# Patient Record
Sex: Male | Born: 1985
Health system: Southern US, Community
[De-identification: ages and names within clinical notes are randomized; demographics above are authoritative.]

## PROBLEM LIST (undated history)

## (undated) DIAGNOSIS — R600 Localized edema: Secondary | ICD-10-CM

## (undated) DIAGNOSIS — E7800 Pure hypercholesterolemia, unspecified: Secondary | ICD-10-CM

## (undated) DIAGNOSIS — E669 Obesity, unspecified: Secondary | ICD-10-CM

## (undated) DIAGNOSIS — I10 Essential (primary) hypertension: Secondary | ICD-10-CM

## (undated) DIAGNOSIS — M109 Gout, unspecified: Secondary | ICD-10-CM

## (undated) DIAGNOSIS — G4733 Obstructive sleep apnea (adult) (pediatric): Secondary | ICD-10-CM

## (undated) DIAGNOSIS — I509 Heart failure, unspecified: Secondary | ICD-10-CM

## (undated) HISTORY — DX: Pure hypercholesterolemia, unspecified: E78.00

## (undated) HISTORY — DX: Obesity, unspecified: E66.9

## (undated) HISTORY — DX: Obstructive sleep apnea (adult) (pediatric): G47.33

## (undated) HISTORY — DX: Gout, unspecified: M10.9

## (undated) HISTORY — PX: HERNIA REPAIR: SHX51

## (undated) HISTORY — DX: Localized edema: R60.0

---
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
## 2005-03-01 ENCOUNTER — Emergency Department (HOSPITAL_COMMUNITY): Admission: EM | Admit: 2005-03-01 | Discharge: 2005-03-01 | Payer: Self-pay | Admitting: Family Medicine

## 2007-07-08 ENCOUNTER — Emergency Department (HOSPITAL_COMMUNITY): Admission: EM | Admit: 2007-07-08 | Discharge: 2007-07-08 | Payer: Self-pay | Admitting: Emergency Medicine

## 2007-10-10 ENCOUNTER — Emergency Department (HOSPITAL_COMMUNITY): Admission: EM | Admit: 2007-10-10 | Discharge: 2007-10-10 | Payer: Self-pay | Admitting: Family Medicine

## 2007-10-13 ENCOUNTER — Encounter: Admission: RE | Admit: 2007-10-13 | Discharge: 2007-10-13 | Payer: Self-pay | Admitting: General Surgery

## 2007-10-26 ENCOUNTER — Ambulatory Visit (HOSPITAL_COMMUNITY): Admission: RE | Admit: 2007-10-26 | Discharge: 2007-10-26 | Payer: Self-pay | Admitting: Urology

## 2008-07-26 ENCOUNTER — Emergency Department (HOSPITAL_COMMUNITY): Admission: EM | Admit: 2008-07-26 | Discharge: 2008-07-26 | Payer: Self-pay | Admitting: Emergency Medicine

## 2008-07-30 ENCOUNTER — Emergency Department (HOSPITAL_COMMUNITY): Admission: EM | Admit: 2008-07-30 | Discharge: 2008-07-30 | Payer: Self-pay | Admitting: Family Medicine

## 2008-08-22 ENCOUNTER — Emergency Department (HOSPITAL_COMMUNITY): Admission: EM | Admit: 2008-08-22 | Discharge: 2008-08-22 | Payer: Self-pay | Admitting: Family Medicine

## 2008-08-24 ENCOUNTER — Encounter (INDEPENDENT_AMBULATORY_CARE_PROVIDER_SITE_OTHER): Payer: Self-pay | Admitting: Adult Health

## 2008-08-24 ENCOUNTER — Ambulatory Visit: Payer: Self-pay | Admitting: Internal Medicine

## 2008-08-24 LAB — CONVERTED CEMR LAB
AST: 26 units/L (ref 0–37)
Chlamydia, Swab/Urine, PCR: NEGATIVE
Creatinine, Ser: 0.95 mg/dL (ref 0.40–1.50)
GC Probe Amp, Urine: NEGATIVE
HCT: 47 % (ref 39.0–52.0)
Hemoglobin: 15.8 g/dL (ref 13.0–17.0)
Lymphocytes Relative: 33 % (ref 12–46)
Lymphs Abs: 2.5 10*3/uL (ref 0.7–4.0)
MCHC: 33.6 g/dL (ref 30.0–36.0)
Monocytes Absolute: 0.4 10*3/uL (ref 0.1–1.0)
Neutrophils Relative %: 61 % (ref 43–77)
RBC: 5.49 M/uL (ref 4.22–5.81)
RDW: 13.2 % (ref 11.5–15.5)
Sodium: 144 meq/L (ref 135–145)
WBC: 7.5 10*3/uL (ref 4.0–10.5)

## 2009-03-03 ENCOUNTER — Emergency Department (HOSPITAL_COMMUNITY): Admission: AC | Admit: 2009-03-03 | Discharge: 2009-03-03 | Payer: Self-pay

## 2010-05-17 ENCOUNTER — Emergency Department (HOSPITAL_COMMUNITY): Admission: EM | Admit: 2010-05-17 | Discharge: 2010-05-17 | Payer: Self-pay | Admitting: Family Medicine
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
## 2011-02-10 LAB — POCT I-STAT, CHEM 8
Calcium, Ion: 1.15 mmol/L (ref 1.12–1.32)
Chloride: 109 meq/L (ref 96–112)
Glucose, Bld: 107 mg/dL — ABNORMAL HIGH (ref 70–99)
HCT: 45 % (ref 39.0–52.0)
Potassium: 3.4 meq/L — ABNORMAL LOW (ref 3.5–5.1)
Sodium: 144 meq/L (ref 135–145)

## 2011-02-10 LAB — TYPE AND SCREEN: Antibody Screen: NEGATIVE

## 2011-02-10 LAB — CBC
Hemoglobin: 14.8 g/dL (ref 13.0–17.0)
RBC: 4.88 MIL/uL (ref 4.22–5.81)
RDW: 14 % (ref 11.5–15.5)
WBC: 6.2 10*3/uL (ref 4.0–10.5)

## 2011-02-10 LAB — ABO/RH: ABO/RH(D): A POS
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
## 2011-07-31 ENCOUNTER — Inpatient Hospital Stay (INDEPENDENT_AMBULATORY_CARE_PROVIDER_SITE_OTHER)
Admission: RE | Admit: 2011-07-31 | Discharge: 2011-07-31 | Disposition: A | Discharge status: Home / Self Care | Payer: Self-pay | Source: Ambulatory Visit | Attending: Family Medicine | Admitting: Family Medicine

## 2011-07-31 ENCOUNTER — Ambulatory Visit (INDEPENDENT_AMBULATORY_CARE_PROVIDER_SITE_OTHER): Payer: Self-pay

## 2011-07-31 DIAGNOSIS — S335XXA Sprain of ligaments of lumbar spine, initial encounter: Secondary | ICD-10-CM

## 2011-08-03 LAB — GC/CHLAMYDIA PROBE AMP, GENITAL: Chlamydia, DNA Probe: NEGATIVE

## 2012-01-20 ENCOUNTER — Encounter (HOSPITAL_COMMUNITY): Payer: Self-pay | Admitting: *Deleted

## 2012-01-20 ENCOUNTER — Emergency Department (INDEPENDENT_AMBULATORY_CARE_PROVIDER_SITE_OTHER)
Admission: EM | Admit: 2012-01-20 | Discharge: 2012-01-20 | Disposition: A | Discharge status: Home / Self Care | Payer: Self-pay | Source: Home / Self Care

## 2012-01-20 DIAGNOSIS — Z202 Contact with and (suspected) exposure to infections with a predominantly sexual mode of transmission: Secondary | ICD-10-CM

## 2012-01-20 MED ORDER — CEFTRIAXONE SODIUM 250 MG IJ SOLR
INTRAMUSCULAR | Status: AC
  Filled 2012-01-20: qty 250

## 2012-01-20 MED ORDER — AZITHROMYCIN 250 MG PO TABS
ORAL_TABLET | ORAL | Status: AC
  Filled 2012-01-20: qty 4

## 2012-01-20 MED ORDER — AZITHROMYCIN 250 MG PO TABS
1000.0000 mg | ORAL_TABLET | Freq: Once | ORAL | Status: AC
  Administered 2012-01-20: 1000 mg via ORAL

## 2012-01-20 MED ORDER — CEFTRIAXONE SODIUM 250 MG IJ SOLR
250.0000 mg | Freq: Once | INTRAMUSCULAR | Status: AC
  Administered 2012-01-20: 250 mg via INTRAMUSCULAR

## 2012-01-20 NOTE — ED Notes (Signed)
Pt  States  He  May  Have  Been  Exposed to  An std  He  denys  Any  Symptoms

## 2012-01-20 NOTE — Discharge Instructions (Signed)
You will be notified if your culture results are abnormal. No sexual activity for one week. Use condoms!  Gonorrhea, Females and Males Gonorrhea is an infection. Gonorrhea can be treated with medicines that kill germs (antibiotics). It is necessary that all your sexual partners also be tested for infection and possibly be treated.  CAUSES  Gonorrhea is caused by a germ (bacteria) called Neisseria gonorrhoeae. This infection is spread by sexual contact. The contact that spreads gonorrhea from person to person may be oral, anal, or genital sex. SYMPTOMS  Females A woman may have gonorrhea infection and no symptoms. The most common symptoms are:  Pain in the lower abdomen.   Fever, with or without chills.  When these are the most serious problems, the illness is commonly called pelvic inflammatory disease (PID). Other symptoms include:  Abnormal vaginal discharge.   Painful intercourse.   Burning or itching of the vagina or lips of the vagina.   Abnormal vaginal bleeding.   Pain when urinating.  If the infection is spread by anal sex:  Irritation, pain, bleeding, or discharge from the rectum.  If the infection is spread by oral sex with either a man or a woman:  Sore throat, fever, and swollen neck lymph glands.  Other problems may include:  Long-lasting (chronic) pain in the lower abdomen during menstruation, intercourse, or at other times.   Inability to become pregnant.   Premature birth.   Passing the infection onto a newborn baby. This can cause an eye infection in the infant or more serious health problems.  Males Less frequently than in women, men may have gonorrhea infection and no symptoms. The most common symptoms are:  Discharge from the penis.   Pain or burning during urination.  If the infection is spread by anal sex:  Irritation, pain, bleeding, or discharge from the rectum.  If the infection is spread by oral sex with either a man or a woman:  Sore  throat, fever, and swollen neck lymph glands.  DIAGNOSIS  Diagnosis is made by exam of the patient and checking a sample of discharge under a microscope for the presence of the bacteria. Discharge may be taken from the urethra, cervix, throat, or rectum. TREATMENT  It is important to diagnose and treat gonorrhea as soon as possible. This prevents damage to the male or male organs or harm to the newborn baby of an infected woman.  Antibiotics are used to treat gonorrhea.   Your sex partners should also be examined and treated if needed.   Testing and treatment for other sexually transmitted diseases (STDs) may be done when you are diagnosed with gonorrhea. Gonorrhea is an STD. You are at risk for other STDs, which are often transmitted around the same time as gonorrhea. These include:   Chlamydia.   Syphilis.   Trichomonas.   Human papillomavirus (HPV).   Human immunodeficiency virus (HIV).   If left untreated, PID can cause women to be unable to have children (sterile). To prevent sterility in females, it is important to be treated as soon as possible and finish all medicines. Unfortunately, sterility or pregnancy occurring outside the uterus (ectopic) may still occur in fully treated women.  HOME CARE INSTRUCTIONS   Finish all medicine as prescribed. Incomplete treatment will put you at risk for continued infection.   Only take over-the-counter or prescription medicines for pain, discomfort, or fever as directed by your caregiver.   Do not have sex until treatment is completed, or as instructed by  your caregiver.   Follow up with your caregiver as directed.   If you test positive for gonorrhea, inform your recent sexual partners. They may need an exam and treatment, even if they have no symptoms. They may need treatment even if they test negative for gonorrhea.  Finding out the results of your test Not all test results are available during your visit. If your test results are  not back during the visit, make an appointment with your caregiver to find out the results. Do not assume everything is normal if you have not heard from your caregiver or the medical facility. It is important for you to follow up on all of your test results. SEEK MEDICAL CARE IF:   You develop any bad reaction to the medicine you were prescribed. This may include:   Rash.   Nausea.   Vomiting.   Diarrhea.   You have an oral temperature above 102 F (38.9 C).   You have symptoms that do not improve, symptoms that get worse, or you develop increased pain. Males may get pain in the testicles and females may get increased abdominal pain.  MAKE SURE YOU:   Understand these instructions.   Will watch your condition.   Will get help right away if you are not doing well or get worse.  Document Released: 10/16/2000 Document Revised: 10/08/2011 Document Reviewed: 02/18/2010 [facility] Patient Information 2012 [place], [place].

## 2012-01-20 NOTE — ED Provider Notes (Signed)
Medical screening examination/treatment/procedure(s) were performed by non-physician practitioner and as supervising physician I was immediately available for consultation/collaboration.   MORENO-COLL,Jahmez Bily; MD   Shailynn Fong Moreno-Coll, MD 01/20/12 2151 

## 2012-01-20 NOTE — ED Provider Notes (Signed)
History     CSN: [id number]  Arrival date & time 01/20/12  1546   None     Chief Complaint  Patient presents with  . Exposure to STD    (Consider location/radiation/quality/duration/timing/severity/associated sxs/prior treatment) HPI Comments: Pt states his girlfriend was treated today by her physician for gonorrhea infection. He presents this evening requesting treatment for gonorrhea also. He denies penile discharge, dysuria or genital lesions. He also denies new sexual partners.    History reviewed. No pertinent past medical history.  History reviewed. No pertinent past surgical history.  History reviewed. No pertinent family history.  History  Substance Use Topics  . Smoking status: Not on file  . Smokeless tobacco: Not on file  . Alcohol Use: Not on file      Review of Systems  Constitutional: Negative for fever.  Genitourinary: Negative for dysuria, discharge, scrotal swelling, genital sores, penile pain and testicular pain.    Allergies  Review of patient's allergies indicates not on file.  Home Medications  No current outpatient prescriptions on file.  BP 151/96  Pulse 90  Temp(Src) 97.1 F (36.2 C) (Oral)  Resp 16  SpO2 96%  Physical Exam  Nursing note and vitals reviewed. Constitutional: He appears well-developed and well-nourished. No distress.  Cardiovascular: Normal rate, regular rhythm and normal heart sounds.   Pulmonary/Chest: Effort normal and breath sounds normal. No respiratory distress.  Genitourinary: Testes normal and penis normal.  Lymphadenopathy:       Right: No inguinal adenopathy present.       Left: No inguinal adenopathy present.  Neurological: He is alert.  Skin: Skin is warm and dry.  Psychiatric: He has a normal mood and affect.    ED Course  Procedures (including critical care time)   Labs Reviewed  GC/CHLAMYDIA PROBE AMP, GENITAL   No results found.   1. Exposure to STD       MDM  Pt reported gonorrhea  exposure. Cx obtained. Treated today based on hx.         Melody Comas, [place] 01/20/12 [phone]

## 2012-12-02 ENCOUNTER — Emergency Department (HOSPITAL_COMMUNITY)
Admission: EM | Admit: 2012-12-02 | Discharge: 2012-12-02 | Disposition: A | Discharge status: Home / Self Care | Payer: Self-pay | Attending: Emergency Medicine | Admitting: Emergency Medicine

## 2012-12-02 ENCOUNTER — Encounter (HOSPITAL_COMMUNITY): Payer: Self-pay | Admitting: Cardiology

## 2012-12-02 DIAGNOSIS — J3489 Other specified disorders of nose and nasal sinuses: Secondary | ICD-10-CM | POA: Insufficient documentation

## 2012-12-02 DIAGNOSIS — R059 Cough, unspecified: Secondary | ICD-10-CM | POA: Insufficient documentation

## 2012-12-02 DIAGNOSIS — J019 Acute sinusitis, unspecified: Secondary | ICD-10-CM | POA: Insufficient documentation

## 2012-12-02 DIAGNOSIS — R42 Dizziness and giddiness: Secondary | ICD-10-CM | POA: Insufficient documentation

## 2012-12-02 DIAGNOSIS — R05 Cough: Secondary | ICD-10-CM | POA: Insufficient documentation

## 2012-12-02 MED ORDER — AMOXICILLIN 875 MG PO TABS: 875.0000 mg | ORAL_TABLET | Freq: Two times a day (BID) | ORAL | Status: DC

## 2012-12-02 NOTE — ED Provider Notes (Signed)
History     CSN: [id number]  Arrival date & time 12/02/12  1348   First MD Initiated Contact with Patient 12/02/12 1402      Chief Complaint  Patient presents with  . Headache  . Dizziness    (Consider location/radiation/quality/duration/timing/severity/associated sxs/prior treatment) HPI Comments: Patient presents today with a chief complaint of headache.  He reports that his headache is located on the right side of his forehead.  Onset of headache was last evening and has been constant since that time.  Onset of headache was gradual.  He reports that he has had nasal congestion, cough, and rhinorrhea associated with the headache.  He denies fever or chills.  Denies vision changes.  No nausea or vomiting.  No photophobia.  No neck pain or stiffness.  He reports that this morning he did have an episode of vertigo, which lasted approximately 2-3 minutes and then resolved.  No dizziness, lightheadedness, or vertigo at this time.  He attempted to take Tylenol Cold and Flu for his symptoms, but does not feel that it helped.  He denies any head trauma.  Patient is a 27 y.o. male presenting with headaches. The history is provided by the patient.  Headache  The quality of the pain is described as dull. The pain does not radiate. Pertinent negatives include no fever, no near-syncope, no syncope, no shortness of breath, no nausea and no vomiting. Treatments tried: Tylenol Cold and Flu. The treatment provided mild relief.    History reviewed. No pertinent past medical history.  Past Surgical History  Procedure Date  . Hernia repair     History reviewed. No pertinent family history.  History  Substance Use Topics  . Smoking status: Never Smoker   . Smokeless tobacco: Not on file  . Alcohol Use: No      Review of Systems  Constitutional: Negative for fever and chills.  HENT: Positive for congestion, rhinorrhea and sinus pressure. Negative for sore throat, neck pain and neck stiffness.    Eyes: Negative for photophobia, pain and visual disturbance.  Respiratory: Positive for cough. Negative for shortness of breath.   Cardiovascular: Negative for chest pain, syncope and near-syncope.  Gastrointestinal: Negative for nausea and vomiting.  Genitourinary: Negative for decreased urine volume.  Neurological: Positive for headaches.  All other systems reviewed and are negative.    Allergies  Review of patient's allergies indicates no known allergies.  Home Medications  No current outpatient prescriptions on file.  BP 153/106  Pulse 107  Temp 97.9 F (36.6 C) (Oral)  Resp 21  SpO2 100%  Physical Exam  Nursing note and vitals reviewed. Constitutional: He is oriented to person, place, and time. He appears well-developed and well-nourished. No distress.  HENT:  Head: Normocephalic and atraumatic.  Right Ear: Tympanic membrane and ear canal normal.  Left Ear: Tympanic membrane and ear canal normal.  Nose: Mucosal edema and rhinorrhea present. Right sinus exhibits frontal sinus tenderness. Right sinus exhibits no maxillary sinus tenderness. Left sinus exhibits no maxillary sinus tenderness and no frontal sinus tenderness.  Mouth/Throat: Uvula is midline, oropharynx is clear and moist and mucous membranes are normal.  Eyes: EOM are normal. Pupils are equal, round, and reactive to light.  Neck: Normal range of motion. Neck supple.  Cardiovascular: Normal rate, regular rhythm and normal heart sounds.   Pulmonary/Chest: Effort normal and breath sounds normal.  Musculoskeletal: Normal range of motion.  Neurological: He is alert and oriented to person, place, and time. He has normal  strength. No cranial nerve deficit or sensory deficit. Coordination and gait normal.       No pronator drift Normal gait, no ataxia Normal finger to nose testing Normal rapid alternating movements.  Skin: Skin is warm and dry. No rash noted. He is not diaphoretic.  Psychiatric: He has a normal  mood and affect.    ED Course  Procedures (including critical care time)  Labs Reviewed - No data to display No results found.   No diagnosis found.  Patient discussed with Dr. Jeraldine Loots.  MDM  Presentation is consistent with Acute Sinusitis and non concerning for [facility], [facility], Meningitis, or temporal arteritis. Pt is afebrile with no focal neuro deficits, nuchal rigidity, or change in vision. Pt given Rx for antibiotic to treat for Sinusitis.  Patient is hypertensive.  Review of the chart shows that the patient has been hypertensive in the past.  Patient has taken a decongestant twice today, which may be increasing his blood pressure further.  Patient instructed to follow up with PCP to have blood pressure rechecked and to determine if he needs to be on antihypertensive medication.  Pt verbalizes understanding and is agreeable with plan to dc.  Strict return precautions given to the patient.          Pascal Lux Parmelee, PA-C 12/03/12 1041

## 2012-12-02 NOTE — ED Notes (Signed)
Pt reports he woke up this morning with a headache and dizziness. Reports he feels like the room is spinning. Reports headache is generalized. No neuro deficits noted at triage. Pt A&Ox4.

## 2012-12-03 NOTE — ED Provider Notes (Signed)
Medical screening examination/treatment/procedure(s) were performed by non-physician practitioner and as supervising physician I was immediately available for consultation/collaboration.   Gerhard Munch, MD 12/03/12 [phone]

## 2013-01-10 ENCOUNTER — Encounter (HOSPITAL_COMMUNITY): Payer: Self-pay | Admitting: Emergency Medicine

## 2013-01-10 ENCOUNTER — Emergency Department (HOSPITAL_COMMUNITY)
Admission: EM | Admit: 2013-01-10 | Discharge: 2013-01-10 | Disposition: A | Discharge status: Home / Self Care | Payer: Self-pay | Attending: Emergency Medicine | Admitting: Emergency Medicine

## 2013-01-10 ENCOUNTER — Emergency Department (HOSPITAL_COMMUNITY)
Admission: EM | Admit: 2013-01-10 | Discharge: 2013-01-10 | Discharge status: Against Medical Advice | Payer: No Typology Code available for payment source | Attending: Emergency Medicine | Admitting: Emergency Medicine

## 2013-01-10 ENCOUNTER — Encounter (HOSPITAL_COMMUNITY): Payer: Self-pay | Admitting: Physical Medicine and Rehabilitation

## 2013-01-10 DIAGNOSIS — R51 Headache: Secondary | ICD-10-CM | POA: Insufficient documentation

## 2013-01-10 MED ORDER — HYDROCODONE-ACETAMINOPHEN 5-325 MG PO TABS: 1.0000 | ORAL_TABLET | Freq: Three times a day (TID) | ORAL | Status: DC | PRN

## 2013-01-10 NOTE — ED Notes (Signed)
[facility] ED called and patient is checking in over there at this time.

## 2013-01-10 NOTE — ED Notes (Signed)
States that he was on his way to work and starting a headache.

## 2013-01-10 NOTE — ED Provider Notes (Signed)
History  This chart was scribed for non-physician practitioner working with Hilario Quarry, MD, by Candelaria Stagers, ED Scribe. This patient was seen in room WTR6/WTR6 and the patient's care was started at 6:59 PM   CSN: [id number]  Arrival date & time 01/10/13  1657   First MD Initiated Contact with Patient 01/10/13 1831      Chief Complaint  Patient presents with  . Headache     The history is provided by the patient. No language interpreter was used.   Steven Chase is a 27 y.o. male who presents to the Emergency Department complaining of a constant throbbing headache that started earlier today while getting ready for work earlier this morning.  Pt reports the headache has improved mildly.  He denies gait problems, neck pain, vision changes, fever, chills, nausea, or vomiting.  Pt took ibuprofen about 12 hours ago with little relief.  Pt was listening to music on headphones which was audible upon examination.     History reviewed. No pertinent past medical history.  Past Surgical History  Procedure Laterality Date  . Hernia repair      No family history on file.  History  Substance Use Topics  . Smoking status: Never Smoker   . Smokeless tobacco: Not on file  . Alcohol Use: No      Review of Systems  Eyes: Negative for visual disturbance.  Musculoskeletal: Negative for gait problem.  Neurological: Positive for headaches.  All other systems reviewed and are negative.    Allergies  Review of patient's allergies indicates no known allergies.  Home Medications   Current Outpatient Rx  Name  Route  Sig  Dispense  Refill  . ibuprofen (ADVIL,MOTRIN) 200 MG tablet   Oral   Take 400 mg by mouth every 6 (six) hours as needed for pain.         Marland Kitchen HYDROcodone-acetaminophen (NORCO/VICODIN) 5-325 MG per tablet   Oral   Take 1 tablet by mouth every 8 (eight) hours as needed for pain.   6 tablet   0     BP 160/104  Pulse 104  Temp(Src) 98.6 F (37 C) (Oral)   Resp 22  SpO2 93%  Physical Exam  Nursing note and vitals reviewed. Constitutional: He is oriented to person, place, and time. He appears well-developed and well-nourished. No distress.  HENT:  Head: Normocephalic and atraumatic.  Right Ear: Tympanic membrane normal.  Left Ear: Tympanic membrane normal.  Eyes: EOM are normal. Pupils are equal, round, and reactive to light.  Neck: Normal range of motion. Neck supple. No tracheal deviation present.  Cardiovascular: Normal rate.   Pulmonary/Chest: Effort normal. No respiratory distress.  Musculoskeletal: Normal range of motion.  Lymphadenopathy:    He has no cervical adenopathy.  Neurological: He is alert and oriented to person, place, and time. He has normal strength. No cranial nerve deficit or sensory deficit. He displays a negative Romberg sign.  Skin: Skin is warm and dry.  Psychiatric: He has a normal mood and affect. His behavior is normal.    ED Course  Procedures   DIAGNOSTIC STUDIES: Oxygen Saturation is 93% on room air, normal by my interpretation.    COORDINATION OF CARE:  7:04 PM Discussed course of care with pt which includes pain medication and referral to headache clinic.  Pt understands and agrees.  Pt request a work note.      Labs Reviewed - No data to display No results found.   1. Headache  MDM  No concerning signs for meningitis, SAH, trauma, stroke. All of these etiologies were considered. Pts headache is mild and did get significantly better before coming using Ibuprofen at 7am this morning.  Pt has been advised of the symptoms that warrant their return to the ED. Patient has voiced understanding and has agreed to follow-up with the PCP or specialist.   I personally performed the services described in this documentation, which was scribed in my presence. The recorded information has been reviewed and is accurate.        Dorthula Matas, PA-C 01/12/13 [phone]

## 2013-01-10 NOTE — ED Notes (Signed)
Pt presents to department for evaluation of headache and dizziness. Onset this afternoon. Pt states he felt sharp pain in head and then became dizzy. Pt is alert and oriented x4. No neurological deficits noted. 6/10 pain at the time. No nausea/vomiting.

## 2013-01-17 NOTE — ED Provider Notes (Signed)
History/physical exam/procedure(s) were performed by non-physician practitioner and as supervising physician I was immediately available for consultation/collaboration. I have reviewed all notes and am in agreement with care and plan.   Hilario Quarry, MD 01/17/13 1048

## 2013-07-24 ENCOUNTER — Encounter (HOSPITAL_COMMUNITY): Payer: Self-pay | Admitting: *Deleted

## 2013-07-24 ENCOUNTER — Emergency Department (HOSPITAL_COMMUNITY)
Admission: EM | Admit: 2013-07-24 | Discharge: 2013-07-24 | Disposition: A | Discharge status: Home / Self Care | Payer: Self-pay | Attending: Emergency Medicine | Admitting: Emergency Medicine

## 2013-07-24 DIAGNOSIS — I10 Essential (primary) hypertension: Secondary | ICD-10-CM | POA: Insufficient documentation

## 2013-07-24 DIAGNOSIS — R0981 Nasal congestion: Secondary | ICD-10-CM

## 2013-07-24 DIAGNOSIS — R519 Headache, unspecified: Secondary | ICD-10-CM

## 2013-07-24 DIAGNOSIS — R51 Headache: Secondary | ICD-10-CM | POA: Insufficient documentation

## 2013-07-24 DIAGNOSIS — J3489 Other specified disorders of nose and nasal sinuses: Secondary | ICD-10-CM | POA: Insufficient documentation

## 2013-07-24 LAB — CBC
HCT: 41.7 % (ref 39.0–52.0)
Hemoglobin: 15.1 g/dL (ref 13.0–17.0)
MCH: 30.1 pg (ref 26.0–34.0)
MCHC: 36.2 g/dL — ABNORMAL HIGH (ref 30.0–36.0)
Platelets: 271 10*3/uL (ref 150–400)
RDW: 13 % (ref 11.5–15.5)
WBC: 10 10*3/uL (ref 4.0–10.5)

## 2013-07-24 LAB — BASIC METABOLIC PANEL
CO2: 27 mEq/L (ref 19–32)
Calcium: 9.6 mg/dL (ref 8.4–10.5)
Creatinine, Ser: 0.92 mg/dL (ref 0.50–1.35)
GFR calc non Af Amer: >90 mL/min (ref >90)
Potassium: 3.5 mEq/L (ref 3.5–5.1)

## 2013-07-24 MED ORDER — CETIRIZINE-PSEUDOEPHEDRINE ER 5-120 MG PO TB12: 1.0000 | ORAL_TABLET | Freq: Two times a day (BID) | ORAL | Status: DC | PRN

## 2013-07-24 MED ORDER — HYDROCHLOROTHIAZIDE 12.5 MG PO CAPS
12.5000 mg | ORAL_CAPSULE | Freq: Once | ORAL | Status: AC
  Administered 2013-07-24: 12.5 mg via ORAL
  Filled 2013-07-24: qty 1

## 2013-07-24 MED ORDER — AMLODIPINE BESYLATE 5 MG PO TABS: 5.0000 mg | ORAL_TABLET | Freq: Every day | ORAL | Status: DC

## 2013-07-24 MED ORDER — CETIRIZINE HCL 5 MG/5ML PO SYRP
5.0000 mg | ORAL_SOLUTION | Freq: Once | ORAL | Status: AC
  Administered 2013-07-24: 5 mg via ORAL
  Filled 2013-07-24: qty 5

## 2013-07-24 MED ORDER — HYDROCODONE-ACETAMINOPHEN 5-325 MG PO TABS
2.0000 | ORAL_TABLET | Freq: Once | ORAL | Status: AC
  Administered 2013-07-24: 2 via ORAL
  Filled 2013-07-24: qty 2

## 2013-07-24 MED ORDER — HYDROCHLOROTHIAZIDE 12.5 MG PO TABS: 12.5000 mg | ORAL_TABLET | Freq: Every day | ORAL | Status: DC

## 2013-07-24 MED ORDER — AMLODIPINE BESYLATE 5 MG PO TABS
5.0000 mg | ORAL_TABLET | Freq: Once | ORAL | Status: AC
  Administered 2013-07-24: 5 mg via ORAL
  Filled 2013-07-24: qty 1

## 2013-07-24 NOTE — ED Provider Notes (Signed)
CSN: [id number]     Arrival date & time 07/24/13  4098 History   First MD Initiated Contact with Patient 07/24/13 1003     Chief Complaint  Patient presents with  . Headache  . Nasal Congestion   (Consider location/radiation/quality/duration/timing/severity/associated sxs/prior Treatment) Patient is a 27 y.o. male presenting with headaches. The history is provided by the patient.  Headache Associated symptoms: congestion   Associated symptoms: no abdominal pain, no back pain, no dizziness, no pain, no fever, no nausea, no neck pain, no neck stiffness, no numbness, no sore throat and no vomiting   pt c/o dull frontal headache for the past day. Gradual onset. Constant. Slowly worse. No acute or abrupt change since onset. Onset at rest, not associated w any specific activity. No recent trauma or injury. Similar to prior headaches, but more persistent. Also notes nasal/sinus congestion. No puruelnt drainage or fevers. Pts pain/headache is located in region frontal sinuses. No neck pain or stiffness. No eye pain or change in vision. Speech normal. No numbness/weakness. No problems w balance or coordination. Denies hx chronic headaches or migraines, but notes occasional headache in past.      History reviewed. No pertinent past medical history. Past Surgical History  Procedure Laterality Date  . Hernia repair     No family history on file. History  Substance Use Topics  . Smoking status: Never Smoker   . Smokeless tobacco: Never Used  . Alcohol Use: No    Review of Systems  Constitutional: Negative for fever and chills.  HENT: Positive for congestion. Negative for sore throat, neck pain and neck stiffness.   Eyes: Negative for pain, redness and visual disturbance.  Respiratory: Negative for shortness of breath.   Cardiovascular: Negative for chest pain.  Gastrointestinal: Negative for nausea, vomiting and abdominal pain.  Genitourinary: Negative for flank pain.  Musculoskeletal:  Negative for back pain.  Skin: Negative for rash.  Neurological: Positive for headaches. Negative for dizziness, weakness, light-headedness and numbness.  Hematological: Does not bruise/bleed easily.  Psychiatric/Behavioral: Negative for confusion.    Allergies  Review of patient's allergies indicates no known allergies.  Home Medications   Current Outpatient Rx  Name  Route  Sig  Dispense  Refill  . HYDROcodone-acetaminophen (NORCO/VICODIN) 5-325 MG per tablet   Oral   Take 1 tablet by mouth every 8 (eight) hours as needed for pain.   6 tablet   0   . ibuprofen (ADVIL,MOTRIN) 200 MG tablet   Oral   Take 400 mg by mouth every 6 (six) hours as needed for pain.          BP 163/114  Pulse 109  Temp(Src) 97.6 F (36.4 C) (Oral)  Resp 18  SpO2 97% Physical Exam  Nursing note and vitals reviewed. Constitutional: He is oriented to person, place, and time. He appears well-developed and well-nourished. No distress.  HENT:  Head: Atraumatic.  Mouth/Throat: Oropharynx is clear and moist.  Mild nasal congestion.   No temporal or sinus tenderness.     Eyes: Conjunctivae and EOM are normal. Pupils are equal, round, and reactive to light. No scleral icterus.  Neck: Normal range of motion. Neck supple. No tracheal deviation present.  No stiffness or rigidity  Cardiovascular: Normal rate, regular rhythm, normal heart sounds and intact distal pulses.   Pulmonary/Chest: Effort normal and breath sounds normal. No accessory muscle usage. No respiratory distress.  Abdominal: Soft. He exhibits no distension. There is no tenderness.  Musculoskeletal: Normal range of motion. He  exhibits no edema and no tenderness.  Neurological: He is alert and oriented to person, place, and time. No cranial nerve deficit.  Motor intact bil. Steady gait.   Skin: Skin is warm and dry. He is not diaphoretic.  Psychiatric: He has a normal mood and affect.    ED Course  Procedures (including critical  care time)  Results for orders placed during the hospital encounter of 07/24/13  BASIC METABOLIC PANEL      Result Value Range   Sodium 135  135 - 145 mEq/L   Potassium 3.5  3.5 - 5.1 mEq/L   Chloride 98  96 - 112 mEq/L   CO2 27  19 - 32 mEq/L   Glucose, Bld 104 (*) 70 - 99 mg/dL   BUN 8  6 - 23 mg/dL   Creatinine, Ser 1.47  0.50 - 1.35 mg/dL   Calcium 9.6  8.4 - 82.9 mg/dL   GFR calc non Af Amer >90  >90 mL/min   GFR calc Af Amer >90  >90 mL/min  CBC      Result Value Range   WBC 10.0  4.0 - 10.5 K/uL   RBC 5.01  4.22 - 5.81 MIL/uL   Hemoglobin 15.1  13.0 - 17.0 g/dL   HCT 56.2  13.0 - 86.5 %   MCV 83.2  78.0 - 100.0 fL   MCH 30.1  26.0 - 34.0 pg   MCHC 36.2 (*) 30.0 - 36.0 g/dL   RDW 78.4  69.6 - 29.5 %   Platelets 271  150 - 400 K/uL     MDM  Pt hypertensive in ED. Pt denies hx same. Labs.  On review prior ED visits, pt also noted to have elevated bp then.  Pt states he did not drive here, has ride. No meds for pain pta. vicodin po.  Pt notes sinus congestion. Will give rx zyrtec d.   Given that bp not only v high today, but also on several prior occasions, will give rx, amlodipine 5 mg po daily and hctz 12.5 mg po.   Discussed importance close pcp follow up, incl for recheck bp w pt.   Recheck resting comfortably. No headache. Hr 88 rr 16, bp 170/105.       Suzi Roots, MD 07/24/13 1247

## 2013-07-24 NOTE — ED Notes (Signed)
Pt states since yesterday has had a severe headache and nasal congestion, states has taken tylenol and "percocet g" OTC medication.

## 2013-08-24 ENCOUNTER — Emergency Department (HOSPITAL_COMMUNITY): Payer: No Typology Code available for payment source

## 2013-08-24 ENCOUNTER — Encounter (HOSPITAL_COMMUNITY): Payer: Self-pay | Admitting: Emergency Medicine

## 2013-08-24 ENCOUNTER — Emergency Department (HOSPITAL_COMMUNITY)
Admission: EM | Admit: 2013-08-24 | Discharge: 2013-08-24 | Disposition: A | Discharge status: Home / Self Care | Payer: No Typology Code available for payment source | Attending: Emergency Medicine | Admitting: Emergency Medicine

## 2013-08-24 DIAGNOSIS — I10 Essential (primary) hypertension: Secondary | ICD-10-CM | POA: Insufficient documentation

## 2013-08-24 DIAGNOSIS — IMO0002 Reserved for concepts with insufficient information to code with codable children: Secondary | ICD-10-CM | POA: Insufficient documentation

## 2013-08-24 DIAGNOSIS — Y9389 Activity, other specified: Secondary | ICD-10-CM | POA: Insufficient documentation

## 2013-08-24 DIAGNOSIS — Z79899 Other long term (current) drug therapy: Secondary | ICD-10-CM | POA: Insufficient documentation

## 2013-08-24 DIAGNOSIS — M549 Dorsalgia, unspecified: Secondary | ICD-10-CM

## 2013-08-24 DIAGNOSIS — Y92410 Unspecified street and highway as the place of occurrence of the external cause: Secondary | ICD-10-CM | POA: Insufficient documentation

## 2013-08-24 HISTORY — DX: Essential (primary) hypertension: I10

## 2013-08-24 MED ORDER — METHOCARBAMOL 500 MG PO TABS: 500.0000 mg | ORAL_TABLET | Freq: Two times a day (BID) | ORAL | Status: DC

## 2013-08-24 MED ORDER — NAPROXEN 500 MG PO TABS: 500.0000 mg | ORAL_TABLET | Freq: Two times a day (BID) | ORAL | Status: DC

## 2013-08-24 NOTE — ED Provider Notes (Signed)
Medical screening examination/treatment/procedure(s) were performed by non-physician practitioner and as supervising physician I was immediately available for consultation/collaboration.  EKG Interpretation   None         Kristen N Ward, DO 08/24/13 2013 
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
## 2013-08-24 NOTE — ED Notes (Signed)
MD at bedside. 
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
## 2013-12-20 ENCOUNTER — Encounter (HOSPITAL_COMMUNITY): Payer: Self-pay | Admitting: Emergency Medicine

## 2013-12-20 ENCOUNTER — Emergency Department (HOSPITAL_COMMUNITY)
Admission: EM | Admit: 2013-12-20 | Discharge: 2013-12-20 | Disposition: A | Discharge status: Home / Self Care | Payer: No Typology Code available for payment source | Attending: Emergency Medicine | Admitting: Emergency Medicine

## 2013-12-20 DIAGNOSIS — R05 Cough: Secondary | ICD-10-CM

## 2013-12-20 DIAGNOSIS — R51 Headache: Secondary | ICD-10-CM | POA: Insufficient documentation

## 2013-12-20 DIAGNOSIS — R519 Headache, unspecified: Secondary | ICD-10-CM

## 2013-12-20 DIAGNOSIS — R0981 Nasal congestion: Secondary | ICD-10-CM

## 2013-12-20 DIAGNOSIS — Z79899 Other long term (current) drug therapy: Secondary | ICD-10-CM | POA: Insufficient documentation

## 2013-12-20 DIAGNOSIS — Z791 Long term (current) use of non-steroidal anti-inflammatories (NSAID): Secondary | ICD-10-CM | POA: Insufficient documentation

## 2013-12-20 DIAGNOSIS — R059 Cough, unspecified: Secondary | ICD-10-CM | POA: Insufficient documentation

## 2013-12-20 DIAGNOSIS — I10 Essential (primary) hypertension: Secondary | ICD-10-CM | POA: Insufficient documentation

## 2013-12-20 DIAGNOSIS — J3489 Other specified disorders of nose and nasal sinuses: Secondary | ICD-10-CM | POA: Insufficient documentation

## 2013-12-20 MED ORDER — HYDROCHLOROTHIAZIDE 12.5 MG PO CAPS
12.5000 mg | ORAL_CAPSULE | Freq: Once | ORAL | Status: AC
  Administered 2013-12-20: 12.5 mg via ORAL
  Filled 2013-12-20: qty 1

## 2013-12-20 MED ORDER — AMLODIPINE BESYLATE 5 MG PO TABS
5.0000 mg | ORAL_TABLET | Freq: Once | ORAL | Status: AC
  Administered 2013-12-20: 5 mg via ORAL
  Filled 2013-12-20: qty 1

## 2013-12-20 MED ORDER — CETIRIZINE HCL 5 MG/5ML PO SYRP
5.0000 mg | ORAL_SOLUTION | Freq: Once | ORAL | Status: AC
  Administered 2013-12-20: 5 mg via ORAL
  Filled 2013-12-20: qty 5

## 2013-12-20 MED ORDER — ACETAMINOPHEN 500 MG PO TABS
1000.0000 mg | ORAL_TABLET | Freq: Once | ORAL | Status: AC
  Administered 2013-12-20: 1000 mg via ORAL
  Filled 2013-12-20: qty 2

## 2013-12-20 MED ORDER — CETIRIZINE HCL 1 MG/ML PO SYRP: 5.0000 mg | ORAL_SOLUTION | Freq: Every day | ORAL | Status: DC

## 2013-12-20 MED ORDER — AMLODIPINE BESYLATE 5 MG PO TABS: 5.0000 mg | ORAL_TABLET | Freq: Every day | ORAL | Status: DC

## 2013-12-20 MED ORDER — SALINE SPRAY 0.65 % NA SOLN: 1.0000 | NASAL | Status: DC | PRN

## 2013-12-20 MED ORDER — HYDROCHLOROTHIAZIDE 12.5 MG PO TABS: 12.5000 mg | ORAL_TABLET | Freq: Every day | ORAL | Status: DC

## 2013-12-20 NOTE — ED Notes (Signed)
Called for patient out in lobby. No answer will try again in 5 mins.

## 2013-12-20 NOTE — ED Provider Notes (Signed)
CSN: [id number]     Arrival date & time 12/20/13  0919 History   First MD Initiated Contact with Patient 12/20/13 1007     Chief Complaint  Patient presents with  . Cough  . Headache     (Consider location/radiation/quality/duration/timing/severity/associated sxs/prior Treatment) HPI Pt is a 28yo male with hx of HTN c/o cough, congestion and headache that started yesterday. Headache is sore, on both sides of his head, feels like a typical headache, gradual in onset, 6/10.  Pt states he has been trying tylenol w/o relief. Also reports productive cough with yellow sputum that started yesterday. Denies fever, n/v/d, body aches. Denies chest pain or SOB. Denies sick contacts or recent travel. Pt states he does not have any blood pressure medication or other daily medication that he takes.    Past Medical History  Diagnosis Date  . Hypertension    Past Surgical History  Procedure Laterality Date  . Hernia repair     No family history on file. History  Substance Use Topics  . Smoking status: Never Smoker   . Smokeless tobacco: Never Used  . Alcohol Use: No    Review of Systems  Constitutional: Negative for fever and chills.  HENT: Positive for congestion. Negative for ear pain, sore throat and trouble swallowing.   Respiratory: Positive for cough. Negative for shortness of breath.   Cardiovascular: Negative for chest pain.  Gastrointestinal: Negative for nausea, vomiting, abdominal pain, diarrhea and constipation.  Neurological: Positive for headaches. Negative for dizziness and light-headedness.  All other systems reviewed and are negative.      Allergies  Review of patient's allergies indicates no known allergies.  Home Medications   Current Outpatient Rx  Name  Route  Sig  Dispense  Refill  . acetaminophen (TYLENOL) 325 MG tablet   Oral   Take 650 mg by mouth every 6 (six) hours as needed for pain.         Marland Kitchen. amLODipine (NORVASC) 5 MG tablet   Oral   Take 1  tablet (5 mg total) by mouth daily.   30 tablet   0   . cetirizine (ZYRTEC) 1 MG/ML syrup   Oral   Take 5 mLs (5 mg total) by mouth daily.   118 mL   12   . hydrochlorothiazide (HYDRODIURIL) 12.5 MG tablet   Oral   Take 1 tablet (12.5 mg total) by mouth daily.   30 tablet   0   . methocarbamol (ROBAXIN) 500 MG tablet   Oral   Take 1 tablet (500 mg total) by mouth 2 (two) times daily.   20 tablet   0   . naproxen (NAPROSYN) 500 MG tablet   Oral   Take 1 tablet (500 mg total) by mouth 2 (two) times daily.   30 tablet   0   . sodium chloride (OCEAN) 0.65 % SOLN nasal spray   Each Nare   Place 1 spray into both nostrils as needed for congestion.   1 Bottle   0    BP 166/106  Pulse 114  Temp(Src) 97.9 F (36.6 C) (Oral)  Resp 16  SpO2 98% Physical Exam  Nursing note and vitals reviewed. Constitutional: He appears well-developed and well-nourished.  HENT:  Head: Normocephalic and atraumatic.  Right Ear: Hearing, tympanic membrane, external ear and ear canal normal.  Left Ear: Hearing, tympanic membrane, external ear and ear canal normal.  Nose: Mucosal edema and rhinorrhea present.  Mouth/Throat: Uvula is midline, oropharynx is clear  and moist and mucous membranes are normal.  Eyes: Conjunctivae are normal. No scleral icterus.  Neck: Normal range of motion.  Cardiovascular: Normal rate, regular rhythm and normal heart sounds.   Pulmonary/Chest: Effort normal and breath sounds normal. No respiratory distress. He has no wheezes. He has no rales. He exhibits no tenderness.  Abdominal: Soft. Bowel sounds are normal. He exhibits no distension and no mass. There is no tenderness. There is no rebound and no guarding.  Musculoskeletal: Normal range of motion.  Neurological: He is alert.  Skin: Skin is warm and dry.    ED Course  Procedures (including critical care time) Labs Review Labs Reviewed - No data to display Imaging Review No results found.  EKG  Interpretation   None       MDM   Final diagnoses:  Headache  Cough  Nasal congestion  HTN (hypertension)    Pt presenting with cough, congestion, and headache that started yesterday.  Headache feels like a typical headache, was gradual in onset, 5/10.  Pt appears well, non-toxic, afebrile, no respiratory distress. NAD. Lungs: CTAB. Do not believe further workup needed at this time, including no CXR or labs needed.  Will tx symptomatically with zyrtec, acetaminophen, and nasal saline.   Pt also presented with elevated BP of 181/117 at triage.  Pt does have hx of HTN but does not have a PCP, states he does not have any daily medications.  According to pt's medical records, pt was given amlodipine and hdrochlorothiazide in 07/2013 but pt states he has not gotten any refills on his medication since then.  Will recheck BP before discharge and advise pt to f/u with a PCP, resource guide provided. Discussed importance of follow up with blood pressure as he should be on a daily blood pressure medication.  Rx: amlodipine and hydrochlorothiazide refilled today. Return precautions provided. Pt verbalized understanding and agreement with tx plan.   Junius Finner, PA-C 12/20/13 1036

## 2013-12-20 NOTE — Discharge Instructions (Signed)
Today you were seen for cough, congestion, and headache. You were also found to have elevated blood pressure. It is important to take all blood pressure medication as prescribed and follow up with a primary care provider next week for a recheck of your blood pressure as well as to establish care with a primary care provider for ongoing healthcare needs, including medication refills.  Refer to resource guide below to find a primary care provider.  You may use the ocean nasal spray to help clear nasal congestion as well as take zyrtec to help with congestion.    Emergency Department Resource Guide 1) Find a Doctor and Pay Out of Pocket Although you won't have to find out who is covered by your insurance plan, it is a good idea to ask around and get recommendations. You will then need to call the office and see if the doctor you have chosen will accept you as a new patient and what types of options they offer for patients who are self-pay. Some doctors offer discounts or will set up payment plans for their patients who do not have insurance, but you will need to ask so you aren't surprised when you get to your appointment.  2) Contact Your Local Health Department Not all health departments have doctors that can see patients for sick visits, but many do, so it is worth a call to see if yours does. If you don't know where your local health department is, you can check in your phone book. The CDC also has a tool to help you locate your state's health department, and many state websites also have listings of all of their local health departments.  3) Find a Walk-in Clinic If your illness is not likely to be very severe or complicated, you may want to try a walk in clinic. These are popping up all over the country in pharmacies, drugstores, and shopping centers. They're usually staffed by nurse practitioners or physician assistants that have been trained to treat common illnesses and complaints. They're usually  fairly quick and inexpensive. However, if you have serious medical issues or chronic medical problems, these are probably not your best option.  No Primary Care Doctor: - Call Health Connect at  [phone] - they can help you locate a primary care doctor that  accepts your insurance, provides certain services, etc. - Physician Referral Service- [phone]  Chronic Pain Problems: Organization         Address  Phone   Notes  [facility]  [phone] Patients need to be referred by their primary care doctor.   Medication Assistance: Organization         Address  Phone   Notes  [facility] [address] [place], [place] [address] [phone] --Must be a resident of [facility] [place] -- Must have NO insurance coverage whatsoever (no Medicaid/ Medicare, etc.) -- The pt. MUST have a primary care doctor that directs their care regularly and follows them in the community   [facility]  [phone]   [employer]  [phone]    Agencies that provide inexpensive medical care: Organization         Address  Phone                                                                            Notes  [facility]  [phone]   [facility]    [phone]   [facility] [address] [place], [place] [address] [phone]   [facility] [address] [place], [place] [phone]   [facility]anned Parenthood    [phone]   [facility]    [phone]   Community Health and [facility]  [address], [place] Phone:  [phone], Fax:  [phone] Hours of Operation:  9 am - 6 pm, M-F.  Also accepts Medicaid/Medicare and self-pay.  [facility]  [address], [place] Phone: [phone], Fax: [phone]. Hours of Operation:   8:30 am - 5:30 pm, M-F.  Also accepts Medicaid and self-pay.  [facility] [place] [address], [place] Phone: [phone]   [facility] [address] Natasha Bence [place], [place] [phone], Ext. [phone] Mondays & Thursdays: 7-9 AM.  First 15 patients are seen on a first come, first serve basis.    Medicaid-accepting [facility] Providers:  Organization         Address                                                                       Phone                               Notes  [facility] [address], [place] [phone] Also accepts self-pay patients.  [facility] [address] Laurell Josephs [place], [place]  [phone]   [facility] [address], [place] [phone]   [facility] [address] [place] [phone]   Renaye Rakers [address], [place]   [phone] Only accepts [place] patients after they have their name applied to their card.   Self-Pay (no insurance) in [facility]:   Organization         Address                                                     Phone               Notes  Sickle Cell Patients, [facility] [address] [place], [place] [phone]   [facility] [address] [place], [place] [phone]   [facility]  [address], [place] [phone]   [facility]/Dr. Osei-Bonsu  [address] [place] or [address], [place] [phone] Phone number for both [place] and [place] locations is the same.  [facility]gent [facility] and [facility] [address] [place] [phone]   [facility] [address] [place] or [address] [phone]   [facility] [address] [place] [phone],  phone; [phone], fax Sees patients 1st and 3rd Saturday of every month.  Must not qualify for public or private insurance (i.e. Medicaid, Medicare, [place] Health Choice, Veterans' Benefits)  Household income should be no more than 200% of the poverty level The clinic cannot treat you if you are pregnant or think you are pregnant  Sexually transmitted diseases are not treated at the clinic.    Dental Care: Organization         Address                                  Phone                       Notes  [facility] Department of [facility] [address] [place], [place] [phone] Accepts children up to age 39 who are enrolled in [place] or [place] Health Choice; pregnant women with a Medicaid card; and children who have applied for Medicaid or [place] Health Choice, but were declined, whose parents can pay a reduced fee at time of service.  [facility] Department of [facility]  [address], [place] [phone] Accepts children up to age 69 who are enrolled in [place] or [place] Health Choice; pregnant women with a Medicaid card; and children who have applied for Medicaid or [place] Health Choice, but were declined, whose parents can pay a reduced fee at time of service.  [facility]  [address] [place], [place] [phone] Patients are seen by appointment only. Walk-ins are not accepted. [facility] will see patients 44 years of age and older. Monday - Tuesday (8am-5pm) Most Wednesdays (8:30-5pm) $30 per visit, cash only  [facility]  [address], [facility] [phone] Patients are seen by appointment only. Walk-ins are not accepted. [facility] will see patients 45 years of age and older. One Wednesday Evening (Monthly: Volunteer Based).  $30 per visit, cash only  [employer]  [phone] for adults; Children under age 58, call Graduate Pediatric  Dentistry at [phone]. Children aged 85-14, please call [phone] to request a pediatric application.  Dental services are provided in all areas of dental care including fillings, crowns and bridges, complete and partial dentures, implants, gum treatment, root canals, and extractions. Preventive care is also provided. Treatment is provided to both adults and children. Patients are selected via a lottery and there is often a waiting list.   [facility] [address], [place]  [phone] [email or web address]   [facility] [address] [place], [place] [phone], Ext. [phone] Second and Fourth Thursday of each month, opens at 6:30 AM; Clinic ends at 9 AM.  Patients are seen on a first-come first-served basis, and a limited number are seen during each clinic.   [facility]  [address], [place], [place] [phone]   Eligibility Requirements You must have lived in [place], [place], or [place] counties for at least the last three months.   You cannot be eligible for state or federal sponsored [place] Cityhealthcare insurance, including [employer]IG[employer]eterans Administration, [place], or [employer]icare.   You generally cannot be eligible for healthcare insurance through your employer.    How to apply: Eligibility screenings are held every Tuesday and Wednesday afternoon from 1:00 pm until 4:00 pm. You do not need an appointment for the interview!  [facility] [address], [place], [place] [phone]   [facility]  [phone]   [facility]  [phone]   [facility]  [phone]

## 2013-12-20 NOTE — ED Notes (Signed)
Spoke with patient concerning the importance of a primary care provider, patient verbalized understanding.

## 2013-12-20 NOTE — Progress Notes (Signed)
[facility] provided pt with a list of primary care resources and [facility] [place] Card application to help patient establish primary care.  °

## 2013-12-20 NOTE — ED Notes (Signed)
Patient presents today with a chief complaint of productive cough with yellow sputum and frontal headache since yesterday. Patient reports taking tylenol for the headache without relief. Patient denies sore throat, shortness of breath, fevers, chills, body aches, nausea/vomiting.

## 2013-12-21 NOTE — ED Provider Notes (Signed)
Medical screening examination/treatment/procedure(s) were performed by non-physician practitioner and as supervising physician I was immediately available for consultation/collaboration.  EKG Interpretation   None         Audree Camel, MD 12/21/13 1102

## 2014-02-03 ENCOUNTER — Emergency Department (HOSPITAL_COMMUNITY): Payer: Self-pay

## 2014-02-03 ENCOUNTER — Emergency Department (HOSPITAL_COMMUNITY)
Admission: EM | Admit: 2014-02-03 | Discharge: 2014-02-03 | Disposition: A | Discharge status: Home / Self Care | Payer: Self-pay | Attending: Emergency Medicine | Admitting: Emergency Medicine

## 2014-02-03 ENCOUNTER — Encounter (HOSPITAL_COMMUNITY): Payer: Self-pay | Admitting: Emergency Medicine

## 2014-02-03 DIAGNOSIS — I10 Essential (primary) hypertension: Secondary | ICD-10-CM | POA: Insufficient documentation

## 2014-02-03 DIAGNOSIS — G479 Sleep disorder, unspecified: Secondary | ICD-10-CM | POA: Insufficient documentation

## 2014-02-03 DIAGNOSIS — R51 Headache: Secondary | ICD-10-CM | POA: Insufficient documentation

## 2014-02-03 LAB — URINALYSIS, ROUTINE W REFLEX MICROSCOPIC
Bilirubin Urine: NEGATIVE
GLUCOSE, UA: NEGATIVE mg/dL
KETONES UR: NEGATIVE mg/dL
LEUKOCYTES UA: NEGATIVE
NITRITE: NEGATIVE
PROTEIN: 30 mg/dL — AB
Specific Gravity, Urine: 1.021 (ref 1.005–1.030)
UROBILINOGEN UA: 0.2 mg/dL (ref 0.0–1.0)
pH: 7 (ref 5.0–8.0)

## 2014-02-03 LAB — RAPID URINE DRUG SCREEN, HOSP PERFORMED
Amphetamines: NOT DETECTED
BENZODIAZEPINES: NOT DETECTED
Barbiturates: NOT DETECTED
COCAINE: NOT DETECTED
OPIATES: NOT DETECTED
Tetrahydrocannabinol: NOT DETECTED

## 2014-02-03 LAB — URINE MICROSCOPIC-ADD ON

## 2014-02-03 MED ORDER — AMLODIPINE BESYLATE 10 MG PO TABS: 10.0000 mg | ORAL_TABLET | Freq: Every day | ORAL | Status: DC

## 2014-02-03 MED ORDER — HYDROCHLOROTHIAZIDE 25 MG PO TABS: 25.0000 mg | ORAL_TABLET | Freq: Every day | ORAL | Status: DC

## 2014-02-03 MED ORDER — HYDROCHLOROTHIAZIDE 25 MG PO TABS
25.0000 mg | ORAL_TABLET | Freq: Once | ORAL | Status: AC
  Administered 2014-02-03: 25 mg via ORAL
  Filled 2014-02-03: qty 1

## 2014-02-03 MED ORDER — AMLODIPINE BESYLATE 10 MG PO TABS
10.0000 mg | ORAL_TABLET | Freq: Once | ORAL | Status: AC
  Administered 2014-02-03: 10 mg via ORAL
  Filled 2014-02-03: qty 1

## 2014-02-03 NOTE — ED Provider Notes (Signed)
CSN: [id number]     Arrival date & time 02/03/14  0815 History   First MD Initiated Contact with Patient 02/03/14 [phone]     Chief Complaint  Patient presents with  . uncontrollable sleeping during the day      (Consider location/radiation/quality/duration/timing/severity/associated sxs/prior Treatment) HPI Comments: Patient is a 28 year old male with history of hypertension who presents today for evaluation of increased somnolence. He reports that over the past one to 2 months he has been sleeping more. He sleeps from 8 PM to 5 AM. He reports that he wakes approximately once during the night to urinate and initially feels rested. He drinks five-hour energy and soda during the day. He reports that this seems to help, but anytime he sits still he falls asleep. He feels like this acutely worsened on Monday. He has not been taking any medication for his blood pressure. He reports that he was given a 30 day supply which has run out. He denies headache, dizziness, lightheadedness, paresthesias, numbness, weakness, blurry vision, double vision, photophobia, nausea, vomiting, abdominal pain. Patient denies taking any other medications. He denies any drug or alcohol use.  The history is provided by the patient. No language interpreter was used.    Past Medical History  Diagnosis Date  . Hypertension    Past Surgical History  Procedure Laterality Date  . Hernia repair     No family history on file. History  Substance Use Topics  . Smoking status: Never Smoker   . Smokeless tobacco: Never Used  . Alcohol Use: No    Review of Systems  Constitutional: Negative for fever and chills.  Respiratory: Negative for shortness of breath.   Cardiovascular: Negative for chest pain.  Gastrointestinal: Negative for nausea, vomiting and abdominal pain.  Neurological: Negative for dizziness, light-headedness and headaches.  Psychiatric/Behavioral: Positive for sleep disturbance.  All other systems reviewed  and are negative.      Allergies  Review of patient's allergies indicates no known allergies.  Home Medications  No current outpatient prescriptions on file. BP 168/130  Pulse 116  Temp(Src) 97.7 F (36.5 C) (Oral)  Resp 15  SpO2 100% Physical Exam  Nursing note and vitals reviewed. Constitutional: He is oriented to person, place, and time. He appears well-developed and well-nourished. He does not appear ill. No distress.  Patient is somnolent, but easy to arouse. Patient snores loudly on exam.  HENT:  Head: Normocephalic and atraumatic.  Right Ear: External ear normal.  Left Ear: External ear normal.  Nose: Nose normal.  Eyes: Conjunctivae and EOM are normal.  Neck: Normal range of motion. No tracheal deviation present.  No nuchal rigidity or meningeal signs  Cardiovascular: Normal rate, regular rhythm, normal heart sounds, intact distal pulses and normal pulses.   Pulmonary/Chest: Effort normal and breath sounds normal. No stridor.  Abdominal: Soft. He exhibits no distension. There is no tenderness.  Musculoskeletal: Normal range of motion.  Neurological: He is alert and oriented to person, place, and time. He has normal strength. Coordination normal.  Finger-nose-finger normal. Grip strength 5 out of 5 bilaterally. No facial droop.  Skin: Skin is warm and dry. He is not diaphoretic.  Psychiatric: He has a normal mood and affect. His behavior is normal.    ED Course  Procedures (including critical care time) Labs Review Labs Reviewed  URINALYSIS, ROUTINE W REFLEX MICROSCOPIC - Abnormal; Notable for the following:    Hgb urine dipstick MODERATE (*)    Protein, ur 30 (*)  All other components within normal limits  URINE CULTURE  URINE RAPID DRUG SCREEN (HOSP PERFORMED)  URINE MICROSCOPIC-ADD ON   Imaging Review Ct Head Wo Contrast  02/03/2014   CLINICAL DATA:  Headache  EXAM: CT HEAD WITHOUT CONTRAST  TECHNIQUE: Contiguous axial images were obtained from the base  of the skull through the vertex without intravenous contrast.  COMPARISON:  None.  FINDINGS: No skull fracture is noted. There is mucosal thickening with nodular appearance bilateral maxillary sinus probable mucous retention cysts. The mastoid air cells are unremarkable.  No intracranial hemorrhage, mass effect or midline shift. No acute infarction. No hydrocephalus. The gray and white-matter differentiation is preserved. No mass lesion is noted on this unenhanced scan.  IMPRESSION: No acute intracranial abnormality. Mucosal thickening with nodular appearance bilateral maxillary sinus.   Electronically Signed   By: Natasha Mead M.D.   On: 02/03/2014 10:07     EKG Interpretation None      MDM   Final diagnoses:  Sleep disturbance  Hypertension    Patient presents emergency department for evaluation of increased somnolence. Will obtain head CT and urine drug screen. Neuro exam is without deficit. Patient has no other complaints.   Head CT without acute abnormality, UDS negative. Patient does decrease oxygen saturations when he sleeps, but I feel this is likely chronic. Patient needs a sleep study. He was given a referral [facility]. He states he will follow up. Patient was given his original hypertensive medications in the ED and an rx was written for home. He was given a referral to the Health and [facility] as well as a Facilities manager. Discussed limiting salt and decreasing caffeine intake. Discussed reasons to return to the ED immediately. Vital signs stable for discharge. Discussed case with Dr. Karma Ganja who agrees with plan. Patient / Family / Caregiver informed of clinical course, understand medical decision-making process, and agree with plan.   Mora Bellman, PA-C 02/03/14 1316

## 2014-02-03 NOTE — ED Notes (Signed)
PT reports inability to stay awake during the day with onset of symptoms 1-2 months ago. Occurs daily during conversations and friends will wake him if he falls asleep. PT has tried vitamin supplements and energy drinks to stay awake and they have not helped. PT currently sleeps 8p-5a during the week -this is a change from 2 mos ago where he slept 10-5a. PT denies HA, n/v/d. PT A&Ox4 but has fallen asleep during our conversation (easy to wake). HTN dx in January and was given BP pills which he took for 30 days. Current BP 177/124.

## 2014-02-03 NOTE — ED Notes (Signed)
PT intermittently falls asleep and his O2 sats drop from 98 down to 75-89% on 3 L [place] (irregular RR, snoring, mouth breathing when asleep)

## 2014-02-03 NOTE — ED Notes (Signed)
PT placed on 3 L [place] for sats dropping to 83% while sleeping.  Pt remains hypertensive.

## 2014-02-03 NOTE — ED Provider Notes (Signed)
Medical screening examination/treatment/procedure(s) were performed by non-physician practitioner and as supervising physician I was immediately available for consultation/collaboration.   EKG Interpretation None       Ethelda Chick, MD 02/03/14 1324

## 2014-02-03 NOTE — Discharge Instructions (Signed)
Please call the sleep center listed above to schedule and appointment as soon as possible. Additionally your blood pressure was very high today. Please take these medications as prescribed and follow up with a primary care physician. Please return to the ED with new or worsening symptoms.   Hypersomnia Hypersomnia usually brings recurrent episodes of excessive daytime sleepiness or prolonged nighttime sleep. It is different than feeling tired due to lack of or interrupted sleep at night. People with hypersomnia are compelled to nap repeatedly during the day. This is often at inappropriate times such as:  At work.  During a meal.  In conversation. These daytime naps usually provide no relief. This disorder typically affects adolescents and young adults. CAUSES  This condition may be caused by:  Another sleep disorder (such as narcolepsy or sleep apnea).  Dysfunction of the autonomic nervous system.  Drug or alcohol abuse.  A physical problem, such as:  A tumor.  Head trauma. This is damage caused by an accident.  Injury to the central nervous system.  Certain medications, or medicine withdrawal.  Medical conditions may contribute to the disorder, including:  Multiple sclerosis.  Depression.  Encephalitis.  Epilepsy.  Obesity.  Some people appear to have a genetic predisposition to this disorder. In others, there is no known cause. SYMPTOMS   Patients often have difficulty waking from a long sleep. They may feel dazed or confused.  Other symptoms may include:  Anxiety.  Increased irritation (inflammation).  Decreased energy.  Restlessness.  Slow thinking.  Slow speech.  Loss of appetite.  Hallucinations.  Memory difficulty.  Tremors, Tics.  Some patients lose the ability to function in family, social, occupational, or other settings. TREATMENT  Treatment is symptomatic in nature. Stimulants and other drugs may be used to treat this  disorder. Changes in behavior may help. For example, avoid night work and social activities that delay bed time. Changes in diet may offer some relief. Patients should avoid alcohol and caffeine. PROGNOSIS  The likely outcome (prognosis) for persons with hypersomnia depends on the cause of the disorder. The disorder itself is not life threatening. But it can have serious consequences. For example, automobile accidents can be caused by falling asleep while driving. The attacks usually continue indefinitely. Document Released: 10/09/2002 Document Revised: 01/11/2012 Document Reviewed: 09/12/2008 [name] Patient Information 2014 [place], [place].   Arterial Hypertension Arterial hypertension (high blood pressure) is a condition of elevated pressure in your blood vessels. Hypertension over a long period of time is a risk factor for strokes, heart attacks, and heart failure. It is also the leading cause of kidney (renal) failure.  CAUSES   In Adults -- Over 90% of all hypertension has no known cause. This is called essential or primary hypertension. In the other 10% of people with hypertension, the increase in blood pressure is caused by another disorder. This is called secondary hypertension. Important causes of secondary hypertension are:  Heavy alcohol use.  Obstructive sleep apnea.  Hyperaldosterosim (Conn's syndrome).  Steroid use.  Chronic kidney failure.  Hyperparathyroidism.  Medications.  Renal artery stenosis.  Pheochromocytoma.  Cushing's disease.  Coarctation of the aorta.  Scleroderma renal crisis.  Licorice (in excessive amounts).  Drugs (cocaine, methamphetamine). Your caregiver can explain any items above that apply to you.  In Children -- Secondary hypertension is more common and should always be considered.  Pregnancy -- Few women of childbearing age have high blood pressure. However, up to 10% of them develop hypertension of pregnancy. Generally, this will  not harm the woman. It may be a sign of 3 complications of pregnancy: preeclampsia, HELLP syndrome, and eclampsia. Follow up and control with medication is necessary. SYMPTOMS   This condition normally does not produce any noticeable symptoms. It is usually found during a routine exam.  Malignant hypertension is a late problem of high blood pressure. It may have the following symptoms:  Headaches.  Blurred vision.  End-organ damage (this means your kidneys, heart, lungs, and other organs are being damaged).  Stressful situations can increase the blood pressure. If a person with normal blood pressure has their blood pressure go up while being seen by their caregiver, this is often termed "white coat hypertension." Its importance is not known. It may be related with eventually developing hypertension or complications of hypertension.  Hypertension is often confused with mental tension, stress, and anxiety. DIAGNOSIS  The diagnosis is made by 3 separate blood pressure measurements. They are taken at least 1 week apart from each other. If there is organ damage from hypertension, the diagnosis may be made without repeat measurements. Hypertension is usually identified by having blood pressure readings:  Above 140/90 mmHg measured in both arms, at 3 separate times, over a couple weeks.  Over 130/80 mmHg should be considered a risk factor and may require treatment in patients with diabetes. Blood pressure readings over 120/80 mmHg are called "pre-hypertension" even in non-diabetic patients. To get a true blood pressure measurement, use the following guidelines. Be aware of the factors that can alter blood pressure readings.  Take measurements at least 1 hour after caffeine.  Take measurements 30 minutes after smoking and without any stress. This is another reason to quit smoking  it raises your blood pressure.  Use a proper cuff size. Ask your caregiver if you are not sure about your cuff  size.  Most home blood pressure cuffs are automatic. They will measure systolic and diastolic pressures. The systolic pressure is the pressure reading at the start of sounds. Diastolic pressure is the pressure at which the sounds disappear. If you are elderly, measure pressures in multiple postures. Try sitting, lying or standing.  Sit at rest for a minimum of 5 minutes before taking measurements.  You should not be on any medications like decongestants. These are found in many cold medications.  Record your blood pressure readings and review them with your caregiver. If you have hypertension:  Your caregiver may do tests to be sure you do not have secondary hypertension (see "causes" above).  Your caregiver may also look for signs of metabolic syndrome. This is also called Syndrome X or Insulin Resistance Syndrome. You may have this syndrome if you have type 2 diabetes, abdominal obesity, and abnormal blood lipids in addition to hypertension.  Your caregiver will take your medical and family history and perform a physical exam.  Diagnostic tests may include blood tests (for glucose, cholesterol, potassium, and kidney function), a urinalysis, or an EKG. Other tests may also be necessary depending on your condition. PREVENTION  There are important lifestyle issues that you can adopt to reduce your chance of developing hypertension:  Maintain a normal weight.  Limit the amount of salt (sodium) in your diet.  Exercise often.  Limit alcohol intake.  Get enough potassium in your diet. Discuss specific advice with your caregiver.  Follow a DASH diet (dietary approaches to stop hypertension). This diet is rich in fruits, vegetables, and low-fat dairy products, and avoids certain fats. PROGNOSIS  Essential hypertension cannot be cured.  Lifestyle changes and medical treatment can lower blood pressure and reduce complications. The prognosis of secondary hypertension depends on the underlying  cause. Many people whose hypertension is controlled with medicine or lifestyle changes can live a normal, healthy life.  RISKS AND COMPLICATIONS  While high blood pressure alone is not an illness, it often requires treatment due to its short- and long-term effects on many organs. Hypertension increases your risk for:  CVAs or strokes (cerebrovascular accident).  Heart failure due to chronically high blood pressure (hypertensive cardiomyopathy).  Heart attack (myocardial infarction).  Damage to the retina (hypertensive retinopathy).  Kidney failure (hypertensive nephropathy). Your caregiver can explain list items above that apply to you. Treatment of hypertension can significantly reduce the risk of complications. TREATMENT   For overweight patients, weight loss and regular exercise are recommended. Physical fitness lowers blood pressure.  Mild hypertension is usually treated with diet and exercise. A diet rich in fruits and vegetables, fat-free dairy products, and foods low in fat and salt (sodium) can help lower blood pressure. Decreasing salt intake decreases blood pressure in a 1/3 of people.  Stop smoking if you are a smoker. The steps above are highly effective in reducing blood pressure. While these actions are easy to suggest, they are difficult to achieve. Most patients with moderate or severe hypertension end up requiring medications to bring their blood pressure down to a normal level. There are several classes of medications for treatment. Blood pressure pills (antihypertensives) will lower blood pressure by their different actions. Lowering the blood pressure by 10 mmHg may decrease the risk of complications by as much as 25%. The goal of treatment is effective blood pressure control. This will reduce your risk for complications. Your caregiver will help you determine the best treatment for you according to your lifestyle. What is excellent treatment for one person, may not be for  you. HOME CARE INSTRUCTIONS   Do not smoke.  Follow the lifestyle changes outlined in the "Prevention" section.  If you are on medications, follow the directions carefully. Blood pressure medications must be taken as prescribed. Skipping doses reduces their benefit. It also puts you at risk for problems.  Follow up with your caregiver, as directed.  If you are asked to monitor your blood pressure at home, follow the guidelines in the "Diagnosis" section above. SEEK MEDICAL CARE IF:   You think you are having medication side effects.  You have recurrent headaches or lightheadedness.  You have swelling in your ankles.  You have trouble with your vision. SEEK IMMEDIATE MEDICAL CARE IF:   You have sudden onset of chest pain or pressure, difficulty breathing, or other symptoms of a heart attack.  You have a severe headache.  You have symptoms of a stroke (such as sudden weakness, difficulty speaking, difficulty walking). MAKE SURE YOU:   Understand these instructions.  Will watch your condition.  Will get help right away if you are not doing well or get worse. Document Released: 10/19/2005 Document Revised: 01/11/2012 Document Reviewed: 05/19/2007 [facility] Patient Information 2014 [place], [place].   Emergency Department Resource Guide 1) Find a Doctor and Pay Out of Pocket Although you won't have to find out who is covered by your insurance plan, it is a good idea to ask around and get recommendations. You will then need to call the office and see if the doctor you have chosen will accept you as a new patient and what types of options they offer for patients who are self-pay. Some  doctors offer discounts or will set up payment plans for their patients who do not have insurance, but you will need to ask so you aren't surprised when you get to your appointment.  2) Contact Your Local Health Department Not all health departments have doctors that can see patients for sick visits,  but many do, so it is worth a call to see if yours does. If you don't know where your local health department is, you can check in your phone book. The CDC also has a tool to help you locate your state's health department, and many state websites also have listings of all of their local health departments.  3) Find a Walk-in Clinic If your illness is not likely to be very severe or complicated, you may want to try a walk in clinic. These are popping up all over the country in pharmacies, drugstores, and shopping centers. They're usually staffed by nurse practitioners or physician assistants that have been trained to treat common illnesses and complaints. They're usually fairly quick and inexpensive. However, if you have serious medical issues or chronic medical problems, these are probably not your best option.  No Primary Care Doctor: - Call Health Connect at  [phone] - they can help you locate a primary care doctor that  accepts your insurance, provides certain services, etc. - Physician Referral Service- [phone]  Chronic Pain Problems: Organization         Address  Phone   Notes  [facility]  [phone] Patients need to be referred by their primary care doctor.   Medication Assistance: Organization         Address  Phone   Notes  [facility] Medication [facility] [address] [place], [place] [address] [phone]) [phone] --Must be a resident of [facility] [place] -- Must have NO insurance coverage whatsoever (no Medicaid/ Medicare, etc.) -- The pt. MUST have a primary care doctor that directs their care regularly and follows them in the community   [facility]ist  [phone]   [employer]  [phone]    Agencies that provide inexpensive medical care: Organization         Address  Phone   Notes  [facility]  [phone]) [phone]   [facility]    [phone]   [facility] [address] [place], [place] [address] [phone]   [facility] [address], [place] [phone]) [phone]   [facility]    [phone]) [phone]   [facility]    [phone]   [facility]  [address], [place] Phone:  [phone], Fax:  [phone] Hours of Operation:  9 am - 6 pm, M-F.  Also accepts Medicaid/Medicare and self-pay.  [name]  [address], [place] Phone: [phone], Fax: [phone]. Hours of Operation:  8:30 am - 5:30 pm, M-F.  Also accepts Medicaid and self-pay.  [facility] [place] [address], [place] Phone: [phone]) [phone]   [facility] [address], [place], [place] [phone], Ext. [phone] Mondays & Thursdays: 7-9 AM.  First 15 patients are seen on a first come, first serve basis.    Medicaid-accepting [facility] Providers:  Organization         Address  Phone   Notes  [facility] [address], [place] [phone] Also accepts self-pay patients.  [facility] [address] Laurell Josephs [place], [place]  [phone]   [facility] [address], [place] [phone]   [facility] [address], [place] [phone]   Renaye Rakers [address], [place]   [phone] Only accepts [place] Access [place] patients after they have their name applied to their card.   Self-Pay (no insurance) in [facility]:  Organization         Address  Phone   Notes  Sickle Cell Patients, [facility] [address] [place], [place] [phone]   [facility] [address] [place], [place] [phone]   [facility] [place]  [address], [place] [phone]   [facility]/Dr. Osei-Bonsu  [address]  [place] or [address], [place] [phone] Phone number for both [place] and [place] locations is the same.  [facility] [address], [place] [phone]   [facility] [address], [place] or [address] [phone]   [facility] [address] [place] [phone], phone; [phone], fax Sees patients 1st and 3rd Saturday of every month.  Must not qualify for public or private insurance (i.e. Medicaid, Medicare, [place] Health Choice, Veterans' Benefits)  Household income should be no more than 200% of the poverty level The clinic cannot treat you if you are pregnant or think you are pregnant  Sexually transmitted diseases are not treated at the clinic.    Dental Care: Organization         Address  Phone  Notes  [facility] [address] [place], [place] [phone] Accepts children up to age 31 who are enrolled in [place] or [place] Health Choice; pregnant women with a Medicaid card; and children who have applied for Medicaid or [place] Health Choice, but were declined, whose parents can pay a reduced fee at time of service.  [facility] Department of [facility]  [address], [place] [phone] Accepts children up to age 74 who are enrolled in [place] or [place] Health Choice; pregnant women with a Medicaid card; and children who have applied for Medicaid or [place] Health Choice, but were declined, whose parents can pay a reduced fee at time of service.  [facility]  [address] [place], [place] [phone] Patients are seen by appointment only. Walk-ins are not accepted. [facility] will see patients 53 years of age and older. Monday - Tuesday (8am-5pm) Most Wednesdays (8:30-5pm) $30 per visit, cash only  [facility]  [address]  Dr, [facility] [phone] Patients are seen by appointment only. Walk-ins are not accepted. [facility] will see patients 109 years of age and older. One Wednesday Evening (Monthly: Volunteer Based).  $30 per visit, cash only  [employer]  [phone] for adults; Children under age 61, call Graduate Pediatric Dentistry at [phone]. Children aged 40-14, please call [phone] to request a pediatric application.  Dental services are provided in all areas of dental care including fillings, crowns and bridges, complete and partial dentures, implants, gum treatment, root canals, and extractions. Preventive  care is also provided. Treatment is provided to both adults and children. Patients are selected via a lottery and there is often a waiting list.   [facility] [address] [place]  [phone] [email or web address]   [facility] [address] [place], [place] [phone], Ext. [phone] Second and Fourth Thursday of each month, opens at 6:30 AM; Clinic ends at 9 AM.  Patients are seen on a first-come first-served basis, and a limited number are seen during each clinic.   [facility]  [address] Ether Griffins [place], [place] [phone]   Eligibility Requirements You must have lived in [place], [place], or [place] counties for at least the last three months.   You cannot be eligible for state or federal sponsored [place], including [employer], [place], or [employer].   You generally cannot be eligible for healthcare insurance through your employer.    How to apply: Eligibility screenings are held every Tuesday and Wednesday afternoon from 1:00 pm until 4:00 pm. You do not need an appointment for the interview!  [facility] [address], [place], [place] [phone]   [facility] Health Department  [phone]   [facility] Health Department  [phone]   [facility] Health Department  [phone]    Behavioral Health Resources in the Community: Intensive Outpatient Programs Organization         Address  Phone  Notes  [facility] [address], [place], [place] [phone]   [facility] [address] [place], [place] [phone]   [facility] [address] [place], [place]  [phone]   [facility] [address],  [place], [place] [phone] or [phone]   Substance Abuse Resources Organization         Address  Phone  Notes  [facility]  [phone]   [facility]  [phone]   [facility]  [phone]   Floydene Flock  [phone]   Residential & Outpatient Substance Abuse Program  [phone]   Psychological Services Organization         Address  Phone  Notes  [facility]  [phone]   [facility]  [phone]   [facility] [address], [place] [phone] or [phone]    Mobile Crisis Teams Organization         Address  Phone  Notes  [facility], Mobile Crisis Care Unit  [phone]   [facility]  [address] [place], [place] [phone]   Doristine Locks [address] [place] [phone]    Self-Help/Support Groups Organization         Address  Phone             Notes  [facility] [place] - variety of support groups  [phone]- I7437963 Call for more information  Narcotics Anonymous (NA), [facility] [address], [employer] [place]  2 meetings at this location   Statistician         Address  Phone  Notes  [facility] [address],    [place]  [phone]   [facility]  [address] [place] [address], [place], [place] [phone]   [facility] [address] [place], [place]  [phone] Admissions: 8am-3pm M-F  [facility] [address]  [place], [place]   [facility] [address] Jadene Pierini [place], [place]   The [place].,  [place], [place]   [facility] - Intensive Outpatient [address], [place], [place]   [facility] [address]  [place], [place] [phone] or [phone]   [facility] ([facility]) [address] [place], [place] Accepts Medicaid  [facility] [place] [address],  [place] [phone] Substance Abuse/Addiction Treatment   [facility] Organization         Address  Phone  Notes  [facility]  [phone]   Domenic Schwab, PhD [address] Arlis Porta [place], [place]   [phone] or [phone]   [facility] [place], [place] [phone]   [facility] [address], [place], [place] [phone] Insurance/Medicaid/sponsorship through [facility] and Families [address] [place] [address]                                    [place], [place] [phone] [place] [address], [place] [phone]    Dr. Adele Schilder  [phone]   [facility] [place] Dept. 1) [address], [place] 2) [place] 3)  [place] [address], [place] [phone]  [phone]   [place] [phone] or [phone] (After Hours)

## 2014-02-04 LAB — URINE CULTURE: Colony Count: 3000

## 2014-07-30 ENCOUNTER — Encounter (HOSPITAL_COMMUNITY): Payer: Self-pay | Admitting: Emergency Medicine

## 2014-07-30 ENCOUNTER — Emergency Department (HOSPITAL_COMMUNITY)
Admission: EM | Admit: 2014-07-30 | Discharge: 2014-07-30 | Disposition: A | Discharge status: Home / Self Care | Payer: No Typology Code available for payment source | Attending: Emergency Medicine | Admitting: Emergency Medicine

## 2014-07-30 DIAGNOSIS — R51 Headache: Secondary | ICD-10-CM | POA: Insufficient documentation

## 2014-07-30 DIAGNOSIS — Z79899 Other long term (current) drug therapy: Secondary | ICD-10-CM | POA: Insufficient documentation

## 2014-07-30 DIAGNOSIS — I10 Essential (primary) hypertension: Secondary | ICD-10-CM

## 2014-07-30 DIAGNOSIS — R519 Headache, unspecified: Secondary | ICD-10-CM

## 2014-07-30 MED ORDER — LISINOPRIL 10 MG PO TABS
10.0000 mg | ORAL_TABLET | Freq: Once | ORAL | Status: AC
  Administered 2014-07-30: 10 mg via ORAL
  Filled 2014-07-30: qty 1

## 2014-07-30 MED ORDER — SODIUM CHLORIDE 0.9 % IV BOLUS (SEPSIS)
1000.0000 mL | Freq: Once | INTRAVENOUS | Status: AC
  Administered 2014-07-30: 1000 mL via INTRAVENOUS

## 2014-07-30 MED ORDER — METOCLOPRAMIDE HCL 5 MG/ML IJ SOLN
10.0000 mg | Freq: Once | INTRAMUSCULAR | Status: AC
Start: 2014-07-30 — End: 2014-07-30
  Administered 2014-07-30: 10 mg via INTRAVENOUS
  Filled 2014-07-30: qty 2

## 2014-07-30 MED ORDER — DIPHENHYDRAMINE HCL 50 MG/ML IJ SOLN
25.0000 mg | Freq: Once | INTRAMUSCULAR | Status: AC
  Administered 2014-07-30: 25 mg via INTRAVENOUS
  Filled 2014-07-30: qty 1

## 2014-07-30 MED ORDER — LISINOPRIL 10 MG PO TABS: 10.0000 mg | ORAL_TABLET | Freq: Every day | ORAL | Status: DC

## 2014-07-30 MED ORDER — KETOROLAC TROMETHAMINE 30 MG/ML IJ SOLN
30.0000 mg | Freq: Once | INTRAMUSCULAR | Status: AC
  Administered 2014-07-30: 30 mg via INTRAVENOUS
  Filled 2014-07-30: qty 1

## 2014-07-30 NOTE — ED Notes (Signed)
Per pt sts woke up with HA and nausea this am. Hx of same

## 2014-07-30 NOTE — ED Provider Notes (Signed)
CSN: [id number]     Arrival date & time 07/30/14  0844 History   First MD Initiated Contact with Patient 07/30/14 1144     Chief Complaint  Patient presents with  . Headache     (Consider location/radiation/quality/duration/timing/severity/associated sxs/prior Treatment) HPI Comments: The patient is a 28 year old male with uncontrolled hypertension, headaches, presents emergency room chief complaint of headache since this morning. The patient reports waking up due to a frontal headache. He reports constant pain, non-radiating. He denies aggravating or relieving symptoms.  Reports typical headaches as temporal, worsened by stress.  Denies visual disturbances, lightheadedness, paresthesias, nausea, vomiting. No pain therapy prior to arrival. He reports history of hypertension, last medication approximately one month ago. The patient denies chest pain, shortness of breath, decrease in urine output, lower tremor the edema, palpitations. NO PCP  Patient is a 28 y.o. male presenting with headaches. The history is provided by the patient. No language interpreter was used.  Headache Associated symptoms: no abdominal pain, no congestion, no fever, no nausea, no numbness, no photophobia and no vomiting     Past Medical History  Diagnosis Date  . Hypertension    Past Surgical History  Procedure Laterality Date  . Hernia repair     History reviewed. No pertinent family history. History  Substance Use Topics  . Smoking status: Never Smoker   . Smokeless tobacco: Never Used  . Alcohol Use: No    Review of Systems  Constitutional: Negative for fever and chills.  HENT: Negative for congestion.   Eyes: Negative for photophobia and visual disturbance.  Respiratory: Negative for shortness of breath.   Cardiovascular: Negative for chest pain, palpitations and leg swelling.  Gastrointestinal: Negative for nausea, vomiting and abdominal pain.  Genitourinary: Negative for decreased urine volume.   Neurological: Positive for headaches. Negative for weakness, light-headedness and numbness.      Allergies  Review of patient's allergies indicates no known allergies.  Home Medications   Prior to Admission medications   Medication Sig Start Date End Date Taking? Authorizing Provider  amLODipine (NORVASC) 10 MG tablet Take 1 tablet (10 mg total) by mouth daily. 02/03/14  Yes Hannah S Merrell, PA-C   BP 164/122  Pulse 119  Temp(Src) 98 F (36.7 C) (Oral)  Resp 24  Ht 5\' 6"  (1.676 m)  Wt 230 lb (104.327 kg)  BMI 37.14 kg/m2  SpO2 98% Physical Exam  Nursing note and vitals reviewed. Constitutional: He is oriented to person, place, and time. He appears well-developed and well-nourished. No distress.  HENT:  Head: Normocephalic and atraumatic.  Eyes: EOM are normal. Pupils are equal, round, and reactive to light.  Neck: Neck supple.  Cardiovascular: Normal rate and regular rhythm.   Pulmonary/Chest: Effort normal and breath sounds normal. He has no wheezes. He has no rales.  Abdominal: Soft. There is no tenderness. There is no rebound and no guarding.  Musculoskeletal: Normal range of motion.  Neurological: He is alert and oriented to person, place, and time.  II-Visual fields were normal.   III/IV/VI-Pupils were equal and reacted. Extraocular movements were full and conjugate.  V/VII-no facial droop.   VIII-normal.   Motor: Left extremities 5/5. Right extremities 5/5 Sensory: normal sensation to upper and lower extremities.  Cerebellar: Normal finger to nose bilaterally  Skin: Skin is warm and dry. He is not diaphoretic.  Psychiatric: He has a normal mood and affect. His behavior is normal.    ED Course  Procedures (including critical care time) Labs Review Labs  Reviewed - No data to display  Imaging Review No results found.   EKG Interpretation None      MDM   Final diagnoses:  Acute intractable headache, unspecified headache type  Hypertension,  uncontrolled   Patient history of hypertension, uncontrolled, presents with frontal headache. No meningismus signs. No neurologic deficits on exam, afebrile. Treatment with headache cocktail. Reevaluate. Discussed importance of blood pressure control and followup.  Dr. Hyacinth Meeker also evaluated the patient during exam and agrees with BP control and symptom control. Reevaluation patient resting comfortably in room reports mild resolution of symptoms, Reglan and Benadryl ordered. Case management discuss resources with patient and followup with wellness center, patient states he will schedule his own appointment with the wellness center. Reevaluation patient sleeping in room, reports moderate resolution of symptoms, requesting discharge home. Discussed treatment plan with the patient. Return precautions given. Reports understanding and no other concerns at this time.  Patient is stable for discharge at this time. Meds given in ED:  Medications  sodium chloride 0.9 % bolus 1,000 mL (1,000 mLs Intravenous New Bag/Given 07/30/14 1234)  lisinopril (PRINIVIL,ZESTRIL) tablet 10 mg (10 mg Oral Given 07/30/14 1235)  ketorolac (TORADOL) 30 MG/ML injection 30 mg (30 mg Intravenous Given 07/30/14 1235)  metoCLOPramide (REGLAN) injection 10 mg (10 mg Intravenous Given 07/30/14 1324)  diphenhydrAMINE (BENADRYL) injection 25 mg (25 mg Intravenous Given 07/30/14 1324)    New Prescriptions   LISINOPRIL (PRINIVIL,ZESTRIL) 10 MG TABLET    Take 1 tablet (10 mg total) by mouth daily.       Mellody Drown, PA-C 07/30/14 1443

## 2014-07-30 NOTE — ED Provider Notes (Signed)
28 year old male, history of hypertension, no medications in over one month, was being treated with Norvasc but states he did not have any refills and the medication was extensive. He complains of a headache which is frontal in location, awoke with this headache this morning, states it is somewhat similar to prior headaches and is not the worst headache of his life. He has no other associated pathologic symptoms including no fevers, chills, stiff neck, numbness, weakness or change in vision. On exam the patient has clear heart and lung sounds, pulse of 98 on my exam, normal lung sounds, soft abdomen, no peripheral edema and normal speech, normal cranial nerves III through XII, normal strength and sensation of all 4 extremities. His blood pressure is elevated, we'll change medication to lisinopril, I discussed with the patient at length the indications for its use as well as the side effects including angioedema, he is in agreement with starting this medication and is aware of the possible side effects. Pain medication for the headache.  Medical screening examination/treatment/procedure(s) were conducted as a shared visit with non-physician practitioner(s) and myself.  I personally evaluated the patient during the encounter.  Clinical Impression:   Final diagnoses:  Acute intractable headache, unspecified headache type  Hypertension, uncontrolled         Vida Roller, MD 07/30/14 [phone]

## 2014-07-30 NOTE — ED Notes (Signed)
Case Manager at bedside

## 2014-07-30 NOTE — Discharge Instructions (Signed)
Call for a follow up appointment with a Family or Primary Care Provider for further management of your blood pressure. Return if Symptoms worsen.   Take medication as prescribed.     Emergency Department Resource Guide 1) Find a Doctor and Pay Out of Pocket Although you won't have to find out who is covered by your insurance plan, it is a good idea to ask around and get recommendations. You will then need to call the office and see if the doctor you have chosen will accept you as a new patient and what types of options they offer for patients who are self-pay. Some doctors offer discounts or will set up payment plans for their patients who do not have insurance, but you will need to ask so you aren't surprised when you get to your appointment.  2) Contact Your Local Health Department Not all health departments have doctors that can see patients for sick visits, but many do, so it is worth a call to see if yours does. If you don't know where your local health department is, you can check in your phone book. The CDC also has a tool to help you locate your state's health department, and many state websites also have listings of all of their local health departments.  3) Find a Walk-in Clinic If your illness is not likely to be very severe or complicated, you may want to try a walk in clinic. These are popping up all over the country in pharmacies, drugstores, and shopping centers. They're usually staffed by nurse practitioners or physician assistants that have been trained to treat common illnesses and complaints. They're usually fairly quick and inexpensive. However, if you have serious medical issues or chronic medical problems, these are probably not your best option.  No Primary Care Doctor: - Call Health Connect at  [phone] - they can help you locate a primary care doctor that  accepts your insurance, provides certain services, etc. - Physician Referral Service- [phone]  Chronic Pain  Problems: Organization         Address  Phone   Notes  [facility]  [phone] Patients need to be referred by their primary care doctor.   Medication Assistance: Organization         Address  Phone   Notes  [facility] Medication [facility] [address] [place] [place], [place] [address] [phone] --Must be a resident of [facility] -- Must have NO insurance coverage whatsoever (no Medicaid/ Medicare, etc.) -- The pt. MUST have a primary care doctor that directs their care regularly and follows them in the community   [facility]ist  [phone]   [employer]  [phone]    Agencies that provide inexpensive medical care: Organization         Address  Phone   Notes  [facility]  [phone]   [facility]    [phone]   [facility] [address] [place], [place] [address] [phone]   [facility] [address] [place] [place] [phone]   Planned Parenthood    [phone]   [facility]    [phone]   [facility]  [address], [place] Phone:  [phone], Fax:  [phone] Hours of Operation:  9 am - 6 pm, M-F.  Also accepts Medicaid/Medicare and self-pay.  [facility]  [address], [place] Phone: [phone], Fax: [phone]. Hours of Operation:  8:30 am - 5:30 pm, M-F.  Also accepts Medicaid and self-pay.  °[facility] [place] [address], [place] Phone: [phone]   °[facility] [address], [place], [place] [phone], Ext. [phone] Mondays & Thursdays: 7-9 AM.  First 15 patients are seen on a first come, first serve basis. °  ° °Medicaid-accepting [place] Providers: ° °Organization         Address  Phone   Notes  °[facility] [address], [place] [phone] Also  accepts self-pay patients.  °[facility] [address], [place] ° [phone]   °[facility] [address], [place] [phone]   °[facility] [address] [place] [address], [place] [phone]   °Veita Bland [address], [place]  ° [phone] Only accepts [place] Access Medicaid patients after they have their name applied to their card.  ° °Self-Pay (no insurance) in [place]: ° °Organization         Address  Phone   Notes  °Sickle Cell Patients, [facility] [address], [place] [phone]   °[facility] [address], [place] [phone]   °[facility] ° [address], [place] [phone]   °[facility]/Dr. Osei-Bonsu ° [address] [place] [address], [place] or [address], [place] [phone] Phone number for both [place] and [place] locations is the same.  °[facility] [address], [place] [phone]) [phone]   °[facility] [address] [place] [address], [place] or [address] [phone]) [phone] °([phone]) [phone]   °[facility] [address], [place] [phone]) [phone], phone; [phone], fax Sees patients 1st and 3rd Saturday of every month.  Must not qualify for public or private insurance (i.e. Medicaid, Medicare, [place] Health Choice, Veterans' Benefits) • Household income should be no more than 200% of the poverty level •The clinic cannot treat you if you are pregnant or think you are pregnant • Sexually transmitted diseases are not treated at the clinic.  ° ° °Dental Care: °Organization         Address  Phone  Notes  °[place] [facility] [address], [place] [phone]) [phone] Accepts children up to age 21 who are enrolled in Medicaid or [place] Health Choice; pregnant  women with a Medicaid card; and children who have applied for Medicaid or [place] Health Choice, but were declined, whose parents can pay a reduced fee at time of service.  °[place] [facility] [place]  [address], [place] [phone]) [phone] Accepts children up to age 21 who are enrolled in Medicaid or [place] Health Choice; pregnant women with a Medicaid card; and children who have applied for Medicaid or  Health Choice, but were declined, whose parents can pay a reduced fee at time of service.  °[facility] ° [address],  [phone] Patients are seen by appointment only. Walk-ins are not accepted. [facility] will see patients 18 years of age and older. °Monday - Tuesday (8am-5pm) °Most Wednesdays (8:30-5pm) °$30 per visit, cash only  °[facility]  [address], [facility] [phone] Patients are seen by appointment only. Walk-ins are not accepted. [place] will see patients 60 years of age and older. One Wednesday Evening (Monthly: Volunteer Based).  $30 per visit, cash only  [place]  [phone] for adults; Children under age 4, call Graduate Pediatric Dentistry at [phone]. Children aged 49-14, please call [phone] to request a pediatric application.  Dental services are provided in all areas of dental care including fillings, crowns and bridges, complete and partial dentures, implants, gum treatment, root canals, and extractions. Preventive care is also provided. Treatment is provided to both adults and children. Patients are selected via a lottery and there is often a waiting list.   [facility] [address] [place]  [phone] [email or web address]   [facility] [address] [place], [place] [phone], Ext. [phone] Second and Fourth Thursday of each month, opens at 6:30 AM; Clinic ends at 9 AM.  Patients are  seen on a first-come first-served basis, and a limited number are seen during each clinic.   [facility]  [address] Hillard Danker [place], [place] [phone]   Eligibility Requirements You must have lived in [place], [place], or [place] counties for at least the last three months.   You cannot be eligible for state or federal sponsored [employer], including [employer], [place], or [employer].   You generally cannot be eligible for healthcare insurance through your employer.    How to apply: Eligibility screenings are held every Tuesday and Wednesday afternoon from 1:00 pm until 4:00 pm. You do not need an appointment for the interview!  [facility] [address] [place], [place]   [place] Department  [place]  [phone]    Behavioral Health Resources in the Community: Intensive Outpatient Programs Organization         Address  Phone  Notes  [facility] [place]. [address], [place], [place] [phone]   [facility] Outpatient [address], [place], [place]   [facility]: [facility] [address] [place] [address], [place], [place]   [place] [address],  [place], [place] or [phone]   Substance Abuse Resources Organization         Address  Phone  Notes  [facility] Drug Services  [phone]   [place]  [phone]   [facility] [place]   Chinita Pester  [phone]   Residential & Outpatient Substance Abuse Program  [phone]   Psychological Services Organization         Address  Phone  Notes  [facility] [place]  [place]  [phone]   [place] [address] [place] [phone] or [phone]    Mobile Crisis  Teams Organization         Address  Phone  Notes  [facility], Mobile Crisis Care Unit  4045379558   [facility]  [address] [place], [place]   [facility] [address] [place] [phone]    Self-Help/Support Groups Organization         Address  Phone             Notes  [facility]  [facility] [place] - variety of support groups  [place] Call for more information  Narcotics Anonymous (NA), [facility] [address], [employer] [place]  2 meetings at this location   Special educational needs teacher         Address  Phone  Notes  [facility] [place],    [place]  [phone])[phone]   [facility]  [address], [place] [address], [place], [place]   [place], [place] [phone] Admissions: 8am-3pm M-F  Incentives Substance [place] [address],    [place], [place] [phone]   [facility] [address] [place], [place], [place]   [facility] [address]  [place], [place]   [facility] - Intensive Outpatient [address], [place], [place]   [facility] [place]  [place], [place] [phone] or [phone]   [facility] ([facility]) [address] [place], [place] Accepts Medicaid  [facility] [place] [address],  [place] [phone] Substance Abuse/Addiction Treatment   [facility] Organization         Address  Phone  Notes  [facility]  [phone]   Domenic Schwab, PhD [address] Arlis Porta [place], [place]   [phone] or [phone]   [facility], [place] [phone]   [facility] [address], [place], [place] [phone]  Insurance/Medicaid/sponsorship through [facility] and Families [address] [place] [address]                                    [place], [place] [phone] [place] [address], [place] [phone]    Dr. Adele Schilder  [phone]   [facility] Dept. 1) [address] [place] 2) [place] 3)  [place] [address], [place] [phone]  [phone]   [place] [phone] or [phone] (After Hours)

## 2014-07-30 NOTE — Progress Notes (Signed)
  CARE MANAGEMENT ED NOTE 07/30/2014  Patient:  Steven Chase, Steven Chase   Account Number:  [id number]  Date Initiated:  07/30/2014  Documentation initiated by:  Ferdinand Cava  Subjective/Objective Assessment:   28 yo male uninsured presenting to the ED with elevated BP     Subjective/Objective Assessment Detail:     Action/Plan:   Action/Plan Detail:   Patient is encouraged to schedule and appointment at the [facility] for continued care while enrolling for health insurance through his employment.   Anticipated DC Date:       Status Recommendation to Physician:   Result of Recommendation:  Agreed    DC Planning Services  CM consult  PCP issues    Choice offered to / List presented to:  C-1 Patient          Status of service:  Completed, signed off  ED Comments:   ED Comments Detail:  CM consulted due to no PCP or insurance. This CM spoke with the patient and he stated that he can enroll in Obamacare this Thursday through his employment. This CM spoke with the patient about the importance of a PCP for follow up care for continued management of his BP and continued medication prescriptions. This Cm offered to assist with scheduling an appointment at the [facility] but the patient declined and stated that he will schedule the appointment. This CM also provided the patient with the [employer] $4 list and the resource list for the uninsured in [place]. The patient verbalized understanding and had no other questions or concerns. The ED PA was made aware of information provided to the patient.

## 2014-07-30 NOTE — ED Provider Notes (Signed)
Medical screening examination/treatment/procedure(s) were conducted as a shared visit with non-physician practitioner(s) and myself.  I personally evaluated the patient during the encounter  Please see my separate respective documentation pertaining to this patient encounter   Vida Roller, MD 07/30/14 [phone]

## 2014-07-30 NOTE — ED Notes (Signed)
Pt in NAD, pt A&OX4. Pt ambulatory on discharge. IV taken out. Pt given prescription for blood pressure and reviewed discharge instructions.

## 2014-11-05 ENCOUNTER — Encounter: Payer: Self-pay | Admitting: Family

## 2014-11-05 ENCOUNTER — Ambulatory Visit (INDEPENDENT_AMBULATORY_CARE_PROVIDER_SITE_OTHER): Payer: 59 | Admitting: Family

## 2014-11-05 VITALS — BP 198/162 | HR 112 | Temp 98.0°F | Resp 18 | Ht 66.0 in | Wt 236.0 lb

## 2014-11-05 DIAGNOSIS — I10 Essential (primary) hypertension: Secondary | ICD-10-CM

## 2014-11-05 DIAGNOSIS — G4733 Obstructive sleep apnea (adult) (pediatric): Secondary | ICD-10-CM

## 2014-11-05 DIAGNOSIS — R0683 Snoring: Secondary | ICD-10-CM

## 2014-11-05 HISTORY — DX: Obstructive sleep apnea (adult) (pediatric): G47.33

## 2014-11-05 MED ORDER — AMLODIPINE BESYLATE 10 MG PO TABS: 10.0000 mg | ORAL_TABLET | Freq: Every day | ORAL | Status: DC

## 2014-11-05 NOTE — Progress Notes (Signed)
   Subjective:    Patient ID: Steven Chase, male    DOB: [birth year], 29 y.o.   MRN: [id number]  Chief Complaint  Patient presents with  . Establish Care    hypertension, mom says he sleeps really loud like hes out of breath, would like to be referred to sleep study    HPI:  Steven Chase is a 29 y.o. male who presents today to establish care and discuss his hypertension and snoring.  1) Hypertension - Not currently taking any medication. Due for an eye exam as he does not remember the last time he was seen.   BP Readings from Last 3 Encounters:  11/05/14 198/162  07/30/14 150/84  02/03/14 168/130     2) Snoring - Indicates that he snores at night which was recorded by his mother. She is concerned about apnea during his sleep. Patient indicates that this has been going on for a while. Has never had a sleep study performed. Describes feelings of fatigue, tiredness, sleepiness throughout the day. Denies having tried any treatments or using any other modifying factors.   No Known Allergies   No current outpatient prescriptions on file.   No current facility-administered medications for this visit.    Past Medical History  Diagnosis Date  . Hypertension     Family History  Problem Relation Age of Onset  . Hypertension Mother   . Heart disease Maternal Grandfather   . Hypertension Maternal Grandfather     History   Social History  . Marital Status: Single    Spouse Name: N/A    Number of Children: 1  . Years of Education: 12   Occupational History  . [employer]    Social History Main Topics  . Smoking status: Never Smoker   . Smokeless tobacco: Never Used  . Alcohol Use: No  . Drug Use: No  . Sexual Activity: Not on file   Other Topics Concern  . Not on file   Social History Narrative   Born and raised in [place], [place]. Currently live in a private residence with his mother. Fun: Play with his daughter.   Denies religious beliefs that would effect  health care.     Review of Systems  Constitutional: Positive for fatigue.  Eyes:       Denies changes in vision  Respiratory: Negative for chest tightness and shortness of breath.   Cardiovascular: Negative for chest pain and palpitations.  Neurological: Negative for headaches.      Objective:    BP 198/162 mmHg  Pulse 112  Temp(Src) 98 F (36.7 C) (Oral)  Resp 18  Ht 5\' 6"  (1.676 m)  Wt 236 lb (107.049 kg)  BMI 38.11 kg/m2  SpO2 97% Nursing note and vital signs reviewed.  Physical Exam  Constitutional: He is oriented to person, place, and time. He appears well-developed and well-nourished. No distress.  Obese male seated in the chair, dressed appropriately and appears his stated age.   Cardiovascular: Normal rate, regular rhythm, normal heart sounds and intact distal pulses.   Pulmonary/Chest: Effort normal and breath sounds normal.  Neurological: He is alert and oriented to person, place, and time.  Skin: Skin is warm and dry.  Psychiatric: He has a normal mood and affect. His behavior is normal. Judgment and thought content normal.       Assessment & Plan:

## 2014-11-05 NOTE — Assessment & Plan Note (Signed)
Patient indicates snoring at night and feelings of fatigue and tiredness during the day. Schedule sleep study. Discussed with patient importance of losing weight and the benefits that it may have with sleep and rest. Recommend losing 5-10% of his body weight by increasing fruits and vegetables and decreasing saturated fats. Also, increase physical activity 30 minutes most days of the week. Follow up after sleep study.

## 2014-11-05 NOTE — Patient Instructions (Signed)
Thank you for choosing [facility].  Summary/Instructions:  Your prescription(s) have been submitted to your pharmacy or been printed and provided for you. Please take as directed and contact our office if you believe you are having problem(s) with the medication(s) or have any questions.  If your symptoms worsen or fail to improve, please contact our office for further instruction, or in case of emergency go directly to the emergency room at the closest medical facility.   Hypertension Hypertension, commonly called high blood pressure, is when the force of blood pumping through your arteries is too strong. Your arteries are the blood vessels that carry blood from your heart throughout your body. A blood pressure reading consists of a higher number over a lower number, such as 110/72. The higher number (systolic) is the pressure inside your arteries when your heart pumps. The lower number (diastolic) is the pressure inside your arteries when your heart relaxes. Ideally you want your blood pressure below 120/80. Hypertension forces your heart to work harder to pump blood. Your arteries may become narrow or stiff. Having hypertension puts you at risk for heart disease, stroke, and other problems.  RISK FACTORS Some risk factors for high blood pressure are controllable. Others are not.  Risk factors you cannot control include:   Race. You may be at higher risk if you are African American.  Age. Risk increases with age.  Gender. Men are at higher risk than women before age 45 years. After age 65, women are at higher risk than men. Risk factors you can control include:  Not getting enough exercise or physical activity.  Being overweight.  Getting too much fat, sugar, calories, or salt in your diet.  Drinking too much alcohol. SIGNS AND SYMPTOMS Hypertension does not usually cause signs or symptoms. Extremely high blood pressure (hypertensive crisis) may cause headache, anxiety,  shortness of breath, and nosebleed. DIAGNOSIS  To check if you have hypertension, your health care provider will measure your blood pressure while you are seated, with your arm held at the level of your heart. It should be measured at least twice using the same arm. Certain conditions can cause a difference in blood pressure between your right and left arms. A blood pressure reading that is higher than normal on one occasion does not mean that you need treatment. If one blood pressure reading is high, ask your health care provider about having it checked again. TREATMENT  Treating high blood pressure includes making lifestyle changes and possibly taking medicine. Living a healthy lifestyle can help lower high blood pressure. You may need to change some of your habits. Lifestyle changes may include:  Following the DASH diet. This diet is high in fruits, vegetables, and whole grains. It is low in salt, red meat, and added sugars.  Getting at least 2 hours of brisk physical activity every week.  Losing weight if necessary.  Not smoking.  Limiting alcoholic beverages.  Learning ways to reduce stress. If lifestyle changes are not enough to get your blood pressure under control, your health care provider may prescribe medicine. You may need to take more than one. Work closely with your health care provider to understand the risks and benefits. HOME CARE INSTRUCTIONS  Have your blood pressure rechecked as directed by your health care provider.   Take medicines only as directed by your health care provider. Follow the directions carefully. Blood pressure medicines must be taken as prescribed. The medicine does not work as well when you skip doses.   Skipping doses also puts you at risk for problems.   Do not smoke.   Monitor your blood pressure at home as directed by your health care provider. SEEK MEDICAL CARE IF:   You think you are having a reaction to medicines taken.  You have  recurrent headaches or feel dizzy.  You have swelling in your ankles.  You have trouble with your vision. SEEK IMMEDIATE MEDICAL CARE IF:  You develop a severe headache or confusion.  You have unusual weakness, numbness, or feel faint.  You have severe chest or abdominal pain.  You vomit repeatedly.  You have trouble breathing. MAKE SURE YOU:   Understand these instructions.  Will watch your condition.  Will get help right away if you are not doing well or get worse. Document Released: 10/19/2005 Document Revised: 03/05/2014 Document Reviewed: 08/11/2013 ExitCare Patient Information 2015 [employer]. This information is not intended to replace advice given to you by your health care provider. Make sure you discuss any questions you have with your health care provider.   

## 2014-11-05 NOTE — Progress Notes (Signed)
Pre visit review using our clinic review tool, if applicable. No additional management support is needed unless otherwise documented below in the visit note. 

## 2014-11-05 NOTE — Assessment & Plan Note (Signed)
Hypertension remains uncontrolled. Patient has not taken any medications in the last 30 days. Start amlodipine 10 mg daily. We will need eye exam. Obtain blood work at next visit. Follow up in 2 weeks.

## 2014-11-06 ENCOUNTER — Telehealth: Payer: Self-pay | Admitting: Family

## 2014-11-06 NOTE — Telephone Encounter (Signed)
emmi mailed  °

## 2014-11-19 ENCOUNTER — Other Ambulatory Visit: Payer: Self-pay | Admitting: Family

## 2014-11-19 ENCOUNTER — Ambulatory Visit (INDEPENDENT_AMBULATORY_CARE_PROVIDER_SITE_OTHER): Payer: 59

## 2014-11-19 VITALS — BP 178/118 | HR 113

## 2014-11-19 DIAGNOSIS — Z23 Encounter for immunization: Secondary | ICD-10-CM

## 2014-11-19 MED ORDER — CHLORTHALIDONE 25 MG PO TABS: 25.0000 mg | ORAL_TABLET | Freq: Every day | ORAL | Status: DC

## 2014-12-11 ENCOUNTER — Ambulatory Visit: Payer: 59 | Admitting: Family

## 2014-12-12 ENCOUNTER — Ambulatory Visit (INDEPENDENT_AMBULATORY_CARE_PROVIDER_SITE_OTHER): Payer: 59 | Admitting: Family

## 2014-12-12 ENCOUNTER — Other Ambulatory Visit (INDEPENDENT_AMBULATORY_CARE_PROVIDER_SITE_OTHER): Payer: 59

## 2014-12-12 ENCOUNTER — Encounter: Payer: Self-pay | Admitting: Family

## 2014-12-12 VITALS — BP 180/132 | HR 107 | Temp 97.8°F | Resp 20 | Wt 245.8 lb

## 2014-12-12 DIAGNOSIS — R600 Localized edema: Secondary | ICD-10-CM

## 2014-12-12 DIAGNOSIS — R0683 Snoring: Secondary | ICD-10-CM

## 2014-12-12 DIAGNOSIS — I10 Essential (primary) hypertension: Secondary | ICD-10-CM

## 2014-12-12 LAB — COMPREHENSIVE METABOLIC PANEL
ALT: 173 U/L — ABNORMAL HIGH (ref 0–53)
AST: 89 U/L — ABNORMAL HIGH (ref 0–37)
Albumin: 4.6 g/dL (ref 3.5–5.2)
Alkaline Phosphatase: 65 U/L (ref 39–117)
BILIRUBIN TOTAL: 0.6 mg/dL (ref 0.2–1.2)
BUN: 14 mg/dL (ref 6–23)
CHLORIDE: 98 meq/L (ref 96–112)
CO2: 33 meq/L — AB (ref 19–32)
CREATININE: 1.05 mg/dL (ref 0.40–1.50)
Calcium: 10.1 mg/dL (ref 8.4–10.5)
GFR: 107.24 mL/min (ref >60.00)
GLUCOSE: 107 mg/dL — AB (ref 70–99)
Potassium: 3.4 mEq/L — ABNORMAL LOW (ref 3.5–5.1)
Sodium: 138 mEq/L (ref 135–145)
Total Protein: 8.1 g/dL (ref 6.0–8.3)

## 2014-12-12 MED ORDER — CHLORTHALIDONE 25 MG PO TABS: 25.0000 mg | ORAL_TABLET | Freq: Every day | ORAL | Status: DC

## 2014-12-12 MED ORDER — LISINOPRIL 10 MG PO TABS: 10.0000 mg | ORAL_TABLET | Freq: Every day | ORAL | Status: DC

## 2014-12-12 NOTE — Assessment & Plan Note (Signed)
Bilateral lower extremity edema most likely related to amlodipine. Discontinue amlodipine. Patient instructed to elevate his feet when at rest, drink plenty of fluids, and decrease salt in his diet. Instructed to take extra chlorthalidone today if swelling does not improve. Obtain complete metabolic panel to rule out any liver or kidney dysfunction. Follow-up if symptoms worsen or fail to improve.

## 2014-12-12 NOTE — Assessment & Plan Note (Signed)
Patient continues to experience snoring at night and daytime fatigue. Still awaiting his sleep study. Discussed importance of continuing to try to lose weight and eat appropriately. Checked with patient care coordinator's and they indicates prior authorization is needed. We'll continue to monitor.

## 2014-12-12 NOTE — Patient Instructions (Addendum)
Thank you for choosing [facility].  Summary/Instructions:  Keep legs elevated as much as possible  Decrease salt in your diet  Drink plenty of fluids  Compression socks as needed    Your prescription(s) have been submitted to your pharmacy or been printed and provided for you. Please take as directed and contact our office if you believe you are having problem(s) with the medication(s) or have any questions.  Please stop by the lab on the basement level of the building for your blood work. Your results will be released to MyChart (or called to you) after review, usually within 72 hours after test completion. If any changes need to be made, you will be notified at that same time.  Referrals have been made during this visit. You should expect to hear back from our schedulers in about 7-10 days in regards to establishing an appointment with the specialists we discussed.   If your symptoms worsen or fail to improve, please contact our office for further instruction, or in case of emergency go directly to the emergency room at the closest medical facility.

## 2014-12-12 NOTE — Progress Notes (Signed)
   Subjective:    Patient ID: Steven Chase, male    DOB: [birth year], 29 y.o.   MRN: [id number]  Chief Complaint  Patient presents with  . Foot Swelling    bilateral feet swelling, x2 weeks swell up more as the day goes on, also hypertension    HPI:  Steven Chase is a 29 y.o. male who presents today for foot swelling. His mother is present for today's visit with his permission.   1) Leg swelling - This is a new problem. Associated symptoms of bilateral foot swelling has been going for about 2 weeks. The timing of the swelling worsens as the day progresses. Indicates that he is taking his medications as prescribed. Denies any other modifying factors.    2) Blood pressure - This is a chronic problem that is not improving. He is currently treated with amlodipine and chlortalidone.   BP Readings from Last 3 Encounters:  12/12/14 180/132  11/19/14 178/118  11/05/14 198/162   3) Snoring - Continues to experience snoring and moments of breathlessness over the course of the night. Mother produced a recording of his snoring and apnea.    No Known Allergies   Current Outpatient Prescriptions  Medication Sig Dispense Refill  . chlorthalidone (HYGROTON) 25 MG tablet Take 1 tablet (25 mg total) by mouth daily. 90 tablet 1  . lisinopril (PRINIVIL,ZESTRIL) 10 MG tablet Take 1 tablet (10 mg total) by mouth daily. 30 tablet 0   No current facility-administered medications for this visit.    Review of Systems  Constitutional: Positive for fatigue.  Respiratory: Negative for chest tightness and shortness of breath.   Cardiovascular: Negative for chest pain, palpitations and leg swelling.  Psychiatric/Behavioral: Positive for sleep disturbance.      Objective:    BP 180/132 mmHg  Pulse 107  Temp(Src) 97.8 F (36.6 C) (Oral)  Resp 20  Wt 245 lb 12.8 oz (111.494 kg)  SpO2 97% Nursing note and vital signs reviewed. Retake blood pressure 150/100  Physical Exam  Constitutional:  He is oriented to person, place, and time. He appears well-developed and well-nourished. No distress.  Obese male seated in the chair with congested sounding respirations, falling asleep during discussion. Dressed appropriately and appears his stated age.   Cardiovascular: Normal rate, regular rhythm, normal heart sounds and intact distal pulses.   Bilateral lower extremity edema present at least 1+ pitting.   Pulmonary/Chest: Effort normal and breath sounds normal.  Neurological: He is alert and oriented to person, place, and time.  Skin: Skin is warm and dry.  Psychiatric: He has a normal mood and affect. His behavior is normal. Judgment and thought content normal.       Assessment & Plan:

## 2014-12-12 NOTE — Progress Notes (Signed)
Pre visit review using our clinic review tool, if applicable. No additional management support is needed unless otherwise documented below in the visit note. 

## 2014-12-12 NOTE — Assessment & Plan Note (Signed)
Blood pressure continues to remain labile and greater than goal of 140/90. Given edema, discontinue amlodipine at this time and start lisinopril. Discussed side effects of medication and to watch for cough. Follow-up in 2 weeks.

## 2014-12-13 ENCOUNTER — Telehealth: Payer: Self-pay | Admitting: Family

## 2014-12-13 NOTE — Telephone Encounter (Signed)
Please inform the patient that his lab work has come back and his kidney function has come back within the normal ranges and his liver enzymes were slightly elevated. This can occur with alcohol consumption. We will continue to monitor this for now and recheck either at his next visit or in the next couple of months.

## 2014-12-13 NOTE — Telephone Encounter (Signed)
Pt aware of results 

## 2014-12-13 NOTE — Telephone Encounter (Signed)
Left pt a message to call back. 

## 2014-12-26 ENCOUNTER — Ambulatory Visit: Payer: 59

## 2014-12-26 VITALS — BP 162/110

## 2014-12-26 DIAGNOSIS — I10 Essential (primary) hypertension: Secondary | ICD-10-CM

## 2015-01-04 ENCOUNTER — Telehealth: Payer: Self-pay | Admitting: Family

## 2015-01-04 MED ORDER — LISINOPRIL 10 MG PO TABS: 10.0000 mg | ORAL_TABLET | Freq: Every day | ORAL | Status: DC

## 2015-01-04 NOTE — Telephone Encounter (Signed)
Pt request refill for lisinopril (PRINIVIL,ZESTRIL) 10 MG to be send to [facility].

## 2015-01-04 NOTE — Telephone Encounter (Signed)
Medication sent.

## 2015-02-18 ENCOUNTER — Ambulatory Visit (HOSPITAL_BASED_OUTPATIENT_CLINIC_OR_DEPARTMENT_OTHER): Payer: 59 | Attending: Family

## 2015-02-18 VITALS — Ht 66.0 in | Wt 245.0 lb

## 2015-02-18 DIAGNOSIS — R0683 Snoring: Secondary | ICD-10-CM

## 2015-02-18 DIAGNOSIS — G4733 Obstructive sleep apnea (adult) (pediatric): Secondary | ICD-10-CM | POA: Diagnosis not present

## 2015-02-18 DIAGNOSIS — G4710 Hypersomnia, unspecified: Secondary | ICD-10-CM | POA: Diagnosis present

## 2015-02-18 DIAGNOSIS — I491 Atrial premature depolarization: Secondary | ICD-10-CM | POA: Diagnosis not present

## 2015-02-18 DIAGNOSIS — I493 Ventricular premature depolarization: Secondary | ICD-10-CM | POA: Diagnosis not present

## 2015-03-01 ENCOUNTER — Telehealth: Payer: Self-pay | Admitting: Family

## 2015-03-01 DIAGNOSIS — G4733 Obstructive sleep apnea (adult) (pediatric): Secondary | ICD-10-CM

## 2015-03-01 NOTE — Telephone Encounter (Signed)
Pt mother called in and would like to see if the sleep study results have come in.  If so she would like call back if so?

## 2015-03-04 NOTE — Telephone Encounter (Signed)
Please inform the patient that I have not seen any information yet and there are no results in the encounter in the computer.

## 2015-03-12 NOTE — Telephone Encounter (Signed)
Pt mother called back in regarding the sleep study results.

## 2015-03-15 DIAGNOSIS — G4733 Obstructive sleep apnea (adult) (pediatric): Secondary | ICD-10-CM | POA: Diagnosis not present

## 2015-03-15 NOTE — Sleep Study (Signed)
   NAME: Steven Chase DATE OF BIRTH:  [birth year] MEDICAL RECORD NUMBER [id number]  LOCATION: [facility]  PHYSICIAN: Barbaraann Share  DATE OF STUDY: 02/18/2015  SLEEP STUDY TYPE: Nocturnal Polysomnogram               REFERRING PHYSICIAN: Veryl Speak, FNP  INDICATION FOR STUDY: Hypersomnia with sleep apnea  EPWORTH SLEEPINESS SCORE:  16 HEIGHT: 5\' 6"  (167.6 cm)  WEIGHT: 245 lb (111.131 kg)    Body mass index is 39.56 kg/(m^2).  NECK SIZE: 18 in.  MEDICATIONS: Reviewed in the sleep record  SLEEP ARCHITECTURE: The patient had a total sleep time of 306 minutes, with no slow-wave sleep and only 44 minutes of REM. Sleep onset latency was normal at 6 minutes, as was REM onset. Sleep efficiency was mildly reduced at 81%.  RESPIRATORY DATA: The patient was found to have 508 apneas and 33 obstructive hypopneas, giving him an AHI of 106 events per hour. The events occurred in all body positions, and there was very loud snoring noted throughout.  OXYGEN DATA: The patient had oxygen desaturation as low as 57% with his obstructive events  CARDIAC DATA: Occasional PAC and PVC noted throughout  MOVEMENT/PARASOMNIA: There were no significant limb movements or other abnormal behavior seen.  IMPRESSION/ RECOMMENDATION:    1) very severe obstructive sleep apnea/hypopnea syndrome, with an AHI of 106 events per hour and oxygen desaturation as low as 57%. Treatment for this degree of sleep apnea should focus on a trial of C Pap as well as aggressive weight loss.  2) occasional PAC and PVC noted, but no clinically significant arrhythmias were seen    Barbaraann Share Diplomate, American Board of Sleep Medicine  ELECTRONICALLY SIGNED ON:  03/15/2015, 9:03 AM [facility] PH: [phone]   FX: [phone] ACCREDITED BY THE AMERICAN ACADEMY OF SLEEP MEDICINE

## 2015-03-18 NOTE — Telephone Encounter (Signed)
Please apologize that it has taken this long to get results. We just received them. The study indicates that he does have severe sleep apnea and they are recommending CPAP or a breathing device to help him. I have sent a referral to pulmonology for them to establish the CPAP.

## 2015-03-18 NOTE — Telephone Encounter (Signed)
Pt aware of results 

## 2015-03-25 ENCOUNTER — Institutional Professional Consult (permissible substitution): Payer: 59 | Admitting: Pulmonary Disease

## 2015-05-16 ENCOUNTER — Institutional Professional Consult (permissible substitution): Payer: 59 | Admitting: Pulmonary Disease

## 2015-05-28 ENCOUNTER — Institutional Professional Consult (permissible substitution): Payer: 59 | Admitting: Pulmonary Disease

## 2015-07-03 ENCOUNTER — Institutional Professional Consult (permissible substitution): Payer: 59 | Admitting: Pulmonary Disease

## 2015-07-04 ENCOUNTER — Institutional Professional Consult (permissible substitution): Payer: 59 | Admitting: Pulmonary Disease

## 2015-07-19 ENCOUNTER — Telehealth: Payer: Self-pay | Admitting: *Deleted

## 2015-07-19 MED ORDER — LISINOPRIL 10 MG PO TABS: 10.0000 mg | ORAL_TABLET | Freq: Every day | ORAL | Status: DC

## 2015-07-19 MED ORDER — CHLORTHALIDONE 25 MG PO TABS: 25.0000 mg | ORAL_TABLET | Freq: Every day | ORAL | Status: DC

## 2015-07-19 NOTE — Telephone Encounter (Signed)
Receive call from pt mom pt is requesting to have his blood pressure med combine if possible. Currently taking Lisinopril & Chlorothalidone pls send to [facility]...Raechel Chute

## 2015-07-19 NOTE — Telephone Encounter (Signed)
Notified pt mom with Greg response. She states he does need refills inform will send to [facility]....Raechel Chute

## 2015-07-19 NOTE — Telephone Encounter (Signed)
These medications are not able to be combined. There are other medications that can be. Ok to refill if needed.

## 2015-08-16 ENCOUNTER — Emergency Department (HOSPITAL_COMMUNITY)
Admission: EM | Admit: 2015-08-16 | Discharge: 2015-08-16 | Disposition: A | Discharge status: Home / Self Care | Payer: 59 | Attending: Emergency Medicine | Admitting: Emergency Medicine

## 2015-08-16 ENCOUNTER — Emergency Department (HOSPITAL_COMMUNITY): Payer: 59

## 2015-08-16 ENCOUNTER — Encounter (HOSPITAL_COMMUNITY): Payer: Self-pay | Admitting: *Deleted

## 2015-08-16 DIAGNOSIS — M25571 Pain in right ankle and joints of right foot: Secondary | ICD-10-CM | POA: Diagnosis not present

## 2015-08-16 DIAGNOSIS — I10 Essential (primary) hypertension: Secondary | ICD-10-CM | POA: Insufficient documentation

## 2015-08-16 DIAGNOSIS — M2550 Pain in unspecified joint: Secondary | ICD-10-CM

## 2015-08-16 DIAGNOSIS — Z79899 Other long term (current) drug therapy: Secondary | ICD-10-CM | POA: Diagnosis not present

## 2015-08-16 DIAGNOSIS — M25471 Effusion, right ankle: Secondary | ICD-10-CM | POA: Insufficient documentation

## 2015-08-16 MED ORDER — NAPROXEN 500 MG PO TABS: 500.0000 mg | ORAL_TABLET | Freq: Two times a day (BID) | ORAL | Status: DC

## 2015-08-16 MED ORDER — TRAMADOL HCL 50 MG PO TABS: 50.0000 mg | ORAL_TABLET | Freq: Four times a day (QID) | ORAL | Status: DC | PRN

## 2015-08-16 NOTE — Discharge Instructions (Signed)

## 2015-08-16 NOTE — ED Provider Notes (Signed)
CSN: [id number]     Arrival date & time 08/16/15  0803 History   First MD Initiated Contact with Patient 08/16/15 0813     Chief Complaint  Patient presents with  . Ankle Pain     (Consider location/radiation/quality/duration/timing/severity/associated sxs/prior Treatment) HPI Comments: Patient presents to the emergency department for evaluation of right ankle pain. Patient reports that he noticed pain while getting ready for work this morning. Patient denies any direct injury. He has noticed swelling, no redness or warmth. He does not have any lower leg or calf pain, swelling or tenderness.   Past Medical History  Diagnosis Date  . Hypertension    Past Surgical History  Procedure Laterality Date  . Hernia repair     Family History  Problem Relation Age of Onset  . Hypertension Mother   . Heart disease Maternal Grandfather   . Hypertension Maternal Grandfather    Social History  Substance Use Topics  . Smoking status: Never Smoker   . Smokeless tobacco: Never Used  . Alcohol Use: No    Review of Systems  Musculoskeletal: Positive for arthralgias.  All other systems reviewed and are negative.     Allergies  Review of patient's allergies indicates no known allergies.  Home Medications   Prior to Admission medications   Medication Sig Start Date End Date Taking? Authorizing Provider  chlorthalidone (HYGROTON) 25 MG tablet Take 1 tablet (25 mg total) by mouth daily. 07/19/15  Yes Veryl Speak, FNP  lisinopril (PRINIVIL,ZESTRIL) 10 MG tablet Take 1 tablet (10 mg total) by mouth daily. 07/19/15  Yes Veryl Speak, FNP  naproxen (NAPROSYN) 500 MG tablet Take 1 tablet (500 mg total) by mouth 2 (two) times daily. 08/16/15   Gilda Crease, MD  traMADol (ULTRAM) 50 MG tablet Take 1 tablet (50 mg total) by mouth every 6 (six) hours as needed. 08/16/15   Gilda Crease, MD   BP 175/132 mmHg  Pulse 111  Temp(Src) 98.5 F (36.9 C) (Oral)  Resp 18  Ht 5'  6" (1.676 m)  Wt 230 lb (104.327 kg)  BMI 37.14 kg/m2  SpO2 98% Physical Exam  Constitutional: He is oriented to person, place, and time. He appears well-developed and well-nourished. No distress.  HENT:  Head: Normocephalic and atraumatic.  Right Ear: Hearing normal.  Left Ear: Hearing normal.  Nose: Nose normal.  Mouth/Throat: Oropharynx is clear and moist and mucous membranes are normal.  Eyes: Conjunctivae and EOM are normal. Pupils are equal, round, and reactive to light.  Neck: Normal range of motion. Neck supple.  Cardiovascular: Regular rhythm, S1 normal and S2 normal.  Exam reveals no gallop and no friction rub.   No murmur heard. Pulses:      Dorsalis pedis pulses are 2+ on the right side.  Pulmonary/Chest: Effort normal and breath sounds normal. No respiratory distress. He exhibits no tenderness.  Abdominal: Soft. Normal appearance and bowel sounds are normal. There is no hepatosplenomegaly. There is no tenderness. There is no rebound, no guarding, no tenderness at McBurney's point and negative Murphy's sign. No hernia.  Musculoskeletal: Normal range of motion.       Right ankle: Tenderness (anterior TF ligament).       Feet:  Neurological: He is alert and oriented to person, place, and time. He has normal strength. No cranial nerve deficit or sensory deficit. Coordination normal. GCS eye subscore is 4. GCS verbal subscore is 5. GCS motor subscore is 6.  Skin: Skin is warm,  dry and intact. No rash noted. No cyanosis.  Psychiatric: He has a normal mood and affect. His speech is normal and behavior is normal. Thought content normal.  Nursing note and vitals reviewed.   ED Course  Procedures (including critical care time) Labs Review Labs Reviewed - No data to display  Imaging Review Dg Ankle Complete Right  08/16/2015  CLINICAL DATA:  Pain and swelling laterally.  No known trauma. EXAM: RIGHT ANKLE - COMPLETE 3+ VIEW COMPARISON:  None. FINDINGS: Frontal, oblique, and  lateral views were obtained. There is soft tissue swelling laterally. There is no fracture or joint effusion. Ankle mortise appears intact. No appreciable arthropathic change. IMPRESSION: Soft tissue swelling laterally. No fracture or appreciable arthropathy. Mortise appears intact. Electronically Signed   By: Bretta Bang III M.D.   On: 08/16/2015 09:05   I have personally reviewed and evaluated these images and lab results as part of my medical decision-making.   EKG Interpretation None      MDM   Final diagnoses:  Arthralgia    Presents to the emergency department with complaints of pain and swelling of the right ankle area. Patient denies injury. Tenderness is in the anterior talofibular ligament region of the ankle and proximal foot. He does not have any calf tenderness or swelling. Negative Homans sign. Patient has no overlying skin changes or signs of infection. Joint is not swollen or warm. He has normal pulses palpable in the foot, no concern for vascular compromise. Xray negative.     Gilda Crease, MD 08/18/15 [phone]

## 2015-08-16 NOTE — ED Notes (Signed)
Ortho tech coming to apply ASO ankle orthotic

## 2015-08-16 NOTE — ED Notes (Addendum)
Pt woke up this morning with right ankle pain when he places weight on it.  No injury that he is aware of

## 2015-08-21 ENCOUNTER — Encounter: Payer: Self-pay | Admitting: Pulmonary Disease

## 2015-08-21 ENCOUNTER — Ambulatory Visit (INDEPENDENT_AMBULATORY_CARE_PROVIDER_SITE_OTHER): Payer: 59 | Admitting: Pulmonary Disease

## 2015-08-21 VITALS — BP 144/90 | HR 93 | Ht 66.0 in | Wt 236.2 lb

## 2015-08-21 DIAGNOSIS — Z23 Encounter for immunization: Secondary | ICD-10-CM

## 2015-08-21 DIAGNOSIS — G4733 Obstructive sleep apnea (adult) (pediatric): Secondary | ICD-10-CM

## 2015-08-21 NOTE — Progress Notes (Signed)
   Subjective:    Patient ID: Steven Chase, male    DOB: 1986-04-21, 29 y.o.   MRN: [id number]  HPI  Chief Complaint  Patient presents with  . SLEEP CONSULT    Referred by [facility]; Sleep Study 03/18/15; snoring Epworth Score: 33   29 year old presents for evaluation of obstructive sleep apnea. His mom and ex-girlfriend has noted loud snoring and witnessed apneas. 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  Epworth sleepiness score was noted to 16. He was very sleepy during the interview and constantly yawning. He states that was because he did not get enough sleep due to his 30-year-old daughter. Bedtime is around 10 PM, sleep latency is minimal, he sleeps on his side with 2 pillows, reports 1 bathroom visit for nocturia and is out of bed by 5 AM on workdays-feeling tired with occasional dryness of mouth or headaches. He is gained about 5-10 pounds over the last 2 years. There is no history suggestive of cataplexy, sleep paralysis or parasomnias  PSG/2016-showed severe OSA with AHI 106/hour and lowest desaturation of 57%    Past Medical History  Diagnosis Date  . Hypertension     Past Surgical History  Procedure Laterality Date  . Hernia repair      No Known Allergies  Social History   Social History  . Marital Status: Single    Spouse Name: N/A  . Number of Children: 1  . Years of Education: 12   Occupational History  . Unloader    Social History Main Topics  . Smoking status: Never Smoker   . Smokeless tobacco: Never Used  . Alcohol Use: No  . Drug Use: No  . Sexual Activity: Not on file   Other Topics Concern  . Not on file   Social History Narrative   Born and raised in [place], [place]. Currently live in a private residence with his mother. Fun: Play with his daughter.   Denies religious beliefs that would effect health care.    Family History  Problem Relation Age of Onset  . Hypertension Mother   . Heart disease  Maternal Grandfather   . Hypertension Maternal Grandfather      Review of Systems neg for any significant sore throat, dysphagia, itching, sneezing, nasal congestion or excess/ purulent secretions, fever, chills, sweats, unintended wt loss, pleuritic or exertional cp, hempoptysis, orthopnea pnd or change in chronic leg swelling. Also denies presyncope, palpitations, heartburn, abdominal pain, nausea, vomiting, diarrhea or change in bowel or urinary habits, dysuria,hematuria, rash, arthralgias, visual complaints, headache, numbness weakness or ataxia.     Objective:   Physical Exam  Gen. Pleasant, obese, in no distress, normal affect ENT - no lesions, no post nasal drip, class 2-3 airway Neck: No JVD, no thyromegaly, no carotid bruits Lungs: no use of accessory muscles, no dullness to percussion, decreased without rales or rhonchi  Cardiovascular: Rhythm regular, heart sounds  normal, no murmurs or gallops, no peripheral edema Abdomen: soft and non-tender, no hepatosplenomegaly, BS normal. Musculoskeletal: No deformities, no cyanosis or clubbing Neuro:  alert, non focal, no tremors       Assessment & Plan:

## 2015-08-21 NOTE — Patient Instructions (Signed)
Rx for auto-CPAP will be sent to DME Use it for at least 6h/ night CPAP titration study

## 2015-08-21 NOTE — Assessment & Plan Note (Addendum)
Rx for auto-CPAP 5-15, mask of choice, humiditywill be sent to DME Download in 4 wks Use it for at least 6h/ night CPAP titration study for more accurate titration and to ensure that events eliminated  Weight loss encouraged, compliance with goal of at least 4-6 hrs every night is the expectation. Advised against medications with sedative side effects Cautioned against driving when sleepy - understanding that sleepiness will vary on a day to day basis

## 2015-09-03 ENCOUNTER — Telehealth: Payer: Self-pay | Admitting: Pulmonary Disease

## 2015-09-03 NOTE — Telephone Encounter (Signed)
Will forward to RA as an FYI 

## 2015-09-27 NOTE — Telephone Encounter (Signed)
Received letter showing that patient was set up witgh CPAP on 09/18/15

## 2015-10-02 ENCOUNTER — Ambulatory Visit: Payer: 59 | Admitting: Adult Health

## 2015-10-08 ENCOUNTER — Telehealth: Payer: Self-pay | Admitting: Pulmonary Disease

## 2015-10-08 NOTE — Telephone Encounter (Signed)
No forms on this patient on the front fax machine, in front folder, or on RA's look-at.  Libby since you were mentioned by the caller, please advise if you have this form or know about this.  Thanks!

## 2015-10-08 NOTE — Telephone Encounter (Signed)
i received these papers and filled them out and faxed back to [facility] [employer]

## 2015-10-14 ENCOUNTER — Telehealth: Payer: Self-pay | Admitting: *Deleted

## 2015-10-14 NOTE — Telephone Encounter (Signed)
Called and left message on voicemail advising patient to call our office to schedule appointment with TP Patient just needs to schedule CPAP follow up appointment.

## 2015-10-14 NOTE — Telephone Encounter (Signed)
-----   Message from Oretha Milch, MD sent at 10/10/2015  5:26 PM EST ----- OK He has been set up with CPAP already Marcelino Duster, pl schedule FU with CPAP download with Tp or me ----- Message -----    From: Tobe Sos    Sent: 10/10/2015   2:18 PM      To: Oretha Milch, MD, Karleen Hampshire, CMA  [employer] has denied his cpap titration study i even sent all records and npsgs and they still denied it we can appeal it if you want or i will just let the pt know it can't be done and i guess just do an auto study on cpap .

## 2015-10-15 NOTE — Telephone Encounter (Signed)
lmtcb X2 for pt.  

## 2015-10-16 NOTE — Telephone Encounter (Signed)
LMTCB

## 2015-10-17 NOTE — Telephone Encounter (Signed)
LMTCB for pt 

## 2015-10-18 NOTE — Telephone Encounter (Signed)
lmtcb

## 2015-10-22 NOTE — Telephone Encounter (Signed)
lmtcb for pt.  Letter sent to pt.  Will close message per triage protocol.

## 2015-11-06 ENCOUNTER — Encounter (HOSPITAL_BASED_OUTPATIENT_CLINIC_OR_DEPARTMENT_OTHER): Payer: 59

## 2015-11-07 ENCOUNTER — Telehealth: Payer: Self-pay | Admitting: Pulmonary Disease

## 2015-11-07 NOTE — Telephone Encounter (Signed)
Compliance Report Results  Per RA:  Poor usage  CPAP average 12 cm Effective when used  Can we help? CPAP will be taken away other wise

## 2015-11-08 NOTE — Telephone Encounter (Signed)
Spoke with pt to relay results/recs. Pt states he works late and doesn't come home until about 6:00, wears his cpap until he wakes up about 12:00.  States he was contacted by his DME company about his usage this week also.  Pt states there is nothing we can do on our end to improve his usage, but that he will try to increase his usage.  Nothing further needed at this time.  Forwarding to RA as FYI.

## 2015-11-13 ENCOUNTER — Encounter: Payer: Self-pay | Admitting: Pulmonary Disease

## 2016-01-03 ENCOUNTER — Telehealth: Payer: Self-pay | Admitting: Family

## 2016-01-03 MED ORDER — CHLORTHALIDONE 25 MG PO TABS
25.0000 mg | ORAL_TABLET | Freq: Every day | ORAL | Status: DC
Start: 2016-01-03 — End: 2016-05-19

## 2016-01-03 MED ORDER — LISINOPRIL 10 MG PO TABS: 10.0000 mg | ORAL_TABLET | Freq: Every day | ORAL | Status: DC

## 2016-01-03 NOTE — Telephone Encounter (Signed)
Pt mother called in and said that pt is out of BP meds and needs them called in today .  To the [facility] on file

## 2016-01-03 NOTE — Telephone Encounter (Signed)
Rx sent pt aware 

## 2016-03-24 ENCOUNTER — Other Ambulatory Visit: Payer: Self-pay | Admitting: Family

## 2016-05-16 ENCOUNTER — Other Ambulatory Visit: Payer: Self-pay | Admitting: Family

## 2016-05-19 ENCOUNTER — Telehealth: Payer: Self-pay | Admitting: Family

## 2016-05-19 MED ORDER — CHLORTHALIDONE 25 MG PO TABS: 25.0000 mg | ORAL_TABLET | Freq: Every day | ORAL | Status: DC

## 2016-05-19 MED ORDER — LISINOPRIL 10 MG PO TABS: 10.0000 mg | ORAL_TABLET | Freq: Every day | ORAL | Status: DC

## 2016-05-19 NOTE — Telephone Encounter (Signed)
Patient has scheduled med follow up for 7/24.  Patient is requesting refill of blood pressure medication - chlorthalidone and lisinopril to be sent to [facility] on [address] until appt.

## 2016-05-25 ENCOUNTER — Encounter: Payer: Self-pay | Admitting: Family

## 2016-05-25 ENCOUNTER — Ambulatory Visit (INDEPENDENT_AMBULATORY_CARE_PROVIDER_SITE_OTHER): Payer: Self-pay | Admitting: Family

## 2016-05-25 VITALS — BP 168/130 | HR 103 | Temp 97.8°F | Resp 18 | Ht 66.0 in | Wt 238.0 lb

## 2016-05-25 DIAGNOSIS — I10 Essential (primary) hypertension: Secondary | ICD-10-CM

## 2016-05-25 DIAGNOSIS — E669 Obesity, unspecified: Secondary | ICD-10-CM

## 2016-05-25 MED ORDER — CHLORTHALIDONE 25 MG PO TABS: 25.0000 mg | ORAL_TABLET | Freq: Every day | ORAL | 2 refills | Status: DC

## 2016-05-25 MED ORDER — LISINOPRIL 40 MG PO TABS: 40.0000 mg | ORAL_TABLET | Freq: Every day | ORAL | 2 refills | Status: DC

## 2016-05-25 MED ORDER — AMLODIPINE BESYLATE 10 MG PO TABS: 10.0000 mg | ORAL_TABLET | Freq: Every day | ORAL | 2 refills | Status: DC

## 2016-05-25 NOTE — Assessment & Plan Note (Signed)
BMI of 38.41. Recommend weight loss of approximately 5-10% of current body weight through nutrition and physical activity.Recommend increasing physical activity to 30 minutes of moderate level activity daily. Encourage nutritional intake that focuses on nutrient dense foods and is moderate, varied, and balanced and is low in saturated fats and processed/sugary foods. Continue to monitor.

## 2016-05-25 NOTE — Patient Instructions (Addendum)
Thank you for choosing [facility].  Summary/Instructions:  Increase dose of lisinopril to 40 mg daily.  Continue current dosage of chlorthalidone.  Start taking AMLODIPINE.  Follow a low sodium diet.   Wear your CPAP at night.   Continue walking.   Your prescription(s) have been submitted to your pharmacy or been printed and provided for you. Please take as directed and contact our office if you believe you are having problem(s) with the medication(s) or have any questions.  If your symptoms worsen or fail to improve, please contact our office for further instruction, or in case of emergency go directly to the emergency room at the closest medical facility.    DASH Eating Plan DASH stands for "Dietary Approaches to Stop Hypertension." The DASH eating plan is a healthy eating plan that has been shown to reduce high blood pressure (hypertension). Additional health benefits may include reducing the risk of type 2 diabetes mellitus, heart disease, and stroke. The DASH eating plan may also help with weight loss. WHAT DO I NEED TO KNOW ABOUT THE DASH EATING PLAN? For the DASH eating plan, you will follow these general guidelines:  Choose foods with a percent daily value for sodium of less than 5% (as listed on the food label).  Use salt-free seasonings or herbs instead of table salt or sea salt.  Check with your health care provider or pharmacist before using salt substitutes.  Eat lower-sodium products, often labeled as "lower sodium" or "no salt added."  Eat fresh foods.  Eat more vegetables, fruits, and low-fat dairy products.  Choose whole grains. Look for the word "whole" as the first word in the ingredient list.  Choose fish and skinless chicken or [place] more often than red meat. Limit fish, poultry, and meat to 6 oz (170 g) each day.  Limit sweets, desserts, sugars, and sugary drinks.  Choose heart-healthy fats.  Limit cheese to 1 oz (28 g) per day.  Eat more  home-cooked food and less restaurant, buffet, and fast food.  Limit fried foods.  Cook foods using methods other than frying.  Limit canned vegetables. If you do use them, rinse them well to decrease the sodium.  When eating at a restaurant, ask that your food be prepared with less salt, or no salt if possible. WHAT FOODS CAN I EAT? Seek help from a dietitian for individual calorie needs. Grains Whole grain or whole wheat bread. Brown rice. Whole grain or whole wheat pasta. Quinoa, bulgur, and whole grain cereals. Low-sodium cereals. Corn or whole wheat flour tortillas. Whole grain cornbread. Whole grain crackers. Low-sodium crackers. Vegetables Fresh or frozen vegetables (raw, steamed, roasted, or grilled). Low-sodium or reduced-sodium tomato and vegetable juices. Low-sodium or reduced-sodium tomato sauce and paste. Low-sodium or reduced-sodium canned vegetables.  Fruits All fresh, canned (in natural juice), or frozen fruits. Meat and Other Protein Products Ground beef (85% or leaner), grass-fed beef, or beef trimmed of fat. Skinless chicken or [place]. Ground chicken or [place]. Pork trimmed of fat. All fish and seafood. Eggs. Dried beans, peas, or lentils. Unsalted nuts and seeds. Unsalted canned beans. Dairy Low-fat dairy products, such as skim or 1% milk, 2% or reduced-fat cheeses, low-fat ricotta or cottage cheese, or plain low-fat yogurt. Low-sodium or reduced-sodium cheeses. Fats and Oils Tub margarines without trans fats. Light or reduced-fat mayonnaise and salad dressings (reduced sodium). Avocado. Safflower, olive, or canola oils. Natural peanut or almond butter. Other Unsalted popcorn and pretzels. The items listed above may not be a complete  list of recommended foods or beverages. Contact your dietitian for more options. WHAT FOODS ARE NOT RECOMMENDED? Grains White bread. White pasta. White rice. Refined cornbread. Bagels and croissants. Crackers that contain trans  fat. Vegetables Creamed or fried vegetables. Vegetables in a cheese sauce. Regular canned vegetables. Regular canned tomato sauce and paste. Regular tomato and vegetable juices. Fruits Dried fruits. Canned fruit in light or heavy syrup. Fruit juice. Meat and Other Protein Products Fatty cuts of meat. Ribs, chicken wings, bacon, sausage, bologna, salami, chitterlings, fatback, hot dogs, bratwurst, and packaged luncheon meats. Salted nuts and seeds. Canned beans with salt. Dairy Whole or 2% milk, cream, half-and-half, and cream cheese. Whole-fat or sweetened yogurt. Full-fat cheeses or blue cheese. Nondairy creamers and whipped toppings. Processed cheese, cheese spreads, or cheese curds. Condiments Onion and garlic salt, seasoned salt, table salt, and sea salt. Canned and packaged gravies. Worcestershire sauce. Tartar sauce. Barbecue sauce. Teriyaki sauce. Soy sauce, including reduced sodium. Steak sauce. Fish sauce. Oyster sauce. Cocktail sauce. Horseradish. Ketchup and mustard. Meat flavorings and tenderizers. Bouillon cubes. Hot sauce. Tabasco sauce. Marinades. Taco seasonings. Relishes. Fats and Oils Butter, stick margarine, lard, shortening, ghee, and bacon fat. Coconut, palm kernel, or palm oils. Regular salad dressings. Other Pickles and olives. Salted popcorn and pretzels. The items listed above may not be a complete list of foods and beverages to avoid. Contact your dietitian for more information. WHERE CAN I FIND MORE INFORMATION? National Heart, Lung, and Blood Institute: [employer]   This information is not intended to replace advice given to you by your health care provider. Make sure you discuss any questions you have with your health care provider.   Document Released: 10/08/2011 Document Revised: 11/09/2014 Document Reviewed: 08/23/2013 Elsevier Interactive Patient Education [employer]

## 2016-05-25 NOTE — Progress Notes (Signed)
Subjective:    Patient ID: Steven Chase, male    DOB: [birth year], 30 y.o.   MRN: [id number]  Chief Complaint  Patient presents with  . Follow-up    needs refills of BP medications    HPI:  Steven Chase is a 30 y.o. male who  has a past medical history of Hypertension. and presents today for an office follow up.  1.) Hypertension - This is a chronic problem. Currently maintained on chlorthalidone and lisinopril. Reports taking the medications as prescribed and denies adverse side effects. No headaches or changes in vision. Not currently monitor your blood pressure at home. Indicates that he is following a low sodium diet. Not currently using his CPAP every night.   BP Readings from Last 3 Encounters:  05/25/16 (!) 168/130  08/21/15 (!) 144/90  08/16/15 (!) 175/132    No Known Allergies   Outpatient Medications Prior to Visit  Medication Sig Dispense Refill  . naproxen (NAPROSYN) 500 MG tablet Take 1 tablet (500 mg total) by mouth 2 (two) times daily. 30 tablet 0  . chlorthalidone (HYGROTON) 25 MG tablet Take 1 tablet (25 mg total) by mouth daily. 30 tablet 0  . lisinopril (PRINIVIL,ZESTRIL) 10 MG tablet Take 1 tablet (10 mg total) by mouth daily. 30 tablet 0  . traMADol (ULTRAM) 50 MG tablet Take 1 tablet (50 mg total) by mouth every 6 (six) hours as needed. 15 tablet 0   No facility-administered medications prior to visit.     Review of Systems  Constitutional: Negative for chills and fever.  Eyes:       Negative for changes in vision  Respiratory: Negative for cough, chest tightness and wheezing.   Cardiovascular: Negative for chest pain, palpitations and leg swelling.  Neurological: Negative for dizziness, weakness and light-headedness.      Objective:    BP (!) 168/130 (BP Location: Left Arm, Patient Position: Sitting, Cuff Size: Large)   Pulse (!) 103   Temp 97.8 F (36.6 C) (Oral)   Resp 18   Ht 5\' 6"  (1.676 m)   Wt 238 lb (108 kg)   SpO2 95%   BMI  38.41 kg/m  Nursing note and vital signs reviewed.  Physical Exam  Constitutional: He is oriented to person, place, and time. He appears well-developed and well-nourished. No distress.  Cardiovascular: Normal rate, regular rhythm, normal heart sounds and intact distal pulses.   Pulmonary/Chest: Effort normal and breath sounds normal.  Neurological: He is alert and oriented to person, place, and time.  Skin: Skin is warm and dry.  Psychiatric: He has a normal mood and affect. His behavior is normal. Judgment and thought content normal.       Assessment & Plan:   Problem List Items Addressed This Visit      Cardiovascular and Mediastinum   Essential hypertension - Primary    Blood pressure remains above goal 140/90 with current regimen and question patient compliance with medication regimen. He has been less than compliant with his CPAP regimen which may be contributing to his elevated blood pressure. Encouraged weight loss and low-sodium diet. No symptoms of end organ damage or worse headache of life. Continue current dosage of chlorthalidone. Increase lisinopril and start amlodipine. Continue to monitor blood pressure at home. Follow-up in 2 weeks for nurse visit.      Relevant Medications   lisinopril (PRINIVIL,ZESTRIL) 40 MG tablet   amLODipine (NORVASC) 10 MG tablet   chlorthalidone (HYGROTON) 25 MG tablet  Other   Obesity    BMI of 38.41. Recommend weight loss of approximately 5-10% of current body weight through nutrition and physical activity.Recommend increasing physical activity to 30 minutes of moderate level activity daily. Encourage nutritional intake that focuses on nutrient dense foods and is moderate, varied, and balanced and is low in saturated fats and processed/sugary foods. Continue to monitor.       Other Visit Diagnoses   None.      I have discontinued Mr. Poche traMADol. I have also changed his lisinopril. Additionally, I am having him start on  amLODipine. Lastly, I am having him maintain his naproxen and chlorthalidone.   Meds ordered this encounter  Medications  . lisinopril (PRINIVIL,ZESTRIL) 40 MG tablet    Sig: Take 1 tablet (40 mg total) by mouth daily.    Dispense:  30 tablet    Refill:  2    Limited quantity until appointment    Order Specific Question:   Supervising Provider    Answer:   Hillard Danker A [[id number]]  . amLODipine (NORVASC) 10 MG tablet    Sig: Take 1 tablet (10 mg total) by mouth daily.    Dispense:  30 tablet    Refill:  2    Order Specific Question:   Supervising Provider    Answer:   Hillard Danker A [[id number]]  . chlorthalidone (HYGROTON) 25 MG tablet    Sig: Take 1 tablet (25 mg total) by mouth daily.    Dispense:  30 tablet    Refill:  2    Order Specific Question:   Supervising Provider    Answer:   Hillard Danker A [[id number]]     Follow-up: Return in about 2 weeks (around 06/08/2016), or [facility] visit.  Jeanine Luz, FNP

## 2016-05-25 NOTE — Assessment & Plan Note (Signed)
Blood pressure remains above goal 140/90 with current regimen and question patient compliance with medication regimen. He has been less than compliant with his CPAP regimen which may be contributing to his elevated blood pressure. Encouraged weight loss and low-sodium diet. No symptoms of end organ damage or worse headache of life. Continue current dosage of chlorthalidone. Increase lisinopril and start amlodipine. Continue to monitor blood pressure at home. Follow-up in 2 weeks for nurse visit.

## 2016-06-20 ENCOUNTER — Emergency Department (HOSPITAL_COMMUNITY)
Admission: EM | Admit: 2016-06-20 | Discharge: 2016-06-20 | Disposition: A | Discharge status: Home / Self Care | Payer: Self-pay | Attending: Emergency Medicine | Admitting: Emergency Medicine

## 2016-06-20 ENCOUNTER — Encounter (HOSPITAL_COMMUNITY): Payer: Self-pay | Admitting: Emergency Medicine

## 2016-06-20 DIAGNOSIS — I10 Essential (primary) hypertension: Secondary | ICD-10-CM | POA: Insufficient documentation

## 2016-06-20 DIAGNOSIS — L259 Unspecified contact dermatitis, unspecified cause: Secondary | ICD-10-CM | POA: Insufficient documentation

## 2016-06-20 MED ORDER — HYDROCORTISONE 1 % EX CREA: TOPICAL_CREAM | CUTANEOUS | 0 refills | Status: DC

## 2016-06-20 MED ORDER — DIPHENHYDRAMINE HCL 25 MG PO CAPS
25.0000 mg | ORAL_CAPSULE | Freq: Four times a day (QID) | ORAL | 0 refills | Status: DC | PRN
Start: 2016-06-20 — End: 2016-10-29

## 2016-06-20 NOTE — ED Provider Notes (Signed)
[facility]-EMERGENCY DEPT Provider Note   CSN: [id number] Arrival date & time: 06/20/16  0813     History   Chief Complaint Chief Complaint  Patient presents with  . Rash  . Hypertension    HPI Steven Chase is a 30 y.o. male.  HPI  30 year old male with pruritic rash. Onset yesterday. Noticed shortly after doing some work outside. Rash contained to the right forearm. No dizziness, lightheadedness or shortness of breath. No GI complaints. No acute respiratory complaints. No fevers or chills. Has tried taking Benadryl which has helped with itching. He has bathed since symptoms started. Hypertension noted. He has a history the same. He did not take his blood pressure medications yet this morning.  Past Medical History:  Diagnosis Date  . Hypertension     Patient Active Problem List   Diagnosis Date Noted  . Obesity 05/25/2016  . Lower leg edema 12/12/2014  . Essential hypertension 11/05/2014  . OSA (obstructive sleep apnea) 11/05/2014    Past Surgical History:  Procedure Laterality Date  . HERNIA REPAIR         Home Medications    Prior to Admission medications   Medication Sig Start Date End Date Taking? Authorizing Provider  amLODipine (NORVASC) 10 MG tablet Take 1 tablet (10 mg total) by mouth daily. 05/25/16   Veryl Speak, FNP  chlorthalidone (HYGROTON) 25 MG tablet Take 1 tablet (25 mg total) by mouth daily. 05/25/16   Veryl Speak, FNP  lisinopril (PRINIVIL,ZESTRIL) 40 MG tablet Take 1 tablet (40 mg total) by mouth daily. 05/25/16   Veryl Speak, FNP  naproxen (NAPROSYN) 500 MG tablet Take 1 tablet (500 mg total) by mouth 2 (two) times daily. 08/16/15   Gilda Crease, MD    Family History Family History  Problem Relation Age of Onset  . Hypertension Mother   . Heart disease Maternal Grandfather   . Hypertension Maternal Grandfather     Social History Social History  Substance Use Topics  . Smoking status: Never Smoker  . Smokeless  tobacco: Never Used  . Alcohol use No     Allergies   Review of patient's allergies indicates no known allergies.   Review of Systems Review of Systems  All systems reviewed and negative, other than as noted in HPI.   Physical Exam Updated Vital Signs BP (!) 187/130   Pulse 93   Temp 97.9 F (36.6 C)   Resp 18   Ht 5\' 6"  (1.676 m)   Wt 237 lb 1 oz (107.5 kg)   SpO2 99%   BMI 38.26 kg/m   Physical Exam  Constitutional: He appears well-developed and well-nourished.  HENT:  Head: Normocephalic and atraumatic.  Eyes: Conjunctivae are normal.  Neck: Neck supple.  Cardiovascular: Normal rate and regular rhythm.   No murmur heard. Pulmonary/Chest: Effort normal and breath sounds normal. No respiratory distress.  Abdominal: Soft. There is no tenderness.  Musculoskeletal: He exhibits no edema.  Neurological: He is alert.  Skin: Skin is warm and dry.     Linear streak of tiny vesicles to the volar aspect of the right forearm. Several areas of scattered vesicles surrounding it. No drainage. No cellulitic changes. No significant swelling. Neurovascular intact.  Psychiatric: He has a normal mood and affect.  Nursing note and vitals reviewed.    ED Treatments / Results  Labs (all labs ordered are listed, but only abnormal results are displayed) Labs Reviewed - No data to display  EKG  EKG  Interpretation None       Radiology No results found.  Procedures Procedures (including critical care time)  Medications Ordered in ED Medications - No data to display   Initial Impression / Assessment and Plan / ED Course  I have reviewed the triage vital signs and the nursing notes.  Pertinent labs & imaging results that were available during my care of the patient were reviewed by me and considered in my medical decision making (see chart for details).  Clinical Course    30 year old male with pruritic rash. Symptoms and exam are consistent with symptoms of contact  dermatitis. He has a linear streak of tiny vesicles on the volar aspect of his right forearm. Has a couple scattered tiny vesicles or illness. Denies any systemic symptoms. At this point plan as needed antihistamines and topical steroids. I do not feel that the extent of involvement warrants systemic steroids. He was noted to be hypertensive on the emergency room. He has no complaints which I could directly attribute to this. He did not take his blood pressure medication this morning.   Final Clinical Impressions(s) / ED Diagnoses   Final diagnoses:  None    New Prescriptions New Prescriptions   No medications on file     Raeford RazorStephen Jiya Kissinger, MD 06/20/16 [phone]

## 2016-06-20 NOTE — ED Triage Notes (Signed)
Pt c/o rash to right arm noted today. Pt reports itching. Rash is raised white bumps.

## 2016-06-20 NOTE — ED Notes (Addendum)
Patient here to be evaluated for rash on right arm. Patient has a string of small raised vesicles on right forearm that he states he noticed yesterday after doing yard work. Patient denies pain and endorses mild itching. Patient has tried benadryl which reduced itching sensation. Patient sts rash has not increased or decreased since yesterday.   Pt denies headache, weakness or chest pain. Triage nurse noted pt to be hypertensive in triage. Patient states he forgot to take his blood pressure medications this morning. Amlodipine 10mg  and lisinopril 40mg  PO

## 2016-06-22 ENCOUNTER — Emergency Department (HOSPITAL_COMMUNITY)
Admission: EM | Admit: 2016-06-22 | Discharge: 2016-06-22 | Disposition: A | Discharge status: Home / Self Care | Payer: Self-pay | Attending: Emergency Medicine | Admitting: Emergency Medicine

## 2016-06-22 DIAGNOSIS — Z791 Long term (current) use of non-steroidal anti-inflammatories (NSAID): Secondary | ICD-10-CM | POA: Insufficient documentation

## 2016-06-22 DIAGNOSIS — I10 Essential (primary) hypertension: Secondary | ICD-10-CM | POA: Insufficient documentation

## 2016-06-22 DIAGNOSIS — L237 Allergic contact dermatitis due to plants, except food: Secondary | ICD-10-CM | POA: Insufficient documentation

## 2016-06-22 DIAGNOSIS — Z79899 Other long term (current) drug therapy: Secondary | ICD-10-CM | POA: Insufficient documentation

## 2016-06-22 MED ORDER — PREDNISONE 10 MG (21) PO TBPK: 10.0000 mg | ORAL_TABLET | Freq: Every day | ORAL | 0 refills | Status: DC

## 2016-06-22 NOTE — ED Notes (Addendum)
Pt complains of itching since Friday. States he was seen at [facility] on Friday and they told him he had poison ivy and to get some over the counter cream however same is not working. Pt has itching to the left side of his neck and right forearm. Pt has small bumps and a small dry spot to his right forearm but nothing noticeable on his neck.   Pt takes htn medications and took same this morning.

## 2016-06-22 NOTE — ED Provider Notes (Signed)
[facility] Provider Note   CSN: [id number] Arrival date & time: 06/22/16  [id number]     History   Chief Complaint Chief Complaint  Patient presents with  . Rash    HPI Steven Chase is a 30 y.o. male.  HPI 30 year old male who presents with pruritic rash to bilateral upper extremities, right greater than left. The patient was working in his yard approximately a week ago when he was exposed known poison ivy. He presented to the ED for evaluation shortly thereafter. On 8/19, he was seen and diagnosed with contact dermatitis. He was given topical steroids and Benadryl. He states he's been taking this with no improvement in his rash. He endorses persistent pruritus and mild stinging pain at his rash. Denies any increasing redness or swelling. Denies any spread of the rash. Denies any shortness of breath or wheezing. No other medical complaints. He is not diabetic or immune suppressed  Past Medical History:  Diagnosis Date  . Hypertension     Patient Active Problem List   Diagnosis Date Noted  . Obesity 05/25/2016  . Lower leg edema 12/12/2014  . Essential hypertension 11/05/2014  . OSA (obstructive sleep apnea) 11/05/2014    Past Surgical History:  Procedure Laterality Date  . HERNIA REPAIR         Home Medications    Prior to Admission medications   Medication Sig Start Date End Date Taking? Authorizing Provider  amLODipine (NORVASC) 10 MG tablet Take 1 tablet (10 mg total) by mouth daily. 05/25/16   Veryl SpeakGregory D Calone, FNP  chlorthalidone (HYGROTON) 25 MG tablet Take 1 tablet (25 mg total) by mouth daily. 05/25/16   Veryl SpeakGregory D Calone, FNP  diphenhydrAMINE (BENADRYL) 25 mg capsule Take 1 capsule (25 mg total) by mouth every 6 (six) hours as needed for itching. 06/20/16   Raeford RazorStephen Kohut, MD  hydrocortisone cream 1 % Apply to affected area 2 times daily PRN rash 06/20/16   Raeford RazorStephen Kohut, MD  lisinopril (PRINIVIL,ZESTRIL) 40 MG tablet Take 1 tablet (40 mg total) by mouth  daily. 05/25/16   Veryl SpeakGregory D Calone, FNP  naproxen (NAPROSYN) 500 MG tablet Take 1 tablet (500 mg total) by mouth 2 (two) times daily. 08/16/15   Gilda Creasehristopher J Pollina, MD  predniSONE (STERAPRED UNI-PAK 21 TAB) 10 MG (21) TBPK tablet Take 1 tablet (10 mg total) by mouth daily. Take 6 tabs by mouth daily  for 2 days, then 5 tabs for 2 days, then 4 tabs for 2 days, then 3 tabs for 2 days, 2 tabs for 2 days, then 1 tab by mouth daily for 2 days 06/22/16   [name], MD    Family History Family History  Problem Relation Age of Onset  . Hypertension Mother   . Heart disease Maternal Grandfather   . Hypertension Maternal Grandfather     Social History Social History  Substance Use Topics  . Smoking status: Never Smoker  . Smokeless tobacco: Never Used  . Alcohol use No     Allergies   Review of patient's allergies indicates no known allergies.   Review of Systems Review of Systems  Constitutional: Negative for chills, fatigue and fever.  HENT: Negative for congestion and rhinorrhea.   Eyes: Negative for visual disturbance.  Respiratory: Negative for cough, shortness of breath and wheezing.   Cardiovascular: Negative for chest pain and leg swelling.  Gastrointestinal: Negative for abdominal pain, diarrhea, nausea and vomiting.  Genitourinary: Negative for dysuria and flank pain.  Musculoskeletal: Negative  for neck pain and neck stiffness.  Skin: Negative for rash and wound.  Allergic/Immunologic: Negative for immunocompromised state.  Neurological: Negative for syncope, weakness and headaches.  All other systems reviewed and are negative.    Physical Exam Updated Vital Signs BP (!) 174/129 (BP Location: Left Arm)   Pulse 100   Temp 98.4 F (36.9 C) (Oral)   Resp 18   SpO2 97%   Physical Exam  Constitutional: He is oriented to person, place, and time. He appears well-developed and well-nourished. No distress.  HENT:  Head: Normocephalic and atraumatic.  Eyes:  Conjunctivae are normal.  Neck: Neck supple.  Cardiovascular: Normal rate, regular rhythm and normal heart sounds.  Exam reveals no friction rub.   No murmur heard. Pulmonary/Chest: Effort normal and breath sounds normal. No respiratory distress. He has no wheezes. He has no rales.  Abdominal: He exhibits no distension.  Musculoskeletal: He exhibits no edema.  Neurological: He is alert and oriented to person, place, and time. He exhibits normal muscle tone.  Skin: Capillary refill takes less than 2 seconds. Rash (Linear, primarily papular erythematous rash across bilateral forearms, worse on right greater than left. No surrounding induration or fluctuance. No drainage.) noted.  Psychiatric: He has a normal mood and affect.  Nursing note and vitals reviewed.    ED Treatments / Results  Labs (all labs ordered are listed, but only abnormal results are displayed) Labs Reviewed - No data to display  EKG  EKG Interpretation None       Radiology No results found.  Procedures Procedures (including critical care time)  Medications Ordered in ED Medications - No data to display   Initial Impression / Assessment and Plan / ED Course  I have reviewed the triage vital signs and the nursing notes.  Pertinent labs & imaging results that were available during my care of the patient were reviewed by me and considered in my medical decision making (see chart for details).  Clinical Course  30 year old male with no significant past medical history presents with linear pruritic rash to the bilateral forearms after known poison ivy exposure. Rash is consistent with ongoing contact dermatitis. He has no evidence of worsening erythema, induration, fluctuance, or other signs of superimposed cellulitis or abscess. His topical steroids have not been resolving his rash. Discussed the risks and benefits of systemic steroids at the patient and will start him on a prednisone taper, advised to follow up  with his PCP. Side effects of steroids discussed in detail. Patient is in agreement with this plan. Of note, the patient is hypertensive on evaluation as well. This is baseline per review of records. He has a history of chronic nonadherence with his blood pressure medications. He has no headache, chest pain, shortness of breath, or signs of hypertensive urgency. I advised him to take his medications at home and follow-up with his doctor regarding his uncontrolled hypertension.  Final Clinical Impressions(s) / ED Diagnoses   Final diagnoses:  Poison ivy dermatitis    New Prescriptions Discharge Medication List as of 06/22/2016  8:12 AM    START taking these medications   Details  predniSONE (STERAPRED UNI-PAK 21 TAB) 10 MG (21) TBPK tablet Take 1 tablet (10 mg total) by mouth daily. Take 6 tabs by mouth daily  for 2 days, then 5 tabs for 2 days, then 4 tabs for 2 days, then 3 tabs for 2 days, 2 tabs for 2 days, then 1 tab by mouth daily for 2 days, Starting  Mon 06/22/2016, Print         Shaune Pollack, MD 06/23/16 [phone]

## 2016-06-22 NOTE — ED Triage Notes (Signed)
Pt started having rash on Friday.  Rash to arm and neck. Went to [facility] and told to take OTC creams.  Not working.

## 2016-07-01 ENCOUNTER — Encounter (HOSPITAL_COMMUNITY): Payer: Self-pay | Admitting: Emergency Medicine

## 2016-07-01 ENCOUNTER — Emergency Department (HOSPITAL_COMMUNITY)
Admission: EM | Admit: 2016-07-01 | Discharge: 2016-07-01 | Disposition: A | Discharge status: Home / Self Care | Payer: Self-pay | Attending: Emergency Medicine | Admitting: Emergency Medicine

## 2016-07-01 DIAGNOSIS — I10 Essential (primary) hypertension: Secondary | ICD-10-CM | POA: Insufficient documentation

## 2016-07-01 DIAGNOSIS — Z79899 Other long term (current) drug therapy: Secondary | ICD-10-CM | POA: Insufficient documentation

## 2016-07-01 DIAGNOSIS — R369 Urethral discharge, unspecified: Secondary | ICD-10-CM | POA: Insufficient documentation

## 2016-07-01 DIAGNOSIS — R350 Frequency of micturition: Secondary | ICD-10-CM | POA: Insufficient documentation

## 2016-07-01 DIAGNOSIS — R300 Dysuria: Secondary | ICD-10-CM | POA: Insufficient documentation

## 2016-07-01 LAB — URINE MICROSCOPIC-ADD ON

## 2016-07-01 LAB — URINALYSIS, ROUTINE W REFLEX MICROSCOPIC
Bilirubin Urine: NEGATIVE
Glucose, UA: NEGATIVE mg/dL
Ketones, ur: NEGATIVE mg/dL
Nitrite: NEGATIVE
Protein, ur: 30 mg/dL — AB
Specific Gravity, Urine: 1.014 (ref 1.005–1.030)
pH: 7 (ref 5.0–8.0)

## 2016-07-01 MED ORDER — AZITHROMYCIN 250 MG PO TABS
1000.0000 mg | ORAL_TABLET | Freq: Once | ORAL | Status: AC
  Administered 2016-07-01: 1000 mg via ORAL
  Filled 2016-07-01: qty 4

## 2016-07-01 MED ORDER — STERILE WATER FOR INJECTION IJ SOLN
INTRAMUSCULAR | Status: AC
  Administered 2016-07-01: 10 mL
  Filled 2016-07-01: qty 10

## 2016-07-01 MED ORDER — CEFTRIAXONE SODIUM 250 MG IJ SOLR
250.0000 mg | Freq: Once | INTRAMUSCULAR | Status: AC
  Administered 2016-07-01: 250 mg via INTRAMUSCULAR
  Filled 2016-07-01: qty 250

## 2016-07-01 MED ORDER — CEPHALEXIN 500 MG PO CAPS: 500.0000 mg | ORAL_CAPSULE | Freq: Two times a day (BID) | ORAL | 0 refills | Status: DC

## 2016-07-01 NOTE — ED Notes (Signed)
Educated patient the importance of taking his BP medication and following up with primary care doctor.  Patient stated he is going to make an appointment with his MD regarding BP meds.  He states he has medication at home.

## 2016-07-01 NOTE — ED Notes (Signed)
MD at bedside. 

## 2016-07-01 NOTE — ED Notes (Signed)
Patient understood discharge instructions.  He is A & O.

## 2016-07-01 NOTE — ED Triage Notes (Signed)
Patient c/o burning with urination that started today. Patient denies any blood or foul odor in urination.

## 2016-07-01 NOTE — ED Provider Notes (Signed)
[facility]-EMER[facility]ENCY DEPT Provider Note   CSN: [id number] Arrival date & time: 07/01/16  1045     History   Chief Complaint Chief Complaint  Patient presents with  . Dysuria    HPI Steven Chase is a 30 y.o. male.  Patient is a 30 year old male with past medical history of hypertension who presents the ED with complaint of dysuria, onset this morning. Patient reports having urinary frequency and dysuria today. He also reports noticing a small amount of clear drainage in his penis. Patient reports she is sexually active with one partner, reports using condoms intermittently but states that he is not concerned about STD exposure. Denies fever, chills, abdominal pain, flank pain, nausea, vomiting, diarrhea, hematuria, urinary retention, penile or testicular pain/swelling, rash. Denies taking any medications prior to arrival.      Past Medical History:  Diagnosis Date  . Hypertension     Patient Active Problem List   Diagnosis Date Noted  . Obesity 05/25/2016  . Lower leg edema 12/12/2014  . Essential hypertension 11/05/2014  . OSA (obstructive sleep apnea) 11/05/2014    Past Surgical History:  Procedure Laterality Date  . HERNIA REPAIR         Home Medications    Prior to Admission medications   Medication Sig Start Date End Date Taking? Authorizing Provider  amLODipine (NORVASC) 10 MG tablet Take 1 tablet (10 mg total) by mouth daily. 05/25/16  Yes Veryl Speak, FNP  chlorthalidone (HYGROTON) 25 MG tablet Take 1 tablet (25 mg total) by mouth daily. 05/25/16  Yes Veryl Speak, FNP  lisinopril (PRINIVIL,ZESTRIL) 40 MG tablet Take 1 tablet (40 mg total) by mouth daily. 05/25/16  Yes Veryl Speak, FNP  predniSONE (STERAPRED UNI-PAK 21 TAB) 10 MG (21) TBPK tablet Take 1 tablet (10 mg total) by mouth daily. Take 6 tabs by mouth daily  for 2 days, then 5 tabs for 2 days, then 4 tabs for 2 days, then 3 tabs for 2 days, 2 tabs for 2 days, then 1 tab by mouth daily for 2  days 06/22/16  Yes Shaune Pollack, MD  cephALEXin (KEFLEX) 500 MG capsule Take 1 capsule (500 mg total) by mouth 2 (two) times daily. 07/01/16   Barrett Henle, PA-C  diphenhydrAMINE (BENADRYL) 25 mg capsule Take 1 capsule (25 mg total) by mouth every 6 (six) hours as needed for itching. Patient not taking: Reported on 07/01/2016 06/20/16   Raeford Razor, MD  hydrocortisone cream 1 % Apply to affected area 2 times daily PRN rash Patient not taking: Reported on 07/01/2016 06/20/16   Raeford Razor, MD  naproxen (NAPROSYN) 500 MG tablet Take 1 tablet (500 mg total) by mouth 2 (two) times daily. Patient not taking: Reported on 07/01/2016 08/16/15   Gilda Crease, MD    Family History Family History  Problem Relation Age of Onset  . Hypertension Mother   . Heart disease Maternal Grandfather   . Hypertension Maternal Grandfather     Social History Social History  Substance Use Topics  . Smoking status: Never Smoker  . Smokeless tobacco: Never Used  . Alcohol use No     Allergies   Review of patient's allergies indicates no known allergies.   Review of Systems Review of Systems  Genitourinary: Positive for discharge, dysuria and frequency.  All other systems reviewed and are negative.    Physical Exam Updated Vital Signs BP (!) 182/132 (BP Location: Left Arm)   Pulse 110   Temp 98.7  F (37.1 C) (Oral)   Resp 19   Ht 5\' 6"  (1.676 m)   Wt 107.5 kg   SpO2 97%   BMI 38.25 kg/m   Physical Exam  Constitutional: He is oriented to person, place, and time. He appears well-developed and well-nourished. No distress.  HENT:  Head: Normocephalic and atraumatic.  Mouth/Throat: Oropharynx is clear and moist. No oropharyngeal exudate.  Eyes: Conjunctivae and EOM are normal. Right eye exhibits no discharge. Left eye exhibits no discharge. No scleral icterus.  Neck: Normal range of motion. Neck supple.  Cardiovascular: Normal rate, regular rhythm, normal heart sounds and  intact distal pulses.   Pulmonary/Chest: Effort normal and breath sounds normal. No respiratory distress. He has no wheezes. He has no rales. He exhibits no tenderness.  Abdominal: Soft. Bowel sounds are normal. He exhibits no distension and no mass. There is no tenderness. There is no rebound and no guarding. No hernia. Hernia confirmed negative in the right inguinal area and confirmed negative in the left inguinal area.  Genitourinary: Testes normal. Right testis shows no mass, no swelling and no tenderness. Left testis shows no mass, no swelling and no tenderness. Uncircumcised. No phimosis, paraphimosis, hypospadias, penile erythema or penile tenderness. Discharge (white purulent) found.  Musculoskeletal: He exhibits no edema.  Lymphadenopathy: No inguinal adenopathy noted on the right or left side.  Neurological: He is alert and oriented to person, place, and time.  Skin: Skin is warm and dry. He is not diaphoretic.  Nursing note and vitals reviewed.    ED Treatments / Results  Labs (all labs ordered are listed, but only abnormal results are displayed) Labs Reviewed  URINALYSIS, ROUTINE W REFLEX MICROSCOPIC ([facility]) - Abnormal; Notable for the following:       Result Value   APPearance CLOUDY (*)    Hgb urine dipstick MODERATE (*)    Protein, ur 30 (*)    Leukocytes, UA LARGE (*)    All other components within normal limits  URINE MICROSCOPIC-ADD ON - Abnormal; Notable for the following:    Squamous Epithelial / LPF 0-5 (*)    Bacteria, UA FEW (*)    All other components within normal limits  URINE CULTURE  RPR  HIV ANTIBODY (ROUTINE TESTING)  GC/CHLAMYDIA PROBE AMP ([place]) NOT AT [facility]    EKG  EKG Interpretation None       Radiology No results found.  Procedures Procedures (including critical care time)  Medications Ordered in ED Medications  cefTRIAXone (ROCEPHIN) injection 250 mg (not administered)  azithromycin (ZITHROMAX) tablet 1,000 mg (not  administered)     Initial Impression / Assessment and Plan / ED Course  I have reviewed the triage vital signs and the nursing notes.  Pertinent labs & imaging results that were available during my care of the patient were reviewed by me and considered in my medical decision making (see chart for details).  Clinical Course    Patient is afebrile without abdominal tenderness, abdominal pain or painful bowel movements to indicate prostatitis.  No tenderness to palpation of the testes or epididymis to suggest orchitis or epididymitis.  STD cultures obtained including HIV, syphilis, gonorrhea and chlamydia. Patient to be discharged with instructions to follow up with PCP. Discussed importance of using protection when sexually active. Pt understands that they have GC/Chlamydia cultures pending and that they will need to inform all sexual partners if results return positive. Patient has been treated prophylactically with azithromycin and Rocephin. UA showed moderate hgb, large  leuks, TNTC WBCs and few bacteria; urine culture sent. Will also send pt home with keflex for suspected UTI. Discussed return precautions with pt.      Final Clinical Impressions(s) / ED Diagnoses   Final diagnoses:  Dysuria    New Prescriptions New Prescriptions   CEPHALEXIN (KEFLEX) 500 MG CAPSULE    Take 1 capsule (500 mg total) by mouth 2 (two) times daily.     Satira Sark [name], [place] 07/01/16 1309    Azalia Bilis, MD 07/01/16 [phone]

## 2016-07-01 NOTE — Discharge Instructions (Signed)
1. Medications: usual home medications 2. Treatment: rest, drink plenty of fluids, use a condom with every sexual encounter, take your antibiotic as prescribed for your urinary tract infection 3. Follow Up: Please followup with your primary doctor in 3 days for discussion of your diagnoses and further evaluation after today's visit; if you do not have a primary care doctor use the resource guide provided to find one; Please return to the ER for worsening symptoms, high fevers or persistent vomiting.  You have been tested for HIV, syphilis, chlamydia and gonorrhea.  These results will be available in approximately 3 days.  Please inform all sexual partners if you test positive for any of these diseases.

## 2016-07-02 LAB — URINE CULTURE: Culture: NO GROWTH

## 2016-07-02 LAB — GC/CHLAMYDIA PROBE AMP (~~LOC~~) NOT AT ARMC
CHLAMYDIA, DNA PROBE: POSITIVE — AB
Neisseria Gonorrhea: POSITIVE — AB

## 2016-07-02 LAB — HIV ANTIBODY (ROUTINE TESTING W REFLEX): HIV SCREEN 4TH GENERATION: NONREACTIVE

## 2016-07-02 LAB — RPR: RPR: NONREACTIVE

## 2016-07-03 ENCOUNTER — Telehealth (HOSPITAL_BASED_OUTPATIENT_CLINIC_OR_DEPARTMENT_OTHER): Payer: Self-pay | Admitting: Emergency Medicine

## 2016-09-18 ENCOUNTER — Emergency Department (HOSPITAL_COMMUNITY)
Admission: EM | Admit: 2016-09-18 | Discharge: 2016-09-18 | Disposition: A | Discharge status: Home / Self Care | Payer: Self-pay | Attending: Emergency Medicine | Admitting: Emergency Medicine

## 2016-09-18 ENCOUNTER — Emergency Department (HOSPITAL_COMMUNITY): Payer: Self-pay

## 2016-09-18 ENCOUNTER — Encounter (HOSPITAL_COMMUNITY): Payer: Self-pay | Admitting: Emergency Medicine

## 2016-09-18 DIAGNOSIS — Y929 Unspecified place or not applicable: Secondary | ICD-10-CM | POA: Insufficient documentation

## 2016-09-18 DIAGNOSIS — Z79899 Other long term (current) drug therapy: Secondary | ICD-10-CM | POA: Insufficient documentation

## 2016-09-18 DIAGNOSIS — W010XXA Fall on same level from slipping, tripping and stumbling without subsequent striking against object, initial encounter: Secondary | ICD-10-CM | POA: Insufficient documentation

## 2016-09-18 DIAGNOSIS — M25571 Pain in right ankle and joints of right foot: Secondary | ICD-10-CM | POA: Insufficient documentation

## 2016-09-18 DIAGNOSIS — Y999 Unspecified external cause status: Secondary | ICD-10-CM | POA: Insufficient documentation

## 2016-09-18 DIAGNOSIS — Y9389 Activity, other specified: Secondary | ICD-10-CM | POA: Insufficient documentation

## 2016-09-18 DIAGNOSIS — I10 Essential (primary) hypertension: Secondary | ICD-10-CM | POA: Insufficient documentation

## 2016-09-18 LAB — I-STAT CHEM 8, ED
BUN: 13 mg/dL (ref 6–20)
CALCIUM ION: 1.15 mmol/L (ref 1.15–1.40)
CREATININE: 1.4 mg/dL — AB (ref 0.61–1.24)
Chloride: 99 mmol/L — ABNORMAL LOW (ref 101–111)
GLUCOSE: 107 mg/dL — AB (ref 65–99)
HCT: 50 % (ref 39.0–52.0)
HEMOGLOBIN: 17 g/dL (ref 13.0–17.0)
Potassium: 2.9 mmol/L — ABNORMAL LOW (ref 3.5–5.1)
Sodium: 142 mmol/L (ref 135–145)
TCO2: 32 mmol/L (ref 0–100)

## 2016-09-18 MED ORDER — POTASSIUM CHLORIDE CRYS ER 20 MEQ PO TBCR
40.0000 meq | EXTENDED_RELEASE_TABLET | Freq: Once | ORAL | Status: AC
  Administered 2016-09-18: 40 meq via ORAL
  Filled 2016-09-18: qty 2

## 2016-09-18 MED ORDER — ACETAMINOPHEN 325 MG PO TABS
650.0000 mg | ORAL_TABLET | Freq: Once | ORAL | Status: AC
  Administered 2016-09-18: 650 mg via ORAL
  Filled 2016-09-18: qty 2

## 2016-09-18 MED ORDER — HYDRALAZINE HCL 10 MG PO TABS
10.0000 mg | ORAL_TABLET | Freq: Once | ORAL | Status: AC
  Administered 2016-09-18: 10 mg via ORAL
  Filled 2016-09-18: qty 1

## 2016-09-18 MED ORDER — HYDRALAZINE HCL 10 MG PO TABS: 10.0000 mg | ORAL_TABLET | Freq: Four times a day (QID) | ORAL | 0 refills | Status: DC

## 2016-09-18 MED ORDER — POTASSIUM CHLORIDE ER 10 MEQ PO TBCR: 20.0000 meq | EXTENDED_RELEASE_TABLET | Freq: Every day | ORAL | 0 refills | Status: DC

## 2016-09-18 MED ORDER — HYDRALAZINE HCL 25 MG PO TABS: 25.0000 mg | ORAL_TABLET | Freq: Four times a day (QID) | ORAL | 0 refills | Status: DC

## 2016-09-18 NOTE — ED Triage Notes (Signed)
Patient states ankle injury yesterday when he stepped in a hole.  Patient states he rolled it outwardly.  Patient denies other symptoms, but was tachycardic and hypertensive on arrival.   Patient states history of HTN, but did take medication this morning.

## 2016-09-18 NOTE — ED Notes (Signed)
Morrie Sheldon, [place] aware of asymptomatic BP.

## 2016-09-18 NOTE — ED Notes (Signed)
Pt stable, ambulatory, states understanding of discharge instructions 

## 2016-09-18 NOTE — ED Provider Notes (Signed)
[facility]-EMERGENCY DEPT Provider Note   CSN: [id number] Arrival date & time: 09/18/16  1452     History   Chief Complaint Chief Complaint  Patient presents with  . Ankle Injury  . Hypertension    HPI Steven Chase is a 30 y.o. male.  Steven Chase is a 30 y.o. male with h/o  OSA, HTN, obesity, lower extremity edema presents to ED with complaint of right ankle injury. Patient reports he was feeding his dogs last night when he stepped in a ditch rolling his right ankle. He reports associated pain and swelling. Describes pain as sharp. He been wearing an ankle brace and has taken advil with minimal relief. Denies fever, warmth, erythema. He is able to ambulate. Of note, patient found to be hypertensive and tachycardic on arrival. Pt has h/o HTN, reports taking his medication this morning. He denies trouble swallowing, changes in vision, chest pain, shortness of breath, abdominal pain, N/V, headache, dizziness, lightheadedness, numbness, weakness, LOC, facial droop, or slurred speech.       Past Medical History:  Diagnosis Date  . Hypertension     Patient Active Problem List   Diagnosis Date Noted  . Obesity 05/25/2016  . Lower leg edema 12/12/2014  . Essential hypertension 11/05/2014  . OSA (obstructive sleep apnea) 11/05/2014    Past Surgical History:  Procedure Laterality Date  . HERNIA REPAIR         Home Medications    Prior to Admission medications   Medication Sig Start Date End Date Taking? Authorizing Provider  amLODipine (NORVASC) 10 MG tablet Take 1 tablet (10 mg total) by mouth daily. 05/25/16   Veryl Speak, FNP  cephALEXin (KEFLEX) 500 MG capsule Take 1 capsule (500 mg total) by mouth 2 (two) times daily. 07/01/16   Barrett Henle, PA-C  chlorthalidone (HYGROTON) 25 MG tablet Take 1 tablet (25 mg total) by mouth daily. 05/25/16   Veryl Speak, FNP  diphenhydrAMINE (BENADRYL) 25 mg capsule Take 1 capsule (25 mg total) by mouth every 6 (six)  hours as needed for itching. Patient not taking: Reported on 07/01/2016 06/20/16   Raeford Razor, MD  hydrALAZINE (APRESOLINE) 10 MG tablet Take 1 tablet (10 mg total) by mouth 4 (four) times daily. 09/18/16 09/22/16  Lona Kettle, PA-C  hydrALAZINE (APRESOLINE) 25 MG tablet Take 1 tablet (25 mg total) by mouth 4 (four) times daily. 09/18/16 09/25/16  Lona Kettle, PA-C  hydrocortisone cream 1 % Apply to affected area 2 times daily PRN rash Patient not taking: Reported on 07/01/2016 06/20/16   Raeford Razor, MD  lisinopril (PRINIVIL,ZESTRIL) 40 MG tablet Take 1 tablet (40 mg total) by mouth daily. 05/25/16   Veryl Speak, FNP  naproxen (NAPROSYN) 500 MG tablet Take 1 tablet (500 mg total) by mouth 2 (two) times daily. Patient not taking: Reported on 07/01/2016 08/16/15   Gilda Crease, MD  potassium chloride (K-DUR) 10 MEQ tablet Take 2 tablets (20 mEq total) by mouth daily. 09/18/16   Lona Kettle, PA-C  predniSONE (STERAPRED UNI-PAK 21 TAB) 10 MG (21) TBPK tablet Take 1 tablet (10 mg total) by mouth daily. Take 6 tabs by mouth daily  for 2 days, then 5 tabs for 2 days, then 4 tabs for 2 days, then 3 tabs for 2 days, 2 tabs for 2 days, then 1 tab by mouth daily for 2 days 06/22/16   Shaune Pollack, MD    Family History Family History  Problem Relation  Age of Onset  . Hypertension Mother   . Heart disease Maternal Grandfather   . Hypertension Maternal Grandfather     Social History Social History  Substance Use Topics  . Smoking status: Never Smoker  . Smokeless tobacco: Never Used  . Alcohol use No     Allergies   Patient has no known allergies.   Review of Systems Review of Systems  Constitutional: Negative for chills, diaphoresis and fever.  HENT: Negative for trouble swallowing.   Eyes: Negative for visual disturbance.  Respiratory: Negative for shortness of breath.   Cardiovascular: Negative for chest pain.  Gastrointestinal: Negative for  abdominal pain, nausea and vomiting.  Genitourinary: Negative for dysuria and hematuria.  Musculoskeletal: Positive for arthralgias and joint swelling.  Skin: Negative for rash.  Neurological: Negative for dizziness, syncope, facial asymmetry, speech difficulty, weakness, light-headedness, numbness and headaches.     Physical Exam Updated Vital Signs BP (!) 186/136   Pulse 115   Temp 97.6 F (36.4 C) (Oral)   Resp 20   Ht 5\' 6"  (1.676 m)   Wt 107.5 kg   SpO2 99%   BMI 38.25 kg/m   Physical Exam  Constitutional: He appears well-developed and well-nourished. No distress.  Morbidly obese  HENT:  Head: Normocephalic and atraumatic.  Mouth/Throat: Oropharynx is clear and moist. No oropharyngeal exudate.  Eyes: Conjunctivae and EOM are normal. Pupils are equal, round, and reactive to light. Right eye exhibits no discharge. Left eye exhibits no discharge. No scleral icterus.  Neck: Normal range of motion and phonation normal. Neck supple. No neck rigidity. Normal range of motion present.  Cardiovascular: Normal rate, regular rhythm, normal heart sounds and intact distal pulses.   No murmur heard. Pulmonary/Chest: Effort normal and breath sounds normal. No stridor. No respiratory distress. He has no wheezes. He has no rales.  Abdominal: Soft. Bowel sounds are normal. He exhibits no distension. There is no tenderness. There is no rigidity, no rebound, no guarding and no CVA tenderness.  Morbidly obese.  Musculoskeletal:       Right ankle: He exhibits swelling. Tenderness. Lateral malleolus tenderness found.  Right ankle: Mild swelling appreciated with TTP at lateral malleolus. No obvious deformity. ROM, strength, sensation intact. 2+ DP pulses.   Lymphadenopathy:    He has no cervical adenopathy.  Neurological: He is alert. He is not disoriented. No sensory deficit. Coordination normal. GCS eye subscore is 4. GCS verbal subscore is 5. GCS motor subscore is 6.  Mental Status:  Alert,  thought content appropriate, able to give a coherent history. Speech fluent without evidence of aphasia. Able to follow 2 step commands without difficulty.  Cranial Nerves:  II:  Peripheral visual fields grossly normal, pupils equal, round, reactive to light III,IV, VI: ptosis not present, extra-ocular motions intact bilaterally  V,VII: smile symmetric, facial light touch sensation equal VIII: hearing grossly normal to voice  X: uvula elevates symmetrically  XI: bilateral shoulder shrug symmetric and strong XII: midline tongue extension without fassiculations Motor:  Normal tone. 5/5 in upper and lower extremities bilaterally including strong and equal grip strength and dorsiflexion/plantar flexion Sensory: light touch normal in all extremities. Cerebellar: normal finger-to-nose with bilateral upper extremities CV: distal pulses palpable throughout   Skin: Skin is warm and dry. He is not diaphoretic.  Psychiatric: He has a normal mood and affect. His behavior is normal.     ED Treatments / Results  Labs (all labs ordered are listed, but only abnormal results are displayed) Labs  Reviewed  I-STAT CHEM 8, ED - Abnormal; Notable for the following:       Result Value   Potassium 2.9 (*)    Chloride 99 (*)    Creatinine, Ser 1.40 (*)    Glucose, Bld 107 (*)    All other components within normal limits    EKG  EKG Interpretation  Date/Time:  Friday September 18 2016 15:12:22 EST Ventricular Rate:  104 PR Interval:  142 QRS Duration: 98 QT Interval:  358 QTC Calculation: 470 R Axis:   70 Text Interpretation:  Sinus tachycardia Possible Left atrial enlargement T wave abnormality, consider inferolateral ischemia Abnormal ECG TW lateral and inferior leads new from prior, repolarization abnormality Confirmed by [facility] MD, ERIN ([id number]) on 09/18/2016 7:13:54 PM       Radiology Dg Ankle Complete Right  Result Date: 09/18/2016 CLINICAL DATA:  Tripped and fell yesterday.  Right  ankle pain. EXAM: RIGHT ANKLE - COMPLETE 3+ VIEW COMPARISON:  08/16/2015 FINDINGS: No fracture.  No bone lesion. The ankle mortise is normally spaced and aligned. No arthropathic change. There is diffuse soft tissue edema. IMPRESSION: No fracture or ankle joint abnormality. Electronically Signed   By: Amie Portland M.D.   On: 09/18/2016 15:38    Procedures Procedures (including critical care time)  Medications Ordered in ED Medications  hydrALAZINE (APRESOLINE) tablet 10 mg (not administered)  acetaminophen (TYLENOL) tablet 650 mg (650 mg Oral Given 09/18/16 1835)  potassium chloride SA (K-DUR,KLOR-CON) CR tablet 40 mEq (40 mEq Oral Given 09/18/16 1925)     Initial Impression / Assessment and Plan / ED Course  I have reviewed the triage vital signs and the nursing notes.  Pertinent labs & imaging results that were available during my care of the patient were reviewed by me and considered in my medical decision making (see chart for details).  Clinical Course as of Sep 19 2135  Fri Sep 18, 2016  1800 No obvious fracture or dislocation.  DG Ankle Complete Right [AM]    Clinical Course User Index [AM] Lona Kettle, PA-C    Patient presents to ED with complaint of right ankle pain s/p injury; patient also noted to be hypertensive and tachycardic. Patient is afebrile and non-toxic appearing in NAD. Initial vital signs remarkable for tachycardia and hypertension. O2 sats awake >94% on RA. On my assessment HR in 90s. Right ankle swelling and TTP at lateral malleolus. Neurovascularly intact. X-ray negative for obvious fracture or dislocation; diffuse soft tissue edema - patient has history of lower extremity edema. Suspect ankle sprain - ankle brace and crutches provided. Discussed conservative therapy with RICE and tylenol for pain relief.  Regarding HTN - patient on three medications (lisinopril, chlorthalidone, and amlodipine), reports compliance. He is currently asx - denies CP, SOB,  and neuro sxs. Heart RRR, Lungs CTABL. No focal neuro deficits. EKG shows sinus tachycardia, left atrial enlargement, and TW abnormality in inferior and lateral leads, possible repolarization abnormality. I-stat chem 8 remarkable for hypokalemia - PO repletion. Creatinine mildly elevated at 1.4, last creatinine was 1.5 years ago and 1.04 - unclear if this is acute vs. Chronic. Discussed results and plan with patient. Will start on hydralazine, first dose given in ED. Stressed importance of follow up with PCP and improved management of HTN. Discussed risks of untreated HTN to include chronic kidney disease, stroke, and MI. Rx PO potassium. Strict return precautions given. Pt voiced understanding and is agreeable.   Final Clinical Impressions(s) / ED Diagnoses   Final  diagnoses:  Essential hypertension  Acute right ankle pain    New Prescriptions New Prescriptions   HYDRALAZINE (APRESOLINE) 10 MG TABLET    Take 1 tablet (10 mg total) by mouth 4 (four) times daily.   HYDRALAZINE (APRESOLINE) 25 MG TABLET    Take 1 tablet (25 mg total) by mouth 4 (four) times daily.   POTASSIUM CHLORIDE (K-DUR) 10 MEQ TABLET    Take 2 tablets (20 mEq total) by mouth daily.     Lona Kettle, [place] 09/18/16 2137    Alvira Monday, MD 09/19/16 [phone]

## 2016-09-18 NOTE — Discharge Instructions (Addendum)
Read the information below.  Your x-ray did not show any acute fracture or dislocation. This may be an ankle sprain. Use ankle brace and crutches for next few days. Ice and elevate for 20 minute increments for the next 2-3 days. Take tylenol 650mg  every 6hrs for pain relief. If your symptoms persist for more than one week please follow up with orthopedics, I have provided the contact information above.  Your kidney function has declined since the last time it was checked. It is very important that you follow up with your primary doctor for re-assessment within the next week. Be sure to drink plenty of fluids.  Your potassium was slightly low. You are being started on oral potassium. Please take as directed.  Your blood pressure is very high. You are being started on hydralazine. Please take 10mg  four times a day for the first 4 days. Following take 25mg  four times a day. Please call your doctor on Monday to be scheduled to be seen for further management of your blood pressure.  Use the prescribed medication as directed.  Please discuss all new medications with your pharmacist.   You may return to the Emergency Department at any time for worsening condition or any new symptoms that concern you. Please return if you develop chest pain, shortness of breath, neurologic symptoms or any other new/concerning symptoms.

## 2016-10-29 ENCOUNTER — Encounter (HOSPITAL_COMMUNITY): Payer: Self-pay | Admitting: Emergency Medicine

## 2016-10-29 ENCOUNTER — Emergency Department (HOSPITAL_COMMUNITY)
Admission: EM | Admit: 2016-10-29 | Discharge: 2016-10-29 | Disposition: A | Discharge status: Home / Self Care | Payer: Self-pay | Attending: Emergency Medicine | Admitting: Emergency Medicine

## 2016-10-29 DIAGNOSIS — I10 Essential (primary) hypertension: Secondary | ICD-10-CM | POA: Insufficient documentation

## 2016-10-29 DIAGNOSIS — K122 Cellulitis and abscess of mouth: Secondary | ICD-10-CM

## 2016-10-29 LAB — RAPID STREP SCREEN (MED CTR MEBANE ONLY): STREPTOCOCCUS, GROUP A SCREEN (DIRECT): NEGATIVE

## 2016-10-29 MED ORDER — PREDNISONE 50 MG PO TABS: 50.0000 mg | ORAL_TABLET | Freq: Every day | ORAL | 0 refills | Status: DC

## 2016-10-29 MED ORDER — AMLODIPINE BESYLATE 5 MG PO TABS
10.0000 mg | ORAL_TABLET | Freq: Once | ORAL | Status: AC
  Administered 2016-10-29: 10 mg via ORAL
  Filled 2016-10-29: qty 2

## 2016-10-29 MED ORDER — PREDNISONE 20 MG PO TABS
60.0000 mg | ORAL_TABLET | Freq: Once | ORAL | Status: AC
  Administered 2016-10-29: 60 mg via ORAL
  Filled 2016-10-29: qty 3

## 2016-10-29 MED ORDER — AMOXICILLIN-POT CLAVULANATE 875-125 MG PO TABS
1.0000 | ORAL_TABLET | Freq: Once | ORAL | Status: AC
  Administered 2016-10-29: 1 via ORAL
  Filled 2016-10-29: qty 1

## 2016-10-29 MED ORDER — LISINOPRIL 40 MG PO TABS
40.0000 mg | ORAL_TABLET | Freq: Every day | ORAL | Status: DC
  Administered 2016-10-29: 40 mg via ORAL
  Filled 2016-10-29: qty 1

## 2016-10-29 MED ORDER — AMOXICILLIN-POT CLAVULANATE 875-125 MG PO TABS: 1.0000 | ORAL_TABLET | Freq: Two times a day (BID) | ORAL | 0 refills | Status: DC

## 2016-10-29 NOTE — Discharge Instructions (Signed)
Please take your antibiotic with food. Take twice a day for the next week Drink plenty of fluids Take Tylenol or Ibuprofen for pain/fever as needed Take your blood pressure medicines when you get home

## 2016-10-29 NOTE — ED Triage Notes (Signed)
Patient reports cough and sore throat since yesterday. Patient has hx of HTN, however, patient did not take blood pressure medications today.

## 2016-10-29 NOTE — ED Notes (Signed)
Tresa Endo, PA request pt receives BP meds and BP recheck prior to discharge.

## 2016-10-29 NOTE — ED Provider Notes (Signed)
[facility]-EMERGENCY DEPT Provider Note   CSN: [id number] Arrival date & time: 10/29/16  1417  By signing my name below, I, Vista Minkobert Ross, attest that this documentation has been prepared under the direction and in the presence of YahooKelly Sally Menard PA-C.  Electronically Signed: Vista Minkobert Ross, ED Scribe. 10/29/16. 6:37 PM.  History   Chief Complaint Chief Complaint  Patient presents with  . Cough  . Sore Throat    HPI HPI Comments: Steven Chase is a 30 y.o. male, with Hx of HTN, who presents to the Emergency Department complaining of persistent cough, sore throat, rhinorrhea and post-nasal drip that started 2 days ago. He also reports associated congestion. 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 He is able to tolerate PO's per normal but with exacerbating pain to his throat when drinking PO's. He has taken Nyquil and Dayquil with no relief of his symptoms. Pt states that his daughter did have a virus one week ago. Pt notes that he did have his flu shot this season. Pt's blood pressure on arrival to the ED was 200/150, states that he forgot to take is blood pressure medication this morning. No abdominal pain, nausea or vomiting. He denies any difficulty breathing or swallowing. No headache, chest pain, fever, chills or body aches.  The history is provided by the patient. No language interpreter was used.    Past Medical History:  Diagnosis Date  . Hypertension     Patient Active Problem List   Diagnosis Date Noted  . Obesity 05/25/2016  . Lower leg edema 12/12/2014  . Essential hypertension 11/05/2014  . OSA (obstructive sleep apnea) 11/05/2014    Past Surgical History:  Procedure Laterality Date  . HERNIA REPAIR         Home Medications    Prior to Admission medications   Medication Sig Start Date End Date Taking? Authorizing Provider  amLODipine (NORVASC) 10 MG tablet Take 1 tablet (10 mg total) by mouth daily. 05/25/16    Veryl SpeakGregory D Calone, FNP  cephALEXin (KEFLEX) 500 MG capsule Take 1 capsule (500 mg total) by mouth 2 (two) times daily. 07/01/16   Barrett HenleNicole Elizabeth Nadeau, PA-C  chlorthalidone (HYGROTON) 25 MG tablet Take 1 tablet (25 mg total) by mouth daily. 05/25/16   Veryl SpeakGregory D Calone, FNP  diphenhydrAMINE (BENADRYL) 25 mg capsule Take 1 capsule (25 mg total) by mouth every 6 (six) hours as needed for itching. Patient not taking: Reported on 07/01/2016 06/20/16   Raeford RazorStephen Kohut, MD  hydrALAZINE (APRESOLINE) 10 MG tablet Take 1 tablet (10 mg total) by mouth 4 (four) times daily. 09/18/16 09/22/16  Lona KettleAshley Laurel Meyer, PA-C  hydrALAZINE (APRESOLINE) 25 MG tablet Take 1 tablet (25 mg total) by mouth 4 (four) times daily. 09/18/16 09/25/16  Lona KettleAshley Laurel Meyer, PA-C  hydrocortisone cream 1 % Apply to affected area 2 times daily PRN rash Patient not taking: Reported on 07/01/2016 06/20/16   Raeford RazorStephen Kohut, MD  lisinopril (PRINIVIL,ZESTRIL) 40 MG tablet Take 1 tablet (40 mg total) by mouth daily. 05/25/16   Veryl SpeakGregory D Calone, FNP  naproxen (NAPROSYN) 500 MG tablet Take 1 tablet (500 mg total) by mouth 2 (two) times daily. Patient not taking: Reported on 07/01/2016 08/16/15   Gilda Creasehristopher J Pollina, MD  potassium chloride (K-DUR) 10 MEQ tablet Take 2 tablets (20 mEq total) by mouth daily. 09/18/16   Lona KettleAshley Laurel Meyer, PA-C  predniSONE (STERAPRED UNI-PAK 21 TAB) 10 MG (21) TBPK tablet Take 1  tablet (10 mg total) by mouth daily. Take 6 tabs by mouth daily  for 2 days, then 5 tabs for 2 days, then 4 tabs for 2 days, then 3 tabs for 2 days, 2 tabs for 2 days, then 1 tab by mouth daily for 2 days 06/22/16   Shaune Pollack, MD    Family History Family History  Problem Relation Age of Onset  . Hypertension Mother   . Heart disease Maternal Grandfather   . Hypertension Maternal Grandfather     Social History Social History  Substance Use Topics  . Smoking status: Never Smoker  . Smokeless tobacco: Never Used  . Alcohol use No       Allergies   Patient has no known allergies.   Review of Systems Review of Systems  Constitutional: Negative for chills and fever.  HENT: Positive for congestion, postnasal drip, rhinorrhea and sore throat. Negative for ear pain, facial swelling and trouble swallowing.   Respiratory: Positive for cough. Negative for choking and shortness of breath.   Cardiovascular: Negative for chest pain and leg swelling.  Gastrointestinal: Negative for diarrhea, nausea and vomiting.  Neurological: Negative for headaches.     Physical Exam Updated Vital Signs BP (!) 180/110 (BP Location: Right Arm) Comment: Patient did not take blood pressure medication today  Pulse 113   Temp 98.5 F (36.9 C) (Oral)   Resp 16   Ht 5\' 6"  (1.676 m)   Wt 220 lb (99.8 kg)   SpO2 92%   BMI 35.51 kg/m   Physical Exam  Constitutional: He is oriented to person, place, and time. He appears well-developed and well-nourished. No distress.  HENT:  Head: Normocephalic and atraumatic.  Right Ear: Hearing, tympanic membrane, external ear and ear canal normal.  Left Ear: Hearing, tympanic membrane, external ear and ear canal normal.  Nose: Rhinorrhea present.  Mouth/Throat: Oropharynx is clear and moist. Mucous membranes are dry. Uvula swelling (with exudate) present. No oropharyngeal exudate, posterior oropharyngeal edema, posterior oropharyngeal erythema or tonsillar abscesses. No tonsillar exudate.  Eyes: Conjunctivae are normal. Pupils are equal, round, and reactive to light. Right eye exhibits no discharge. Left eye exhibits no discharge. No scleral icterus.  Neck: Normal range of motion.  Cardiovascular: Regular rhythm.  Tachycardia present.  Exam reveals no gallop and no friction rub.   No murmur heard. Pulmonary/Chest: Effort normal and breath sounds normal. No respiratory distress. He has no wheezes. He has no rales. He exhibits no tenderness.  Abdominal: Soft. Bowel sounds are normal. He exhibits no  distension. There is no tenderness.  Neurological: He is alert and oriented to person, place, and time.  Skin: Skin is warm and dry.  Psychiatric: He has a normal mood and affect. His behavior is normal.  Nursing note and vitals reviewed.    ED Treatments / Results  DIAGNOSTIC STUDIES: Oxygen Saturation is 92% on RA, adequate by my interpretation.  COORDINATION OF CARE: 6:33 PM-Discussed treatment plan with pt at bedside and pt agreed to plan.   Labs (all labs ordered are listed, but only abnormal results are displayed) Labs Reviewed  RAPID STREP SCREEN (NOT AT [facility])  CULTURE, GROUP A STREP [facility])    EKG  EKG Interpretation None       Radiology No results found.  Procedures Procedures (including critical care time)  Medications Ordered in ED Medications  amoxicillin-clavulanate (AUGMENTIN) 875-125 MG per tablet 1 tablet (1 tablet Oral Given 10/29/16 1855)  predniSONE (DELTASONE) tablet 60 mg (60 mg Oral Given  10/29/16 1856)  amLODipine (NORVASC) tablet 10 mg (10 mg Oral Given 10/29/16 1944)     Initial Impression / Assessment and Plan / ED Course  I have reviewed the triage vital signs and the nursing notes.  Pertinent labs & imaging results that were available during my care of the patient were reviewed by me and considered in my medical decision making (see chart for details).  Clinical Course    30 year old male presents with uvulitis and elevated blood pressure. He is markedly hypertensive with SBP in the 200s. He states he takes 2 BP meds but is unsure of what they are. He no longer takes Hydralyzine. Discussed with Dr. Penne Lash. Will give dose of home meds in ED and recheck BP. Patient signed out to [employer] PA-C who will follow up on BP. Anticipate d/c given BP comes down some.  Additionally patient has uvulitis on exam. No signs of respiratory distress or stridor. He is tachycardic and discussed giving him IVF however patient refused. Rapid strep is negative.  Will d/c on course of antibiotics and steroids with first dose given in ED. Patient is NAD, non-toxic, with stable VS. Patient is informed of clinical course, understands medical decision making process, and agrees with plan. Opportunity for questions provided and all questions answered. Return precautions given.   Final Clinical Impressions(s) / ED Diagnoses   Final diagnoses:  Uvulitis    New Prescriptions Discharge Medication List as of 10/29/2016  7:04 PM    START taking these medications   Details  amoxicillin-clavulanate (AUGMENTIN) 875-125 MG tablet Take 1 tablet by mouth every 12 (twelve) hours., Starting Thu 10/29/2016, Print    predniSONE (DELTASONE) 50 MG tablet Take 1 tablet (50 mg total) by mouth daily., Starting Thu 10/29/2016, Print       I personally performed the services described in this documentation, which was scribed in my presence. The recorded information has been reviewed and is accurate.     Bethel Born, PA-C 10/30/16 1051    Shaune Pollack, MD 10/30/16 1329

## 2016-11-01 LAB — CULTURE, GROUP A STREP (THRC)

## 2016-12-10 ENCOUNTER — Encounter: Payer: Self-pay | Admitting: Family

## 2016-12-10 ENCOUNTER — Ambulatory Visit (INDEPENDENT_AMBULATORY_CARE_PROVIDER_SITE_OTHER): Payer: BLUE CROSS/BLUE SHIELD | Admitting: Family

## 2016-12-10 VITALS — BP 158/110 | HR 111 | Temp 98.2°F | Resp 18 | Ht 66.0 in | Wt 245.0 lb

## 2016-12-10 DIAGNOSIS — Z0000 Encounter for general adult medical examination without abnormal findings: Secondary | ICD-10-CM

## 2016-12-10 DIAGNOSIS — G4733 Obstructive sleep apnea (adult) (pediatric): Secondary | ICD-10-CM

## 2016-12-10 DIAGNOSIS — IMO0001 Reserved for inherently not codable concepts without codable children: Secondary | ICD-10-CM

## 2016-12-10 DIAGNOSIS — I10 Essential (primary) hypertension: Secondary | ICD-10-CM

## 2016-12-10 DIAGNOSIS — Z6839 Body mass index (BMI) 39.0-39.9, adult: Secondary | ICD-10-CM | POA: Diagnosis not present

## 2016-12-10 DIAGNOSIS — Z23 Encounter for immunization: Secondary | ICD-10-CM

## 2016-12-10 DIAGNOSIS — E6609 Other obesity due to excess calories: Secondary | ICD-10-CM

## 2016-12-10 DIAGNOSIS — Z0001 Encounter for general adult medical examination with abnormal findings: Secondary | ICD-10-CM | POA: Insufficient documentation

## 2016-12-10 NOTE — Assessment & Plan Note (Signed)
Blood pressure remains uncontrolled with concern for patient noncompliance with medication regimen based on evaluation of refill schedule. Encouraged patient to take medications as prescribed to avoid and reduce risk for end organ damage in the future. Continue current dosage of chlorthalidone and amlodipine. Encouraged to monitor blood pressure at home and follow low-sodium diet. Denies worst headache of life with with no new symptoms of end organ damage. Continue to monitor.

## 2016-12-10 NOTE — Patient Instructions (Addendum)
Thank you for choosing [facility].  SUMMARY AND INSTRUCTIONS:  Please check into the following medications for weight loss:  Contrave, Belviq, or Saxanda  Recommend MyFitnessPal to track calories and exercise.  Medication:  Please take your medications as prescribed.   Your prescription(s) have been submitted to your pharmacy or been printed and provided for you. Please take as directed and contact our office if you believe you are having problem(s) with the medication(s) or have any questions.  Labs:  Please stop by the lab on the lower level of the building for your blood work. Your results will be released to MyChart (or called to you) after review, usually within 72 hours after test completion. If any changes need to be made, you will be notified at that same time.  1.) The lab is open from 7:30am to 5:30 pm Monday-Friday 2.) No appointment is necessary 3.) Fasting (if needed) is 6-8 hours after food and drink; black coffee and water are okay    Follow up:  If your symptoms worsen or fail to improve, please contact our office for further instruction, or in case of emergency go directly to the emergency room at the closest medical facility.    Health Maintenance, Male A healthy lifestyle and preventative care can promote health and wellness.  Maintain regular health, dental, and eye exams.  Eat a healthy diet. Foods like vegetables, fruits, whole grains, low-fat dairy products, and lean protein foods contain the nutrients you need and are low in calories. Decrease your intake of foods high in solid fats, added sugars, and salt. Get information about a proper diet from your health care provider, if necessary.  Regular physical exercise is one of the most important things you can do for your health. Most adults should get at least 150 minutes of moderate-intensity exercise (any activity that increases your heart rate and causes you to sweat) each week. In addition, most  adults need muscle-strengthening exercises on 2 or more days a week.   Maintain a healthy weight. The body mass index (BMI) is a screening tool to identify possible weight problems. It provides an estimate of body fat based on height and weight. Your health care provider can find your BMI and can help you achieve or maintain a healthy weight. For males 20 years and older:  A BMI below 18.5 is considered underweight.  A BMI of 18.5 to 24.9 is normal.  A BMI of 25 to 29.9 is considered overweight.  A BMI of 30 and above is considered obese.  Maintain normal blood lipids and cholesterol by exercising and minimizing your intake of saturated fat. Eat a balanced diet with plenty of fruits and vegetables. Blood tests for lipids and cholesterol should begin at age 11 and be repeated every 5 years. If your lipid or cholesterol levels are high, you are over age 65, or you are at high risk for heart disease, you may need your cholesterol levels checked more frequently.Ongoing high lipid and cholesterol levels should be treated with medicines if diet and exercise are not working.  If you smoke, find out from your health care provider how to quit. If you do not use tobacco, do not start.  Lung cancer screening is recommended for adults aged 55-80 years who are at high risk for developing lung cancer because of a history of smoking. A yearly low-dose CT scan of the lungs is recommended for people who have at least a 30-pack-year history of smoking and are current smokers  or have quit within the past 15 years. A pack year of smoking is smoking an average of 1 pack of cigarettes a day for 1 year (for example, a 30-pack-year history of smoking could mean smoking 1 pack a day for 30 years or 2 packs a day for 15 years). Yearly screening should continue until the smoker has stopped smoking for at least 15 years. Yearly screening should be stopped for people who develop a health problem that would prevent them from  having lung cancer treatment.  If you choose to drink alcohol, do not have more than 2 drinks per day. One drink is considered to be 12 oz (360 mL) of beer, 5 oz (150 mL) of wine, or 1.5 oz (45 mL) of liquor.  Avoid the use of street drugs. Do not share needles with anyone. Ask for help if you need support or instructions about stopping the use of drugs.  High blood pressure causes heart disease and increases the risk of stroke. High blood pressure is more likely to develop in:  People who have blood pressure in the end of the normal range (100-139/85-89 mm Hg).  People who are overweight or obese.  People who are African American.  If you are 59-13 years of age, have your blood pressure checked every 3-5 years. If you are 62 years of age or older, have your blood pressure checked every year. You should have your blood pressure measured twice-once when you are at a hospital or clinic, and once when you are not at a hospital or clinic. Record the average of the two measurements. To check your blood pressure when you are not at a hospital or clinic, you can use:  An automated blood pressure machine at a pharmacy.  A home blood pressure monitor.  If you are 63-56 years old, ask your health care provider if you should take aspirin to prevent heart disease.  Diabetes screening involves taking a blood sample to check your fasting blood sugar level. This should be done once every 3 years after age 71 if you are at a normal weight and without risk factors for diabetes. Testing should be considered at a younger age or be carried out more frequently if you are overweight and have at least 1 risk factor for diabetes.  Colorectal cancer can be detected and often prevented. Most routine colorectal cancer screening begins at the age of 37 and continues through age 58. However, your health care provider may recommend screening at an earlier age if you have risk factors for colon cancer. On a yearly basis,  your health care provider may provide home test kits to check for hidden blood in the stool. A small camera at the end of a tube may be used to directly examine the colon (sigmoidoscopy or colonoscopy) to detect the earliest forms of colorectal cancer. Talk to your health care provider about this at age 42 when routine screening begins. A direct exam of the colon should be repeated every 5-10 years through age 65, unless early forms of precancerous polyps or small growths are found.  People who are at an increased risk for hepatitis B should be screened for this virus. You are considered at high risk for hepatitis B if:  You were born in a country where hepatitis B occurs often. Talk with your health care provider about which countries are considered high risk.  Your parents were born in a high-risk country and you have not received a shot to protect  against hepatitis B (hepatitis B vaccine).  You have HIV or AIDS.  You use needles to inject street drugs.  You live with, or have sex with, someone who has hepatitis B.  You are a man who has sex with other men (MSM).  You get hemodialysis treatment.  You take certain medicines for conditions like cancer, organ transplantation, and autoimmune conditions.  Hepatitis C blood testing is recommended for all people born from [birth year] through 1966 and any individual with known risk factors for hepatitis C.  Healthy men should no longer receive prostate-specific antigen (PSA) blood tests as part of routine cancer screening. Talk to your health care provider about prostate cancer screening.  Testicular cancer screening is not recommended for adolescents or adult males who have no symptoms. Screening includes self-exam, a health care provider exam, and other screening tests. Consult with your health care provider about any symptoms you have or any concerns you have about testicular cancer.  Practice safe sex. Use condoms and avoid high-risk sexual  practices to reduce the spread of sexually transmitted infections (STIs).  You should be screened for STIs, including gonorrhea and chlamydia if:  You are sexually active and are younger than 24 years.  You are older than 24 years, and your health care provider tells you that you are at risk for this type of infection.  Your sexual activity has changed since you were last screened, and you are at an increased risk for chlamydia or gonorrhea. Ask your health care provider if you are at risk.  If you are at risk of being infected with HIV, it is recommended that you take a prescription medicine daily to prevent HIV infection. This is called pre-exposure prophylaxis (PrEP). You are considered at risk if:  You are a man who has sex with other men (MSM).  You are a heterosexual man who is sexually active with multiple partners.  You take drugs by injection.  You are sexually active with a partner who has HIV.  Talk with your health care provider about whether you are at high risk of being infected with HIV. If you choose to begin PrEP, you should first be tested for HIV. You should then be tested every 3 months for as long as you are taking PrEP.  Use sunscreen. Apply sunscreen liberally and repeatedly throughout the day. You should seek shade when your shadow is shorter than you. Protect yourself by wearing long sleeves, pants, a wide-brimmed hat, and sunglasses year round whenever you are outdoors.  Tell your health care provider of new moles or changes in moles, especially if there is a change in shape or color. Also, tell your health care provider if a mole is larger than the size of a pencil eraser.  A one-time screening for abdominal aortic aneurysm (AAA) and surgical repair of large AAAs by ultrasound is recommended for men aged 65-75 years who are current or former smokers.  Stay current with your vaccines (immunizations). This information is not intended to replace advice given to  you by your health care provider. Make sure you discuss any questions you have with your health care provider. Document Released: 04/16/2008 Document Revised: 11/09/2014 Document Reviewed: 07/23/2015 Elsevier Interactive Patient Education  2017 [employer].   Exercising to [employer] Introduction Exercising can help you to lose weight. In order to lose weight through exercise, you need to do vigorous-intensity exercise. You can tell that you are exercising with vigorous intensity if you are breathing very hard and  fast and cannot hold a conversation while exercising. Moderate-intensity exercise helps to maintain your current weight. You can tell that you are exercising at a moderate level if you have a higher heart rate and faster breathing, but you are still able to hold a conversation. How often should I exercise? Choose an activity that you enjoy and set realistic goals. Your health care provider can help you to make an activity plan that works for you. Exercise regularly as directed by your health care provider. This may include:  Doing resistance training twice each week, such as:  Push-ups.  Sit-ups.  Lifting weights.  Using resistance bands.  Doing a given intensity of exercise for a given amount of time. Choose from these options:  150 minutes of moderate-intensity exercise every week.  75 minutes of vigorous-intensity exercise every week.  A mix of moderate-intensity and vigorous-intensity exercise every week. Children, pregnant women, people who are out of shape, people who are overweight, and older adults may need to consult a health care provider for individual recommendations. If you have any sort of medical condition, be sure to consult your health care provider before starting a new exercise program. What are some activities that can help me to lose weight?  Walking at a rate of at least 4.5 miles an hour.  Jogging or running at a rate of 5 miles per  hour.  Biking at a rate of at least 10 miles per hour.  Lap swimming.  Roller-skating or in-line skating.  Cross-country skiing.  Vigorous competitive sports, such as football, basketball, and soccer.  Jumping rope.  Aerobic dancing. How can I be more active in my day-to-day activities?  Use the stairs instead of the elevator.  Take a walk during your lunch break.  If you drive, park your car farther away from work or school.  If you take public transportation, get off one stop early and walk the rest of the way.  Make all of your phone calls while standing up and walking around.  Get up, stretch, and walk around every 30 minutes throughout the day. What guidelines should I follow while exercising?  Do not exercise so much that you hurt yourself, feel dizzy, or get very short of breath.  Consult your health care provider prior to starting a new exercise program.  Wear comfortable clothes and shoes with good support.  Drink plenty of water while you exercise to prevent dehydration or heat stroke. Body water is lost during exercise and must be replaced.  Work out until you breathe faster and your heart beats faster. This information is not intended to replace advice given to you by your health care provider. Make sure you discuss any questions you have with your health care provider. Document Released: 11/21/2010 Document Revised: 03/26/2016 Document Reviewed: 03/22/2014  2017 Elsevier

## 2016-12-10 NOTE — Progress Notes (Signed)
Subjective:    Patient ID: Steven Chase, male    DOB: 07-11-1986, 31 y.o.   MRN: [id number]  Chief Complaint  Patient presents with  . CPE    not fasting    HPI:  Steven Chase is a 31 y.o. male who presents today for an annual wellness visit.   1) Health Maintenance -   Diet - Averaging 1-2 meals per day consisting of a regular diet; Denies caffeine intake.   Exercise - Walks when it is not cold.   2) Preventative Exams / Immunizations:  Dental -- Due for exam.   Vision -- Due for exam   Health Maintenance  Topic Date Due  . TETANUS/TDAP  09/30/2004  . INFLUENZA VACCINE  Completed  . HIV Screening  Completed     Immunization History  Administered Date(s) Administered  . Influenza,inj,Quad PF,36+ Mos 11/19/2014, 08/21/2015  . Tdap 12/10/2016     No Known Allergies   Outpatient Medications Prior to Visit  Medication Sig Dispense Refill  . amLODipine (NORVASC) 10 MG tablet Take 1 tablet (10 mg total) by mouth daily. 30 tablet 2  . chlorthalidone (HYGROTON) 25 MG tablet Take 1 tablet (25 mg total) by mouth daily. 30 tablet 2  . lisinopril (PRINIVIL,ZESTRIL) 40 MG tablet Take 1 tablet (40 mg total) by mouth daily. 30 tablet 2  . potassium chloride (K-DUR) 10 MEQ tablet Take 2 tablets (20 mEq total) by mouth daily. 14 tablet 0  . amoxicillin-clavulanate (AUGMENTIN) 875-125 MG tablet Take 1 tablet by mouth every 12 (twelve) hours. 14 tablet 0  . cephALEXin (KEFLEX) 500 MG capsule Take 1 capsule (500 mg total) by mouth 2 (two) times daily. 14 capsule 0  . hydrALAZINE (APRESOLINE) 10 MG tablet Take 1 tablet (10 mg total) by mouth 4 (four) times daily. 16 tablet 0  . hydrALAZINE (APRESOLINE) 25 MG tablet Take 1 tablet (25 mg total) by mouth 4 (four) times daily. 28 tablet 0  . predniSONE (DELTASONE) 50 MG tablet Take 1 tablet (50 mg total) by mouth daily. 4 tablet 0  . predniSONE (STERAPRED UNI-PAK 21 TAB) 10 MG (21) TBPK tablet Take 1 tablet (10 mg total) by  mouth daily. Take 6 tabs by mouth daily  for 2 days, then 5 tabs for 2 days, then 4 tabs for 2 days, then 3 tabs for 2 days, 2 tabs for 2 days, then 1 tab by mouth daily for 2 days 42 tablet 0   No facility-administered medications prior to visit.      Past Medical History:  Diagnosis Date  . Hypertension   . Obesity      Past Surgical History:  Procedure Laterality Date  . HERNIA REPAIR       Family History  Problem Relation Age of Onset  . Hypertension Mother   . Heart disease Maternal Grandfather   . Hypertension Maternal Grandfather      Social History   Social History  . Marital status: Single    Spouse name: N/A  . Number of children: 1  . Years of education: 12   Occupational History  . [employer]    Social History Main Topics  . Smoking status: Never Smoker  . Smokeless tobacco: Never Used  . Alcohol use No  . Drug use: No  . Sexual activity: Not on file   Other Topics Concern  . Not on file   Social History Narrative   Born and raised in [place], [place]. Currently live in  a private residence with his mother. Fun: Play with his daughter.   Denies religious beliefs that would effect health care.     Review of Systems  Constitutional: Denies fever, chills, fatigue, or significant weight gain/loss. HENT: Head: Denies headache or neck pain Ears: Denies changes in hearing, ringing in ears, earache, drainage Nose: Denies discharge, stuffiness, itching, nosebleed, sinus pain Throat: Denies sore throat, hoarseness, dry mouth, sores, thrush Eyes: Denies loss/changes in vision, pain, redness, blurry/double vision, flashing lights Cardiovascular: Denies chest pain/discomfort, tightness, palpitations, shortness of breath with activity, difficulty lying down, swelling, sudden awakening with shortness of breath Respiratory: Denies shortness of breath, cough, sputum production, wheezing Gastrointestinal: Denies dysphasia, heartburn, change in appetite, nausea,  change in bowel habits, rectal bleeding, constipation, diarrhea, yellow skin or eyes Genitourinary: Denies frequency, urgency, burning/pain, blood in urine, incontinence, change in urinary strength. Musculoskeletal: Denies muscle/joint pain, stiffness, back pain, redness or swelling of joints, trauma Skin: Denies rashes, lumps, itching, dryness, color changes, or hair/nail changes Neurological: Denies dizziness, fainting, seizures, weakness, numbness, tingling, tremor Psychiatric - Denies nervousness, stress, depression or memory loss Endocrine: Denies heat or cold intolerance, sweating, frequent urination, excessive thirst, changes in appetite Hematologic: Denies ease of bruising or bleeding     Objective:     BP (!) 158/110 (BP Location: Left Arm, Patient Position: Sitting, Cuff Size: Large)   Pulse (!) 111   Temp 98.2 F (36.8 C) (Oral)   Resp 18   Ht 5\' 6"  (1.676 m)   Wt 245 lb (111.1 kg)   SpO2 95%   BMI 39.54 kg/m  Nursing note and vital signs reviewed.  Wt Readings from Last 3 Encounters:  12/10/16 245 lb (111.1 kg)  10/29/16 220 lb (99.8 kg)  09/18/16 237 lb (107.5 kg)    Physical Exam  Constitutional: He is oriented to person, place, and time. He appears well-developed and well-nourished.  HENT:  Head: Normocephalic.  Right Ear: Hearing, tympanic membrane, external ear and ear canal normal.  Left Ear: Hearing, tympanic membrane, external ear and ear canal normal.  Nose: Nose normal.  Mouth/Throat: Uvula is midline, oropharynx is clear and moist and mucous membranes are normal.  Eyes: Conjunctivae and EOM are normal. Pupils are equal, round, and reactive to light.  Neck: Neck supple. No JVD present. No tracheal deviation present. No thyromegaly present.  Cardiovascular: Normal rate, regular rhythm, normal heart sounds and intact distal pulses.   Pulmonary/Chest: Effort normal and breath sounds normal.  Abdominal: Soft. Bowel sounds are normal. He exhibits no  distension and no mass. There is no tenderness. There is no rebound and no guarding.  Musculoskeletal: Normal range of motion. He exhibits no edema or tenderness.  Lymphadenopathy:    He has no cervical adenopathy.  Neurological: He is alert and oriented to person, place, and time. He has normal reflexes. No cranial nerve deficit. He exhibits normal muscle tone. Coordination normal.  Skin: Skin is warm and dry.  Psychiatric: He has a normal mood and affect. His behavior is normal. Judgment and thought content normal.       Assessment & Plan:   Problem List Items Addressed This Visit      Cardiovascular and Mediastinum   Essential hypertension    Blood pressure remains uncontrolled with concern for patient noncompliance with medication regimen based on evaluation of refill schedule. Encouraged patient to take medications as prescribed to avoid and reduce risk for end organ damage in the future. Continue current dosage of chlorthalidone and amlodipine. Encouraged  to monitor blood pressure at home and follow low-sodium diet. Denies worst headache of life with with no new symptoms of end organ damage. Continue to monitor.        Respiratory   OSA (obstructive sleep apnea)    Patient appears very tired/fatigued with question of compliance with CPAP. Encourage CPAP compliance to reduce risk for end organ damage in the future. Recommend follow-up with pulmonology/sleep medicine. Continue to monitor.        Other   Obesity    BMI of 39. Recommend weight loss of 5-10% of current body weight. Recommend increasing physical activity to 30 minutes of moderate level activity daily. Encourage nutritional intake that focuses on nutrient dense foods and is moderate, varied, and balanced and is low in saturated fats and processed/sugary foods. Continue to monitor.        Routine adult health maintenance - Primary    1) Anticipatory Guidance: Discussed importance of wearing a seatbelt while driving  and not texting while driving; changing batteries in smoke detector at least once annually; wearing suntan lotion when outside; eating a balanced and moderate diet; getting physical activity at least 30 minutes per day.  2) Immunizations / Screenings / Labs:  Tetanus updated today. All other immunizations are up-to-date per recommendations. Due for a dental and vision screen encouraged to be completed independently. Obtain hemoglobin A1c for diabetes screening. All other screenings are up-to-date per recommendations. Obtain CBC, CMET, TSH, and lipid profile.    Overall fair exam with risk factors for cardiovascular disease including obesity, sleep apnea, and uncontrolled hypertension. Recommend weight loss of 5-10% of current body weight through nutrition and physical activity. Discussed importance of eating a nutritional intake that is moderate, varied, and balanced. Avoid high sodium products given uncontrolled high blood pressure. Continue other healthy lifestyle behaviors and choices. Follow-up prevention exam in 1 year. Follow-up office visit for chronic conditions in one month.       Relevant Orders   CBC   Comprehensive metabolic panel   Lipid panel   TSH   Hemoglobin A1c    Other Visit Diagnoses    Need for Tdap vaccination       Relevant Orders   Tdap vaccine greater than or equal to 7yo IM (Completed)       I have discontinued Mr. Muns lisinopril, predniSONE, cephALEXin, potassium chloride, hydrALAZINE, hydrALAZINE, amoxicillin-clavulanate, and predniSONE. I am also having him maintain his amLODipine and chlorthalidone.   Follow-up: Return in about 1 month (around 01/07/2017), or if symptoms worsen or fail to improve.   Jeanine Luz, FNP

## 2016-12-10 NOTE — Assessment & Plan Note (Signed)
1) Anticipatory Guidance: Discussed importance of wearing a seatbelt while driving and not texting while driving; changing batteries in smoke detector at least once annually; wearing suntan lotion when outside; eating a balanced and moderate diet; getting physical activity at least 30 minutes per day.  2) Immunizations / Screenings / Labs:  Tetanus updated today. All other immunizations are up-to-date per recommendations. Due for a dental and vision screen encouraged to be completed independently. Obtain hemoglobin A1c for diabetes screening. All other screenings are up-to-date per recommendations. Obtain CBC, CMET, TSH, and lipid profile.    Overall fair exam with risk factors for cardiovascular disease including obesity, sleep apnea, and uncontrolled hypertension. Recommend weight loss of 5-10% of current body weight through nutrition and physical activity. Discussed importance of eating a nutritional intake that is moderate, varied, and balanced. Avoid high sodium products given uncontrolled high blood pressure. Continue other healthy lifestyle behaviors and choices. Follow-up prevention exam in 1 year. Follow-up office visit for chronic conditions in one month.

## 2016-12-10 NOTE — Assessment & Plan Note (Signed)
Patient appears very tired/fatigued with question of compliance with CPAP. Encourage CPAP compliance to reduce risk for end organ damage in the future. Recommend follow-up with pulmonology/sleep medicine. Continue to monitor.

## 2016-12-10 NOTE — Assessment & Plan Note (Signed)
BMI of 39. Recommend weight loss of 5-10% of current body weight. Recommend increasing physical activity to 30 minutes of moderate level activity daily. Encourage nutritional intake that focuses on nutrient dense foods and is moderate, varied, and balanced and is low in saturated fats and processed/sugary foods. Continue to monitor.   

## 2016-12-14 ENCOUNTER — Telehealth: Payer: Self-pay | Admitting: Family

## 2016-12-14 NOTE — Telephone Encounter (Signed)
Which surgery is he referring to?

## 2016-12-14 NOTE — Telephone Encounter (Signed)
Patient called in wanting to know when he could have the surgery done, that you all talked about at his last visit. He does not remember what it is called. Please follow up with patient. Thank you.

## 2016-12-15 NOTE — Telephone Encounter (Signed)
If it is for weight management we can refer him to bariatric medicine.

## 2016-12-15 NOTE — Telephone Encounter (Signed)
Called patient. I informed him. He said Oh OK. Well whatever he can do next, he wants to try.

## 2016-12-15 NOTE — Telephone Encounter (Signed)
He did not know. ? He said he believes its liposuction? He states you talked about doing a surgery at his last visit.

## 2016-12-16 NOTE — Telephone Encounter (Signed)
In order to consider bariatric surgery he would need to try multiple diets and exercise programs with no success to improve weight.

## 2017-01-07 ENCOUNTER — Ambulatory Visit: Payer: BLUE CROSS/BLUE SHIELD | Admitting: Family

## 2017-01-12 ENCOUNTER — Ambulatory Visit: Payer: BLUE CROSS/BLUE SHIELD | Admitting: Family

## 2017-01-14 ENCOUNTER — Other Ambulatory Visit (INDEPENDENT_AMBULATORY_CARE_PROVIDER_SITE_OTHER): Payer: BLUE CROSS/BLUE SHIELD

## 2017-01-14 ENCOUNTER — Encounter: Payer: Self-pay | Admitting: Family

## 2017-01-14 ENCOUNTER — Ambulatory Visit (INDEPENDENT_AMBULATORY_CARE_PROVIDER_SITE_OTHER): Payer: BLUE CROSS/BLUE SHIELD | Admitting: Family

## 2017-01-14 VITALS — BP 190/120 | HR 108 | Temp 98.2°F | Resp 22 | Ht 66.0 in | Wt 251.0 lb

## 2017-01-14 DIAGNOSIS — I10 Essential (primary) hypertension: Secondary | ICD-10-CM | POA: Diagnosis not present

## 2017-01-14 LAB — COMPREHENSIVE METABOLIC PANEL
ALBUMIN: 4.3 g/dL (ref 3.5–5.2)
ALK PHOS: 50 U/L (ref 39–117)
ALT: 42 U/L (ref 0–53)
AST: 33 U/L (ref 0–37)
BUN: 15 mg/dL (ref 6–23)
CHLORIDE: 98 meq/L (ref 96–112)
CO2: 34 mEq/L — ABNORMAL HIGH (ref 19–32)
Calcium: 10 mg/dL (ref 8.4–10.5)
Creatinine, Ser: 1.24 mg/dL (ref 0.40–1.50)
GFR: 87.28 mL/min (ref >60.00)
GLUCOSE: 113 mg/dL — AB (ref 70–99)
POTASSIUM: 3 meq/L — AB (ref 3.5–5.1)
SODIUM: 140 meq/L (ref 135–145)
TOTAL PROTEIN: 7.6 g/dL (ref 6.0–8.3)
Total Bilirubin: 0.6 mg/dL (ref 0.2–1.2)

## 2017-01-14 LAB — HEMOGLOBIN A1C: HEMOGLOBIN A1C: 6 % (ref 4.6–6.5)

## 2017-01-14 MED ORDER — NEBIVOLOL HCL 10 MG PO TABS: 10.0000 mg | ORAL_TABLET | Freq: Every day | ORAL | 0 refills | Status: DC

## 2017-01-14 NOTE — Progress Notes (Signed)
Subjective:    Patient ID: Steven Chase, male    DOB: 1986/09/22, 31 y.o.   MRN: [id number]  Chief Complaint  Patient presents with  . Follow-up    HPI:  Steven Chase is a 31 y.o. male who  has a past medical history of Hypertension and Obesity. and presents today for a follow up office visit.  Hypertension - Currently maintained on chlorthalidone and amlodipine. Reports taking the medication as prescribed and denies adverse side effects. Denies worst headache of life with no new symptoms of end organ damage. Blood pressure at home through a wrist cuff remains above goal. Working on following a low sodium diet. Physical activity is walking the dogs around the block.   BP Readings from Last 3 Encounters:  01/14/17 (!) 190/120  12/10/16 (!) 158/110  10/29/16 (!) 177/142    No Known Allergies    Outpatient Medications Prior to Visit  Medication Sig Dispense Refill  . amLODipine (NORVASC) 10 MG tablet Take 1 tablet (10 mg total) by mouth daily. 30 tablet 2  . chlorthalidone (HYGROTON) 25 MG tablet Take 1 tablet (25 mg total) by mouth daily. 30 tablet 2   No facility-administered medications prior to visit.     Review of Systems  Constitutional: Negative for chills and fever.  Eyes:       Negative for changes in vision  Respiratory: Negative for cough, chest tightness and wheezing.   Cardiovascular: Negative for chest pain, palpitations and leg swelling.  Neurological: Negative for dizziness, weakness and light-headedness.      Objective:    BP (!) 190/120 (BP Location: Left Arm, Patient Position: Sitting, Cuff Size: Large)   Pulse (!) 108   Temp 98.2 F (36.8 C) (Oral)   Resp (!) 22   Ht '5\' 6"'  (1.676 m)   Wt 251 lb (113.9 kg)   SpO2 98%   BMI 40.51 kg/m  Nursing note and vital signs reviewed.  Physical Exam  Constitutional: He is oriented to person, place, and time. He appears well-developed and well-nourished. No distress.  Cardiovascular: Normal rate,  regular rhythm, normal heart sounds and intact distal pulses.   Pulmonary/Chest: Effort normal and breath sounds normal.  Neurological: He is alert and oriented to person, place, and time.  Skin: Skin is warm and dry.  Psychiatric: He has a normal mood and affect. His behavior is normal. Judgment and thought content normal.       Assessment & Plan:   Problem List Items Addressed This Visit      Cardiovascular and Mediastinum   Essential hypertension - Primary    Blood pressure significant elevated above goal 140/90 with current regimen and no adverse side effects. Continue to question patient compliance with elevated blood pressures. Denies worst headache of life with no new symptoms of end organ damage noted on physical exam. Continue current dosage of chlorthalidone amlodipine. Start nebivolol. Encouraged to monitor blood pressure at home and follow low-sodium diet. Information on Dash eating plan provided and after visit summary. Encouraged aggressive weight loss to help with his chronic conditions and blood pressure. Follow-up in 2 weeks for blood pressure check or if headaches or blurred vision develop.      Relevant Medications   nebivolol (BYSTOLIC) 10 MG tablet   Other Relevant Orders   Comp Met (CMET)   Hemoglobin A1c       I am having Mr. Maffeo start on nebivolol. I am also having him maintain his amLODipine and chlorthalidone.  Meds ordered this encounter  Medications  . nebivolol (BYSTOLIC) 10 MG tablet    Sig: Take 1 tablet (10 mg total) by mouth daily.    Dispense:  90 tablet    Refill:  0    Order Specific Question:   Supervising Provider    Answer:   Pricilla Holm A [[id number]]     Follow-up: Return in about 2 weeks (around 01/28/2017), or if symptoms worsen or fail to improve, for Nursing visit for blood pressure.  Mauricio Po, FNP

## 2017-01-14 NOTE — Assessment & Plan Note (Signed)
Blood pressure significant elevated above goal 140/90 with current regimen and no adverse side effects. Continue to question patient compliance with elevated blood pressures. Denies worst headache of life with no new symptoms of end organ damage noted on physical exam. Continue current dosage of chlorthalidone amlodipine. Start nebivolol. Encouraged to monitor blood pressure at home and follow low-sodium diet. Information on Dash eating plan provided and after visit summary. Encouraged aggressive weight loss to help with his chronic conditions and blood pressure. Follow-up in 2 weeks for blood pressure check or if headaches or blurred vision develop.

## 2017-01-14 NOTE — Patient Instructions (Signed)
Thank you for choosing [facility].  SUMMARY AND INSTRUCTIONS:  Please continue to monitor your blood pressure at home.  Recommend a low sodium diet of less that 2000 mg per day.  Increase physical activity to goal of 30 minutes of moderate level activity daily.  Follow up in 2 weeks for blood pressure check through nurse visit.  Medication:  Your prescription(s) have been submitted to your pharmacy or been printed and provided for you. Please take as directed and contact our office if you believe you are having problem(s) with the medication(s) or have any questions.  Labs:  Please stop by the lab on the lower level of the building for your blood work. Your results will be released to MyChart (or called to you) after review, usually within 72 hours after test completion. If any changes need to be made, you will be notified at that same time.  1.) The lab is open from 7:30am to 5:30 pm Monday-Friday 2.) No appointment is necessary 3.) Fasting (if needed) is 6-8 hours after food and drink; black coffee and water are okay   Follow up:  If your symptoms worsen or fail to improve, please contact our office for further instruction, or in case of emergency go directly to the emergency room at the closest medical facility.    DASH Eating Plan DASH stands for "Dietary Approaches to Stop Hypertension." The DASH eating plan is a healthy eating plan that has been shown to reduce high blood pressure (hypertension). It may also reduce your risk for type 2 diabetes, heart disease, and stroke. The DASH eating plan may also help with weight loss. What are tips for following this plan? General guidelines   Avoid eating more than 2,300 mg (milligrams) of salt (sodium) a day. If you have hypertension, you may need to reduce your sodium intake to 1,500 mg a day.  Limit alcohol intake to no more than 1 drink a day for nonpregnant women and 2 drinks a day for men. One drink equals 12 oz of beer,  5 oz of wine, or 1 oz of hard liquor.  Work with your health care provider to maintain a healthy body weight or to lose weight. Ask what an ideal weight is for you.  Get at least 30 minutes of exercise that causes your heart to beat faster (aerobic exercise) most days of the week. Activities may include walking, swimming, or biking.  Work with your health care provider or diet and nutrition specialist (dietitian) to adjust your eating plan to your individual calorie needs. Reading food labels   Check food labels for the amount of sodium per serving. Choose foods with less than 5 percent of the Daily Value of sodium. Generally, foods with less than 300 mg of sodium per serving fit into this eating plan.  To find whole grains, look for the word "whole" as the first word in the ingredient list. Shopping   Buy products labeled as "low-sodium" or "no salt added."  Buy fresh foods. Avoid canned foods and premade or frozen meals. Cooking   Avoid adding salt when cooking. Use salt-free seasonings or herbs instead of table salt or sea salt. Check with your health care provider or pharmacist before using salt substitutes.  Do not fry foods. Cook foods using healthy methods such as baking, boiling, grilling, and broiling instead.  Cook with heart-healthy oils, such as olive, canola, soybean, or sunflower oil. Meal planning    Eat a balanced diet that includes:  5  or more servings of fruits and vegetables each day. At each meal, try to fill half of your plate with fruits and vegetables.  Up to 6-8 servings of whole grains each day.  Less than 6 oz of lean meat, poultry, or fish each day. A 3-oz serving of meat is about the same size as a deck of cards. One egg equals 1 oz.  2 servings of low-fat dairy each day.  A serving of nuts, seeds, or beans 5 times each week.  Heart-healthy fats. Healthy fats called Omega-3 fatty acids are found in foods such as flaxseeds and coldwater fish, like  sardines, salmon, and mackerel.  Limit how much you eat of the following:  Canned or prepackaged foods.  Food that is high in trans fat, such as fried foods.  Food that is high in saturated fat, such as fatty meat.  Sweets, desserts, sugary drinks, and other foods with added sugar.  Full-fat dairy products.  Do not salt foods before eating.  Try to eat at least 2 vegetarian meals each week.  Eat more home-cooked food and less restaurant, buffet, and fast food.  When eating at a restaurant, ask that your food be prepared with less salt or no salt, if possible. What foods are recommended? The items listed may not be a complete list. Talk with your dietitian about what dietary choices are best for you. Grains  Whole-grain or whole-wheat bread. Whole-grain or whole-wheat pasta. Brown rice. Orpah Cobb. Bulgur. Whole-grain and low-sodium cereals. Pita bread. Low-fat, low-sodium crackers. Whole-wheat flour tortillas. Vegetables  Fresh or frozen vegetables (raw, steamed, roasted, or grilled). Low-sodium or reduced-sodium tomato and vegetable juice. Low-sodium or reduced-sodium tomato sauce and tomato paste. Low-sodium or reduced-sodium canned vegetables. Fruits  All fresh, dried, or frozen fruit. Canned fruit in natural juice (without added sugar). Meat and other protein foods  Skinless chicken or [place]. Ground chicken or [place]. Pork with fat trimmed off. Fish and seafood. Egg whites. Dried beans, peas, or lentils. Unsalted nuts, nut butters, and seeds. Unsalted canned beans. Lean cuts of beef with fat trimmed off. Low-sodium, lean deli meat. Dairy  Low-fat (1%) or fat-free (skim) milk. Fat-free, low-fat, or reduced-fat cheeses. Nonfat, low-sodium ricotta or cottage cheese. Low-fat or nonfat yogurt. Low-fat, low-sodium cheese. Fats and oils  Soft margarine without trans fats. Vegetable oil. Low-fat, reduced-fat, or light mayonnaise and salad dressings (reduced-sodium). Canola,  safflower, olive, soybean, and sunflower oils. Avocado. Seasoning and other foods  Herbs. Spices. Seasoning mixes without salt. Unsalted popcorn and pretzels. Fat-free sweets. What foods are not recommended? The items listed may not be a complete list. Talk with your dietitian about what dietary choices are best for you. Grains  Baked goods made with fat, such as croissants, muffins, or some breads. Dry pasta or rice meal packs. Vegetables  Creamed or fried vegetables. Vegetables in a cheese sauce. Regular canned vegetables (not low-sodium or reduced-sodium). Regular canned tomato sauce and paste (not low-sodium or reduced-sodium). Regular tomato and vegetable juice (not low-sodium or reduced-sodium). Rosita Fire. Olives. Fruits  Canned fruit in a light or heavy syrup. Fried fruit. Fruit in cream or butter sauce. Meat and other protein foods  Fatty cuts of meat. Ribs. Fried meat. Tomasa Blase. Sausage. Bologna and other processed lunch meats. Salami. Fatback. Hotdogs. Bratwurst. Salted nuts and seeds. Canned beans with added salt. Canned or smoked fish. Whole eggs or egg yolks. Chicken or [place] with skin. Dairy  Whole or 2% milk, cream, and half-and-half. Whole or full-fat cream cheese.  Whole-fat or sweetened yogurt. Full-fat cheese. Nondairy creamers. Whipped toppings. Processed cheese and cheese spreads. Fats and oils  Butter. Stick margarine. Lard. Shortening. Ghee. Bacon fat. Tropical oils, such as coconut, palm kernel, or palm oil. Seasoning and other foods  Salted popcorn and pretzels. Onion salt, garlic salt, seasoned salt, table salt, and sea salt. Worcestershire sauce. Tartar sauce. Barbecue sauce. Teriyaki sauce. Soy sauce, including reduced-sodium. Steak sauce. Canned and packaged gravies. Fish sauce. Oyster sauce. Cocktail sauce. Horseradish that you find on the shelf. Ketchup. Mustard. Meat flavorings and tenderizers. Bouillon cubes. Hot sauce and Tabasco sauce. Premade or packaged marinades.  Premade or packaged taco seasonings. Relishes. Regular salad dressings. Where to find more information:  National Heart, Lung, and Blood Institute: PopSteam.is  American Heart Association: www.heart.org Summary  The DASH eating plan is a healthy eating plan that has been shown to reduce high blood pressure (hypertension). It may also reduce your risk for type 2 diabetes, heart disease, and stroke.  With the DASH eating plan, you should limit salt (sodium) intake to 2,300 mg a day. If you have hypertension, you may need to reduce your sodium intake to 1,500 mg a day.  When on the DASH eating plan, aim to eat more fresh fruits and vegetables, whole grains, lean proteins, low-fat dairy, and heart-healthy fats.  Work with your health care provider or diet and nutrition specialist (dietitian) to adjust your eating plan to your individual calorie needs. This information is not intended to replace advice given to you by your health care provider. Make sure you discuss any questions you have with your health care provider. Document Released: 10/08/2011 Document Revised: 10/12/2016 Document Reviewed: 10/12/2016 Elsevier Interactive Patient Education  2017 [employer].

## 2017-01-18 ENCOUNTER — Other Ambulatory Visit: Payer: Self-pay | Admitting: *Deleted

## 2017-01-18 MED ORDER — NEBIVOLOL HCL 10 MG PO TABS: 10.0000 mg | ORAL_TABLET | Freq: Every day | ORAL | 1 refills | Status: DC

## 2017-01-25 ENCOUNTER — Telehealth: Payer: Self-pay | Admitting: Family

## 2017-01-25 NOTE — Telephone Encounter (Signed)
Pt called in and has questions about the medications that are at the pharmacy

## 2017-01-26 MED ORDER — METOPROLOL SUCCINATE ER 50 MG PO TB24: 50.0000 mg | ORAL_TABLET | Freq: Every day | ORAL | 3 refills | Status: DC

## 2017-01-26 NOTE — Telephone Encounter (Signed)
Pt mom called regarding nebivolol (BYSTOLIC), there is a confusion at the pharmacy and would like a few samples until they can get that cleared up.

## 2017-01-26 NOTE — Telephone Encounter (Signed)
Metoprolol sent to pharmacy in place of bystolic. Pt is aware.

## 2017-02-12 ENCOUNTER — Telehealth: Payer: Self-pay | Admitting: Family

## 2017-02-12 DIAGNOSIS — I10 Essential (primary) hypertension: Secondary | ICD-10-CM

## 2017-02-12 MED ORDER — CHLORTHALIDONE 25 MG PO TABS: 25.0000 mg | ORAL_TABLET | Freq: Every day | ORAL | 2 refills | Status: DC

## 2017-02-12 MED ORDER — AMLODIPINE BESYLATE 10 MG PO TABS: 10.0000 mg | ORAL_TABLET | Freq: Every day | ORAL | 2 refills | Status: DC

## 2017-02-12 NOTE — Telephone Encounter (Signed)
Needs all 3 meds on his med list refilled.  Pharmacy on file

## 2017-02-12 NOTE — Telephone Encounter (Signed)
Medications have been sent

## 2017-04-29 ENCOUNTER — Ambulatory Visit (HOSPITAL_COMMUNITY)
Admission: EM | Admit: 2017-04-29 | Discharge: 2017-04-29 | Disposition: A | Discharge status: Home / Self Care | Payer: BLUE CROSS/BLUE SHIELD | Attending: Internal Medicine | Admitting: Internal Medicine

## 2017-04-29 ENCOUNTER — Encounter (HOSPITAL_COMMUNITY): Payer: Self-pay | Admitting: Emergency Medicine

## 2017-04-29 ENCOUNTER — Ambulatory Visit (INDEPENDENT_AMBULATORY_CARE_PROVIDER_SITE_OTHER): Payer: BLUE CROSS/BLUE SHIELD

## 2017-04-29 DIAGNOSIS — M25571 Pain in right ankle and joints of right foot: Secondary | ICD-10-CM

## 2017-04-29 DIAGNOSIS — M7989 Other specified soft tissue disorders: Secondary | ICD-10-CM | POA: Diagnosis not present

## 2017-04-29 MED ORDER — NAPROXEN 500 MG PO TABS: 500.0000 mg | ORAL_TABLET | Freq: Two times a day (BID) | ORAL | 0 refills | Status: AC

## 2017-04-29 NOTE — Discharge Instructions (Signed)
Start naproxen 500mg  twice a day for 10 days for pain and inflammation. Ice compresses for 20 mins 3-4 times a day. Do not put ice directly on the skin. Continue to wear your ankle brace for activity. Elevate leg when you can to help decrease the swelling.

## 2017-04-29 NOTE — ED Triage Notes (Signed)
Yesterday his dog jumped on his ankle and now it is painful.  pain in right ankle, lateral.  Area is swollen.patient is able to move his toes

## 2017-04-29 NOTE — ED Provider Notes (Signed)
CSN: [id number]     Arrival date & time 04/29/17  1616 History   None    Chief Complaint  Patient presents with  . Ankle Pain   (Consider location/radiation/quality/duration/timing/severity/associated sxs/prior Treatment) 31 yo male who comes in with 1 day history of right ankle pain after being jumped on by his pitbull. States has been having swelling on the lateral side of his foot. Pain with movement and pressure. Denies numbness/tingling, redness, increased warmth. Has tried icing without relief.      Past Medical History:  Diagnosis Date  . Hypertension   . Obesity    Past Surgical History:  Procedure Laterality Date  . HERNIA REPAIR     Family History  Problem Relation Age of Onset  . Hypertension Mother   . Heart disease Maternal Grandfather   . Hypertension Maternal Grandfather    Social History  Substance Use Topics  . Smoking status: Never Smoker  . Smokeless tobacco: Never Used  . Alcohol use No    Review of Systems  Constitutional: Negative for chills, fatigue and fever.  Musculoskeletal: Positive for arthralgias, gait problem, joint swelling and myalgias.  Skin: Negative for wound.  Neurological: Negative for numbness.    Allergies  Patient has no known allergies.  Home Medications   Prior to Admission medications   Medication Sig Start Date End Date Taking? Authorizing Provider  amLODipine (NORVASC) 10 MG tablet Take 1 tablet (10 mg total) by mouth daily. 02/12/17   Veryl Speak, FNP  chlorthalidone (HYGROTON) 25 MG tablet Take 1 tablet (25 mg total) by mouth daily. 02/12/17   Veryl Speak, FNP  metoprolol succinate (TOPROL-XL) 50 MG 24 hr tablet Take 1 tablet (50 mg total) by mouth daily. Take with or immediately following a meal. 01/26/17   Veryl Speak, FNP  naproxen (NAPROSYN) 500 MG tablet Take 1 tablet (500 mg total) by mouth 2 (two) times daily. 04/29/17 05/09/17  Belinda Fisher, PA-C   Meds Ordered and Administered this Visit   Medications - No data to display  BP 100/69 (BP Location: Right Arm) Comment (BP Location): large cuff  Pulse (!) 102   Temp 98.6 F (37 C) (Oral)   Resp (!) 22   SpO2 97%  No data found.   Physical Exam  Constitutional: He is oriented to person, place, and time. He appears well-developed and well-nourished. No distress.  HENT:  Head: Normocephalic and atraumatic.  Eyes: Conjunctivae are normal. Pupils are equal, round, and reactive to light.  Musculoskeletal:       Right knee: Normal.       Left knee: Normal.       Left ankle: Normal.  Right ankle: lateral swelling of the ankle. Tender to palpation on the lateral side, no tenderness of the medial side. No bony tenderness of the lateral/medal malleolus or the phalanges. Decrease ROM due to swelling and pain. Decrease strength due to pain. Sensation intact and equal. Pedal pulses 2+ and equal.   Neurological: He is alert and oriented to person, place, and time.  Skin: Skin is warm and dry.  Psychiatric: He has a normal mood and affect. His behavior is normal. Judgment normal.    Urgent Care Course     Procedures (including critical care time)  Labs Review Labs Reviewed - No data to display  Imaging Review Dg Ankle Complete Right  Result Date: 04/29/2017 CLINICAL DATA:  Dog jumped on patient's ankle with pain, initial encounter EXAM: RIGHT ANKLE - COMPLETE  3+ VIEW COMPARISON:  09/18/2016 FINDINGS: Soft tissue swelling is noted predominately laterally. No acute fracture or dislocation is noted. IMPRESSION: Soft tissue swelling without acute bony abnormality. Electronically Signed   By: Alcide Clever M.D.   On: 04/29/2017 17:00         MDM   1. Acute right ankle pain    1. Reviewed imaging with patient, no evidence of fracture or dislocation. Start Naproxen 500mg  BID x 10 days. Ice compress and elevation. Continue to wear ankle brace for activity.    Belinda Fisher, PA-C 04/29/17 1715

## 2017-08-24 ENCOUNTER — Ambulatory Visit: Payer: BLUE CROSS/BLUE SHIELD | Admitting: Pulmonary Disease

## 2017-09-02 ENCOUNTER — Ambulatory Visit: Payer: Self-pay | Admitting: Pulmonary Disease

## 2017-09-12 ENCOUNTER — Ambulatory Visit (HOSPITAL_COMMUNITY)
Admission: EM | Admit: 2017-09-12 | Discharge: 2017-09-12 | Disposition: A | Discharge status: Home / Self Care | Payer: Self-pay | Attending: Family Medicine | Admitting: Family Medicine

## 2017-09-12 ENCOUNTER — Ambulatory Visit (INDEPENDENT_AMBULATORY_CARE_PROVIDER_SITE_OTHER): Payer: BLUE CROSS/BLUE SHIELD

## 2017-09-12 ENCOUNTER — Encounter (HOSPITAL_COMMUNITY): Payer: Self-pay | Admitting: Family Medicine

## 2017-09-12 DIAGNOSIS — R05 Cough: Secondary | ICD-10-CM

## 2017-09-12 DIAGNOSIS — I10 Essential (primary) hypertension: Secondary | ICD-10-CM

## 2017-09-12 DIAGNOSIS — J40 Bronchitis, not specified as acute or chronic: Secondary | ICD-10-CM

## 2017-09-12 DIAGNOSIS — I517 Cardiomegaly: Secondary | ICD-10-CM

## 2017-09-12 MED ORDER — BENZONATATE 100 MG PO CAPS: 100.0000 mg | ORAL_CAPSULE | Freq: Three times a day (TID) | ORAL | 0 refills | Status: DC | PRN

## 2017-09-12 MED ORDER — HYDROCODONE-HOMATROPINE 5-1.5 MG/5ML PO SYRP: 5.0000 mL | ORAL_SOLUTION | Freq: Four times a day (QID) | ORAL | 0 refills | Status: DC | PRN

## 2017-09-12 MED ORDER — CLONIDINE HCL 0.1 MG PO TABS: 0.1000 mg | ORAL_TABLET | Freq: Once | ORAL | 11 refills | Status: DC

## 2017-09-12 MED ORDER — CLONIDINE HCL 0.1 MG PO TABS
0.1000 mg | ORAL_TABLET | Freq: Once | ORAL | Status: AC
  Administered 2017-09-12: 0.1 mg via ORAL

## 2017-09-12 MED ORDER — CLONIDINE HCL 0.1 MG PO TABS
ORAL_TABLET | ORAL | Status: AC
  Filled 2017-09-12: qty 1

## 2017-09-12 NOTE — ED Provider Notes (Signed)
[place]   [id number] 09/12/17 Arrival Time: 4235   SUBJECTIVE:  Steven Chase is a 31 y.o. male who presents to the urgent care with complaint of URi symptoms since Wednesday.   Patient is a known hypertensive. He says that his medicine this morning.  No chest pain, dizziness, headache, fever, productive phlegm, sore throat, abdominal symptoms such as pain or nausea     Past Medical History:  Diagnosis Date  . Hypertension   . Obesity    Family History  Problem Relation Age of Onset  . Hypertension Mother   . Heart disease Maternal Grandfather   . Hypertension Maternal Grandfather    Social History   Socioeconomic History  . Marital status: Single    Spouse name: Not on file  . Number of children: 1  . Years of education: 19  . Highest education level: Not on file  Social Needs  . Financial resource strain: Not on file  . Food insecurity - worry: Not on file  . Food insecurity - inability: Not on file  . Transportation needs - medical: Not on file  . Transportation needs - non-medical: Not on file  Occupational History  . Occupation: Unloader  Tobacco Use  . Smoking status: Never Smoker  . Smokeless tobacco: Never Used  Substance and Sexual Activity  . Alcohol use: No  . Drug use: No  . Sexual activity: Not on file  Other Topics Concern  . Not on file  Social History Narrative   Born and raised in [place], [place]. Currently live in a private residence with his mother. Fun: Play with his daughter.   Denies religious beliefs that would effect health care.    No outpatient medications have been marked as taking for the 09/12/17 encounter [facility] Encounter).   No Known Allergies    ROS: As per HPI, remainder of ROS negative.   OBJECTIVE:   Vitals:   09/12/17 1434 09/12/17 1507  BP: (!) 190/131 (!) 177/123  Pulse: (!) 111 (!) 110  Resp: 18   Temp: 97.8 F (36.6 C)   SpO2: 100%      General appearance: alert; no  distress Eyes: PERRL; EOMI; conjunctiva normal HENT: normocephalic; atraumatic; TMs normal, canal normal, external ears normal without trauma; nasal mucosa normal; oral mucosa normal Neck: supple Lungs: few rales right base Heart: regular rate and rhythm Abdomen: soft, non-tender; bowel sounds normal; no masses or organomegaly; no guarding or rebound tenderness Back: no CVA tenderness Extremities: no cyanosis or edema; symmetrical with no gross deformities Skin: warm and dry Neurologic: normal gait; grossly normal Psychological: alert and cooperative; normal mood and affect      Labs:  Results for orders placed or performed in visit on 01/14/17  Comp Met (CMET)  Result Value Ref Range   Sodium 140 135 - 145 mEq/L   Potassium 3.0 (L) 3.5 - 5.1 mEq/L   Chloride 98 96 - 112 mEq/L   CO2 34 (H) 19 - 32 mEq/L   Glucose, Bld 113 (H) 70 - 99 mg/dL   BUN 15 6 - 23 mg/dL   Creatinine, Ser 1.24 0.40 - 1.50 mg/dL   Total Bilirubin 0.6 0.2 - 1.2 mg/dL   Alkaline Phosphatase 50 39 - 117 U/L   AST 33 0 - 37 U/L   ALT 42 0 - 53 U/L   Total Protein 7.6 6.0 - 8.3 g/dL   Albumin 4.3 3.5 - 5.2 g/dL   Calcium 10.0 8.4 - 10.5 mg/dL  GFR 87.28 >60.00 mL/min  Hemoglobin A1c  Result Value Ref Range   Hgb A1c MFr Bld 6.0 4.6 - 6.5 %    Labs Reviewed - No data to display  Dg Chest 2 View  Result Date: 09/12/2017 CLINICAL DATA:  Cough with fever.  Rales. EXAM: CHEST  2 VIEW COMPARISON:  None. FINDINGS: There is cardiomegaly with pulmonary vascularity within normal limits. The interstitium is mildly prominent with probable mild interstitial edema. No airspace consolidation. No adenopathy. No bone lesions. IMPRESSION: Cardiomegaly. Cannot exclude underlying pericardial effusion. Interstitial prominence with suspected mild interstitial edema. No consolidation. No demonstrable adenopathy. Electronically Signed   By: Lowella Grip III M.D.   On: 09/12/2017 15:29    CXR shows  cardiomegaly   ASSESSMENT & PLAN:  1. Bronchitis   2. Essential hypertension   3. Cardiomegaly     Meds ordered this encounter  Medications  . cloNIDine (CATAPRES) tablet 0.1 mg  . cloNIDine (CATAPRES) 0.1 MG tablet    Sig: Take 1 tablet (0.1 mg total) once for 1 dose by mouth.    Dispense:  60 tablet    Refill:  11  . benzonatate (TESSALON) 100 MG capsule    Sig: Take 1-2 capsules (100-200 mg total) 3 (three) times daily as needed by mouth for cough.    Dispense:  40 capsule    Refill:  0  . HYDROcodone-homatropine (HYDROMET) 5-1.5 MG/5ML syrup    Sig: Take 5 mLs every 6 (six) hours as needed by mouth for cough.    Dispense:  60 mL    Refill:  0    Reviewed expectations re: course of current medical issues. Questions answered. Outlined signs and symptoms indicating need for more acute intervention. Patient verbalized understanding. After Visit Summary given.    Procedures:      Robyn Haber, MD 09/12/17 [phone]

## 2017-09-12 NOTE — Discharge Instructions (Addendum)
You need to see your healthcare provider for blood pressure check later this week.

## 2017-09-12 NOTE — ED Triage Notes (Signed)
Pt here for URi symptoms since Wednesday.

## 2017-10-01 ENCOUNTER — Telehealth: Payer: Self-pay | Admitting: Family

## 2017-10-01 NOTE — Telephone Encounter (Signed)
Pt needs to have a hospital follow up to review medication changes done in the ED. Recommendation is for patient to follow ED provider instructions until they are seen.

## 2017-10-01 NOTE — Telephone Encounter (Signed)
Called pt to have him re-establish care with another provider because Tammy Sours is no longer here. There was an opening next week but he wanted to wait until the first of the year. He is scheduled to establish with Dr Jonny Ruiz. I told him that if any medications need to be refilled, he would have to be seen. He expressed understanding.

## 2017-10-01 NOTE — Telephone Encounter (Signed)
Copied from CRM [id number]. Topic: Quick Communication - See Telephone Encounter >> Oct 01, 2017  9:30 AM [place], Princella Pellegrini wrote: CRM for notification. See Telephone encounter for:  Pt mother Sue Lush wanted to let the dr know the pt went to the ER and they added a BP med clonidine, 2x a day. She wanted to check with the dr to see if that was ok.  10/01/17.

## 2017-11-04 ENCOUNTER — Ambulatory Visit: Payer: Self-pay | Admitting: Internal Medicine

## 2017-11-08 ENCOUNTER — Ambulatory Visit: Payer: Self-pay | Admitting: Internal Medicine

## 2017-11-28 ENCOUNTER — Ambulatory Visit (HOSPITAL_COMMUNITY)
Admission: EM | Admit: 2017-11-28 | Discharge: 2017-11-28 | Disposition: A | Discharge status: Home / Self Care | Payer: BLUE CROSS/BLUE SHIELD | Attending: Family Medicine | Admitting: Family Medicine

## 2017-11-28 ENCOUNTER — Encounter (HOSPITAL_COMMUNITY): Payer: Self-pay | Admitting: Emergency Medicine

## 2017-11-28 DIAGNOSIS — I10 Essential (primary) hypertension: Secondary | ICD-10-CM | POA: Insufficient documentation

## 2017-11-28 DIAGNOSIS — J029 Acute pharyngitis, unspecified: Secondary | ICD-10-CM | POA: Insufficient documentation

## 2017-11-28 DIAGNOSIS — Z6841 Body Mass Index (BMI) 40.0 and over, adult: Secondary | ICD-10-CM | POA: Insufficient documentation

## 2017-11-28 DIAGNOSIS — E669 Obesity, unspecified: Secondary | ICD-10-CM | POA: Diagnosis not present

## 2017-11-28 DIAGNOSIS — G4733 Obstructive sleep apnea (adult) (pediatric): Secondary | ICD-10-CM | POA: Diagnosis not present

## 2017-11-28 DIAGNOSIS — Z8249 Family history of ischemic heart disease and other diseases of the circulatory system: Secondary | ICD-10-CM | POA: Insufficient documentation

## 2017-11-28 LAB — POCT RAPID STREP A: STREPTOCOCCUS, GROUP A SCREEN (DIRECT): NEGATIVE

## 2017-11-28 MED ORDER — CLONIDINE HCL 0.1 MG PO TABS
0.2000 mg | ORAL_TABLET | Freq: Once | ORAL | Status: AC
  Administered 2017-11-28: 0.2 mg via ORAL

## 2017-11-28 MED ORDER — CLONIDINE HCL 0.1 MG PO TABS
ORAL_TABLET | ORAL | Status: AC
  Filled 2017-11-28: qty 1

## 2017-11-28 NOTE — ED Provider Notes (Addendum)
[facility]    CSN: [id number] Arrival date & time: 11/28/17  1226     History   Chief Complaint Chief Complaint  Patient presents with  . URI    HPI Steven Chase is a 32 y.o. male.   Developed a rapid onset of 8/10 sore throat (worse with swallowing) along with coughing yesterday. He complaints of having to breath hard at night and occasionally feels out of breath but no wheezing or chest pain. No congestion, running nose, sinus pain, ear pain, headache, abdominal pain or dizziness. No fever reported.   BP is high at 196/126 in the room but asymptomatic. He States that he takes medications for his hypertension but cannot recall the names of them. He reports that he takes 1 pill in the morning and 1 pill at night. The last time he checked his BP at home was 3 days ago and it was 170's systolic. He reports his BP usually runs in the 150's systolic.        Past Medical History:  Diagnosis Date  . Hypertension   . Obesity     Patient Active Problem List   Diagnosis Date Noted  . Routine adult health maintenance 12/10/2016  . Obesity 05/25/2016  . Lower leg edema 12/12/2014  . Essential hypertension 11/05/2014  . OSA (obstructive sleep apnea) 11/05/2014    Past Surgical History:  Procedure Laterality Date  . HERNIA REPAIR         Home Medications    Prior to Admission medications   Medication Sig Start Date End Date Taking? Authorizing Provider  amLODipine (NORVASC) 10 MG tablet Take 1 tablet (10 mg total) by mouth daily. 02/12/17  Yes Veryl Speak, FNP  chlorthalidone (HYGROTON) 25 MG tablet Take 1 tablet (25 mg total) by mouth daily. 02/12/17  Yes Veryl Speak, FNP  metoprolol succinate (TOPROL-XL) 50 MG 24 hr tablet Take 1 tablet (50 mg total) by mouth daily. Take with or immediately following a meal. 01/26/17  Yes Calone, Tama Headings, FNP  benzonatate (TESSALON) 100 MG capsule Take 1-2 capsules (100-200 mg total) 3 (three) times daily as  needed by mouth for cough. 09/12/17   Elvina Sidle, MD  cloNIDine (CATAPRES) 0.1 MG tablet Take 1 tablet (0.1 mg total) once for 1 dose by mouth. 09/12/17 09/12/17  Elvina Sidle, MD  HYDROcodone-homatropine (HYDROMET) 5-1.5 MG/5ML syrup Take 5 mLs every 6 (six) hours as needed by mouth for cough. 09/12/17   Elvina Sidle, MD    Family History Family History  Problem Relation Age of Onset  . Hypertension Mother   . Heart disease Maternal Grandfather   . Hypertension Maternal Grandfather     Social History Social History   Tobacco Use  . Smoking status: Never Smoker  . Smokeless tobacco: Never Used  Substance Use Topics  . Alcohol use: No  . Drug use: No     Allergies   Patient has no known allergies.   Review of Systems Review of Systems  Constitutional: Positive for fatigue (only at night). Negative for chills and fever.  HENT: Positive for sneezing and sore throat. Negative for congestion, rhinorrhea and sinus pain.   Respiratory: Positive for cough and shortness of breath (at night). Negative for wheezing.   Cardiovascular: Negative for chest pain and palpitations.  Gastrointestinal: Negative for abdominal pain, nausea and vomiting.  Musculoskeletal: Negative for myalgias.  Skin: Negative for rash.  Neurological: Negative for dizziness and headaches.     Physical Exam  Triage Vital Signs ED Triage Vitals  Enc Vitals Group     BP 11/28/17 1336 (!) 196/126     Pulse Rate 11/28/17 1336 (!) 105     Resp 11/28/17 1336 18     Temp 11/28/17 1336 97.8 F (36.6 C)     Temp Source 11/28/17 1336 Temporal     SpO2 11/28/17 1336 98 %     Weight 11/28/17 1335 (!) 328 lb (148.8 kg)     Height 11/28/17 1335 5\' 6"  (1.676 m)     Head Circumference --      Peak Flow --      Pain Score 11/28/17 1335 6     Pain Loc --      Pain Edu? --      Excl. in GC? --    No data found.  Updated Vital Signs BP (!) 196/126   Pulse (!) 105   Temp 97.8 F (36.6 C)  (Temporal)   Resp 18   Ht 5\' 6"  (1.676 m)   Wt (!) 328 lb (148.8 kg)   SpO2 98%   BMI 52.94 kg/m   Physical Exam  Constitutional: He is oriented to person, place, and time. He appears well-developed and well-nourished.  HENT:  Head: Normocephalic and atraumatic.  Right Ear: External ear normal.  Left Ear: External ear normal.  Nose: Nose normal.  Mouth/Throat: Oropharynx is clear and moist. No oropharyngeal exudate.  Eyes: Conjunctivae are normal. Pupils are equal, round, and reactive to light.  Neck: Normal range of motion. Neck supple.  Cardiovascular: Normal rate, regular rhythm and normal heart sounds.  No murmur heard. Pulmonary/Chest: Effort normal and breath sounds normal. He has no wheezes.  Abdominal: Soft. Bowel sounds are normal. There is no tenderness.  Neurological: He is alert and oriented to person, place, and time.  Skin: Skin is warm and dry.  Psychiatric: He has a normal mood and affect.  Nursing note and vitals reviewed.    UC Treatments / Results  Labs (all labs ordered are listed, but only abnormal results are displayed) Labs Reviewed - No data to display  EKG  EKG Interpretation None       Radiology No results found.  Procedures Procedures (including critical care time)  Medications Ordered in UC Medications - No data to display   Initial Impression / Assessment and Plan / UC Course  I have reviewed the triage vital signs and the nursing notes.  Pertinent labs & imaging results that were available during my care of the patient were reviewed by me and considered in my medical decision making (see chart for details).   Final Clinical Impressions(s) / UC Diagnoses   Final diagnoses:  None   Hypertension: 14:15: Spoke to patient's pharmacist. His last amlodipine refill was 1 year ago. His last Metoprolol and last Lisinopril were both refilled 12/2016 and his last Clonidine refill was 09/2017. Therefore questioning patient's compliance.     14:23: Micah Flesher back to discuss more with patient, in which he then disclosed that he is currently just taking clonidine 0.1 mg BID. He is unsure if he is supposed to take the other pills.   14:55: BP repeated, which has not improved. He remains asymptomatic. He declined ER visit despite education.   15:05: Spend a good amount of time educating patient, he is now willing to go to the ER. Discharge paper given. Direction for ER given.   Sore Throat: Rapid strep negative; culture pending. Most likely viral. Please treat symptomatically  with salt water gargle, honey, throat lozenges or chloraseptic spray.    ED Discharge Orders    None     Controlled Substance Prescriptions [place] Controlled Substance Registry consulted? Not Applicable   Lucia Estelle, NP 11/28/17 1505    Lucia Estelle, NP 11/28/17 1506

## 2017-11-28 NOTE — ED Triage Notes (Addendum)
PT reports cough, SOB, and sore throat that started yesterday.   PT hypertensive in triage, but denies associated symptoms

## 2017-11-28 NOTE — Discharge Instructions (Signed)
Please go to the ER right now for your high blood pressure.

## 2017-11-30 ENCOUNTER — Ambulatory Visit: Payer: Self-pay | Admitting: *Deleted

## 2017-11-30 LAB — CULTURE, GROUP A STREP (THRC)

## 2017-11-30 NOTE — Telephone Encounter (Signed)
After talking with him in further detail he just needed to schedule a follow up appt with his doctor from his ED visit he had on Sunday.    He was diagnosed with viral pharyngitis.  With further questioning he was not having any further problems as far as getting worse.   He just needed a follow up.   It was difficult to triage him as he was vague with his answers and what he was supposed to be doing after the ED visit. The agent made him an appt with Dr. Jonny Ruiz for Thursday 12/02/17 at 1:20. Pt verbalized understanding of this and said he wrote it down as I slowly told him the date and time of his appt.     Reason for Disposition . [1] MILD longstanding difficulty breathing AND [2]  SAME as normal  Answer Assessment - Initial Assessment Questions 1. RESPIRATORY STATUS: "Describe your breathing?" (e.g., wheezing, shortness of breath, unable to speak, severe coughing)      I feel short of breath.   I was seen in the ED on Sunday.  Has a virus.   2. ONSET: "When did this breathing problem begin?"      Friday.   I was seen in the ED on Sunday.   3. PATTERN "Does the difficult breathing come and go, or has it been constant since it started?"      *No Answer* 4. SEVERITY: "How bad is your breathing?" (e.g., mild, moderate, severe)    - MILD: No SOB at rest, mild SOB with walking, speaks normally in sentences, can lay down, no retractions, pulse < 100.    - MODERATE: SOB at rest, SOB with minimal exertion and prefers to sit, cannot lie down flat, speaks in phrases, mild retractions, audible wheezing, pulse 100-120.    - SEVERE: Very SOB at rest, speaks in single words, struggling to breathe, sitting hunched forward, retractions, pulse > 120      *No Answer* 5. RECURRENT SYMPTOM: "Have you had difficulty breathing before?" If so, ask: "When was the last time?" and "What happened that time?"      *No Answer* 6. CARDIAC HISTORY: "Do you have any history of heart disease?" (e.g., heart attack, angina, bypass  surgery, angioplasty)      *No Answer* 7. LUNG HISTORY: "Do you have any history of lung disease?"  (e.g., pulmonary embolus, asthma, emphysema)     *No Answer* 8. CAUSE: "What do you think is causing the breathing problem?"      *No Answer* 9. OTHER SYMPTOMS: "Do you have any other symptoms? (e.g., dizziness, runny nose, cough, chest pain, fever)     *No Answer* 10. PREGNANCY: "Is there any chance you are pregnant?" "When was your last menstrual period?"       *No Answer* 11. TRAVEL: "Have you traveled out of the country in the last month?" (e.g., travel history, exposures)       *No Answer*  Protocols used: BREATHING DIFFICULTY-A-AH

## 2017-12-02 ENCOUNTER — Ambulatory Visit: Payer: BLUE CROSS/BLUE SHIELD | Admitting: Internal Medicine

## 2017-12-02 ENCOUNTER — Encounter: Payer: Self-pay | Admitting: Internal Medicine

## 2017-12-02 VITALS — BP 132/86 | HR 103 | Temp 98.6°F | Ht 66.0 in | Wt 231.0 lb

## 2017-12-02 DIAGNOSIS — R059 Cough, unspecified: Secondary | ICD-10-CM | POA: Insufficient documentation

## 2017-12-02 DIAGNOSIS — E876 Hypokalemia: Secondary | ICD-10-CM

## 2017-12-02 DIAGNOSIS — R739 Hyperglycemia, unspecified: Secondary | ICD-10-CM | POA: Diagnosis not present

## 2017-12-02 DIAGNOSIS — Z23 Encounter for immunization: Secondary | ICD-10-CM | POA: Diagnosis not present

## 2017-12-02 DIAGNOSIS — R05 Cough: Secondary | ICD-10-CM

## 2017-12-02 DIAGNOSIS — I10 Essential (primary) hypertension: Secondary | ICD-10-CM | POA: Diagnosis not present

## 2017-12-02 DIAGNOSIS — M7652 Patellar tendinitis, left knee: Secondary | ICD-10-CM | POA: Diagnosis not present

## 2017-12-02 DIAGNOSIS — Z0001 Encounter for general adult medical examination with abnormal findings: Secondary | ICD-10-CM

## 2017-12-02 MED ORDER — NAPROXEN 500 MG PO TABS: 500.0000 mg | ORAL_TABLET | Freq: Two times a day (BID) | ORAL | 2 refills | Status: DC

## 2017-12-02 MED ORDER — POTASSIUM CHLORIDE CRYS ER 10 MEQ PO TBCR: 10.0000 meq | EXTENDED_RELEASE_TABLET | Freq: Two times a day (BID) | ORAL | 3 refills | Status: DC

## 2017-12-02 MED ORDER — AZITHROMYCIN 250 MG PO TABS: ORAL_TABLET | ORAL | 1 refills | Status: DC

## 2017-12-02 MED ORDER — HYDROCODONE-HOMATROPINE 5-1.5 MG/5ML PO SYRP: 5.0000 mL | ORAL_SOLUTION | Freq: Four times a day (QID) | ORAL | 0 refills | Status: DC | PRN

## 2017-12-02 MED ORDER — HYDROCHLOROTHIAZIDE 25 MG PO TABS: 25.0000 mg | ORAL_TABLET | Freq: Every day | ORAL | 3 refills | Status: DC

## 2017-12-02 NOTE — Assessment & Plan Note (Addendum)
Mild to mod, for bronchitis vs pna, declines cxr, for antibx course,  to f/u any worsening symptoms or concerns  In addition to the time spent performing CPE, I spent an additional 20 minutes face to face,in which greater than 50% of this time was spent in counseling and coordination of care for patient's illness as documented, including the differential dx, treatment, further evaluation and other management of acute cough, HTN, hypokalemia, hyperglycemia, and knee tendonitis

## 2017-12-02 NOTE — Assessment & Plan Note (Signed)
stable overall by history and exam, recent data reviewed with pt, and pt to continue medical treatment as before,  to f/u any worsening symptoms or concerns Lab Results  Component Value Date   HGBA1C 6.0 01/14/2017   

## 2017-12-02 NOTE — Patient Instructions (Addendum)
You had the flu shot today  Ok to stop the chlorthalidone  Please take all new medication as prescribed  - the hydrochlorothiazide 25 mg per day, and the Potassium pill once per day  Please take all new medication as prescribed - the antibiotic, and cough medicine as needed, and the Naproxyn (anti-inflammatory) for the left knee pain  Please consider seeing Sports Medicine in this office if you are not improving in 3-5 days  Please continue all other medications as before, and refills have been done if requested.  Please have the pharmacy call with any other refills you may need.  Please continue your efforts at being more active, low cholesterol diet, and weight control.  You are otherwise up to date with prevention measures today.  Please keep your appointments with your specialists as you may have planned  Please go to the LAB in the Basement (turn left off the elevator) for the tests to be done today  You will be contacted by phone if any changes need to be made immediately.  Otherwise, you will receive a letter about your results with an explanation, but please check with MyChart first.  Please remember to sign up for MyChart if you have not done so, as this will be important to you in the future with finding out test results, communicating by private email, and scheduling acute appointments online when needed.  Please return in 6 months, or sooner if needed

## 2017-12-02 NOTE — Assessment & Plan Note (Signed)
Mild, for nsaid prn, consider f/u with sports medicine

## 2017-12-02 NOTE — Assessment & Plan Note (Signed)

## 2017-12-02 NOTE — Assessment & Plan Note (Signed)
Mild persistert, for f/u with lab today

## 2017-12-02 NOTE — Addendum Note (Signed)
Addended by: Corwin Levins on: 12/02/2017 09:32 PM   Modules accepted: Level of Service

## 2017-12-02 NOTE — Assessment & Plan Note (Addendum)
stable overall by history and exam, recent data reviewed with pt, and pt to continue medical treatment as before except change the chlorthalidone to hct 25 qd,  to f/u any worsening symptoms or concerns BP Readings from Last 3 Encounters:  12/02/17 132/86  11/28/17 (!) 183/127  09/12/17 (!) 175/119

## 2017-12-02 NOTE — Progress Notes (Signed)
Subjective:    Patient ID: Steven Chase, male    DOB: 1986-06-15, 32 y.o.   MRN: [id number]  HPI  Here for wellness and f/u;  Overall doing ok;  Pt denies Chest pain, worsening SOB, DOE, wheezing, orthopnea, PND, worsening LE edema, palpitations, dizziness or syncope.  Pt denies neurological change such as new headache, facial or extremity weakness.  Pt denies polydipsia, polyuria, or low sugar symptoms. Pt states overall good compliance with treatment and medications, good tolerability, and has been trying to follow appropriate diet.  Pt denies worsening depressive symptoms, suicidal ideation or panic. No fever, night sweats, wt loss, loss of appetite, or other constitutional symptoms.  Pt states good ability with ADL's, has low fall risk, home safety reviewed and adequate, no other significant changes in hearing or vision, and only occasionally active with exercise. Also here with acute onset mild to mod 2-3 days ST, HA, general weakness and malaise, with prod cough greenish sputum, but Pt denies chest pain, increased sob or doe, wheezing, orthopnea, PND, increased LE swelling, palpitations, dizziness or syncope. Also for f/u low K with last labs.  Also c/o anterior left knee pain, tender, swelling, constant x 1 wk, worse to walk, better to sit but with leg more extended, No giveaways or falls  No other new complaints or interval change Past Medical History:  Diagnosis Date  . Hypertension   . Obesity   . OSA (obstructive sleep apnea) 11/05/2014   02/2015 -severe -AHI 106/h    Past Surgical History:  Procedure Laterality Date  . HERNIA REPAIR      reports that  has never smoked. he has never used smokeless tobacco. He reports that he does not drink alcohol or use drugs. family history includes Heart disease in his maternal grandfather; Hypertension in his maternal grandfather, mother, and sister. No Known Allergies Current Outpatient Medications on File Prior to Visit  Medication Sig  Dispense Refill  . amLODipine (NORVASC) 10 MG tablet Take 1 tablet (10 mg total) by mouth daily. 90 tablet 2  . benzonatate (TESSALON) 100 MG capsule Take 1-2 capsules (100-200 mg total) 3 (three) times daily as needed by mouth for cough. 40 capsule 0  . metoprolol succinate (TOPROL-XL) 50 MG 24 hr tablet Take 1 tablet (50 mg total) by mouth daily. Take with or immediately following a meal. 90 tablet 3   No current facility-administered medications on file prior to visit.    Review of Systems Constitutional: Negative for other unusual diaphoresis, sweats, appetite or weight changes HENT: Negative for other worsening hearing loss, ear pain, facial swelling, mouth sores or neck stiffness.   Eyes: Negative for other worsening pain, redness or other visual disturbance.  Respiratory: Negative for other stridor or swelling Cardiovascular: Negative for other palpitations or other chest pain  Gastrointestinal: Negative for worsening diarrhea or loose stools, blood in stool, distention or other pain Genitourinary: Negative for hematuria, flank pain or other change in urine volume.  Musculoskeletal: Negative for myalgias or other joint swelling.  Skin: Negative for other color change, or other wound or worsening drainage.  Neurological: Negative for other syncope or numbness. Hematological: Negative for other adenopathy or swelling Psychiatric/Behavioral: Negative for hallucinations, other worsening agitation, SI, self-injury, new decreased concentration All other system neg per pt    Objective:   Physical Exam BP 132/86   Pulse (!) 103   Temp 98.6 F (37 C) (Oral)   Ht 5\' 6"  (1.676 m)   Wt 231 lb (104.8  kg)   SpO2 98%   BMI 37.28 kg/m  VS noted, mild ill Constitutional: Pt is oriented to person, place, and time. Appears well-developed and well-nourished, in no significant distress and comfortable Head: Normocephalic and atraumatic  Eyes: Conjunctivae and EOM are normal. Pupils are equal,  round, and reactive to light Bilat tm's with mild erythema.  Max sinus areas mild tender.  Pharynx with mild erythema, no exudate Right Ear: External ear normal without discharge Left Ear: External ear normal without discharge Nose: Nose without discharge or deformity Mouth/Throat: Oropharynx is without other ulcerations and moist  Neck: Normal range of motion. Neck supple. No JVD present. No tracheal deviation present or significant neck LA or mass Cardiovascular: Normal rate, regular rhythm, normal heart sounds and intact distal pulses.   Pulmonary/Chest: WOB normal and breath sounds without rales or wheezing  Abdominal: Soft. Bowel sounds are normal. NT. No HSM  Musculoskeletal: Normal range of motion. Exhibits no edema Lymphadenopathy: Has no other cervical adenopathy.  Neurological: Pt is alert and oriented to person, place, and time. Pt has normal reflexes. No cranial nerve deficit. Motor grossly intact, Gait intact Left knee with tender patellar tendon with knee o/w without effusion and FROM Skin: Skin is warm and dry. No rash noted or new ulcerations Psychiatric:  Has normal mood and affect. Behavior is normal without agitation No other exam findings    Assessment & Plan:

## 2017-12-21 ENCOUNTER — Ambulatory Visit: Payer: Self-pay | Admitting: Internal Medicine

## 2017-12-21 DIAGNOSIS — Z0289 Encounter for other administrative examinations: Secondary | ICD-10-CM

## 2018-01-12 ENCOUNTER — Other Ambulatory Visit: Payer: Self-pay

## 2018-01-12 ENCOUNTER — Encounter (HOSPITAL_COMMUNITY): Payer: Self-pay | Admitting: Emergency Medicine

## 2018-01-12 ENCOUNTER — Ambulatory Visit (HOSPITAL_COMMUNITY)
Admission: EM | Admit: 2018-01-12 | Discharge: 2018-01-12 | Disposition: A | Discharge status: Home / Self Care | Payer: BLUE CROSS/BLUE SHIELD | Attending: Family Medicine | Admitting: Family Medicine

## 2018-01-12 DIAGNOSIS — R369 Urethral discharge, unspecified: Secondary | ICD-10-CM | POA: Diagnosis not present

## 2018-01-12 LAB — POCT URINALYSIS DIP (DEVICE)
Bilirubin Urine: NEGATIVE
Glucose, UA: NEGATIVE mg/dL
Ketones, ur: NEGATIVE mg/dL
Leukocytes, UA: NEGATIVE
Nitrite: NEGATIVE
PH: 6 (ref 5.0–8.0)
Protein, ur: >=300 mg/dL — AB
UROBILINOGEN UA: 0.2 mg/dL (ref 0.0–1.0)

## 2018-01-12 MED ORDER — AZITHROMYCIN 250 MG PO TABS
ORAL_TABLET | ORAL | Status: AC
  Filled 2018-01-12: qty 4

## 2018-01-12 MED ORDER — CEFTRIAXONE SODIUM 250 MG IJ SOLR
INTRAMUSCULAR | Status: AC
  Filled 2018-01-12: qty 250

## 2018-01-12 MED ORDER — CEFTRIAXONE SODIUM 250 MG IJ SOLR
250.0000 mg | Freq: Once | INTRAMUSCULAR | Status: AC
  Administered 2018-01-12: 250 mg via INTRAMUSCULAR

## 2018-01-12 MED ORDER — STERILE WATER FOR INJECTION IJ SOLN
INTRAMUSCULAR | Status: AC
  Filled 2018-01-12: qty 10

## 2018-01-12 MED ORDER — AZITHROMYCIN 250 MG PO TABS
1000.0000 mg | ORAL_TABLET | Freq: Once | ORAL | Status: AC
  Administered 2018-01-12: 1000 mg via ORAL

## 2018-01-12 NOTE — ED Provider Notes (Signed)
  [facility]   [id number] 01/12/18 Arrival Time: 1002  ASSESSMENT & PLAN:  1. Penile discharge     Meds ordered this encounter  Medications  . cefTRIAXone (ROCEPHIN) injection 250 mg  . azithromycin (ZITHROMAX) tablet 1,000 mg   Pending: Labs Reviewed  URINE CYTOLOGY ANCILLARY ONLY   Will notify of any positive results. Instructed to refrain from sexual activity for at least seven days.  Reviewed expectations re: course of current medical issues. Questions answered. Outlined signs and symptoms indicating need for more acute intervention. Patient verbalized understanding. After Visit Summary given.   SUBJECTIVE:  Steven Chase is a 32 y.o. male who presents with complaint of penile discharge. Onset abrupt, 1 day ago. Describes discharge as thick and white/yellow. Urinary symptoms: none. Afebrile. No abdominal or pelvic pain. No n/v. No rashes or lesions. Sexually active with single male partner. OTC treatment: none. History of similar symptoms: No.  ROS: As per HPI.  OBJECTIVE:  Vitals:   01/12/18 1017  BP: (!) 92/59  Pulse: (!) 105  Resp: 16  Temp: 97.9 F (36.6 C)  SpO2: 96%     General appearance: alert, cooperative, appears stated age and no distress Throat: lips, mucosa, and tongue normal; teeth and gums normal Back: no CVA tenderness Abdomen: soft, non-tender; bowel sounds normal; no guarding or rebound tenderness GU: declines Skin: warm and dry Psychological:  Alert and cooperative. Normal mood and affect.   Labs Reviewed  URINE CYTOLOGY ANCILLARY ONLY    No Known Allergies  Past Medical History:  Diagnosis Date  . Hypertension   . Obesity   . OSA (obstructive sleep apnea) 11/05/2014   02/2015 -severe -AHI 106/h    Family History  Problem Relation Age of Onset  . Hypertension Mother   . Heart disease Maternal Grandfather   . Hypertension Maternal Grandfather   . Hypertension Sister    Social History   Socioeconomic History   . Marital status: Single    Spouse name: Not on file  . Number of children: 1  . Years of education: 78  . Highest education level: Not on file  Social Needs  . Financial resource strain: Not on file  . Food insecurity - worry: Not on file  . Food insecurity - inability: Not on file  . Transportation needs - medical: Not on file  . Transportation needs - non-medical: Not on file  Occupational History  . 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 Smoking status: Never Smoker  . Smokeless tobacco: Never Used  Substance and Sexual Activity  . Alcohol use: No  . Drug use: No  . Sexual activity: Not on file  Other Topics Concern  . Not on file  Social History Narrative   Born and raised in [place], [place]. Currently live in a private residence with his mother. Fun: Play with his daughter.   Denies religious beliefs that would effect health care.           Mardella Layman, MD 01/12/18 1034

## 2018-01-12 NOTE — Discharge Instructions (Signed)

## 2018-01-12 NOTE — ED Triage Notes (Signed)
Pt states when he urinates, he notices some discharge coming out the tip of his penis. Noticed it yesterday. Denies any pain.

## 2018-01-13 LAB — URINE CYTOLOGY ANCILLARY ONLY
Chlamydia: NEGATIVE
Neisseria Gonorrhea: NEGATIVE
Trichomonas: NEGATIVE

## 2018-01-21 ENCOUNTER — Ambulatory Visit: Payer: BLUE CROSS/BLUE SHIELD | Admitting: Internal Medicine

## 2018-01-21 ENCOUNTER — Encounter: Payer: Self-pay | Admitting: Internal Medicine

## 2018-01-21 VITALS — BP 118/78 | HR 100 | Temp 98.5°F | Ht 66.0 in | Wt 232.0 lb

## 2018-01-21 DIAGNOSIS — J309 Allergic rhinitis, unspecified: Secondary | ICD-10-CM | POA: Diagnosis not present

## 2018-01-21 DIAGNOSIS — R05 Cough: Secondary | ICD-10-CM | POA: Diagnosis not present

## 2018-01-21 DIAGNOSIS — I10 Essential (primary) hypertension: Secondary | ICD-10-CM

## 2018-01-21 DIAGNOSIS — R739 Hyperglycemia, unspecified: Secondary | ICD-10-CM

## 2018-01-21 DIAGNOSIS — R059 Cough, unspecified: Secondary | ICD-10-CM

## 2018-01-21 MED ORDER — TRIAMCINOLONE ACETONIDE 55 MCG/ACT NA AERO: 2.0000 | INHALATION_SPRAY | Freq: Every day | NASAL | 12 refills | Status: DC

## 2018-01-21 MED ORDER — CETIRIZINE HCL 10 MG PO TABS: 10.0000 mg | ORAL_TABLET | Freq: Every day | ORAL | 11 refills | Status: DC

## 2018-01-21 MED ORDER — METHYLPREDNISOLONE ACETATE 80 MG/ML IJ SUSP
80.0000 mg | Freq: Once | INTRAMUSCULAR | Status: AC
  Administered 2018-01-21: 80 mg via INTRAMUSCULAR

## 2018-01-21 NOTE — Progress Notes (Signed)
Subjective:    Patient ID: Steven Chase, male    DOB: 16-Aug-1986, 32 y.o.   MRN: [id number]  HPI  Here to f/u recent ED visit, with testing neg for STD as ordered, but did seem to improve with tx and Denies urinary symptoms such as dysuria, frequency, urgency, flank pain, hematuria or n/v, fever, chills. Also 11/2017 cough improved with tx antibiotic but now return of non prod cough, worse to go outside. Does have several wks ongoing nasal allergy symptoms with clearish congestion, itch and sneezing, without fever, pain, ST, swelling or wheezing. No other interval hx or new complaints Past Medical History:  Diagnosis Date  . Hypertension   . Obesity   . OSA (obstructive sleep apnea) 11/05/2014   02/2015 -severe -AHI 106/h    Past Surgical History:  Procedure Laterality Date  . HERNIA REPAIR      reports that he has never smoked. He has never used smokeless tobacco. He reports that he does not drink alcohol or use drugs. family history includes Heart disease in his maternal grandfather; Hypertension in his maternal grandfather, mother, and sister. No Known Allergies Current Outpatient Medications on File Prior to Visit  Medication Sig Dispense Refill  . amLODipine (NORVASC) 10 MG tablet Take 1 tablet (10 mg total) by mouth daily. 90 tablet 2  . azithromycin (ZITHROMAX Z-PAK) 250 MG tablet 2 tab by mouth day 1, then 1 per day 6 tablet 1  . benzonatate (TESSALON) 100 MG capsule Take 1-2 capsules (100-200 mg total) 3 (three) times daily as needed by mouth for cough. 40 capsule 0  . hydrochlorothiazide (HYDRODIURIL) 25 MG tablet Take 1 tablet (25 mg total) by mouth daily. 90 tablet 3  . HYDROcodone-homatropine (HYDROMET) 5-1.5 MG/5ML syrup Take 5 mLs by mouth every 6 (six) hours as needed for cough. 180 mL 0  . metoprolol succinate (TOPROL-XL) 50 MG 24 hr tablet Take 1 tablet (50 mg total) by mouth daily. Take with or immediately following a meal. 90 tablet 3  . naproxen (NAPROSYN) 500 MG  tablet Take 1 tablet (500 mg total) by mouth 2 (two) times daily with a meal. 60 tablet 2  . potassium chloride (KLOR-CON M10) 10 MEQ tablet Take 1 tablet (10 mEq total) by mouth 2 (two) times daily. 90 tablet 3   No current facility-administered medications on file prior to visit.    Review of Systems  Constitutional: Negative for other unusual diaphoresis or sweats HENT: Negative for ear discharge or swelling Eyes: Negative for other worsening visual disturbances Respiratory: Negative for stridor or other swelling  Gastrointestinal: Negative for worsening distension or other blood Genitourinary: Negative for retention or other urinary change Musculoskeletal: Negative for other MSK pain or swelling Skin: Negative for color change or other new lesions Neurological: Negative for worsening tremors and other numbness  Psychiatric/Behavioral: Negative for worsening agitation or other fatigue All other system neg per pt    Objective:   Physical Exam BP 118/78   Pulse 100   Temp 98.5 F (36.9 C) (Oral)   Ht 5\' 6"  (1.676 m)   Wt 232 lb (105.2 kg)   SpO2 99%   BMI 37.45 kg/m  VS noted, morbid obese Constitutional: Pt appears in NAD HENT: Head: NCAT.  Right Ear: External ear normal.  Left Ear: External ear normal.  Eyes: . Pupils are equal, round, and reactive to light. Conjunctivae and EOM are normal Bilat tm's with mild erythema.  Max sinus areas non tender.  Pharynx with  mild erythema, no exudate Nose: without d/c or deformity Neck: Neck supple. Gross normal ROM Cardiovascular: Normal rate and regular rhythm.   Pulmonary/Chest: Effort normal and breath sounds without rales or wheezing.  Abd:  Soft, NT, ND, + BS, no organomegaly Neurological: Pt is alert. At baseline orientation, motor grossly intact Skin: Skin is warm. No rashes, other new lesions, no LE edema Psychiatric: Pt behavior is normal without agitation  No other exam findings    Assessment & Plan:

## 2018-01-21 NOTE — Patient Instructions (Addendum)
You had the steroid shot today  Please take all new medication as prescribed - the zyrtec and nasacort (sent to [facility])  Please continue all other medications as before, and refills have been done if requested.  Please have the pharmacy call with any other refills you may need.  Please keep your appointments with your specialists as you may have planned

## 2018-01-23 NOTE — Assessment & Plan Note (Signed)
Has been assoc with infectious symptoms in jan 2019, but current more likely related to post nasal gtt

## 2018-01-23 NOTE — Assessment & Plan Note (Signed)
stable overall by history and exam, recent data reviewed with pt, and pt to continue medical treatment as before,  to f/u any worsening symptoms or concerns Lab Results  Component Value Date   HGBA1C 6.0 01/14/2017   

## 2018-01-23 NOTE — Assessment & Plan Note (Signed)
.  Mild to mod, for depomedrol IM, zyrtec and nasacort asd,   to f/u any worsening symptoms or concerns

## 2018-01-23 NOTE — Assessment & Plan Note (Signed)
stable overall by history and exam, recent data reviewed with pt, and pt to continue medical treatment as before,  to f/u any worsening symptoms or concerns BP Readings from Last 3 Encounters:  01/21/18 118/78  01/12/18 (!) 92/59  12/02/17 132/86

## 2018-01-26 ENCOUNTER — Other Ambulatory Visit: Payer: Self-pay

## 2018-01-26 ENCOUNTER — Ambulatory Visit (HOSPITAL_COMMUNITY)
Admission: EM | Admit: 2018-01-26 | Discharge: 2018-01-26 | Disposition: A | Discharge status: Home / Self Care | Payer: BLUE CROSS/BLUE SHIELD | Attending: Family Medicine | Admitting: Family Medicine

## 2018-01-26 ENCOUNTER — Encounter (HOSPITAL_COMMUNITY): Payer: Self-pay | Admitting: Emergency Medicine

## 2018-01-26 DIAGNOSIS — H1032 Unspecified acute conjunctivitis, left eye: Secondary | ICD-10-CM | POA: Diagnosis not present

## 2018-01-26 MED ORDER — POLYMYXIN B-TRIMETHOPRIM 10000-0.1 UNIT/ML-% OP SOLN
1.0000 [drp] | OPHTHALMIC | 0 refills | Status: AC
Start: 2018-01-26 — End: 2018-02-02

## 2018-01-26 NOTE — ED Triage Notes (Signed)
Left eye red, swollen, but no pain.  Noticed yesterday while at work.  denies vision changes

## 2018-01-26 NOTE — Discharge Instructions (Addendum)
Please use polytrim eye drops every 4 hours, if symptoms start developing in right eye use drop in both eyes  Have good hand hygiene  Cool compresses for discomfort and crusting

## 2018-01-26 NOTE — ED Provider Notes (Signed)
[facility]    CSN: [id number] Arrival date & time: 01/26/18  4982     History   Chief Complaint Chief Complaint  Patient presents with  . Eye Problem    HPI Steven Chase is a 32 y.o. male presenting today with redness to his left eye.  He was sent home from work as they were concerned about pinkeye.  Symptoms began yesterday.  He is also had watery drainage as well as crusting in the morning.  Denies any pain, does endorse mild irritation or itching.  Patient does not wear contacts.  Patient denies any headache, one-sided weakness.  Denies any other symptoms of URI including congestion, cough, sore throat.  Denies fevers.  HPI  Past Medical History:  Diagnosis Date  . Hypertension   . Obesity   . OSA (obstructive sleep apnea) 11/05/2014   02/2015 -severe -AHI 106/h     Patient Active Problem List   Diagnosis Date Noted  . Allergic rhinitis 01/21/2018  . Hyperglycemia 12/02/2017  . Hypokalemia 12/02/2017  . Cough 12/02/2017  . Patellar tendonitis of left knee 12/02/2017  . Encounter for well adult exam with abnormal findings 12/10/2016  . Obesity 05/25/2016  . Lower leg edema 12/12/2014  . Essential hypertension 11/05/2014  . OSA (obstructive sleep apnea) 11/05/2014    Past Surgical History:  Procedure Laterality Date  . HERNIA REPAIR         Home Medications    Prior to Admission medications   Medication Sig Start Date End Date Taking? Authorizing Provider  hydrochlorothiazide (HYDRODIURIL) 25 MG tablet Take 1 tablet (25 mg total) by mouth daily. 12/02/17  Yes Corwin Levins, MD  metoprolol succinate (TOPROL-XL) 50 MG 24 hr tablet Take 1 tablet (50 mg total) by mouth daily. Take with or immediately following a meal. 01/26/17  Yes Calone, Tama Headings, FNP  amLODipine (NORVASC) 10 MG tablet Take 1 tablet (10 mg total) by mouth daily. 02/12/17   Veryl Speak, FNP  naproxen (NAPROSYN) 500 MG tablet Take 1 tablet (500 mg total) by mouth 2 (two) times  daily with a meal. 12/02/17   Corwin Levins, MD  triamcinolone (NASACORT) 55 MCG/ACT AERO nasal inhaler Place 2 sprays into the nose daily. 01/21/18   Corwin Levins, MD  trimethoprim-polymyxin b (POLYTRIM) ophthalmic solution Place 1-2 drops into the left eye every 4 (four) hours for 7 days. 01/26/18 02/02/18  Wieters, Junius Creamer, PA-C    Family History Family History  Problem Relation Age of Onset  . Hypertension Mother   . Heart disease Maternal Grandfather   . Hypertension Maternal Grandfather   . Hypertension Sister     Social History Social History   Tobacco Use  . Smoking status: Never Smoker  . Smokeless tobacco: Never Used  Substance Use Topics  . Alcohol use: No  . Drug use: No     Allergies   Patient has no known allergies.   Review of Systems Review of Systems  Constitutional: Negative for activity change, appetite change, fatigue and fever.  HENT: Negative for congestion, ear pain, postnasal drip, rhinorrhea, sinus pressure and sore throat.   Eyes: Positive for discharge, redness and itching. Negative for photophobia, pain and visual disturbance.  Respiratory: Negative for cough and shortness of breath.   Cardiovascular: Negative for chest pain.  Gastrointestinal: Negative for abdominal pain, diarrhea, nausea and vomiting.  Musculoskeletal: Negative for myalgias.  Skin: Negative for rash.  Neurological: Negative for dizziness, light-headedness and headaches.  Physical Exam Triage Vital Signs ED Triage Vitals  Enc Vitals Group     BP 01/26/18 1016 (!) 196/144     Pulse Rate 01/26/18 1016 67     Resp 01/26/18 1016 18     Temp 01/26/18 1016 98.3 F (36.8 C)     Temp Source 01/26/18 1016 Oral     SpO2 01/26/18 1016 97 %     Weight --      Height --      Head Circumference --      Peak Flow --      Pain Score 01/26/18 1020 0     Pain Loc --      Pain Edu? --      Excl. in GC? --    No data found.  Updated Vital Signs BP (!) 187/123 (BP Location:  Left Arm) Comment (BP Location): took blood pressure medicine prior to coming to [facility]  Pulse 67   Temp 98.3 F (36.8 C) (Oral)   Resp 18   SpO2 97%   Visual Acuity Right Eye Distance:  20/20 Left Eye Distance:  20/20 Bilateral Distance:  20/15  Right Eye Near:   Left Eye Near:    Bilateral Near:     Physical Exam  Constitutional: He appears well-developed and well-nourished.  HENT:  Head: Normocephalic and atraumatic.  Bilateral TMs nonerythematous, nasal mucosa nonerythematous without rhinorrhea.  Posterior oropharynx clear without erythema, no tonsillar enlargement or exudate.  Eyes: Pupils are equal, round, and reactive to light. Conjunctivae and EOM are normal.  Left conjunctiva erythematous, small amount of dried pustular discharge in medial canthus.  Neck: Neck supple.  Cardiovascular: Normal rate and regular rhythm.  No murmur heard. Pulmonary/Chest: Effort normal. No respiratory distress.  Musculoskeletal: He exhibits no edema.  Neurological: He is alert.  Skin: Skin is warm and dry.  Psychiatric: He has a normal mood and affect.  Nursing note and vitals reviewed.    UC Treatments / Results  Labs (all labs ordered are listed, but only abnormal results are displayed) Labs Reviewed - No data to display  EKG None Radiology No results found.  Procedures Procedures (including critical care time)  Medications Ordered in UC Medications - No data to display   Initial Impression / Assessment and Plan / UC Course  I have reviewed the triage vital signs and the nursing notes.  Pertinent labs & imaging results that were available during my care of the patient were reviewed by me and considered in my medical decision making (see chart for details).     Patient likely with conjunctivitis, will treat for bacterial given unilateral and discharge.  No vision changes, does not wear contacts.  Will treat with Polytrim.  Also advised cool compresses, good hand hygiene.   May return to work after 2 days.  Blood pressure significantly elevated today, patient states it does not normally run this high, is asymptomatic at this time.  Will advise patient to monitor at home, return if he develops symptoms or blood pressure persisting elevated.   Discussed strict return precautions. Patient verbalized understanding and is agreeable with plan.   Final Clinical Impressions(s) / UC Diagnoses   Final diagnoses:  Acute bacterial conjunctivitis of left eye    ED Discharge Orders        Ordered    trimethoprim-polymyxin b (POLYTRIM) ophthalmic solution  Every 4 hours     01/26/18 1047       Controlled Substance Prescriptions [place] Controlled Substance Registry  consulted? Not Applicable   Lew Dawes, [place] 01/26/18 1055

## 2018-02-04 ENCOUNTER — Inpatient Hospital Stay (HOSPITAL_COMMUNITY)
Admission: EM | Admit: 2018-02-04 | Discharge: 2018-02-09 | DRG: 280 | Disposition: A | Discharge status: Home / Self Care | Payer: BLUE CROSS/BLUE SHIELD | Attending: Internal Medicine | Admitting: Internal Medicine

## 2018-02-04 ENCOUNTER — Emergency Department (HOSPITAL_COMMUNITY): Payer: BLUE CROSS/BLUE SHIELD

## 2018-02-04 ENCOUNTER — Other Ambulatory Visit: Payer: Self-pay

## 2018-02-04 ENCOUNTER — Encounter (HOSPITAL_COMMUNITY): Payer: Self-pay | Admitting: Emergency Medicine

## 2018-02-04 DIAGNOSIS — J810 Acute pulmonary edema: Secondary | ICD-10-CM | POA: Diagnosis not present

## 2018-02-04 DIAGNOSIS — E876 Hypokalemia: Secondary | ICD-10-CM | POA: Diagnosis not present

## 2018-02-04 DIAGNOSIS — I5021 Acute systolic (congestive) heart failure: Secondary | ICD-10-CM | POA: Diagnosis not present

## 2018-02-04 DIAGNOSIS — N182 Chronic kidney disease, stage 2 (mild): Secondary | ICD-10-CM | POA: Diagnosis present

## 2018-02-04 DIAGNOSIS — G4733 Obstructive sleep apnea (adult) (pediatric): Secondary | ICD-10-CM | POA: Diagnosis not present

## 2018-02-04 DIAGNOSIS — Z8249 Family history of ischemic heart disease and other diseases of the circulatory system: Secondary | ICD-10-CM

## 2018-02-04 DIAGNOSIS — R0789 Other chest pain: Secondary | ICD-10-CM | POA: Diagnosis not present

## 2018-02-04 DIAGNOSIS — I160 Hypertensive urgency: Secondary | ICD-10-CM | POA: Diagnosis not present

## 2018-02-04 DIAGNOSIS — I130 Hypertensive heart and chronic kidney disease with heart failure and stage 1 through stage 4 chronic kidney disease, or unspecified chronic kidney disease: Principal | ICD-10-CM | POA: Diagnosis present

## 2018-02-04 DIAGNOSIS — R079 Chest pain, unspecified: Secondary | ICD-10-CM | POA: Diagnosis present

## 2018-02-04 DIAGNOSIS — I420 Dilated cardiomyopathy: Secondary | ICD-10-CM | POA: Diagnosis not present

## 2018-02-04 DIAGNOSIS — I21A1 Myocardial infarction type 2: Secondary | ICD-10-CM | POA: Diagnosis not present

## 2018-02-04 DIAGNOSIS — I214 Non-ST elevation (NSTEMI) myocardial infarction: Secondary | ICD-10-CM | POA: Diagnosis not present

## 2018-02-04 DIAGNOSIS — I429 Cardiomyopathy, unspecified: Secondary | ICD-10-CM | POA: Diagnosis not present

## 2018-02-04 DIAGNOSIS — I5041 Acute combined systolic (congestive) and diastolic (congestive) heart failure: Secondary | ICD-10-CM | POA: Diagnosis not present

## 2018-02-04 DIAGNOSIS — I340 Nonrheumatic mitral (valve) insufficiency: Secondary | ICD-10-CM | POA: Diagnosis not present

## 2018-02-04 DIAGNOSIS — Z9119 Patient's noncompliance with other medical treatment and regimen: Secondary | ICD-10-CM | POA: Diagnosis not present

## 2018-02-04 DIAGNOSIS — I161 Hypertensive emergency: Secondary | ICD-10-CM

## 2018-02-04 DIAGNOSIS — Z6837 Body mass index (BMI) 37.0-37.9, adult: Secondary | ICD-10-CM

## 2018-02-04 DIAGNOSIS — J9691 Respiratory failure, unspecified with hypoxia: Secondary | ICD-10-CM | POA: Diagnosis not present

## 2018-02-04 DIAGNOSIS — I10 Essential (primary) hypertension: Secondary | ICD-10-CM | POA: Diagnosis present

## 2018-02-04 DIAGNOSIS — E669 Obesity, unspecified: Secondary | ICD-10-CM | POA: Diagnosis not present

## 2018-02-04 DIAGNOSIS — I169 Hypertensive crisis, unspecified: Secondary | ICD-10-CM | POA: Diagnosis present

## 2018-02-04 LAB — D-DIMER, QUANTITATIVE: D-Dimer, Quant: 0.32 ug/mL-FEU (ref 0.00–0.50)

## 2018-02-04 LAB — CBC
HCT: 46.1 % (ref 39.0–52.0)
Hemoglobin: 15.2 g/dL (ref 13.0–17.0)
MCH: 27.8 pg (ref 26.0–34.0)
MCHC: 33 g/dL (ref 30.0–36.0)
MCV: 84.3 fL (ref 78.0–100.0)
PLATELETS: 226 10*3/uL (ref 150–400)
RBC: 5.47 MIL/uL (ref 4.22–5.81)
RDW: 14.9 % (ref 11.5–15.5)
WBC: 8.8 10*3/uL (ref 4.0–10.5)

## 2018-02-04 LAB — BASIC METABOLIC PANEL
Anion gap: 13 (ref 5–15)
BUN: 17 mg/dL (ref 6–20)
CALCIUM: 9.1 mg/dL (ref 8.9–10.3)
CHLORIDE: 103 mmol/L (ref 101–111)
CO2: 24 mmol/L (ref 22–32)
CREATININE: 1.5 mg/dL — AB (ref 0.61–1.24)
GFR calc non Af Amer: >60 mL/min (ref >60)
GLUCOSE: 107 mg/dL — AB (ref 65–99)
Potassium: 3 mmol/L — ABNORMAL LOW (ref 3.5–5.1)
Sodium: 140 mmol/L (ref 135–145)

## 2018-02-04 LAB — I-STAT TROPONIN, ED: TROPONIN I, POC: 0.03 ng/mL (ref 0.00–0.08)

## 2018-02-04 LAB — BRAIN NATRIURETIC PEPTIDE: B NATRIURETIC PEPTIDE 5: 1294.6 pg/mL — AB (ref 0.0–100.0)

## 2018-02-04 MED ORDER — SODIUM CHLORIDE 0.9% FLUSH
3.0000 mL | Freq: Two times a day (BID) | INTRAVENOUS | Status: DC
  Administered 2018-02-05 – 2018-02-08 (×5): 3 mL via INTRAVENOUS

## 2018-02-04 MED ORDER — HEPARIN SODIUM (PORCINE) 5000 UNIT/ML IJ SOLN
5000.0000 [IU] | Freq: Three times a day (TID) | INTRAMUSCULAR | Status: DC
  Administered 2018-02-05 – 2018-02-09 (×12): 5000 [IU] via SUBCUTANEOUS
  Filled 2018-02-04 (×13): qty 1

## 2018-02-04 MED ORDER — POTASSIUM CHLORIDE 10 MEQ/100ML IV SOLN
10.0000 meq | Freq: Once | INTRAVENOUS | Status: AC
  Administered 2018-02-05: 10 meq via INTRAVENOUS
  Filled 2018-02-04: qty 100

## 2018-02-04 MED ORDER — FUROSEMIDE 10 MG/ML IJ SOLN
40.0000 mg | Freq: Two times a day (BID) | INTRAMUSCULAR | Status: DC
  Administered 2018-02-04: 40 mg via INTRAVENOUS
  Filled 2018-02-04: qty 4

## 2018-02-04 MED ORDER — HYDROCHLOROTHIAZIDE 25 MG PO TABS: 25.0000 mg | ORAL_TABLET | Freq: Every day | ORAL | Status: DC

## 2018-02-04 MED ORDER — SODIUM CHLORIDE 0.9 % IV SOLN: 250.0000 mL | INTRAVENOUS | Status: DC | PRN

## 2018-02-04 MED ORDER — SODIUM CHLORIDE 0.9% FLUSH: 3.0000 mL | INTRAVENOUS | Status: DC | PRN

## 2018-02-04 MED ORDER — POTASSIUM CHLORIDE CRYS ER 20 MEQ PO TBCR
40.0000 meq | EXTENDED_RELEASE_TABLET | Freq: Once | ORAL | Status: AC
  Administered 2018-02-04: 40 meq via ORAL
  Filled 2018-02-04: qty 2

## 2018-02-04 MED ORDER — ONDANSETRON HCL 4 MG/2ML IJ SOLN: 4.0000 mg | Freq: Four times a day (QID) | INTRAMUSCULAR | Status: DC | PRN

## 2018-02-04 MED ORDER — METOPROLOL SUCCINATE ER 50 MG PO TB24
50.0000 mg | ORAL_TABLET | Freq: Every day | ORAL | Status: DC
  Administered 2018-02-05: 50 mg via ORAL
  Filled 2018-02-04 (×2): qty 1

## 2018-02-04 MED ORDER — HYDRALAZINE HCL 20 MG/ML IJ SOLN
10.0000 mg | Freq: Once | INTRAMUSCULAR | Status: AC
  Administered 2018-02-04: 10 mg via INTRAVENOUS
  Filled 2018-02-04: qty 1

## 2018-02-04 MED ORDER — HYDRALAZINE HCL 20 MG/ML IJ SOLN
10.0000 mg | INTRAMUSCULAR | Status: DC | PRN
  Administered 2018-02-05 – 2018-02-09 (×7): 10 mg via INTRAVENOUS
  Filled 2018-02-04 (×7): qty 1

## 2018-02-04 MED ORDER — NITROGLYCERIN IN D5W 200-5 MCG/ML-% IV SOLN
0.0000 ug/min | Freq: Once | INTRAVENOUS | Status: AC
  Administered 2018-02-05: 120 ug/min via INTRAVENOUS

## 2018-02-04 MED ORDER — ZOLPIDEM TARTRATE 5 MG PO TABS: 5.0000 mg | ORAL_TABLET | Freq: Every evening | ORAL | Status: DC | PRN

## 2018-02-04 MED ORDER — AMLODIPINE BESYLATE 10 MG PO TABS
10.0000 mg | ORAL_TABLET | Freq: Every day | ORAL | Status: DC
  Administered 2018-02-05 – 2018-02-07 (×3): 10 mg via ORAL
  Filled 2018-02-04 (×3): qty 1

## 2018-02-04 MED ORDER — ACETAMINOPHEN 325 MG PO TABS
650.0000 mg | ORAL_TABLET | ORAL | Status: DC | PRN
  Administered 2018-02-05 – 2018-02-09 (×3): 650 mg via ORAL
  Filled 2018-02-04 (×3): qty 2

## 2018-02-04 MED ORDER — NITROGLYCERIN IN D5W 200-5 MCG/ML-% IV SOLN
0.0000 ug/min | Freq: Once | INTRAVENOUS | Status: AC
  Administered 2018-02-04: 30 ug/min via INTRAVENOUS
  Filled 2018-02-04: qty 250

## 2018-02-04 NOTE — ED Provider Notes (Signed)
[facility] EMERGENCY DEPARTMENT Provider Note  CSN: [id number] Arrival date & time: 02/04/18 1652  Chief Complaint(s) Chest Pain  HPI Steven Chase is a 32 y.o. male w/ h/o HTN.  The history is provided by the patient.  Chest Pain   This is a new problem. The current episode started 3 to 5 hours ago. The problem occurs constantly. Progression since onset: gradually improve. The pain is present in the substernal region. The pain is at a severity of 6/10. The quality of the pain is described as pressure-like. The pain does not radiate. Associated symptoms include abdominal pain and lower extremity edema. Pertinent negatives include no cough, no fever, no leg pain, no nausea, no shortness of breath and no vomiting.  His past medical history is significant for hypertension.    Past Medical History Past Medical History:  Diagnosis Date  . Hypertension   . Obesity   . OSA (obstructive sleep apnea) 11/05/2014   02/2015 -severe -AHI 106/h    Patient Active Problem List   Diagnosis Date Noted  . Allergic rhinitis 01/21/2018  . Hyperglycemia 12/02/2017  . Hypokalemia 12/02/2017  . Cough 12/02/2017  . Patellar tendonitis of left knee 12/02/2017  . Encounter for well adult exam with abnormal findings 12/10/2016  . Obesity 05/25/2016  . Lower leg edema 12/12/2014  . Essential hypertension 11/05/2014  . OSA (obstructive sleep apnea) 11/05/2014   Home Medication(s) Prior to Admission medications   Medication Sig Start Date End Date Taking? Authorizing Provider  amLODipine (NORVASC) 10 MG tablet Take 1 tablet (10 mg total) by mouth daily. 02/12/17   Veryl Speak, FNP  hydrochlorothiazide (HYDRODIURIL) 25 MG tablet Take 1 tablet (25 mg total) by mouth daily. 12/02/17   Corwin Levins, MD  metoprolol succinate (TOPROL-XL) 50 MG 24 hr tablet Take 1 tablet (50 mg total) by mouth daily. Take with or immediately following a meal. 01/26/17   Veryl Speak, FNP  naproxen  (NAPROSYN) 500 MG tablet Take 1 tablet (500 mg total) by mouth 2 (two) times daily with a meal. 12/02/17   Corwin Levins, MD  triamcinolone (NASACORT) 55 MCG/ACT AERO nasal inhaler Place 2 sprays into the nose daily. 01/21/18   Corwin Levins, MD                                                                                                                                    Past Surgical History Past Surgical History:  Procedure Laterality Date  . HERNIA REPAIR     Family History Family History  Problem Relation Age of Onset  . Hypertension Mother   . Heart disease Maternal Grandfather   . Hypertension Maternal Grandfather   . Hypertension Sister     Social History Social History   Tobacco Use  . Smoking status: Never Smoker  . Smokeless tobacco: Never Used  Substance Use Topics  . Alcohol use:  No  . Drug use: No   Allergies Patient has no known allergies.  Review of Systems Review of Systems  Constitutional: Negative for fever.  Respiratory: Negative for cough and shortness of breath.   Cardiovascular: Positive for chest pain.  Gastrointestinal: Positive for abdominal pain. Negative for nausea and vomiting.   All other systems are reviewed and are negative for acute change except as noted in the HPI  Physical Exam Vital Signs  I have reviewed the triage vital signs BP (!) 180/131   Pulse 97   Temp 99.3 F (37.4 C) (Oral)   Resp 16   Ht 5\' 6"  (1.676 m)   Wt 104.3 kg (230 lb)   SpO2 90%   BMI 37.12 kg/m   Physical Exam  Constitutional: He is oriented to person, place, and time. He appears well-developed and well-nourished. No distress.  HENT:  Head: Normocephalic and atraumatic.  Nose: Nose normal.  Eyes: Pupils are equal, round, and reactive to light. Conjunctivae and EOM are normal. Right eye exhibits no discharge. Left eye exhibits no discharge. No scleral icterus.  Neck: Normal range of motion. Neck supple.  Cardiovascular: Normal rate and regular rhythm.  Exam reveals no gallop and no friction rub.  No murmur heard. Pulmonary/Chest: Effort normal and breath sounds normal. No stridor. No respiratory distress. He has no rales.  Abdominal: Soft. He exhibits no distension. There is no tenderness.  Musculoskeletal: He exhibits no edema or tenderness.  1+ BLE edema  Neurological: He is alert and oriented to person, place, and time.  Skin: Skin is warm and dry. No rash noted. He is not diaphoretic. No erythema.  Psychiatric: He has a normal mood and affect.  Vitals reviewed.   ED Results and Treatments Labs (all labs ordered are listed, but only abnormal results are displayed) Labs Reviewed  BASIC METABOLIC PANEL - Abnormal; Notable for the following components:      Result Value   Potassium 3.0 (*)    Glucose, Bld 107 (*)    Creatinine, Ser 1.50 (*)    All other components within normal limits  BRAIN NATRIURETIC PEPTIDE - Abnormal; Notable for the following components:   B Natriuretic Peptide 1,294.6 (*)    All other components within normal limits  CBC  D-DIMER, QUANTITATIVE (NOT AT [facility])  I-STAT TROPONIN, ED                                                                                                                         EKG  EKG Interpretation  Date/Time:  Friday February 04 2018 21:27:28 EDT Ventricular Rate:  97 PR Interval:    QRS Duration: 115 QT Interval:  383 QTC Calculation: 487 R Axis:   -13 Text Interpretation:  Age not entered, assumed to be  32 years old for purpose of ECG interpretation Sinus rhythm LAE, consider biatrial enlargement Left ventricular hypertrophy Abnormal T, consider ischemia, lateral leads Otherwise no significant change Confirmed by Drema Pry [phone]) on 02/04/2018  9:37:21 PM      Radiology Dg Chest 2 View  Result Date: 02/04/2018 CLINICAL DATA:  Sudden onset chest pain. EXAM: CHEST - 2 VIEW COMPARISON:  Chest x-ray dated September 12, 2017. FINDINGS: Stable cardiomegaly. Increased pulmonary  vascular congestion and interstitial edema. Patchy atelectasis at the lung bases. No pleural effusion or pneumothorax. No acute osseous abnormality. IMPRESSION: 1. Increased vascular congestion and pulmonary interstitial edema. Electronically Signed   By: Obie Dredge M.D.   On: 02/04/2018 17:38   Pertinent labs & imaging results that were available during my care of the patient were reviewed by me and considered in my medical decision making (see chart for details).  Medications Ordered in ED Medications  nitroGLYCERIN 50 mg in dextrose 5 % 250 mL (0.2 mg/mL) infusion (has no administration in time range)  potassium chloride SA (K-DUR,KLOR-CON) CR tablet 40 mEq (40 mEq Oral Given 02/04/18 2035)  hydrALAZINE (APRESOLINE) injection 10 mg (10 mg Intravenous Given 02/04/18 2116)                                                                                                                                    Procedures Procedures CRITICAL CARE Performed by: Amadeo Garnet Cardama Total critical care time: 35 minutes Critical care time was exclusive of separately billable procedures and treating other patients. Critical care was necessary to treat or prevent imminent or life-threatening deterioration. Critical care was time spent personally by me on the following activities: development of treatment plan with patient and/or surrogate as well as nursing, discussions with consultants, evaluation of patient's response to treatment, examination of patient, obtaining history from patient or surrogate, ordering and performing treatments and interventions, ordering and review of laboratory studies, ordering and review of radiographic studies, pulse oximetry and re-evaluation of patient's condition.   (including critical care time)  Medical Decision Making / ED Course I have reviewed the nursing notes for this encounter and the patient's prior records (if available in EHR or on provided  paperwork).  Clinical Course as of Feb 05 2304  Fri Feb 04, 2018  2258 Presentation consistent with hypertensive emergency with HF confirmed with elevated BNP and pulmonary edema. Placed on NTG gtt. Will admit to medicine for further work up and management.   [PC]    Clinical Course User Index [PC] Cardama, Amadeo Garnet, MD     Final Clinical Impression(s) / ED Diagnoses Final diagnoses:  Hypertensive emergency      This chart was dictated using voice recognition software.  Despite best efforts to proofread,  errors can occur which can change the documentation meaning.   Nira Conn, MD 02/04/18 2306

## 2018-02-04 NOTE — ED Notes (Signed)
EKG done in triage, did not cross over into MUSE. Hardcopy available.

## 2018-02-04 NOTE — H&P (Signed)
History and Physical    Steven Chase ZOX:[id number] DOB: January 03, 1986 DOA: 02/04/2018  PCP: Veryl Speak, FNP   Patient coming from: Home  Chief Complaint: Chest pain, SOB   HPI: Steven Chase is a 32 y.o. male with medical history significant for hypertension and OSA, now presenting to the emergency department for evaluation of chest pain and shortness of breath.  Patient reports that he has had some swelling of the bilateral lower extremities recently that he attributed to his socks being too tight, but was otherwise well until developing chest pain and dyspnea at work today.  Patient reports that he was exerting himself at work when he developed a sharp pain in the left chest and shortness of breath.  He denies any significant cough and denies recent fevers or chills.  He has never experienced chest pain previously.  He was short of breath with this, but denies nausea or diaphoresis.  He asked his coworker to call 911 and he was treated with 324 mg of aspirin and nitroglycerin x2 prior to arrival in the ED.  ED Course: Upon arrival to the ED, patient is found to be afebrile, saturating well on room air, hypertensive to 190/155, and slightly tachypneic.  EKG features a sinus rhythm with LVH by voltage criteria and lateral T wave abnormality.  Chest x-ray is notable for increased vascular congestion and interstitial pulmonary edema.  Chemistry panel is notable for potassium 3.0 and creatinine of 1.50, up from 1.241-year ago.  CBC is unremarkable, d-dimer is normal, troponin is within normal limits, and BNP is elevated to 1295.  Patient was given 40 mEq oral potassium, 10 mg IV hydralazine, and started on nitroglycerin infusion in the ED.  Chest pain has resolved and blood pressure has improved only slightly.  He will be admitted to the stepdown unit for ongoing evaluation and management of hypertensive crisis with chest pain and pulmonary edema.  Review of Systems:  All other systems reviewed  and apart from HPI, are negative.  Past Medical History:  Diagnosis Date  . Hypertension   . Obesity   . OSA (obstructive sleep apnea) 11/05/2014   02/2015 -severe -AHI 106/h     Past Surgical History:  Procedure Laterality Date  . HERNIA REPAIR       reports that he has never smoked. He has never used smokeless tobacco. He reports that he does not drink alcohol or use drugs.  No Known Allergies  Family History  Problem Relation Age of Onset  . Hypertension Mother   . Heart disease Maternal Grandfather   . Hypertension Maternal Grandfather   . Hypertension Sister      Prior to Admission medications   Medication Sig Start Date End Date Taking? Authorizing Provider  amLODipine (NORVASC) 10 MG tablet Take 1 tablet (10 mg total) by mouth daily. 02/12/17  Yes Veryl Speak, FNP  hydrochlorothiazide (HYDRODIURIL) 25 MG tablet Take 1 tablet (25 mg total) by mouth daily. 12/02/17  Yes Corwin Levins, MD  metoprolol succinate (TOPROL-XL) 50 MG 24 hr tablet Take 1 tablet (50 mg total) by mouth daily. Take with or immediately following a meal. 01/26/17  Yes Veryl Speak, FNP    Physical Exam: Vitals:   02/04/18 1701 02/04/18 2030 02/04/18 2037 02/04/18 2230  BP: (!) 160/120 (!) 187/150 (!) 190/155 (!) 180/131  Pulse: 99 91  97  Resp: 16 13 15 16   Temp: 99.3 F (37.4 C)     TempSrc: Oral  SpO2: 95% 95%  90%  Weight:      Height:          Constitutional: NAD, calm  Eyes: PERTLA, lids and conjunctivae normal ENMT: Mucous membranes are moist. Posterior pharynx clear of any exudate or lesions.   Neck: normal, supple, no masses, no thyromegaly Respiratory: Mild tachypnea and dyspnea with speech. Fine rales bilaterally. No accessory muscle use.     Cardiovascular: S1 & S2 heard, regular rate and rhythm. 1+ pretibial edema bilaterally. Abdomen: No distension, no tenderness, soft. Bowel sounds normal.  Musculoskeletal: no clubbing / cyanosis. No joint deformity upper and  lower extremities.   Skin: no significant rashes, lesions, ulcers. Warm, dry, well-perfused. Neurologic: CN 2-12 grossly intact. Sensation intact. Strength 5/5 in all 4 limbs.  Psychiatric: Alert and oriented x 3. Calm, cooperative.     Labs on Admission: I have personally reviewed following labs and imaging studies  CBC: Recent Labs  Lab 02/04/18 1707  WBC 8.8  HGB 15.2  HCT 46.1  MCV 84.3  PLT 226   Basic Metabolic Panel: Recent Labs  Lab 02/04/18 1707  NA 140  K 3.0*  CL 103  CO2 24  GLUCOSE 107*  BUN 17  CREATININE 1.50*  CALCIUM 9.1   GFR: Estimated Creatinine Clearance: 80 mL/min (A) (by C-G formula based on SCr of 1.5 mg/dL (H)). Liver Function Tests: No results for input(s): AST, ALT, ALKPHOS, BILITOT, PROT, ALBUMIN in the last 168 hours. No results for input(s): LIPASE, AMYLASE in the last 168 hours. No results for input(s): AMMONIA in the last 168 hours. Coagulation Profile: No results for input(s): INR, PROTIME in the last 168 hours. Cardiac Enzymes: No results for input(s): CKTOTAL, CKMB, CKMBINDEX, TROPONINI in the last 168 hours. BNP (last 3 results) No results for input(s): PROBNP in the last 8760 hours. HbA1C: No results for input(s): HGBA1C in the last 72 hours. CBG: No results for input(s): GLUCAP in the last 168 hours. Lipid Profile: No results for input(s): CHOL, HDL, LDLCALC, TRIG, CHOLHDL, LDLDIRECT in the last 72 hours. Thyroid Function Tests: No results for input(s): TSH, T4TOTAL, FREET4, T3FREE, THYROIDAB in the last 72 hours. Anemia Panel: No results for input(s): VITAMINB12, FOLATE, FERRITIN, TIBC, IRON, RETICCTPCT in the last 72 hours. Urine analysis:    Component Value Date/Time   COLORURINE YELLOW 07/01/2016 1157   APPEARANCEUR CLOUDY (A) 07/01/2016 1157   LABSPEC >=1.030 01/12/2018 1038   PHURINE 6.0 01/12/2018 1038   GLUCOSEU NEGATIVE 01/12/2018 1038   HGBUR MODERATE (A) 01/12/2018 1038   BILIRUBINUR NEGATIVE 01/12/2018  1038   KETONESUR NEGATIVE 01/12/2018 1038   PROTEINUR >=300 (A) 01/12/2018 1038   UROBILINOGEN 0.2 01/12/2018 1038   NITRITE NEGATIVE 01/12/2018 1038   LEUKOCYTESUR NEGATIVE 01/12/2018 1038   Sepsis Labs: @LABRCNTIP (procalcitonin:4,lacticidven:4) )No results found for this or any previous visit (from the past 240 hour(s)).   Radiological Exams on Admission: Dg Chest 2 View  Result Date: 02/04/2018 CLINICAL DATA:  Sudden onset chest pain. EXAM: CHEST - 2 VIEW COMPARISON:  Chest x-ray dated September 12, 2017. FINDINGS: Stable cardiomegaly. Increased pulmonary vascular congestion and interstitial edema. Patchy atelectasis at the lung bases. No pleural effusion or pneumothorax. No acute osseous abnormality. IMPRESSION: 1. Increased vascular congestion and pulmonary interstitial edema. Electronically Signed   By: Obie Dredge M.D.   On: 02/04/2018 17:38    EKG: Independently reviewed. Sinus rhythm, LVH, T-wave abnormality in lateral leads.   Assessment/Plan  1. Acute pulmonary edema  - Presents with chest  pain and SOB, noted to have peripheral edema and edema on CXR  - Suspect acute CHF, possibly secondary to uncontrolled HTN  - Started on NTG infusion in ED  - Diurese with Lasix 40 mg IV q12h, continue beta-blocker, check echocardiogram, follow daily wt and I/O's    2. Hypertension with hypertensive urgency  - BP 190/155 in ED  - Has hx of HTN on Toprol, Norvasc, and HCTZ  - Volume overload likely contributing  - Started on NTG infusion in ED   - Continue Toprol, Norvasc, and HCTZ, titrate NTG infusion, diuresis with IV Lasix   3. Chest pain  - Presents after an episode of acute chest pain at work  - Treated by EMS with 324 mg ASA and NTG x2 prior to arrival in ED  - Started on NTG infusion in ED with resolution of pain  - EKG with lateral T-wave abnormalities  - Initial troponin normal   - D-dimer negative  - CXR with vascular congestion and interstitial edema   - Continue  cardiac monitoring, obtain serial troponin measurements, continue beta-blocker   4. Hypokalemia  - Serum potassium is 3.0 on admission  - Treated in ED with 40 mEq oral potassium and given 10 mEq IV potassium  - Continue cardiac monitoring, repeat chem panel in am    5. CKD stage II  - SCr is 1.50 on admission, up from 1.24 one year ago  - Follow daily chem panel during diuresis    6. OSA  - Continue CPAP qHS    DVT prophylaxis: Lovenox Code Status: Full  Family Communication: Discussed with patient Consults called: None Admission status: Inpatient    Briscoe Deutscher, MD [facility] Pager [phone]  If 7PM-7AM, please contact night-coverage [email or web address] Password [facility]  02/04/2018, 11:33 PM

## 2018-02-04 NOTE — ED Provider Notes (Signed)
Patient placed in Quick Look pathway, seen and evaluated   Chief Complaint: Chest pain  HPI:   Patient presents via EMS with sudden onset chest pain while at work.  ROS: Reports cough, shortness of breath, denies any nausea vomiting.  Physical Exam:   Gen: No distress  Neuro: Awake and Alert  Skin: Warm    Focused Exam: Afebrile, nontoxic, tachycardic with SPO2 dropping to 94% and fluctuating between 94 and 98%.  Lungs are clear to auscultation bilaterally.   Initiation of care has begun. The patient has been counseled on the process, plan, and necessity for staying for the completion/evaluation, and the remainder of the medical screening examination    Gregary Cromer 02/04/18 1714    Arby Barrette, MD 02/06/18 2123

## 2018-02-04 NOTE — ED Triage Notes (Signed)
Pt carrying a bag at work and had sudden onset of CP. EMS gave 2 nitro and 324 mg ASA. 3/10 pain decreased to 1/10 pain. BP initially 191 systolic, decreased to 186 after nitro. 96% on room air. HR 98

## 2018-02-05 ENCOUNTER — Inpatient Hospital Stay (HOSPITAL_COMMUNITY): Payer: BLUE CROSS/BLUE SHIELD

## 2018-02-05 DIAGNOSIS — R0789 Other chest pain: Secondary | ICD-10-CM

## 2018-02-05 DIAGNOSIS — I161 Hypertensive emergency: Secondary | ICD-10-CM

## 2018-02-05 DIAGNOSIS — I340 Nonrheumatic mitral (valve) insufficiency: Secondary | ICD-10-CM

## 2018-02-05 DIAGNOSIS — I10 Essential (primary) hypertension: Secondary | ICD-10-CM

## 2018-02-05 DIAGNOSIS — J810 Acute pulmonary edema: Secondary | ICD-10-CM

## 2018-02-05 LAB — RAPID URINE DRUG SCREEN, HOSP PERFORMED
Amphetamines: NOT DETECTED
BARBITURATES: NOT DETECTED
BENZODIAZEPINES: NOT DETECTED
Cocaine: NOT DETECTED
Opiates: NOT DETECTED
Tetrahydrocannabinol: NOT DETECTED

## 2018-02-05 LAB — BASIC METABOLIC PANEL
Anion gap: 18 — ABNORMAL HIGH (ref 5–15)
BUN: 13 mg/dL (ref 6–20)
CO2: 22 mmol/L (ref 22–32)
CREATININE: 1.4 mg/dL — AB (ref 0.61–1.24)
Calcium: 9 mg/dL (ref 8.9–10.3)
Chloride: 100 mmol/L — ABNORMAL LOW (ref 101–111)
GFR calc non Af Amer: >60 mL/min (ref >60)
Glucose, Bld: 116 mg/dL — ABNORMAL HIGH (ref 65–99)
POTASSIUM: 2.9 mmol/L — AB (ref 3.5–5.1)
Sodium: 140 mmol/L (ref 135–145)

## 2018-02-05 LAB — MAGNESIUM: MAGNESIUM: 1.9 mg/dL (ref 1.7–2.4)

## 2018-02-05 LAB — ECHOCARDIOGRAM COMPLETE
HEIGHTINCHES: 66 in
Weight: 3566.4 oz

## 2018-02-05 LAB — TROPONIN I
TROPONIN I: 0.06 ng/mL — AB (ref <0.03)
Troponin I: 0.09 ng/mL (ref <0.03)
Troponin I: 0.5 ng/mL (ref <0.03)

## 2018-02-05 LAB — MRSA PCR SCREENING: MRSA BY PCR: NEGATIVE

## 2018-02-05 MED ORDER — FUROSEMIDE 10 MG/ML IJ SOLN
40.0000 mg | Freq: Two times a day (BID) | INTRAMUSCULAR | Status: DC
  Administered 2018-02-05 – 2018-02-07 (×5): 40 mg via INTRAVENOUS
  Filled 2018-02-05 (×5): qty 4

## 2018-02-05 MED ORDER — MAGNESIUM OXIDE 400 (241.3 MG) MG PO TABS
400.0000 mg | ORAL_TABLET | Freq: Once | ORAL | Status: AC
  Administered 2018-02-05: 400 mg via ORAL
  Filled 2018-02-05: qty 1

## 2018-02-05 MED ORDER — METOPROLOL SUCCINATE ER 100 MG PO TB24
100.0000 mg | ORAL_TABLET | Freq: Every day | ORAL | Status: DC
  Administered 2018-02-06: 100 mg via ORAL
  Filled 2018-02-05: qty 1

## 2018-02-05 MED ORDER — POTASSIUM CHLORIDE 10 MEQ/100ML IV SOLN
10.0000 meq | Freq: Once | INTRAVENOUS | Status: AC
  Administered 2018-02-05: 10 meq via INTRAVENOUS
  Filled 2018-02-05: qty 100

## 2018-02-05 MED ORDER — METOPROLOL SUCCINATE ER 50 MG PO TB24
50.0000 mg | ORAL_TABLET | Freq: Every day | ORAL | Status: AC
  Administered 2018-02-05: 50 mg via ORAL
  Filled 2018-02-05: qty 1

## 2018-02-05 MED ORDER — POTASSIUM CHLORIDE CRYS ER 20 MEQ PO TBCR
40.0000 meq | EXTENDED_RELEASE_TABLET | ORAL | Status: AC
  Administered 2018-02-05 (×2): 40 meq via ORAL
  Filled 2018-02-05 (×2): qty 2

## 2018-02-05 MED ORDER — NITROGLYCERIN IN D5W 200-5 MCG/ML-% IV SOLN
0.0000 ug/min | Freq: Once | INTRAVENOUS | Status: AC
  Administered 2018-02-05: 200 ug/min via INTRAVENOUS
  Filled 2018-02-05: qty 250

## 2018-02-05 NOTE — Progress Notes (Signed)
Dr. Elease Hashimoto stated to turn off IV ntg. Cont to monitor. Emelda Brothers RN

## 2018-02-05 NOTE — Progress Notes (Signed)
PROGRESS NOTE  Steven SANDBORN OZD:[id number] DOB: 1986-06-18 DOA: 02/04/2018 PCP: Veryl Speak, FNP   LOS: 1 day   Brief Narrative / Interim history: 32 year old male with hypertension, sleep apnea, obesity, who came to the hospital on 4/5 due to shortness of breath and chest pain of rather sudden onset while being at work.  He also has noticed progressive lower extremity swelling over the last few days to weeks.  On arrival to ED he was found to be in hypertensive urgency, he also had significant fluid overload on chest x-ray, d-dimer was normal, troponin was normal and BNP was elevated.  He was started on nitroglycerin infusion, Lasix and was admitted to stepdown  Assessment & Plan: Principal Problem:   Acute pulmonary edema (HCC) Active Problems:   Essential hypertension   OSA (obstructive sleep apnea)   Hypokalemia   Hypertensive crisis   Chest pain   CKD (chronic kidney disease), stage II   Acute pulmonary edema with hypoxic respiratory failure -Patient feels a little bit better this morning, he is on room air, he is on nitroglycerin infusion and is continued to be diuresed -2D echo pending -Cardiology is following -Follow strict I's and O's, daily weights  Hypertension with hypertensive urgency -Continue Toprol, Norvasc, hold hydrochlorothiazide as he is on Lasix -Attempt to wean off nitroglycerin  Chest pain -Improved with nitroglycerin, also received aspirin -Initial troponin normal, slightly elevated 0.06 on repeat, if troponin continues to rise may need a cardiac catheterization per cardiology  Hypokalemia -Continue to replete and monitor while getting diuresed  Chronic kidney disease stage II -Creatinine 1.5 on admission from 1.2 last year, closely monitor with diuresis  Obstructive sleep apnea -Continue CPAP  Obesity -Discussed with patient about dietary changes, he will need also to follow a low-salt diet to which he is a non-adherent right now   DVT  prophylaxis: Lovenox Code Status: Full code Family Communication: no family at bedside Disposition Plan: remain in SDU   Consultants:   Cardiology   Procedures:   2D echo: pending   Antimicrobials:  None    Subjective: -Feeling a little bit better, no longer has chest pain however complains of a mild headache.  No abdominal pain, nausea or vomiting  Objective: Vitals:   02/05/18 0612 02/05/18 0618 02/05/18 0928 02/05/18 1005  BP:  (!) 153/102 (!) 168/108 (!) 150/111  Pulse:   93 94  Resp: 17   20  Temp:    (!) 97.4 F (36.3 C)  TempSrc:    Oral  SpO2:    95%  Weight:      Height:        Intake/Output Summary (Last 24 hours) at 02/05/2018 1250 Last data filed at 02/05/2018 1214 Gross per 24 hour  Intake 240 ml  Output 2600 ml  Net -2360 ml   Filed Weights   02/04/18 1700 02/05/18 0603  Weight: 104.3 kg (230 lb) 101.1 kg (222 lb 14.4 oz)    Examination:  Constitutional: NAD, sitting at the edge of the bed, slightly tachypneic Eyes: PERRL, lids and conjunctivae normal ENMT: Mucous membranes are moist.  Neck: normal, supple Respiratory: Faint bibasilar crackles, no wheezing. Normal respiratory effort. No accessory muscle use.  Cardiovascular: Regular rate and rhythm, no murmurs / rubs / gallops.  1-2+ pitting LE edema. 2+ pedal pulses.  Abdomen: no tenderness. Bowel sounds positive.   Skin: no rashes Neurologic: CN 2-12 grossly intact. Strength 5/5 in all 4.  Psychiatric: Alert and oriented x 3.  Data Reviewed: I have independently reviewed following labs and imaging studies   CXR -evidence for fluid overload EKG -sinus rhythm, LVH, T wave inversion in lateral leads  CBC: Recent Labs  Lab 02/04/18 1707  WBC 8.8  HGB 15.2  HCT 46.1  MCV 84.3  PLT 226   Basic Metabolic Panel: Recent Labs  Lab 02/04/18 1707  NA 140  K 3.0*  CL 103  CO2 24  GLUCOSE 107*  BUN 17  CREATININE 1.50*  CALCIUM 9.1   GFR: Estimated Creatinine Clearance: 78.7  mL/min (A) (by C-G formula based on SCr of 1.5 mg/dL (H)). Liver Function Tests: No results for input(s): AST, ALT, ALKPHOS, BILITOT, PROT, ALBUMIN in the last 168 hours. No results for input(s): LIPASE, AMYLASE in the last 168 hours. No results for input(s): AMMONIA in the last 168 hours. Coagulation Profile: No results for input(s): INR, PROTIME in the last 168 hours. Cardiac Enzymes: Recent Labs  Lab 02/04/18 2341 02/05/18 1034  TROPONINI 0.06* 0.09*   BNP (last 3 results) No results for input(s): PROBNP in the last 8760 hours. HbA1C: No results for input(s): HGBA1C in the last 72 hours. CBG: No results for input(s): GLUCAP in the last 168 hours. Lipid Profile: No results for input(s): CHOL, HDL, LDLCALC, TRIG, CHOLHDL, LDLDIRECT in the last 72 hours. Thyroid Function Tests: No results for input(s): TSH, T4TOTAL, FREET4, T3FREE, THYROIDAB in the last 72 hours. Anemia Panel: No results for input(s): VITAMINB12, FOLATE, FERRITIN, TIBC, IRON, RETICCTPCT in the last 72 hours. Urine analysis:    Component Value Date/Time   COLORURINE YELLOW 07/01/2016 1157   APPEARANCEUR CLOUDY (A) 07/01/2016 1157   LABSPEC >=1.030 01/12/2018 1038   PHURINE 6.0 01/12/2018 1038   GLUCOSEU NEGATIVE 01/12/2018 1038   HGBUR MODERATE (A) 01/12/2018 1038   BILIRUBINUR NEGATIVE 01/12/2018 1038   KETONESUR NEGATIVE 01/12/2018 1038   PROTEINUR >=300 (A) 01/12/2018 1038   UROBILINOGEN 0.2 01/12/2018 1038   NITRITE NEGATIVE 01/12/2018 1038   LEUKOCYTESUR NEGATIVE 01/12/2018 1038   Sepsis Labs: Invalid input(s): PROCALCITONIN, LACTICIDVEN  Recent Results (from the past 240 hour(s))  MRSA PCR Screening     Status: None   Collection Time: 02/05/18  6:22 AM  Result Value Ref Range Status   MRSA by PCR NEGATIVE NEGATIVE Final    Comment:        The GeneXpert MRSA Assay (FDA approved for NASAL specimens only), is one component of a comprehensive MRSA colonization surveillance program. It is  not intended to diagnose MRSA infection nor to guide or monitor treatment for MRSA infections. Performed at [facility], [address], [place], [place] [address]       Radiology Studies: Dg Chest 2 View  Result Date: 02/04/2018 CLINICAL DATA:  Sudden onset chest pain. EXAM: CHEST - 2 VIEW COMPARISON:  Chest x-ray dated September 12, 2017. FINDINGS: Stable cardiomegaly. Increased pulmonary vascular congestion and interstitial edema. Patchy atelectasis at the lung bases. No pleural effusion or pneumothorax. No acute osseous abnormality. IMPRESSION: 1. Increased vascular congestion and pulmonary interstitial edema. Electronically Signed   By: Obie Dredge M.D.   On: 02/04/2018 17:38     Scheduled Meds: . amLODipine  10 mg Oral Daily  . furosemide  40 mg Intravenous BID  . heparin  5,000 Units Subcutaneous Q8H  . [START ON 02/06/2018] metoprolol succinate  100 mg Oral Daily  . sodium chloride flush  3 mL Intravenous Q12H   Continuous Infusions: . sodium chloride  Pamella Pert, MD, PhD [facility] Pager [phone]  If 7PM-7AM, please contact night-coverage [email or web address] Password [email or web address] 02/05/2018, 12:50 PM

## 2018-02-05 NOTE — Progress Notes (Signed)
   Echocardiogram results as follows: Study Conclusions  - Left ventricle: The cavity size was moderately dilated. Wall   thickness was increased in a pattern of severe LVH. Systolic   function was severely reduced. The estimated ejection fraction   was 15%. Diffuse hypokinesis. Features are consistent with a   pseudonormal left ventricular filling pattern, with concomitant   abnormal relaxation and increased filling pressure (grade 2   diastolic dysfunction). Doppler parameters are consistent with   high ventricular filling pressure. - Aortic valve: There was trivial regurgitation. - Mitral valve: There was mild regurgitation. - Left atrium: The atrium was mildly dilated. - Pulmonary arteries: Systolic pressure was mildly increased. PA   peak pressure: 38 mm Hg (S). - Pericardium, extracardiac: A small pericardial effusion was   identified.  Impressions:  - Severe global reduction in LV systolic function; moderate   diastolic dysfunction with elevated LV filling pressure; severe   LVH; moderate LVE; trace AI; mild MR; mild LAE; trace TR with   mildly elevated pulmonary pressure; small pericardial effusion.    Discussed severe LV dysfunction with patient and his mother. Differential causes include HTN mediated, myocarditis (patient with cold symptoms 2-3 weeks ago), Thyroid dysfunction (TSH ordered for AM), vs ischemic cardiomyopathy. Anticipate patient may undergo a R/LHC prior to discharge.  - Continue to monitor closely on telemetry.  - Aggressive repletion of electrolytes to minimize risk of arrhythmias - will replete K IV + 80 mEq po; and Mg oxide 400mg  x1.  - Metoprolol increased to 100mg  today.  - Determination for cardiac cath per Dr. Elease Hashimoto in AM.    Judy Pimple, PA-C

## 2018-02-05 NOTE — Progress Notes (Signed)
  Echocardiogram 2D Echocardiogram has been performed.  Ameri Cahoon T Ladona Rosten 02/05/2018, 11:40 AM

## 2018-02-05 NOTE — Progress Notes (Addendum)
Paged cardiology about elevated BP (169/109) PRN hydralazine not due for another half hour and also notified again of troponin 0.50

## 2018-02-05 NOTE — Progress Notes (Signed)
Paient setup for BiPAP and fitted mask for patient comfort. He isn't ready to wear at this time and will call when he is ready to place on mask if he needs any assistance. RN aware

## 2018-02-05 NOTE — Progress Notes (Signed)
Paged Cards fellow to make aware of trop 0.50 and BP still elevated. Cont to monitor. Emelda Brothers RN

## 2018-02-05 NOTE — Consult Note (Addendum)
Cardiology Consultation:   Patient ID: Steven Chase; [id number]; Jun 04, 1986   Admit date: 02/04/2018 Date of Consult: 02/05/2018  Primary Care Provider: Veryl Speak, FNP Primary Cardiologist: New to [facility] Primary Electrophysiologist:  None   Patient Profile:   Steven Chase is a 32 y.o. male with a PMH HTN, OSA, and obesity who is being seen today for the evaluation of CP and SOB at the request of Dr. Elvera Lennox.  History of Present Illness:   Mr. Covington was in his usual state of health until yesterday, when he experienced sudden onset right sided chest pain. He states he was working yesterday afternoon when he was carrying a bag of bottle tops (not heavy) to a conveyer belt when he felt non-radiating right sided chest pain with some associated SOB and diaphoresis. He denied associated N/V, dizziness, lightheadedness or syncope. EMS was activated and he received ASA 324mg  and SL nitro x2 en route to ED with improvement in symptoms.   He states his chest pain did not completely resolve until he was started on medications in the ED; presumably nitro gtt. He is currently CP free and without SOB. He notes some DOE starting Thursday 4/ 4 and LE edema in the form of sock line indentation. He denies recent illness, orthopnea, or PND. He reports compliance with his medications and has not missed any doses recently. He denies family history of CAD or CHF.   Hospital course: hypertensive on arrival, intermittently tachycardic, tachypneic, and hypoxic to 88%. Labs notable for K 3.0, Cr 1.5 (baseline 1.2), Hgb 15.2, PLT 226, Trop 0.06, BNP 1294, Ddimer 0.32. CXR with increased vascular congestion and pulmonary interstitial edema. EKG with sinus rhythm, QTC 487, LVH, TWI in lateral leads (seen on previous EKG, although more pronounced now). Potassium was repleted. Patient was given IV hydralazine and started on a nitroglycerin gtt in the ED for management of hypertensive urgency. He was  admitted to medicine and started on IV lasix 40mg  BID. Cardiology asked to evaluate.   Past Medical History:  Diagnosis Date  . Hypertension   . Obesity   . OSA (obstructive sleep apnea) 11/05/2014   02/2015 -severe -AHI 106/h     Past Surgical History:  Procedure Laterality Date  . HERNIA REPAIR       Home Medications:  Prior to Admission medications   Medication Sig Start Date End Date Taking? Authorizing Provider  amLODipine (NORVASC) 10 MG tablet Take 1 tablet (10 mg total) by mouth daily. 02/12/17  Yes Veryl Speak, FNP  hydrochlorothiazide (HYDRODIURIL) 25 MG tablet Take 1 tablet (25 mg total) by mouth daily. 12/02/17  Yes Corwin Levins, MD  metoprolol succinate (TOPROL-XL) 50 MG 24 hr tablet Take 1 tablet (50 mg total) by mouth daily. Take with or immediately following a meal. 01/26/17  Yes Veryl Speak, FNP    Inpatient Medications: Scheduled Meds: . amLODipine  10 mg Oral Daily  . furosemide  40 mg Intravenous BID  . heparin  5,000 Units Subcutaneous Q8H  . metoprolol succinate  50 mg Oral Daily  . sodium chloride flush  3 mL Intravenous Q12H   Continuous Infusions: . sodium chloride    . nitroGLYCERIN     PRN Meds: sodium chloride, acetaminophen, hydrALAZINE, ondansetron (ZOFRAN) IV, sodium chloride flush, zolpidem  Allergies:   No Known Allergies  Social History:   Social History   Socioeconomic History  . Marital status: Single    Spouse name: Not on file  .  Number of children: 1  . Years of education: 1  . Highest education level: Not on file  Occupational History  . Occupation: Therapist, music: [employer]  Social Needs  . Financial resource strain: Not on file  . Food insecurity:    Worry: Not on file    Inability: Not on file  . Transportation needs:    Medical: Not on file    Non-medical: Not on file  Tobacco Use  . Smoking status: Never Smoker  . Smokeless tobacco: Never Used  Substance and Sexual Activity  . Alcohol  use: No  . Drug use: No  . Sexual activity: Not on file  Lifestyle  . Physical activity:    Days per week: Not on file    Minutes per session: Not on file  . Stress: Not on file  Relationships  . Social connections:    Talks on phone: Not on file    Gets together: Not on file    Attends religious service: Not on file    Active member of club or organization: Not on file    Attends meetings of clubs or organizations: Not on file    Relationship status: Not on file  . Intimate partner violence:    Fear of current or ex partner: Not on file    Emotionally abused: Not on file    Physically abused: Not on file    Forced sexual activity: Not on file  Other Topics Concern  . Not on file  Social History Narrative   Born and raised in [place], [place]. Currently live in a private residence with his mother. Fun: Play with his daughter.   Denies religious beliefs that would effect health care.     Family History:    Family History  Problem Relation Age of Onset  . Hypertension Mother   . Heart disease Maternal Grandfather   . Hypertension Maternal Grandfather   . Hypertension Sister      ROS:  Please see the history of present illness.   All other ROS reviewed and negative.     Physical Exam/Data:   Vitals:   02/05/18 0603 02/05/18 0611 02/05/18 0612 02/05/18 0618  BP:  (!) 145/126  (!) 153/102  Pulse:  98    Resp:  17 17   Temp:  98.4 F (36.9 C)    TempSrc:  Oral    SpO2:  99%    Weight: 222 lb 14.4 oz (101.1 kg)     Height: 5\' 6"  (1.676 m)       Intake/Output Summary (Last 24 hours) at 02/05/2018 0759 Last data filed at 02/05/2018 0504 Gross per 24 hour  Intake -  Output 2200 ml  Net -2200 ml   Filed Weights   02/04/18 1700 02/05/18 0603  Weight: 230 lb (104.3 kg) 222 lb 14.4 oz (101.1 kg)   Body mass index is 35.98 kg/m.  General:  Well nourished, well developed, laying in bed in no acute distress HEENT: sclera anicteric  Neck: no JVD Vascular: No carotid  bruits; distal pulses 2+ bilaterally Cardiac:  normal S1, S2; RRR; no murmurs, gallops, or rubs Lungs:  clear to auscultation bilaterally, no wheezing, rhonchi or rales  Abd: NABS, soft, nontender, no hepatomegaly Ext: trace LE edema Musculoskeletal:  No deformities, BUE and BLE strength normal and equal Skin: warm and dry  Neuro:  CNs 2-12 intact, no focal abnormalities noted Psych:  Normal affect   EKG:  The EKG was  personally reviewed and demonstrates:  sinus rhythm, QTC 487, LVH, TWI in lateral leads (seen on previous EKG, although more pronounced now) Telemetry:  Telemetry was personally reviewed and demonstrates:  NSR, intermittent sinus tachycardia   Relevant CV Studies: Echocardiogram pending  Laboratory Data:  Chemistry Recent Labs  Lab 02/04/18 1707  NA 140  K 3.0*  CL 103  CO2 24  GLUCOSE 107*  BUN 17  CREATININE 1.50*  CALCIUM 9.1  GFRNONAA >60  GFRAA >60  ANIONGAP 13    No results for input(s): PROT, ALBUMIN, AST, ALT, ALKPHOS, BILITOT in the last 168 hours. Hematology Recent Labs  Lab 02/04/18 1707  WBC 8.8  RBC 5.47  HGB 15.2  HCT 46.1  MCV 84.3  MCH 27.8  MCHC 33.0  RDW 14.9  PLT 226   Cardiac Enzymes Recent Labs  Lab 02/04/18 2341  TROPONINI 0.06*    Recent Labs  Lab 02/04/18 1720  TROPIPOC 0.03    BNP Recent Labs  Lab 02/04/18 2125  BNP 1,294.6*    DDimer  Recent Labs  Lab 02/04/18 1707  DDIMER 0.32    Radiology/Studies:  Dg Chest 2 View  Result Date: 02/04/2018 CLINICAL DATA:  Sudden onset chest pain. EXAM: CHEST - 2 VIEW COMPARISON:  Chest x-ray dated September 12, 2017. FINDINGS: Stable cardiomegaly. Increased pulmonary vascular congestion and interstitial edema. Patchy atelectasis at the lung bases. No pleural effusion or pneumothorax. No acute osseous abnormality. IMPRESSION: 1. Increased vascular congestion and pulmonary interstitial edema. Electronically Signed   By: Obie Dredge M.D.   On: 02/04/2018 17:38     Assessment and Plan:   1. Acute pulmonary edema: patient presents with CP and SOB. Exam notable for tachypnea, crackles, and LE edema yesterday; resolved at time of this interview. CXR with pulmonary edema and vascular congestion. BNP >1200. Patient was given IV lasix 40mg  with -2.2L UOP (intake not documented). Suspect acute CHF in the setting of uncontrolled HTN.  - Can likely transition to po lasix - Monitor strict I&Os and daily weights - Echo pending - Continue metoprolol and amlodipine   2. Hypertensive urgency: patient with poorly controlled HTN. BP 190/155 in the ED. Received IV hydralazine and started on a nitroglycerin gtt  - Wean nitroglycerin gtt - increase metoprolol to 100mg  daily; continue amlodipine - HCTZ on hold with diuresis  3. Chest pain: patient experienced sudden onset L sided chest pain with exertion at work. Given ASA and SL nitroglycerin x2 by EMS. Trop poc 0.03; lab Trop 0.06. EKG with sinus rhythm, LVH, TWI in lateral leads (seen in V5-6 on previous EKG); QTC 487. Ddimer negative. Suspect mild elevation 2/2 demand in the setting of acute CHF and hypertensive urgency. Risk factors for ACS include poorly controlled HTN, OSA, and obesity.   - Continue to trend troponin to peak.  - If troponin trend is flat, can consider Cardiac CTA  4. Hypokalemia: K 3.0 on admission; received  - Continue to monitor electrolytes closely with diuresis  5. CKD stage II: Cr 1.5 today; previously 1.2 a year ago - Continue to monitor closely with diuresis  6. Prolonged QTC: EKG with QTC 487 - Avoid QT prolonging medications  7. OSA: - Continue CPAP qHS   For questions or updates, please contact [facility] Please consult [email or web address] for contact info under Cardiology/STEMI.   Signed, Beatriz Stallion, PA-C  02/05/2018 7:59 AM [phone]  Attending Note:   The patient was seen and examined.  Agree with assessment and plan  as noted above.  Changes made to the  above note as needed.  Patient seen and independently examined with Judy Pimple, PA .   We discussed all aspects of the encounter. I agree with the assessment and plan as stated above.  Chest discomfort: The patient presented with a episode of right-sided chest pressure/fullness.  The pain lasted for several hours and was relieved when he came to the emergency room.  He had received nitroglycerin with partial relief.  Patient is not had these pains before.  His troponin last night was 0.06.  We are measuring additional troponin levels. ECG shows normal sinus rhythm at a rate of 97.  He has left ventricular hypertrophy.  He has T wave inversions laterally which are likely due to his LVH.  EKG is basically unchanged from previous tracings several years ago.  We will continue to draw serial troponin levels.  He is diuresed and is feeling quite a bit better.  He is not having any further episodes of chest discomfort.  If his troponin levels continue to rise, he may need a heart catheterization.  If the troponin levels are flat are negative, I would like to perform a CT angiogram tomorrow if were able to perform it and arrange to have it read.  2.  Essential hypertension: His blood pressure remains elevated and his heart rate also remains elevated.  Will increase the Toprol-XL to 100 mg a day. We will also need some additional hydralazine and possibly amlodipine.  Because of his chronic kidney disease and the fact that we may give him contrast tomorrow I would probably avoid ARB or ACE inhibitors today.    I have spent a total of 40 minutes with patient reviewing hospital  notes , telemetry, EKGs, labs and examining patient as well as establishing an assessment and plan that was discussed with the patient. > 50% of time was spent in direct patient care.    Vesta Mixer, Montez Hageman., MD, [facility] 02/05/2018, 10:21 AM [address]  [address] Office [phone] Pager [phone]

## 2018-02-06 ENCOUNTER — Other Ambulatory Visit: Payer: Self-pay

## 2018-02-06 DIAGNOSIS — I5041 Acute combined systolic (congestive) and diastolic (congestive) heart failure: Secondary | ICD-10-CM

## 2018-02-06 LAB — LIPID PANEL
CHOL/HDL RATIO: 6.2 ratio
CHOLESTEROL: 230 mg/dL — AB (ref 0–200)
HDL: 37 mg/dL — AB (ref >40)
LDL Cholesterol: 151 mg/dL — ABNORMAL HIGH (ref 0–99)
Triglycerides: 209 mg/dL — ABNORMAL HIGH (ref <150)
VLDL: 42 mg/dL — AB (ref 0–40)

## 2018-02-06 LAB — COMPREHENSIVE METABOLIC PANEL
ALBUMIN: 3.7 g/dL (ref 3.5–5.0)
ALK PHOS: 47 U/L (ref 38–126)
ALT: 40 U/L (ref 17–63)
ANION GAP: 14 (ref 5–15)
AST: 78 U/L — ABNORMAL HIGH (ref 15–41)
BUN: 11 mg/dL (ref 6–20)
CALCIUM: 9.1 mg/dL (ref 8.9–10.3)
CO2: 26 mmol/L (ref 22–32)
Chloride: 99 mmol/L — ABNORMAL LOW (ref 101–111)
Creatinine, Ser: 1.25 mg/dL — ABNORMAL HIGH (ref 0.61–1.24)
GFR calc Af Amer: >60 mL/min (ref >60)
GFR calc non Af Amer: >60 mL/min (ref >60)
GLUCOSE: 104 mg/dL — AB (ref 65–99)
POTASSIUM: 3.1 mmol/L — AB (ref 3.5–5.1)
SODIUM: 139 mmol/L (ref 135–145)
TOTAL PROTEIN: 7.6 g/dL (ref 6.5–8.1)
Total Bilirubin: 1.8 mg/dL — ABNORMAL HIGH (ref 0.3–1.2)

## 2018-02-06 LAB — CBC
HCT: 53.2 % — ABNORMAL HIGH (ref 39.0–52.0)
HEMOGLOBIN: 17.9 g/dL — AB (ref 13.0–17.0)
MCH: 28.1 pg (ref 26.0–34.0)
MCHC: 33.6 g/dL (ref 30.0–36.0)
MCV: 83.5 fL (ref 78.0–100.0)
Platelets: 250 10*3/uL (ref 150–400)
RBC: 6.37 MIL/uL — AB (ref 4.22–5.81)
RDW: 15.1 % (ref 11.5–15.5)
WBC: 10.9 10*3/uL — ABNORMAL HIGH (ref 4.0–10.5)

## 2018-02-06 LAB — HIV ANTIBODY (ROUTINE TESTING W REFLEX): HIV SCREEN 4TH GENERATION: NONREACTIVE

## 2018-02-06 LAB — TROPONIN I: TROPONIN I: 1.68 ng/mL — AB (ref <0.03)

## 2018-02-06 LAB — PROTIME-INR
INR: 1.24
Prothrombin Time: 15.5 seconds — ABNORMAL HIGH (ref 11.4–15.2)

## 2018-02-06 LAB — HEMOGLOBIN A1C
Hgb A1c MFr Bld: 5.5 % (ref 4.8–5.6)
Mean Plasma Glucose: 111.15 mg/dL

## 2018-02-06 LAB — TSH: TSH: 1.953 u[IU]/mL (ref 0.350–4.500)

## 2018-02-06 MED ORDER — HYDRALAZINE HCL 10 MG PO TABS
10.0000 mg | ORAL_TABLET | Freq: Four times a day (QID) | ORAL | Status: DC
  Administered 2018-02-06 (×2): 10 mg via ORAL
  Filled 2018-02-06 (×2): qty 1

## 2018-02-06 MED ORDER — SPIRONOLACTONE 25 MG PO TABS
25.0000 mg | ORAL_TABLET | Freq: Every day | ORAL | Status: DC
  Administered 2018-02-06 – 2018-02-09 (×4): 25 mg via ORAL
  Filled 2018-02-06 (×4): qty 1

## 2018-02-06 MED ORDER — SODIUM CHLORIDE 0.9 % IV SOLN
INTRAVENOUS | Status: DC
  Administered 2018-02-07: 06:00:00 via INTRAVENOUS

## 2018-02-06 MED ORDER — SODIUM CHLORIDE 0.9% FLUSH: 3.0000 mL | INTRAVENOUS | Status: DC | PRN

## 2018-02-06 MED ORDER — POTASSIUM CHLORIDE CRYS ER 20 MEQ PO TBCR
40.0000 meq | EXTENDED_RELEASE_TABLET | ORAL | Status: AC
  Administered 2018-02-06 (×2): 40 meq via ORAL
  Filled 2018-02-06 (×2): qty 2

## 2018-02-06 MED ORDER — POTASSIUM CHLORIDE CRYS ER 20 MEQ PO TBCR
30.0000 meq | EXTENDED_RELEASE_TABLET | Freq: Two times a day (BID) | ORAL | Status: DC
  Administered 2018-02-06 – 2018-02-09 (×6): 30 meq via ORAL
  Filled 2018-02-06 (×6): qty 1

## 2018-02-06 MED ORDER — SODIUM CHLORIDE 0.9% FLUSH
3.0000 mL | Freq: Two times a day (BID) | INTRAVENOUS | Status: DC
  Administered 2018-02-06 – 2018-02-07 (×2): 3 mL via INTRAVENOUS

## 2018-02-06 MED ORDER — ASPIRIN 81 MG PO CHEW
81.0000 mg | CHEWABLE_TABLET | ORAL | Status: AC
  Administered 2018-02-07: 81 mg via ORAL
  Filled 2018-02-06: qty 1

## 2018-02-06 MED ORDER — POTASSIUM CHLORIDE CRYS ER 20 MEQ PO TBCR
40.0000 meq | EXTENDED_RELEASE_TABLET | Freq: Once | ORAL | Status: AC
  Administered 2018-02-06: 40 meq via ORAL
  Filled 2018-02-06: qty 2

## 2018-02-06 MED ORDER — HYDRALAZINE HCL 25 MG PO TABS
25.0000 mg | ORAL_TABLET | Freq: Four times a day (QID) | ORAL | Status: DC
  Administered 2018-02-06 – 2018-02-09 (×10): 25 mg via ORAL
  Filled 2018-02-06 (×11): qty 1

## 2018-02-06 MED ORDER — CARVEDILOL 25 MG PO TABS
25.0000 mg | ORAL_TABLET | Freq: Two times a day (BID) | ORAL | Status: DC
  Administered 2018-02-06 – 2018-02-07 (×2): 25 mg via ORAL
  Filled 2018-02-06 (×2): qty 1

## 2018-02-06 MED ORDER — SODIUM CHLORIDE 0.9 % IV SOLN: 250.0000 mL | INTRAVENOUS | Status: DC | PRN

## 2018-02-06 MED ORDER — LOSARTAN POTASSIUM 50 MG PO TABS
50.0000 mg | ORAL_TABLET | Freq: Every day | ORAL | Status: DC
  Administered 2018-02-06: 50 mg via ORAL
  Filled 2018-02-06: qty 1

## 2018-02-06 NOTE — Progress Notes (Signed)
Progress Note  Patient Name: Steven Chase Date of Encounter: 02/06/2018  Primary Cardiologist: Becci Batty  Subjective   32 year old gentleman with a history of hypertension, obstructive sleep apnea, obesity who we are asked to see for further evaluation of chest pain and shortness of breath by Dr. Wyonia Hough.  Echocardiogram yesterday reveals markedly depressed left ventricular systolic function with an ejection fraction of 15%.  Is remained hypertensive.  We have gradually increased his medications.    Inpatient Medications    Scheduled Meds: . amLODipine  10 mg Oral Daily  . furosemide  40 mg Intravenous BID  . heparin  5,000 Units Subcutaneous Q8H  . hydrALAZINE  10 mg Oral QID  . metoprolol succinate  100 mg Oral Daily  . sodium chloride flush  3 mL Intravenous Q12H   Continuous Infusions: . sodium chloride     PRN Meds: sodium chloride, acetaminophen, hydrALAZINE, ondansetron (ZOFRAN) IV, sodium chloride flush, zolpidem   Vital Signs    Vitals:   02/06/18 0115 02/06/18 0356 02/06/18 0413 02/06/18 0925  BP:  (!) 175/120  (!) 152/108  Pulse: (!) 108 (!) 108  (!) 118  Resp:  20    Temp: 97.8 F (36.6 C) 98.2 F (36.8 C)    TempSrc: Oral Oral    SpO2: 99% 98%    Weight:   218 lb 9.6 oz (99.2 kg)   Height:        Intake/Output Summary (Last 24 hours) at 02/06/2018 1005 Last data filed at 02/06/2018 3244 Gross per 24 hour  Intake 993 ml  Output 1450 ml  Net -457 ml   Filed Weights   02/04/18 1700 02/05/18 0603 02/06/18 0413  Weight: 230 lb (104.3 kg) 222 lb 14.4 oz (101.1 kg) 218 lb 9.6 oz (99.2 kg)    Telemetry    Sinus tach  - Personally Reviewed  ECG     sinus tach  - Personally Reviewed  Physical Exam   GEN:  Young black gentleman, no acute distress. Neck: No JVD Cardiac:  Regular rate S1-S2.  His heart rate is tachycardic.  I did not hear a gallop when examined in a seated position. Respiratory: Clear to auscultation bilaterally. GI: Soft,  nontender, non-distended  MS: No edema; No deformity. Neuro:  Nonfocal  Psych: Normal affect   Labs    Chemistry Recent Labs  Lab 02/04/18 1707 02/05/18 0435 02/06/18 0353  NA 140 140 139  K 3.0* 2.9* 3.1*  CL 103 100* 99*  CO2 24 22 26   GLUCOSE 107* 116* 104*  BUN 17 13 11   CREATININE 1.50* 1.40* 1.25*  CALCIUM 9.1 9.0 9.1  PROT  --   --  7.6  ALBUMIN  --   --  3.7  AST  --   --  78*  ALT  --   --  40  ALKPHOS  --   --  47  BILITOT  --   --  1.8*  GFRNONAA >60 >60 >60  GFRAA >60 >60 >60  ANIONGAP 13 18* 14     Hematology Recent Labs  Lab 02/04/18 1707 02/06/18 0353  WBC 8.8 10.9*  RBC 5.47 6.37*  HGB 15.2 17.9*  HCT 46.1 53.2*  MCV 84.3 83.5  MCH 27.8 28.1  MCHC 33.0 33.6  RDW 14.9 15.1  PLT 226 250    Cardiac Enzymes Recent Labs  Lab 02/04/18 2341 02/05/18 1034 02/05/18 1613 02/05/18 2307  TROPONINI 0.06* 0.09* 0.50* 1.68*    Recent Labs  Lab  02/04/18 1720  TROPIPOC 0.03     BNP Recent Labs  Lab 02/04/18 2125  BNP 1,294.6*     DDimer  Recent Labs  Lab 02/04/18 1707  DDIMER 0.32     Radiology    Dg Chest 2 View  Result Date: 02/04/2018 CLINICAL DATA:  Sudden onset chest pain. EXAM: CHEST - 2 VIEW COMPARISON:  Chest x-ray dated September 12, 2017. FINDINGS: Stable cardiomegaly. Increased pulmonary vascular congestion and interstitial edema. Patchy atelectasis at the lung bases. No pleural effusion or pneumothorax. No acute osseous abnormality. IMPRESSION: 1. Increased vascular congestion and pulmonary interstitial edema. Electronically Signed   By: Obie Dredge M.D.   On: 02/04/2018 17:38    Cardiac Studies     Patient Profile     32 y.o. male history of hypertension.  He was admitted with chest pain.  Assessment & Plan    1.  Acute combined systolic and diastolic congestive heart failure: The patient presents with chest discomfort.  Echocardiogram shows an ejection fraction of 15%. He is net -3.1 L so far this  admission.  His renal function has improved.  Will start losartan 50 mg a day, Aldactone 25 mg a day.  Will increase the hydralazine to 25 mg 4 times a day.  Will change the Toprol-XL to carvedilol.  He will need a right and left heart catheterization tomorrow.  We discussed the risks, benefits, and options around heart catheter ablation.  He understands and agrees to proceed.  2.  Hypokalemia: We will supplement his potassium level. We will start him on Aldactone.  He may have high Renin hypertension   3.  Chronic kidney disease: His creatinine has improved slightly with diuresis.  We will continue to follow.  For questions or updates, please contact [facility] Please consult [email or web address] for contact info under Cardiology/STEMI.      Signed, Kristeen Miss, MD  02/06/2018, 10:05 AM

## 2018-02-06 NOTE — Progress Notes (Signed)
Pt BP 92/63 after receiving evening BP medication. BP prior to administration was 161/106. On call MD notified. Instructed to hold all BP medications for SBP less than 120 until am. Pt asymptomatic. Will continue to monitor.  Joylene Grapes, RN, BSN

## 2018-02-06 NOTE — Progress Notes (Signed)
PROGRESS NOTE  Steven Chase FAO:[id number] DOB: 12-17-1985 DOA: 02/04/2018 PCP: Veryl Speak, FNP   LOS: 2 days   Brief Narrative / Interim history: 32 year old male with hypertension, sleep apnea, obesity, who came to the hospital on 4/5 due to shortness of breath and chest pain of rather sudden onset while being at work.  He also has noticed progressive lower extremity swelling over the last few days to weeks.  On arrival to ED he was found to be in hypertensive urgency, he also had significant fluid overload on chest x-ray, d-dimer was normal, troponin was normal and BNP was elevated.  He was started on nitroglycerin infusion, Lasix and was admitted to stepdown  Assessment & Plan: Principal Problem:   Acute pulmonary edema (HCC) Active Problems:   Essential hypertension   OSA (obstructive sleep apnea)   Hypokalemia   Hypertensive crisis   Chest pain   CKD (chronic kidney disease), stage II   Acute pulmonary edema with hypoxic respiratory failure due to acute combined systolic and diastolic CHF -He is on IV Lasix, clinically improved, on room air this morning, net -3.8 L -2D echo done on 4/6 showed EF of 15% -Cardiology is following, plan for cardiac catheterization likely tomorrow -Follow strict I's and O's, daily weights  Hypertension with hypertensive urgency -Currently on Norvasc 10, Coreg 25 twice daily, hydralazine 25 4 times daily, losartan 50 and spironolactone 25 -Blood pressure 150s-170s, continue to titrate as required  Chest pain /NSTEMI type II -Improved with nitroglycerin, also received aspirin -Troponin elevated and continued to climb, 1.68 most recent, likely demand ischemia but will receive a cardiac catheterization hopefully on Monday  Hypokalemia -Continue to replete and monitor while getting diuresed  Chronic kidney disease stage II -Creatinine 1.5 on admission from 1.2 last year, improving with diuresis 1.2 this morning  Obstructive sleep  apnea -Continue CPAP  Obesity -Counseled on 4/6   DVT prophylaxis: Lovenox Code Status: Full code Family Communication: no family at bedside Disposition Plan: remain in SDU   Consultants:   Cardiology   Procedures:   2D echo Impressions: - Severe global reduction in LV systolic function; moderate diastolic dysfunction with elevated LV filling pressure; severe LVH; moderate LVE; trace AI; mild MR; mild LAE; trace TR with mildly elevated pulmonary pressure; small pericardial effusion.  Antimicrobials:  None    Subjective: -Feeling back to baseline, asking if he can go home.  No chest pain, no shortness of breath.  Objective: Vitals:   02/06/18 0925 02/06/18 1029 02/06/18 1217 02/06/18 1235  BP: (!) 152/108 (!) 161/124 (!) 159/128 (!) 170/131  Pulse: (!) 118 97    Resp:  15    Temp:      TempSrc:      SpO2:      Weight:      Height:        Intake/Output Summary (Last 24 hours) at 02/06/2018 1303 Last data filed at 02/06/2018 1136 Gross per 24 hour  Intake 753 ml  Output 1700 ml  Net -947 ml   Filed Weights   02/04/18 1700 02/05/18 0603 02/06/18 0413  Weight: 104.3 kg (230 lb) 101.1 kg (222 lb 14.4 oz) 99.2 kg (218 lb 9.6 oz)    Examination:  Constitutional: No distress, in chair Eyes: No scleral icterus, lids and conjunctivae normal ENMT: Moist mucous membranes Respiratory: Clear to auscultation bilaterally without wheezing, without crackles.  Normal respiratory effort without accessory muscle use Cardiovascular: Regular rate and rhythm without murmurs, trace lower extremity edema  Abdomen: Soft, nontender, nondistended, bowel sounds positive  Data Reviewed: I have independently reviewed following labs and imaging studies   CXR -evidence for fluid overload EKG -sinus rhythm, LVH, T wave inversion in lateral leads  CBC: Recent Labs  Lab 02/04/18 1707 02/06/18 0353  WBC 8.8 10.9*  HGB 15.2 17.9*  HCT 46.1 53.2*  MCV 84.3 83.5  PLT 226 250   Basic  Metabolic Panel: Recent Labs  Lab 02/04/18 1707 02/05/18 0435 02/06/18 0353  NA 140 140 139  K 3.0* 2.9* 3.1*  CL 103 100* 99*  CO2 24 22 26   GLUCOSE 107* 116* 104*  BUN 17 13 11   CREATININE 1.50* 1.40* 1.25*  CALCIUM 9.1 9.0 9.1  MG  --  1.9  --    GFR: Estimated Creatinine Clearance: 93.6 mL/min (A) (by C-G formula based on SCr of 1.25 mg/dL (H)). Liver Function Tests: Recent Labs  Lab 02/06/18 0353  AST 78*  ALT 40  ALKPHOS 47  BILITOT 1.8*  PROT 7.6  ALBUMIN 3.7   No results for input(s): LIPASE, AMYLASE in the last 168 hours. No results for input(s): AMMONIA in the last 168 hours. Coagulation Profile: No results for input(s): INR, PROTIME in the last 168 hours. Cardiac Enzymes: Recent Labs  Lab 02/04/18 2341 02/05/18 1034 02/05/18 1613 02/05/18 2307  TROPONINI 0.06* 0.09* 0.50* 1.68*   BNP (last 3 results) No results for input(s): PROBNP in the last 8760 hours. HbA1C: Recent Labs    02/06/18 0353  HGBA1C 5.5   CBG: No results for input(s): GLUCAP in the last 168 hours. Lipid Profile: Recent Labs    02/06/18 0353  CHOL 230*  HDL 37*  LDLCALC 151*  TRIG 209*  CHOLHDL 6.2   Thyroid Function Tests: Recent Labs    02/06/18 0353  TSH 1.953   Anemia Panel: No results for input(s): VITAMINB12, FOLATE, FERRITIN, TIBC, IRON, RETICCTPCT in the last 72 hours. Urine analysis:    Component Value Date/Time   COLORURINE YELLOW 07/01/2016 1157   APPEARANCEUR CLOUDY (A) 07/01/2016 1157   LABSPEC >=1.030 01/12/2018 1038   PHURINE 6.0 01/12/2018 1038   GLUCOSEU NEGATIVE 01/12/2018 1038   HGBUR MODERATE (A) 01/12/2018 1038   BILIRUBINUR NEGATIVE 01/12/2018 1038   KETONESUR NEGATIVE 01/12/2018 1038   PROTEINUR >=300 (A) 01/12/2018 1038   UROBILINOGEN 0.2 01/12/2018 1038   NITRITE NEGATIVE 01/12/2018 1038   LEUKOCYTESUR NEGATIVE 01/12/2018 1038   Sepsis Labs: Invalid input(s): PROCALCITONIN, LACTICIDVEN  Recent Results (from the past 240 hour(s))   MRSA PCR Screening     Status: None   Collection Time: 02/05/18  6:22 AM  Result Value Ref Range Status   MRSA by PCR NEGATIVE NEGATIVE Final    Comment:        The GeneXpert MRSA Assay (FDA approved for NASAL specimens only), is one component of a comprehensive MRSA colonization surveillance program. It is not intended to diagnose MRSA infection nor to guide or monitor treatment for MRSA infections. Performed at [facility], [address], [place], [place] [address]       Radiology Studies: Dg Chest 2 View  Result Date: 02/04/2018 CLINICAL DATA:  Sudden onset chest pain. EXAM: CHEST - 2 VIEW COMPARISON:  Chest x-ray dated September 12, 2017. FINDINGS: Stable cardiomegaly. Increased pulmonary vascular congestion and interstitial edema. Patchy atelectasis at the lung bases. No pleural effusion or pneumothorax. No acute osseous abnormality. IMPRESSION: 1. Increased vascular congestion and pulmonary interstitial edema. Electronically Signed   By: Chrissie Noa  Howell Pringle M.D.   On: 02/04/2018 17:38     Scheduled Meds: . amLODipine  10 mg Oral Daily  . [START ON 02/07/2018] aspirin  81 mg Oral Pre-Cath  . carvedilol  25 mg Oral BID WC  . furosemide  40 mg Intravenous BID  . heparin  5,000 Units Subcutaneous Q8H  . hydrALAZINE  25 mg Oral QID  . losartan  50 mg Oral Daily  . potassium chloride  30 mEq Oral BID  . sodium chloride flush  3 mL Intravenous Q12H  . sodium chloride flush  3 mL Intravenous Q12H  . spironolactone  25 mg Oral Daily   Continuous Infusions: . sodium chloride    . sodium chloride    . [START ON 02/07/2018] sodium chloride      Pamella Pert, MD, PhD [facility] Pager [phone]  If 7PM-7AM, please contact night-coverage [email or web address] Password [email or web address] 02/06/2018, 1:03 PM

## 2018-02-06 NOTE — Progress Notes (Signed)
PT not ready to put CPAP on.  RN will call when PT is ready.

## 2018-02-06 NOTE — Progress Notes (Signed)
Reassessed patients blood pressure after giving 25mg  Coreg. Blood pressure was 92/63. Patient sitting up in bed watching TV. Patient reports he feels "fine".

## 2018-02-06 NOTE — Progress Notes (Signed)
Paged Cardiology for BP 171/113. PRN hydralazine not yet due

## 2018-02-06 NOTE — Progress Notes (Signed)
RN caring for patient was made aware of patients blood pressure of 92/63.

## 2018-02-06 NOTE — Progress Notes (Signed)
Pt with elevated bp of 170/131..... Denies headache. No neurological compromise noted at this time. 10 mg of  Hydralazine given. Will reassess in 1 hour.

## 2018-02-07 ENCOUNTER — Inpatient Hospital Stay (HOSPITAL_COMMUNITY)
Admission: EM | Disposition: A | Discharge status: Home / Self Care | Payer: Self-pay | Source: Home / Self Care | Attending: Internal Medicine

## 2018-02-07 DIAGNOSIS — I420 Dilated cardiomyopathy: Secondary | ICD-10-CM

## 2018-02-07 DIAGNOSIS — I214 Non-ST elevation (NSTEMI) myocardial infarction: Secondary | ICD-10-CM

## 2018-02-07 DIAGNOSIS — I5021 Acute systolic (congestive) heart failure: Secondary | ICD-10-CM

## 2018-02-07 HISTORY — PX: RIGHT/LEFT HEART CATH AND CORONARY ANGIOGRAPHY: CATH118266

## 2018-02-07 LAB — POCT I-STAT 3, VENOUS BLOOD GAS (G3P V)
Acid-Base Excess: 1 mmol/L (ref 0.0–2.0)
BICARBONATE: 28 mmol/L (ref 20.0–28.0)
Bicarbonate: 27.5 mmol/L (ref 20.0–28.0)
O2 Saturation: 58 %
O2 Saturation: 60 %
PCO2 VEN: 53.1 mmHg (ref 44.0–60.0)
PH VEN: 7.322 (ref 7.250–7.430)
PH VEN: 7.335 (ref 7.250–7.430)
TCO2: 30 mmol/L (ref 22–32)
TCO2: 29 mmol/L (ref 22–32)
pCO2, Ven: 52.5 mmHg (ref 44.0–60.0)
pO2, Ven: 33 mmHg (ref 32.0–45.0)
pO2, Ven: 34 mmHg (ref 32.0–45.0)

## 2018-02-07 LAB — CBC
HCT: 53.5 % — ABNORMAL HIGH (ref 39.0–52.0)
HEMOGLOBIN: 17.9 g/dL — AB (ref 13.0–17.0)
MCH: 28.8 pg (ref 26.0–34.0)
MCHC: 33.5 g/dL (ref 30.0–36.0)
MCV: 86 fL (ref 78.0–100.0)
PLATELETS: 219 10*3/uL (ref 150–400)
RBC: 6.22 MIL/uL — AB (ref 4.22–5.81)
RDW: 15.9 % — ABNORMAL HIGH (ref 11.5–15.5)
WBC: 11.7 10*3/uL — AB (ref 4.0–10.5)

## 2018-02-07 LAB — POCT I-STAT 3, ART BLOOD GAS (G3+)
Acid-base deficit: 1 mmol/L (ref 0.0–2.0)
BICARBONATE: 24.5 mmol/L (ref 20.0–28.0)
O2 Saturation: 93 %
PO2 ART: 71 mmHg — AB (ref 83.0–108.0)
TCO2: 26 mmol/L (ref 22–32)
pCO2 arterial: 43.6 mmHg (ref 32.0–48.0)
pH, Arterial: 7.358 (ref 7.350–7.450)

## 2018-02-07 LAB — BASIC METABOLIC PANEL
Anion gap: 11 (ref 5–15)
BUN: 17 mg/dL (ref 6–20)
CALCIUM: 9.1 mg/dL (ref 8.9–10.3)
CO2: 25 mmol/L (ref 22–32)
Chloride: 103 mmol/L (ref 101–111)
Creatinine, Ser: 1.46 mg/dL — ABNORMAL HIGH (ref 0.61–1.24)
GFR calc Af Amer: >60 mL/min (ref >60)
GFR calc non Af Amer: >60 mL/min (ref >60)
Glucose, Bld: 103 mg/dL — ABNORMAL HIGH (ref 65–99)
Potassium: 3.7 mmol/L (ref 3.5–5.1)
Sodium: 139 mmol/L (ref 135–145)

## 2018-02-07 LAB — CREATININE, SERUM
CREATININE: 1.51 mg/dL — AB (ref 0.61–1.24)
GFR calc Af Amer: >60 mL/min (ref >60)
GFR calc non Af Amer: 60 mL/min — ABNORMAL LOW (ref >60)

## 2018-02-07 SURGERY — RIGHT/LEFT HEART CATH AND CORONARY ANGIOGRAPHY
Anesthesia: LOCAL

## 2018-02-07 MED ORDER — VERAPAMIL HCL 2.5 MG/ML IV SOLN
INTRAVENOUS | Status: AC
  Filled 2018-02-07: qty 2

## 2018-02-07 MED ORDER — HEPARIN (PORCINE) IN NACL 2-0.9 UNIT/ML-% IJ SOLN
INTRAMUSCULAR | Status: AC | PRN
  Administered 2018-02-07 (×2): 500 mL via INTRA_ARTERIAL

## 2018-02-07 MED ORDER — HEPARIN (PORCINE) IN NACL 2-0.9 UNIT/ML-% IJ SOLN
INTRAMUSCULAR | Status: AC
  Filled 2018-02-07: qty 1500

## 2018-02-07 MED ORDER — LIDOCAINE HCL (PF) 1 % IJ SOLN
INTRAMUSCULAR | Status: DC | PRN
  Administered 2018-02-07 (×2): 2 mL

## 2018-02-07 MED ORDER — LIDOCAINE HCL (PF) 1 % IJ SOLN
INTRAMUSCULAR | Status: AC
  Filled 2018-02-07: qty 30

## 2018-02-07 MED ORDER — SODIUM CHLORIDE 0.9% FLUSH
3.0000 mL | Freq: Two times a day (BID) | INTRAVENOUS | Status: DC
  Administered 2018-02-08 – 2018-02-09 (×2): 3 mL via INTRAVENOUS

## 2018-02-07 MED ORDER — DIGOXIN 125 MCG PO TABS
0.1250 mg | ORAL_TABLET | Freq: Every day | ORAL | Status: DC
  Administered 2018-02-07 – 2018-02-09 (×3): 0.125 mg via ORAL
  Filled 2018-02-07 (×3): qty 1

## 2018-02-07 MED ORDER — MIDAZOLAM HCL 2 MG/2ML IJ SOLN
INTRAMUSCULAR | Status: DC | PRN
  Administered 2018-02-07: 2 mg via INTRAVENOUS

## 2018-02-07 MED ORDER — HEPARIN (PORCINE) IN NACL 2-0.9 UNIT/ML-% IJ SOLN
INTRAMUSCULAR | Status: AC | PRN
  Administered 2018-02-07: 500 mL

## 2018-02-07 MED ORDER — SACUBITRIL-VALSARTAN 24-26 MG PO TABS
1.0000 | ORAL_TABLET | Freq: Two times a day (BID) | ORAL | Status: DC
  Administered 2018-02-07: 1 via ORAL
  Filled 2018-02-07 (×2): qty 1

## 2018-02-07 MED ORDER — HEPARIN SODIUM (PORCINE) 1000 UNIT/ML IJ SOLN
INTRAMUSCULAR | Status: AC
  Filled 2018-02-07: qty 1

## 2018-02-07 MED ORDER — MIDAZOLAM HCL 2 MG/2ML IJ SOLN
INTRAMUSCULAR | Status: AC
  Filled 2018-02-07: qty 2

## 2018-02-07 MED ORDER — SODIUM CHLORIDE 0.9 % IV SOLN: 250.0000 mL | INTRAVENOUS | Status: DC | PRN

## 2018-02-07 MED ORDER — FENTANYL CITRATE (PF) 100 MCG/2ML IJ SOLN
INTRAMUSCULAR | Status: AC
Start: 2018-02-07 — End: ?
  Filled 2018-02-07: qty 2

## 2018-02-07 MED ORDER — FENTANYL CITRATE (PF) 100 MCG/2ML IJ SOLN
INTRAMUSCULAR | Status: DC | PRN
  Administered 2018-02-07: 25 ug via INTRAVENOUS

## 2018-02-07 MED ORDER — IOHEXOL 350 MG/ML SOLN
INTRAVENOUS | Status: DC | PRN
  Administered 2018-02-07: 85 mL via INTRA_ARTERIAL

## 2018-02-07 MED ORDER — SODIUM CHLORIDE 0.9 % IV SOLN
INTRAVENOUS | Status: AC
  Administered 2018-02-07: 15:00:00 via INTRAVENOUS

## 2018-02-07 MED ORDER — VERAPAMIL HCL 2.5 MG/ML IV SOLN
INTRAVENOUS | Status: DC | PRN
  Administered 2018-02-07: 13:00:00 via INTRA_ARTERIAL

## 2018-02-07 MED ORDER — SODIUM CHLORIDE 0.9% FLUSH: 3.0000 mL | INTRAVENOUS | Status: DC | PRN

## 2018-02-07 MED ORDER — MILRINONE LACTATE IN DEXTROSE 20-5 MG/100ML-% IV SOLN
0.2500 ug/kg/min | INTRAVENOUS | Status: DC
  Administered 2018-02-07: 0.25 ug/kg/min via INTRAVENOUS
  Filled 2018-02-07: qty 100

## 2018-02-07 MED ORDER — HEPARIN SODIUM (PORCINE) 1000 UNIT/ML IJ SOLN
INTRAMUSCULAR | Status: DC | PRN
  Administered 2018-02-07: 5000 [IU] via INTRAVENOUS

## 2018-02-07 SURGICAL SUPPLY — 15 items
BAND CMPR LRG ZPHR (HEMOSTASIS) ×1
BAND ZEPHYR COMPRESS 30 LONG (HEMOSTASIS) ×1 IMPLANT
CATH BALLN WEDGE 5F 110CM (CATHETERS) ×2 IMPLANT
CATH INFINITI 5 FR JL3.5 (CATHETERS) ×1 IMPLANT
CATH INFINITI JR4 5F (CATHETERS) ×1 IMPLANT
CATH LAUNCHER 5F EBU3.5 (CATHETERS) ×2 IMPLANT
GLIDESHEATH SLEND A-KIT 6F 22G (SHEATH) ×1 IMPLANT
GUIDEWIRE INQWIRE 1.5J.035X260 (WIRE) IMPLANT
INQWIRE 1.5J .035X260CM (WIRE) ×2
KIT HEART LEFT (KITS) ×2 IMPLANT
PACK CARDIAC CATHETERIZATION (CUSTOM PROCEDURE TRAY) ×2 IMPLANT
SHEATH RAIN 4/5FR (SHEATH) ×1 IMPLANT
TRANSDUCER W/STOPCOCK (MISCELLANEOUS) ×2 IMPLANT
TUBING CIL FLEX 10 FLL-RA (TUBING) ×2 IMPLANT
WIRE EMERALD 3MM-J .025X260CM (WIRE) ×2 IMPLANT

## 2018-02-07 NOTE — Progress Notes (Addendum)
Progress Note  Patient Name: Steven Chase Date of Encounter: 02/07/2018  Primary Cardiologist: Lynessa Almanzar  Subjective   32 year old gentleman with a history of hypertension, obstructive sleep apnea, obesity who we are asked to see for further evaluation of chest pain and shortness of breath by Dr. Wyonia Hough.  Echocardiogram yesterday reveals markedly depressed left ventricular systolic function with an ejection fraction of 15%.  Is remained hypertensive.  We have gradually increased his medications.   04/08: Pt denies CP or SOB, resting comfortably w/ O2 sats 98% on RA while supine   Inpatient Medications    Scheduled Meds: . amLODipine  10 mg Oral Daily  . carvedilol  25 mg Oral BID WC  . furosemide  40 mg Intravenous BID  . heparin  5,000 Units Subcutaneous Q8H  . hydrALAZINE  25 mg Oral QID  . losartan  50 mg Oral Daily  . potassium chloride  30 mEq Oral BID  . sodium chloride flush  3 mL Intravenous Q12H  . sodium chloride flush  3 mL Intravenous Q12H  . spironolactone  25 mg Oral Daily   Continuous Infusions: . sodium chloride    . sodium chloride    . sodium chloride 10 mL/hr at 02/07/18 0559   PRN Meds: sodium chloride, sodium chloride, acetaminophen, hydrALAZINE, ondansetron (ZOFRAN) IV, sodium chloride flush, sodium chloride flush, zolpidem   Vital Signs    Vitals:   02/06/18 2137 02/07/18 0038 02/07/18 0511 02/07/18 0824  BP: 111/90 (!) 127/96 (!) 148/99 (!) 143/96  Pulse:  97 90 89  Resp:      Temp:  98.1 F (36.7 C) 97.8 F (36.6 C) 97.7 F (36.5 C)  TempSrc:  Oral Oral Oral  SpO2:  96% 94% 98%  Weight:   220 lb 1.6 oz (99.8 kg)   Height:        Intake/Output Summary (Last 24 hours) at 02/07/2018 1610 Last data filed at 02/06/2018 2300 Gross per 24 hour  Intake 1110 ml  Output 750 ml  Net 360 ml   Filed Weights   02/05/18 0603 02/06/18 0413 02/07/18 0511  Weight: 222 lb 14.4 oz (101.1 kg) 218 lb 9.6 oz (99.2 kg) 220 lb 1.6 oz (99.8 kg)     Telemetry    Sinus tach, sinus rhythm  - Personally Reviewed  ECG     sinus tach  - Personally Reviewed  Physical Exam   GEN:  Young black gentleman, no acute distress. Neck: No JVD Cardiac:  Regular rate S1-S2.  mildly tachycardic. No gallop or rub. Respiratory: Clear to auscultation bilaterally. GI: Soft, nontender, non-distended  MS: No edema; No deformity. Neuro:  Nonfocal  Psych: Normal affect   Labs    Chemistry Recent Labs  Lab 02/04/18 1707 02/05/18 0435 02/06/18 0353  NA 140 140 139  K 3.0* 2.9* 3.1*  CL 103 100* 99*  CO2 24 22 26   GLUCOSE 107* 116* 104*  BUN 17 13 11   CREATININE 1.50* 1.40* 1.25*  CALCIUM 9.1 9.0 9.1  PROT  --   --  7.6  ALBUMIN  --   --  3.7  AST  --   --  78*  ALT  --   --  40  ALKPHOS  --   --  47  BILITOT  --   --  1.8*  GFRNONAA >60 >60 >60  GFRAA >60 >60 >60  ANIONGAP 13 18* 14     Hematology Recent Labs  Lab 02/04/18 1707 02/06/18 0353  WBC 8.8 10.9*  RBC 5.47 6.37*  HGB 15.2 17.9*  HCT 46.1 53.2*  MCV 84.3 83.5  MCH 27.8 28.1  MCHC 33.0 33.6  RDW 14.9 15.1  PLT 226 250    Cardiac Enzymes Recent Labs  Lab 02/04/18 2341 02/05/18 1034 02/05/18 1613 02/05/18 2307  TROPONINI 0.06* 0.09* 0.50* 1.68*    Recent Labs  Lab 02/04/18 1720  TROPIPOC 0.03     BNP Recent Labs  Lab 02/04/18 2125  BNP 1,294.6*     DDimer  Recent Labs  Lab 02/04/18 1707  DDIMER 0.32     Radiology    No results found.  Cardiac Studies   ECHO: 02/05/2018 - Left ventricle: The cavity size was moderately dilated. Wall   thickness was increased in a pattern of severe LVH. Systolic   function was severely reduced. The estimated ejection fraction   was 15%. Diffuse hypokinesis. Features are consistent with a   pseudonormal left ventricular filling pattern, with concomitant   abnormal relaxation and increased filling pressure (grade 2   diastolic dysfunction). Doppler parameters are consistent with   high  ventricular filling pressure. - Aortic valve: There was trivial regurgitation. - Mitral valve: There was mild regurgitation. - Left atrium: The atrium was mildly dilated. - Pulmonary arteries: Systolic pressure was mildly increased. PA   peak pressure: 38 mm Hg (S). - Pericardium, extracardiac: A small pericardial effusion was   identified. Impressions: - Severe global reduction in LV systolic function; moderate   diastolic dysfunction with elevated LV filling pressure; severe   LVH; moderate LVE; trace AI; mild MR; mild LAE; trace TR with   mildly elevated pulmonary pressure; small pericardial effusion.   Patient Profile     32 y.o. male history of hypertension.  He was admitted with chest pain>>acute systolic CHF and elevated troponin.  Assessment & Plan    1.  Acute combined systolic and diastolic congestive heart failure: The patient presents with chest discomfort.  Echocardiogram shows an ejection fraction of 15%. He is net -2.7 L so far this admission. - currently on Lasix 40 mg IV BID - hydralazine 25 mg qid and spiro 25 mg qd started 04/07 -BB changed to Coreg 25 mg bid 04/07 -for right and left heart catheterization 04/08.  Dr Elease Hashimoto discussed the risks, benefits, and options around heart catheterization.  He understands and agrees to proceed. - volume status good today, hold Lasix for procedure  2.  Hypokalemia:  - got a total of 120 meq 04/07. - BMET today pending, has been drawn - on Aldactone.  He may have high Renin hypertension   3.  Chronic kidney disease: His creatinine improved slightly with diuresis.   - Continue to follow.  4. HTN:  - currently on amlodipine 10 mg qd >>MD advise if we should look for alternative rx since EF is low - also on Coreg 25 mg bid, hydralazine 25 mg qid, Losartan 50 mg qd, spiro 25 mg qd.  - discuss changing losartan to Sherryll Burger with MD  For questions or updates, please contact [facility] Please consult [email or web address] for  contact info under Cardiology/STEMI.      Signed, Theodore Demark, PA-C  02/07/2018, 8:32 AM    Attending Note:   The patient was seen and examined.  Agree with assessment and plan as noted above.  Changes made to the above note as needed.  Patient seen and independently examined with Theodore Demark, PA .   We discussed all aspects of the encounter.  I agree with the assessment and plan as stated above.  1.  Acute combined systolic and diastolic congestive heart failure.  Patient has an ejection fraction of around 50%.  We have diuresed him around 2.7 L.  His blood pressure is much better.  He is scheduled for heart catheterization today.  We discussed the risks, benefits, and options.  He understands and agrees to proceed.  2.  Hypokalemia: This is been replaced  3.  Essential hypertension: Continue aggressive treatment of his high blood pressure.  Agree that Sherryll Burger would be a very good option for [place].  We will DC Losartan and start Entresto 24-26 mg BID tonight.    I have spent a total of 40 minutes with patient reviewing hospital  notes , telemetry, EKGs, labs and examining patient as well as establishing an assessment and plan that was discussed with the patient. > 50% of time was spent in direct patient care.    Vesta Mixer, Montez Hageman., MD, [facility] 02/07/2018, 9:28 AM [address]  [address] Office [phone] Pager [phone]

## 2018-02-07 NOTE — Brief Op Note (Signed)
   02/07/2018  2:15 PM  PATIENT:  Steven Chase  32 y.o. male  PRE-OPERATIVE DIAGNOSIS:  NSTEMI; dilated cardomyopathy  POST-OPERATIVE DIAGNOSIS: Severe dilated nonischemic cardiomyopathy with normal coronary angiography.  Relatively well diuresed.  Cardiac output/index by Fick 3.34, 1.16. -Severely reduced  Right heart cath pressures: PA P 34/24, 29 mmHg.  PCWP 23 mmHg.  RVP 39/8/12 mmHg, RAP 9.  LV P/A BP 107/2/22 mmHg; AoP/MAP 95/69/78 mmHg  PROCEDURE:  Procedure(s): RIGHT/LEFT HEART CATH AND CORONARY ANGIOGRAPHY (N/A)   RHC: right antecubital IV exchanged for 5 French glide sheath --> 5 [place] PA catheter advanced over wire into the RA, RV, PA and PCWP position.  Hemodynamics and PA sat/AO sat obtained.  LHC/Cors: 6 Fr R Radial Sheath --> 5 Fr JR4 & EBU 3.5 Catheteters used for RCA Angiography/LVGram & LCA Angiography respectively.  Catheters removed at end of procedure.  Brachial sheath pulled in PACU holding area,   Radial Sheath - removed in Cath lab Zephyr band - 1405 hr, 12 mL  SURGEON:  Surgeon(s) and Role:    * Marykay Lex, MD - Primary   ANESTHESIA:   local and IV sedation; Lidocaine; 2mg  versed, 25 mcg Fentanyl  EBL:  None/Minimal  BLOOD ADMINISTERED:none  DRAINS: none   MEDICATIONS USED:  Contrast 85 mL  COUNTS:  YES  DICTATION: .Note written in EPIC  PLAN OF CARE: Plan is to transfer to CVICU with Adv CHF Team consult  PATIENT DISPOSITION:  PACU - hemodynamically stable.     Bryan Lemma, M.D., M.S. Interventional Cardiologist   Pager # [phone] Phone # [phone] [address] [place], [place] [address]

## 2018-02-07 NOTE — Progress Notes (Signed)
Nutrition Education Note  RD consulted for nutrition education regarding new onset CHF.  RD provided "Heart Failure Nutrition Therapy" handout from the Academy of Nutrition [employer] Dietetics. Reviewed patient's dietary recall. Provided examples on ways to decrease sodium intake in diet. Discouraged intake of processed foods and use of salt shaker. Encouraged fresh fruits and vegetables as well as whole grain sources of carbohydrates to maximize fiber intake.   RD discussed why it is important for patient to adhere to diet recommendations, and emphasized the role of fluids, foods to avoid, and importance of weighing self daily. Teach back method used.  Expect fair compliance.  Body mass index is 35.53 kg/m. Pt meets criteria for obesity based on current BMI.  Current diet order is heart healthy, patient is consuming approximately 100% of meals at this time. Labs and medications reviewed. No further nutrition interventions warranted at this time. RD contact information provided. If additional nutrition issues arise, please re-consult RD.   Joaquin Courts, RD, LDN, CNSC Pager [phone] After Hours Pager [phone]

## 2018-02-07 NOTE — Progress Notes (Signed)
1420 Pt back from heart cath. Right brachial and right radial both at level 0, no bleeding or complications noted.

## 2018-02-07 NOTE — Consult Note (Addendum)
Advanced Heart Failure Team Consult Note   Primary Physician: Veryl Speak, FNP PCP-Cardiologist:  Kristeen Miss, MD  Reason for Consultation: Low output CHF  HPI:    Steven Chase is seen today for evaluation of low output CHF at the request of Dr. Elease Hashimoto.   Steven Chase is a 32 y.o. male with PMH of HTN, Obesity, and OSA.  Pt was in his USOH up until 02/04/18. 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  He had no N/V, lightheadedness, or syncope. EMS arrived and given ASA and SL nitro en route to ED with improvement in symptoms.  He did not have relieve of symptoms until arriving in ED and started on medications there.  Pertinent labs on admission include K 3.0, Creatinine 1.5, Hgb 15.2, PLT 226, Trop 0.06, BNP 1294, Ddimer negative, CXR showed vascular congestion and edema. EKG showed sinus rhythm, QTC 487, LVH, and TWI in lateral leads (seen in previous EKG but more pronounced).  Patient taken for [facility] today, 02/07/18 which showed Normal coronaries and low output CHF.   Pt feeling OK today. Denies further CP. States he was USOH up until Friday as above. He has 3-4 year history of OSA and HTN.  He had + sleep study but was never compliant with CPAP per patient. Takes his medications. He denies SOB with activity, lightheadedness, exertional CP, palpitations, orthopnea, PND, or peripheral edema at baseline. He does not smoke or drink ETOH. Denies drug use.  No immediate family history of cardiac disease.   L/RHC 02/07/18 - Normal Coronaries RHC Procedural Findings: Hemodynamics (mmHg) RA mean 9 RV 39/8 PA 34/24 (29) PCWP 23 AO 95/68 Cardiac Output (Fick) 3.34 Cardiac Index (Fick) 1.61  Echo 02/05/2018 LVEF 15%, Grade 2 DD, Trivial AI, Mild MR, Mild LAE, PA peak pressure 38 mm Hg, small pericardial effusion noted.  Review of Systems: [y] = yes, [ ]  = no    General: Weight gain [ ] ; Weight loss [ ] ; Anorexia [ ] ; Fatigue [ ] ; Fever [ ] ; Chills [ ] ; Weakness [ ]   Cardiac: Chest pain/pressure [y]; Resting SOB [ ] ; Exertional SOB [y]; Orthopnea [ ] ; Pedal Edema [ ] ; Palpitations [ ] ; Syncope [ ] ; Presyncope [ ] ; Paroxysmal nocturnal dyspnea[ ]   Pulmonary: Cough [ ] ; Wheezing[ ] ; Hemoptysis[ ] ; Sputum [ ] ; Snoring [ ]   GI: Vomiting[ ] ; Dysphagia[ ] ; Melena[ ] ; Hematochezia [ ] ; Heartburn[ ] ; Abdominal pain [ ] ; Constipation [ ] ; Diarrhea [ ] ; BRBPR [ ]   GU: Hematuria[ ] ; Dysuria [ ] ; Nocturia[ ]   Vascular: Pain in legs with walking [ ] ; Pain in feet with lying flat [ ] ; Non-healing sores [ ] ; Stroke [ ] ; TIA [ ] ; Slurred speech [ ] ;  Neuro: Headaches[ ] ; Vertigo[ ] ; Seizures[ ] ; Paresthesias[ ] ;Blurred vision [ ] ; Diplopia [ ] ; Vision changes [ ]   Ortho/Skin: Arthritis [ ] ; Joint pain [ ] ; Muscle pain [ ] ; Joint swelling [ ] ; Back Pain [ ] ; Rash [ ]   Psych: Depression[ ] ; Anxiety[ ]   Heme: Bleeding problems [ ] ; Clotting disorders [ ] ; Anemia [ ]   Endocrine: Diabetes [ ] ; Thyroid dysfunction[ ]   Home Medications Prior to Admission medications   Medication Sig Start Date End Date Taking? Authorizing Provider  amLODipine (NORVASC) 10 MG tablet Take 1 tablet (10 mg total) by mouth daily. 02/12/17  Yes Jeanine Luz  D, FNP  hydrochlorothiazide (HYDRODIURIL) 25 MG tablet Take 1 tablet (25 mg total) by mouth daily. 12/02/17  Yes Corwin Levins, MD  metoprolol succinate (TOPROL-XL) 50 MG 24 hr tablet Take 1 tablet (50 mg total) by mouth daily. Take with or immediately following a meal. 01/26/17  Yes Veryl Speak, FNP    Past Medical History: Past Medical History:  Diagnosis Date  . Hypertension   . Obesity   . OSA (obstructive sleep apnea) 11/05/2014   02/2015 -severe -AHI 106/h     Past Surgical History: Past Surgical History:  Procedure Laterality Date  . HERNIA REPAIR      Family History: Family History  Problem Relation Age of Onset   . Hypertension Mother   . Heart disease Maternal Grandfather   . Hypertension Maternal Grandfather   . Hypertension Sister     Social History: Social History   Socioeconomic History  . Marital status: Single    Spouse name: Not on file  . Number of children: 1  . Years of education: 37  . Highest education level: Not on file  Occupational History  . Occupation: Therapist, music: [employer]  Social Needs  . Financial resource strain: Not on file  . Food insecurity:    Worry: Not on file    Inability: Not on file  . Transportation needs:    Medical: Not on file    Non-medical: Not on file  Tobacco Use  . Smoking status: Never Smoker  . Smokeless tobacco: Never Used  Substance and Sexual Activity  . Alcohol use: No  . Drug use: No  . Sexual activity: Not on file  Lifestyle  . Physical activity:    Days per week: Not on file    Minutes per session: Not on file  . Stress: Not on file  Relationships  . Social connections:    Talks on phone: Not on file    Gets together: Not on file    Attends religious service: Not on file    Active member of club or organization: Not on file    Attends meetings of clubs or organizations: Not on file    Relationship status: Not on file  Other Topics Concern  . Not on file  Social History Narrative   Born and raised in [place], [place]. Currently live in a private residence with his mother. Fun: Play with his daughter.   Denies religious beliefs that would effect health care.     Allergies:  No Known Allergies  Objective:    Vital Signs:   Temp:  [97.5 F (36.4 C)-98.1 F (36.7 C)] 97.5 F (36.4 C) (04/08 1204) Pulse Rate:  [79-97] 96 (04/08 1425) Resp:  [12-27] 27 (04/08 1425) BP: (92-161)/(63-106) 119/93 (04/08 1425) SpO2:  [90 %-99 %] 96 % (04/08 1425) Weight:  [220 lb 1.6 oz (99.8 kg)] 220 lb 1.6 oz (99.8 kg) (04/08 0511) Last BM Date: 02/06/18  Weight change: Filed Weights   02/05/18 0603 02/06/18 0413  02/07/18 0511  Weight: 222 lb 14.4 oz (101.1 kg) 218 lb 9.6 oz (99.2 kg) 220 lb 1.6 oz (99.8 kg)    Intake/Output:   Intake/Output Summary (Last 24 hours) at 02/07/2018 1445 Last data filed at 02/07/2018 1428 Gross per 24 hour  Intake 630 ml  Output 375 ml  Net 255 ml      Physical Exam    General:  Well appearing. No resp difficulty HEENT: Normal Neck: supple. JVP 8-9  cm. Carotids 2+ bilat; no bruits. No lymphadenopathy or thyromegaly appreciated. Cor: PMI nondisplaced. Regular rate & rhythm. No rubs, gallops or murmurs. Lungs: Clear Abdomen: soft, nontender, nondistended. No hepatosplenomegaly. No bruits or masses. Good bowel sounds. Extremities: no cyanosis, clubbing, or rash. Trace ankle edema.  Neuro: alert & orientedx3, cranial nerves grossly intact. moves all 4 extremities w/o difficulty. Affect pleasant   Telemetry   NSR 80-90s, personally reviewed  EKG    NSR 97 bpm, + LVH, + lateral TWI  Labs   Basic Metabolic Panel: Recent Labs  Lab 02/04/18 1707 02/05/18 0435 02/06/18 0353 02/07/18 0717  NA 140 140 139 139  K 3.0* 2.9* 3.1* 3.7  CL 103 100* 99* 103  CO2 24 22 26 25   GLUCOSE 107* 116* 104* 103*  BUN 17 13 11 17   CREATININE 1.50* 1.40* 1.25* 1.46*  CALCIUM 9.1 9.0 9.1 9.1  MG  --  1.9  --   --    Liver Function Tests: Recent Labs  Lab 02/06/18 0353  AST 78*  ALT 40  ALKPHOS 47  BILITOT 1.8*  PROT 7.6  ALBUMIN 3.7   No results for input(s): LIPASE, AMYLASE in the last 168 hours. No results for input(s): AMMONIA in the last 168 hours.  CBC: Recent Labs  Lab 02/04/18 1707 02/06/18 0353  WBC 8.8 10.9*  HGB 15.2 17.9*  HCT 46.1 53.2*  MCV 84.3 83.5  PLT 226 250   Cardiac Enzymes: Recent Labs  Lab 02/04/18 2341 02/05/18 1034 02/05/18 1613 02/05/18 2307  TROPONINI 0.06* 0.09* 0.50* 1.68*   BNP: BNP (last 3 results) Recent Labs    02/04/18 2125  BNP 1,294.6*   ProBNP (last 3 results) No results for input(s): PROBNP in the  last 8760 hours.  CBG: No results for input(s): GLUCAP in the last 168 hours.  Coagulation Studies: Recent Labs    02/06/18 1417  LABPROT 15.5*  INR 1.24   Imaging    No results found.  Medications:    Current Medications: . amLODipine  10 mg Oral Daily  . carvedilol  25 mg Oral BID WC  . furosemide  40 mg Intravenous BID  . heparin  5,000 Units Subcutaneous Q8H  . hydrALAZINE  25 mg Oral QID  . potassium chloride  30 mEq Oral BID  . sacubitril-valsartan  1 tablet Oral BID  . sodium chloride flush  3 mL Intravenous Q12H  . sodium chloride flush  3 mL Intravenous Q12H  . spironolactone  25 mg Oral Daily     Infusions: . sodium chloride    . sodium chloride    . sodium chloride    . milrinone       Patient Profile   Steven Chase is a 32 y.o. male with PMH, HTN, OSA, and obesity admitted to [facility] 02/04/18 with chest pain and SOB.    Found to have new systolic CHF. Cath without obstructive CAD. CHF team consulted with low output CHF on cath.   Assessment/Plan   1. Acute systolic CHF - NICM by cath 02/07/18, with low cardiac output - Echo 02/05/2018 LVEF 15%, Grade 2 DD, Trivial AI, Mild MR, Mild LAE, PA peak pressure 38 mm Hg, small pericardial effusion noted. - NYHA I symptoms at baseline per patient - Coox marginal at 58 and 60% on RHC this afternoon. No immediate indication for inotrope support. Will defer milrinone and PICC line for now.  - Volume status stable to mildly elevated. - Has been getting IV  lasix 40 mg BID. Got a dose this am. Will follow for now with evening dose of Entresto.   - Hold BB in setting of marginal/low cardiac output. - Stop amlodipine to make room for HF meds.  - Add digoxin 0.125 mg daily.  - He was given dose of Entresto 24/26 mg this pm. BP has been on soft side. Will follow response.  - Continue spiro 25 mg daily.  - Plan for cMRI to look for h/o myocarditis. With his age, amyloid would be unlikely. Suspect hypertensive  cardiomyopathy. If suggestive of infiltrative CMP, will order PYP scan/Myeloma panel.   2. Chest discomfort - Atypical.  CXR with edema. Cath w/o obstructive CAD.    - Troponin peaked at 1.68. - D-Dimer negative  3. HTN - LVH on EKG - Will adjust meds in setting of treating his CHF.   4. OSA - Non-compliant with CPAP.  - Will need outpatient sleep study.   5. CKD II - Follow closely with diuresis and meds.   Medication concerns reviewed with patient and pharmacy team. Barriers identified: None at this time.   Length of Stay: 3  Steven Chase  02/07/2018, 2:45 PM  Advanced Heart Failure Team Pager [phone] (M-F; 7a - 4p)  Please contact [facility] for night-coverage after hours (4p -7a ) and weekends on [email or web address]   Patient seen and examined with the above-signed Advanced Practice Provider and/or Housestaff. I personally reviewed laboratory data, imaging studies and relevant notes. I independently examined the patient and formulated the important aspects of the plan. I have edited the note to reflect any of my changes or salient points. I have personally discussed the plan with the patient and/or family.  Echo and cath images reviewed personally.   32 y/o male with longstanding HTN and untreated SA admitted with acute CP and HF symptoms. Echo with EF 15-20%. Cath with normal coronaries. RHC with MV sat 59% Fick CI 1.6 PCWP 22. Denies any h/o previous cardiac symptoms. No recent URI. No FHX HF. No drug or ETOH use. Able to lie flat in bed and ambulate room with out difficulty. Says SBP has been consistently 180-190 for at least 2 years.   On exam Lying flat in bed JVP 8-9 Cor PMI laterally displaced no s3 Lungs clear  Ab obese NT/ND Ext no edema. Warm  32 y/o with new onset, severe NCM likely due to untreated HTN and OSA. Filling pressures only mildly elevated. Cardiac output marginal but no clinic evidence of shock. Would hold off on inotrope support (and  PICC) for now unless he deteriorates. Start digoxan, spiro, low-dose Entresto. Hold b-blocker. Get cMRI in am. Will need outpatient sleep study. Hopefully EF will improve with medical therapy.   Discuss with his mother by phone as well.   Arvilla Meres, MD  9:21 PM

## 2018-02-07 NOTE — Progress Notes (Signed)
CPAP at bedside.  PT is not ready to have CPAP put on.  PT told to have RN call when ready.

## 2018-02-07 NOTE — Progress Notes (Signed)
PROGRESS NOTE  Steven Chase:[id number] DOB: [birth year] DOA: 02/04/2018 PCP: Veryl Speak, FNP   LOS: 3 days   Brief Narrative / Interim history: 32 year old male with hypertension, sleep apnea, obesity, who came to the hospital on 4/5 due to shortness of breath and chest pain of rather sudden onset while being at work.  He also has noticed progressive lower extremity swelling over the last few days to weeks.  On arrival to ED he was found to be in hypertensive urgency, he also had significant fluid overload on chest x-ray, d-dimer was normal, troponin was normal and BNP was elevated.  He was started on nitroglycerin infusion, Lasix and was admitted to stepdown  Assessment & Plan: Principal Problem:   Acute pulmonary edema (HCC) Active Problems:   Essential hypertension   OSA (obstructive sleep apnea)   Hypokalemia   Hypertensive crisis   Chest pain   CKD (chronic kidney disease), stage II   Acute pulmonary edema with hypoxic respiratory failure due to acute combined systolic and diastolic CHF -He is on IV Lasix, clinically improved, on room air this morning -2D echo done on 4/6 showed EF of 15% -Cardiology is following, plan for cardiac catheterization today -Follow strict I's and O's, daily weights  Hypertension with hypertensive urgency -Currently on Norvasc 10, Coreg 25 twice daily, hydralazine 25 4 times daily, Entresto and spironolactone 25 -Blood pressure 150s-170s, continue to titrate as required  Chest pain /NSTEMI type II -Improved with nitroglycerin, also received aspirin -Troponin elevated, cardiac catheterization today  Hypokalemia -Continue to replete and monitor while getting diuresed  Chronic kidney disease stage II -Creatinine 1.5 on admission from 1.2 last year, overall stable at 1.4 this morning  Obstructive sleep apnea -Continue CPAP  Obesity -Counseled on 4/6   DVT prophylaxis: Lovenox Code Status: Full code Family Communication: no  family at bedside Disposition Plan: remain in SDU   Consultants:   Cardiology   Procedures:   2D echo Impressions: - Severe global reduction in LV systolic function; moderate diastolic dysfunction with elevated LV filling pressure; severe LVH; moderate LVE; trace AI; mild MR; mild LAE; trace TR with mildly elevated pulmonary pressure; small pericardial effusion.  Antimicrobials:  None    Subjective: - no chest pain, shortness of breath, no abdominal pain, nausea or vomiting.   Objective: Vitals:   02/07/18 0038 02/07/18 0511 02/07/18 0824 02/07/18 1204  BP: (!) 127/96 (!) 148/99 (!) 143/96 119/87  Pulse: 97 90 89 81  Resp:      Temp: 98.1 F (36.7 C) 97.8 F (36.6 C) 97.7 F (36.5 C) (!) 97.5 F (36.4 C)  TempSrc: Oral Oral Oral Oral  SpO2: 96% 94% 98% 95%  Weight:  99.8 kg (220 lb 1.6 oz)    Height:        Intake/Output Summary (Last 24 hours) at 02/07/2018 1311 Last data filed at 02/07/2018 1207 Gross per 24 hour  Intake 630 ml  Output 200 ml  Net 430 ml   Filed Weights   02/05/18 0603 02/06/18 0413 02/07/18 0511  Weight: 101.1 kg (222 lb 14.4 oz) 99.2 kg (218 lb 9.6 oz) 99.8 kg (220 lb 1.6 oz)    Examination:  Constitutional:NAD  Eyes: No scleral icterus ENMT: Moist mucous membranes Respiratory: Clear to auscultation without wheezing, no crackles Cardiovascular: Regular rate and rhythm, no murmurs rubs or gallops, no lower extremity edema Abdomen: Soft, nontender, nondistended, positive bowel sounds Neuro: Nonfocal, equal strength  Data Reviewed: I have independently reviewed following labs  and imaging studies   CXR -evidence for fluid overload EKG -sinus rhythm, LVH, T wave inversion in lateral leads  CBC: Recent Labs  Lab 02/04/18 1707 02/06/18 0353  WBC 8.8 10.9*  HGB 15.2 17.9*  HCT 46.1 53.2*  MCV 84.3 83.5  PLT 226 250   Basic Metabolic Panel: Recent Labs  Lab 02/04/18 1707 02/05/18 0435 02/06/18 0353 02/07/18 0717  NA 140 140 139  139  K 3.0* 2.9* 3.1* 3.7  CL 103 100* 99* 103  CO2 24 22 26 25   GLUCOSE 107* 116* 104* 103*  BUN 17 13 11 17   CREATININE 1.50* 1.40* 1.25* 1.46*  CALCIUM 9.1 9.0 9.1 9.1  MG  --  1.9  --   --    GFR: Estimated Creatinine Clearance: 80.3 mL/min (A) (by C-G formula based on SCr of 1.46 mg/dL (H)). Liver Function Tests: Recent Labs  Lab 02/06/18 0353  AST 78*  ALT 40  ALKPHOS 47  BILITOT 1.8*  PROT 7.6  ALBUMIN 3.7   No results for input(s): LIPASE, AMYLASE in the last 168 hours. No results for input(s): AMMONIA in the last 168 hours. Coagulation Profile: Recent Labs  Lab 02/06/18 1417  INR 1.24   Cardiac Enzymes: Recent Labs  Lab 02/04/18 2341 02/05/18 1034 02/05/18 1613 02/05/18 2307  TROPONINI 0.06* 0.09* 0.50* 1.68*   BNP (last 3 results) No results for input(s): PROBNP in the last 8760 hours. HbA1C: Recent Labs    02/06/18 0353  HGBA1C 5.5   CBG: No results for input(s): GLUCAP in the last 168 hours. Lipid Profile: Recent Labs    02/06/18 0353  CHOL 230*  HDL 37*  LDLCALC 151*  TRIG 209*  CHOLHDL 6.2   Thyroid Function Tests: Recent Labs    02/06/18 0353  TSH 1.953   Anemia Panel: No results for input(s): VITAMINB12, FOLATE, FERRITIN, TIBC, IRON, RETICCTPCT in the last 72 hours. Urine analysis:    Component Value Date/Time   COLORURINE YELLOW 07/01/2016 1157   APPEARANCEUR CLOUDY (A) 07/01/2016 1157   LABSPEC >=1.030 01/12/2018 1038   PHURINE 6.0 01/12/2018 1038   GLUCOSEU NEGATIVE 01/12/2018 1038   HGBUR MODERATE (A) 01/12/2018 1038   BILIRUBINUR NEGATIVE 01/12/2018 1038   KETONESUR NEGATIVE 01/12/2018 1038   PROTEINUR >=300 (A) 01/12/2018 1038   UROBILINOGEN 0.2 01/12/2018 1038   NITRITE NEGATIVE 01/12/2018 1038   LEUKOCYTESUR NEGATIVE 01/12/2018 1038   Sepsis Labs: Invalid input(s): PROCALCITONIN, LACTICIDVEN  Recent Results (from the past 240 hour(s))  MRSA PCR Screening     Status: None   Collection Time: 02/05/18  6:22  AM  Result Value Ref Range Status   MRSA by PCR NEGATIVE NEGATIVE Final    Comment:        The GeneXpert MRSA Assay (FDA approved for NASAL specimens only), is one component of a comprehensive MRSA colonization surveillance program. It is not intended to diagnose MRSA infection nor to guide or monitor treatment for MRSA infections. Performed at [facility], [address], [place], [place] [address]       Radiology Studies: No results found.   Scheduled Meds: . [MAR Hold] amLODipine  10 mg Oral Daily  . [MAR Hold] carvedilol  25 mg Oral BID WC  . [MAR Hold] furosemide  40 mg Intravenous BID  . [MAR Hold] heparin  5,000 Units Subcutaneous Q8H  . [MAR Hold] hydrALAZINE  25 mg Oral QID  . [MAR Hold] potassium chloride  30 mEq Oral BID  . So [facility]  Hold] sacubitril-valsartan  1 tablet Oral BID  . [MAR Hold] sodium chloride flush  3 mL Intravenous Q12H  . sodium chloride flush  3 mL Intravenous Q12H  . [MAR Hold] spironolactone  25 mg Oral Daily   Continuous Infusions: . [MAR Hold] sodium chloride    . sodium chloride    . sodium chloride 10 mL/hr at 02/07/18 0559    Pamella Pert, MD, PhD [facility] Pager [phone]  If 7PM-7AM, please contact night-coverage [email or web address] Password [email or web address] 02/07/2018, 1:11 PM

## 2018-02-08 ENCOUNTER — Encounter (HOSPITAL_COMMUNITY): Payer: Self-pay | Admitting: Cardiology

## 2018-02-08 ENCOUNTER — Inpatient Hospital Stay (HOSPITAL_COMMUNITY): Payer: BLUE CROSS/BLUE SHIELD

## 2018-02-08 DIAGNOSIS — I429 Cardiomyopathy, unspecified: Secondary | ICD-10-CM

## 2018-02-08 LAB — BASIC METABOLIC PANEL
ANION GAP: 10 (ref 5–15)
BUN: 16 mg/dL (ref 6–20)
CHLORIDE: 103 mmol/L (ref 101–111)
CO2: 24 mmol/L (ref 22–32)
CREATININE: 1.24 mg/dL (ref 0.61–1.24)
Calcium: 9 mg/dL (ref 8.9–10.3)
GFR calc Af Amer: >60 mL/min (ref >60)
GFR calc non Af Amer: >60 mL/min (ref >60)
Glucose, Bld: 101 mg/dL — ABNORMAL HIGH (ref 65–99)
POTASSIUM: 3.7 mmol/L (ref 3.5–5.1)
Sodium: 137 mmol/L (ref 135–145)

## 2018-02-08 LAB — MAGNESIUM: MAGNESIUM: 2.1 mg/dL (ref 1.7–2.4)

## 2018-02-08 LAB — CBC
HCT: 53.6 % — ABNORMAL HIGH (ref 39.0–52.0)
Hemoglobin: 17.8 g/dL — ABNORMAL HIGH (ref 13.0–17.0)
MCH: 28.3 pg (ref 26.0–34.0)
MCHC: 33.2 g/dL (ref 30.0–36.0)
MCV: 85.4 fL (ref 78.0–100.0)
PLATELETS: 256 10*3/uL (ref 150–400)
RBC: 6.28 MIL/uL — ABNORMAL HIGH (ref 4.22–5.81)
RDW: 15.3 % (ref 11.5–15.5)
WBC: 11.4 10*3/uL — AB (ref 4.0–10.5)

## 2018-02-08 MED ORDER — SACUBITRIL-VALSARTAN 49-51 MG PO TABS
1.0000 | ORAL_TABLET | Freq: Two times a day (BID) | ORAL | Status: DC
  Administered 2018-02-08 – 2018-02-09 (×3): 1 via ORAL
  Filled 2018-02-08 (×3): qty 1

## 2018-02-08 MED ORDER — GADOBENATE DIMEGLUMINE 529 MG/ML IV SOLN
34.0000 mL | Freq: Once | INTRAVENOUS | Status: AC | PRN
  Administered 2018-02-08: 34 mL via INTRAVENOUS

## 2018-02-08 MED FILL — Heparin Sodium (Porcine) 2 Unit/ML in Sodium Chloride 0.9%: INTRAMUSCULAR | Qty: 1000 | Status: AC

## 2018-02-08 NOTE — Progress Notes (Addendum)
Advanced Heart Failure Rounding Note  PCP-Cardiologist: Kristeen Miss, MD   Subjective:    Tolerated Entresto very well.  BP trending back up to 140s.   Feeling OK this am. Denies SOB walking to bathroom, hasn't walked halls. No further CP. Denies lightheadedness or dizziness walking around room.   Weight shows up 13 lbs. Likely inaccurate.   Objective:    Weight Range: 233 lb 9.6 oz (106 kg) Body mass index is 37.7 kg/m.   Vital Signs:   Temp:  [97.4 F (36.3 C)-98.2 F (36.8 C)] 98.2 F (36.8 C) (04/09 0441) Pulse Rate:  [48-128] 48 (04/09 0615) Resp:  [12-27] 19 (04/09 0615) BP: (105-150)/(68-114) 144/96 (04/09 0615) SpO2:  [90 %-99 %] 98 % (04/09 0615) Weight:  [233 lb 9.6 oz (106 kg)] 233 lb 9.6 oz (106 kg) (04/09 0441) Last BM Date: 02/07/18  Weight change: Filed Weights   02/06/18 0413 02/07/18 0511 02/08/18 0441  Weight: 218 lb 9.6 oz (99.2 kg) 220 lb 1.6 oz (99.8 kg) 233 lb 9.6 oz (106 kg)   Intake/Output:   Intake/Output Summary (Last 24 hours) at 02/08/2018 0710 Last data filed at 02/07/2018 2130 Gross per 24 hour  Intake 1283.88 ml  Output 625 ml  Net 658.88 ml     Physical Exam    General:  Well appearing. No resp difficulty HEENT: Normal Neck: Supple. JVP ~7-8 cm. Carotids 2+ bilat; no bruits. No lymphadenopathy or thyromegaly appreciated. Cor: PMI nondisplaced. Regular, slightly tachy. No rubs, gallops or murmurs. Lungs: Clear Abdomen: Soft, nontender, nondistended. No hepatosplenomegaly. No bruits or masses. Good bowel sounds. Extremities: No cyanosis, clubbing, or rash. Trace ankle edema.  Neuro: Alert & orientedx3, cranial nerves grossly intact. moves all 4 extremities w/o difficulty. Affect pleasant  Telemetry   NSR 90-100, personally reviewed  EKG    No new tracings.    Labs    CBC Recent Labs    02/07/18 1446 02/08/18 0427  WBC 11.7* 11.4*  HGB 17.9* 17.8*  HCT 53.5* 53.6*  MCV 86.0 85.4  PLT 219 256   Basic  Metabolic Panel Recent Labs    74/25/95 0717 02/07/18 1446 02/08/18 0427  NA 139  --  137  K 3.7  --  3.7  CL 103  --  103  CO2 25  --  24  GLUCOSE 103*  --  101*  BUN 17  --  16  CREATININE 1.46* 1.51* 1.24  CALCIUM 9.1  --  9.0  MG  --   --  2.1   Liver Function Tests Recent Labs    02/06/18 0353  AST 78*  ALT 40  ALKPHOS 47  BILITOT 1.8*  PROT 7.6  ALBUMIN 3.7   No results for input(s): LIPASE, AMYLASE in the last 72 hours. Cardiac Enzymes Recent Labs    02/05/18 1034 02/05/18 1613 02/05/18 2307  TROPONINI 0.09* 0.50* 1.68*    BNP: BNP (last 3 results) Recent Labs    02/04/18 2125  BNP 1,294.6*    ProBNP (last 3 results) No results for input(s): PROBNP in the last 8760 hours.   D-Dimer No results for input(s): DDIMER in the last 72 hours. Hemoglobin A1C Recent Labs    02/06/18 0353  HGBA1C 5.5   Fasting Lipid Panel Recent Labs    02/06/18 0353  CHOL 230*  HDL 37*  LDLCALC 151*  TRIG 209*  CHOLHDL 6.2   Thyroid Function Tests Recent Labs    02/06/18 0353  TSH 1.953  Other results:  Imaging    No results found.  Medications:    Scheduled Medications: . digoxin  0.125 mg Oral Daily  . heparin  5,000 Units Subcutaneous Q8H  . hydrALAZINE  25 mg Oral QID  . potassium chloride  30 mEq Oral BID  . sodium chloride flush  3 mL Intravenous Q12H  . sodium chloride flush  3 mL Intravenous Q12H  . spironolactone  25 mg Oral Daily     Infusions: . sodium chloride    . sodium chloride       PRN Medications:  sodium chloride, sodium chloride, acetaminophen, hydrALAZINE, ondansetron (ZOFRAN) IV, sodium chloride flush, sodium chloride flush, zolpidem  Patient Profile   Steven Chase is a 32 y.o. male with PMH, HTN, OSA, and obesity admitted to [facility] 02/04/18 with chest pain and SOB.    Found to have new systolic CHF. Cath without obstructive CAD. CHF team consulted with low output CHF on cath.   Assessment/Plan   1.  Acute systolic CHF - NICM by cath 02/07/18, with low cardiac output - Echo 02/05/2018 LVEF 15%, Grade 2 DD, Trivial AI, Mild MR, Mild LAE, PA peak pressure 38 mm Hg, small pericardial effusion noted. - Volume status looks OK on exam.  Will avoid lasix for now.  May need prn. - Increase Entresto 49/51 mg BID.  - Holding BB in setting of marginal cardiac output. Suspect this will improve with BP control. Can likely start low dose soon. Tomorrow vs first outpatient visit.  - Continue digoxin 0.125 mg daily. Level next week.  - Continue spiro 25 mg daily.  - Plan for cMRI this am to look h/o myocarditis. With his age, amyloid would be unlikely. Suspect hypertensive cardiomyopathy. If suggestive of infiltrative CMP, will order PYP scan/Myeloma panel. - Hgb A1C 5.5, TSH normal, AST mildly elevated, HIV negative. Hepatitis panel for tomorrow am.    2. Chest discomfort - Atypical.  CXR with edema. Cath w/o obstructive CAD.    - Troponin peaked at 1.68. - D-Dimer negative  3. HTN - Meds as above. Likely large component of his CMP.   4. OSA - Non-compliant with CPAP.  - Will need outpatient sleep study. No change.   5. CKD II - Stable. Baseline Cr 1.4 - 1.4.  Medication concerns reviewed with patient and pharmacy team. Barriers identified: None at this time. Have some concerns about compliance.   Length of Stay: 4  Luane School  02/08/2018, 7:10 AM  Advanced Heart Failure Team Pager [phone] (M-F; 7a - 4p)  Please contact [facility] for night-coverage after hours (4p -7a ) and weekends on [email or web address]  Patient seen and examined with the above-signed Advanced Practice Provider and/or Housestaff. I personally reviewed laboratory data, imaging studies and relevant notes. I independently examined the patient and formulated the important aspects of the plan. I have edited the note to reflect any of my changes or salient points. I have personally discussed the plan with the  patient and/or family.  Feeling much better. Tolerating HF meds well. For cMRI today. Agree with increasing Entresto. Likely home tomorrow with close f/u in HF Clinic.   Arvilla Meres, MD  7:33 AM

## 2018-02-08 NOTE — Progress Notes (Signed)
PROGRESS NOTE  Steven Chase:[id number] DOB: 07-05-1986 DOA: 02/04/2018 PCP: Veryl Speak, FNP   LOS: 4 days   Brief Narrative / Interim history: 32 year old male with hypertension, sleep apnea, obesity, who came to the hospital on 4/5 due to shortness of breath and chest pain of rather sudden onset while being at work.  He also has noticed progressive lower extremity swelling over the last few days to weeks.  On arrival to ED he was found to be in hypertensive urgency, he also had significant fluid overload on chest x-ray, d-dimer was normal, troponin was normal and BNP was elevated.  He was started on nitroglycerin infusion, Lasix and was admitted to stepdown  Assessment & Plan: Principal Problem:   Acute pulmonary edema (HCC) Active Problems:   Essential hypertension   OSA (obstructive sleep apnea)   Hypokalemia   Hypertensive crisis   Chest pain   CKD (chronic kidney disease), stage II   Acute pulmonary edema with hypoxic respiratory failure due to acute combined systolic and diastolic CHF -Admission he was diuresed with IV Lasix, clinically appears euvolemic and his breathing difficulty has resolved and he is stable on room air -2D echo done on 4/6 showed EF of 15% -Heart failure team consulted and are following -Patient underwent a cardiac catheterization yesterday which showed no significant coronary obstruction suggesting nonischemic cardiomyopathy -Currently on digoxin, hydralazine, Entresto, Spironolactone for heart failure team, blood pressure stable  -to get a cardiac MRI today -Potentially home on Wednesday if everything is stable  Hypertension with hypertensive urgency -Currently on above-mentioned regimen, blood pressure is stable on the high side  Chest pain /NSTEMI type II -Improved with nitroglycerin, also received aspirin -Troponin elevated likely due to demand, cardiac catheterization with clean coronaries  Hypokalemia -Needed repletion while  getting diuresed, potassium now stable  Chronic kidney disease stage II -Creatinine 1.5 on admission, has remained stable with diuresis, cardiac catheterization, and its 1.24 this morning  Obstructive sleep apnea -Continue CPAP  Obesity -Counseled on 4/6   DVT prophylaxis: Lovenox Code Status: Full code Family Communication: no family at bedside Disposition Plan: Home in 1 day  Consultants:   Cardiology   Procedures:   2D echo Impressions: - Severe global reduction in LV systolic function; moderate diastolic dysfunction with elevated LV filling pressure; severe LVH; moderate LVE; trace AI; mild MR; mild LAE; trace TR with mildly elevated pulmonary pressure; small pericardial effusion.  Cardiac cath 4/8  There is severe left ventricular systolic dysfunction.  LV end diastolic pressure is moderately elevated.  The left ventricular ejection fraction is less than 25% by visual estimate.  There is trivial (1+) mitral regurgitation.  There is no aortic valve stenosis.   Nonischemic cardiomyopathy with angiographically Normal Coronary Arteries and severely reduced Cardiac Output/Index.  Antimicrobials:  None    Subjective: -Feels well, awaiting cardiac MRI.  No chest pain, shortness of breath.  Objective: Vitals:   02/08/18 0441 02/08/18 0615 02/08/18 0908 02/08/18 1402  BP: (!) 150/114 (!) 144/96 (!) 151/106 (!) 153/94  Pulse: 95 (!) 48 (!) 103   Resp:  19    Temp: 98.2 F (36.8 C)     TempSrc: Oral     SpO2: 95% 98%    Weight: 106 kg (233 lb 9.6 oz)     Height:        Intake/Output Summary (Last 24 hours) at 02/08/2018 1432 Last data filed at 02/08/2018 1000 Gross per 24 hour  Intake 1186.88 ml  Output 250 ml  Net 936.88 ml   Filed Weights   02/06/18 0413 02/07/18 0511 02/08/18 0441  Weight: 99.2 kg (218 lb 9.6 oz) 99.8 kg (220 lb 1.6 oz) 106 kg (233 lb 9.6 oz)    Examination:  Constitutional: NAD, calm, comfortable Eyes:  lids and conjunctivae  normal Respiratory: clear to auscultation bilaterally, no wheezing, no crackles. Normal respiratory effort.  Cardiovascular: Regular rate and rhythm, no murmurs / rubs / gallops. No LE edema. 2+ pedal pulses.  Abdomen: no tenderness. Bowel sounds positive.  Skin: no rashes, lesions, ulcers. No induration Neurologic: non focal   Data Reviewed: I have independently reviewed following labs and imaging studies   CBC: Recent Labs  Lab 02/04/18 1707 02/06/18 0353 02/07/18 1446 02/08/18 0427  WBC 8.8 10.9* 11.7* 11.4*  HGB 15.2 17.9* 17.9* 17.8*  HCT 46.1 53.2* 53.5* 53.6*  MCV 84.3 83.5 86.0 85.4  PLT 226 250 219 256   Basic Metabolic Panel: Recent Labs  Lab 02/04/18 1707 02/05/18 0435 02/06/18 0353 02/07/18 0717 02/07/18 1446 02/08/18 0427  NA 140 140 139 139  --  137  K 3.0* 2.9* 3.1* 3.7  --  3.7  CL 103 100* 99* 103  --  103  CO2 24 22 26 25   --  24  GLUCOSE 107* 116* 104* 103*  --  101*  BUN 17 13 11 17   --  16  CREATININE 1.50* 1.40* 1.25* 1.46* 1.51* 1.24  CALCIUM 9.1 9.0 9.1 9.1  --  9.0  MG  --  1.9  --   --   --  2.1   GFR: Estimated Creatinine Clearance: 97.6 mL/min (by C-G formula based on SCr of 1.24 mg/dL). Liver Function Tests: Recent Labs  Lab 02/06/18 0353  AST 78*  ALT 40  ALKPHOS 47  BILITOT 1.8*  PROT 7.6  ALBUMIN 3.7   No results for input(s): LIPASE, AMYLASE in the last 168 hours. No results for input(s): AMMONIA in the last 168 hours. Coagulation Profile: Recent Labs  Lab 02/06/18 1417  INR 1.24   Cardiac Enzymes: Recent Labs  Lab 02/04/18 2341 02/05/18 1034 02/05/18 1613 02/05/18 2307  TROPONINI 0.06* 0.09* 0.50* 1.68*   BNP (last 3 results) No results for input(s): PROBNP in the last 8760 hours. HbA1C: Recent Labs    02/06/18 0353  HGBA1C 5.5   CBG: No results for input(s): GLUCAP in the last 168 hours. Lipid Profile: Recent Labs    02/06/18 0353  CHOL 230*  HDL 37*  LDLCALC 151*  TRIG 209*  CHOLHDL 6.2    Thyroid Function Tests: Recent Labs    02/06/18 0353  TSH 1.953   Anemia Panel: No results for input(s): VITAMINB12, FOLATE, FERRITIN, TIBC, IRON, RETICCTPCT in the last 72 hours. Urine analysis:    Component Value Date/Time   COLORURINE YELLOW 07/01/2016 1157   APPEARANCEUR CLOUDY (A) 07/01/2016 1157   LABSPEC >=1.030 01/12/2018 1038   PHURINE 6.0 01/12/2018 1038   GLUCOSEU NEGATIVE 01/12/2018 1038   HGBUR MODERATE (A) 01/12/2018 1038   BILIRUBINUR NEGATIVE 01/12/2018 1038   KETONESUR NEGATIVE 01/12/2018 1038   PROTEINUR >=300 (A) 01/12/2018 1038   UROBILINOGEN 0.2 01/12/2018 1038   NITRITE NEGATIVE 01/12/2018 1038   LEUKOCYTESUR NEGATIVE 01/12/2018 1038   Sepsis Labs: Invalid input(s): PROCALCITONIN, LACTICIDVEN  Recent Results (from the past 240 hour(s))  MRSA PCR Screening     Status: None   Collection Time: 02/05/18  6:22 AM  Result Value Ref Range Status   MRSA by PCR NEGATIVE  NEGATIVE Final    Comment:        The GeneXpert MRSA Assay (FDA approved for NASAL specimens only), is one component of a comprehensive MRSA colonization surveillance program. It is not intended to diagnose MRSA infection nor to guide or monitor treatment for MRSA infections. Performed at [facility], [address], [place], [place] [address]       Radiology Studies: No results found.   Scheduled Meds: . digoxin  0.125 mg Oral Daily  . heparin  5,000 Units Subcutaneous Q8H  . hydrALAZINE  25 mg Oral QID  . potassium chloride  30 mEq Oral BID  . sacubitril-valsartan  1 tablet Oral BID  . sodium chloride flush  3 mL Intravenous Q12H  . sodium chloride flush  3 mL Intravenous Q12H  . spironolactone  25 mg Oral Daily   Continuous Infusions: . sodium chloride    . sodium chloride      Pamella Pert, MD, PhD [facility] Pager [phone]  If 7PM-7AM, please contact night-coverage [email or web address] Password [email or web address] 02/08/2018, 2:32 PM

## 2018-02-08 NOTE — Progress Notes (Signed)
Patient states he does not need help placing self on and off CPAP when ready.

## 2018-02-08 NOTE — Care Management (Addendum)
1500 02-08-18 Consult received for Cost on Entresto: Benefits check completed. Medication will need Prior Authorization. 30 day free card provided to patient along with discount card. Entresto in stock at [facility].     S/W JACOB @ [employer] # [phone]    1. ENTRESTO 49-51 MG BID  COVER-YES  PRIOR APPROVAL- YES- # [phone] OPT- 4 , OPT- 1    2. SACUBITRIL-VALSARTAN  49-51 MG BID  COVER- YES  PRIOR APPROVAL- YES # [phone] OPT- 4, OPT-1    PREFERRED PHARMACY : YES- [facility]     Consult received for Bidil- Benefits Check in process. Will make pt aware of cost once completed. B[employer]il discount card provided.   Benefits Check completed for Bidil:   S/W RENEE @ [employer] [employer] # [phone]    BIDIL 20 MG ISOSORBIDE DINITRATE 37.5 MG HYDRALAZINE HYDROCHLORIDE  3 X A DAY    COVER- NOT COVER  PRIOR APPROVAL- YES # [phone]    PREFERRED PHARMACY : YES- [facility]     No further needs from CM at this time.

## 2018-02-09 ENCOUNTER — Telehealth: Payer: Self-pay | Admitting: Family

## 2018-02-09 LAB — BASIC METABOLIC PANEL
ANION GAP: 11 (ref 5–15)
BUN: 10 mg/dL (ref 6–20)
CHLORIDE: 101 mmol/L (ref 101–111)
CO2: 25 mmol/L (ref 22–32)
CREATININE: 1.14 mg/dL (ref 0.61–1.24)
Calcium: 9.2 mg/dL (ref 8.9–10.3)
GFR calc non Af Amer: >60 mL/min (ref >60)
Glucose, Bld: 103 mg/dL — ABNORMAL HIGH (ref 65–99)
POTASSIUM: 3.7 mmol/L (ref 3.5–5.1)
SODIUM: 137 mmol/L (ref 135–145)

## 2018-02-09 MED ORDER — SPIRONOLACTONE 25 MG PO TABS: 25.0000 mg | ORAL_TABLET | Freq: Every day | ORAL | 0 refills | Status: DC

## 2018-02-09 MED ORDER — DIGOXIN 125 MCG PO TABS: 0.1250 mg | ORAL_TABLET | Freq: Every day | ORAL | 0 refills | Status: DC

## 2018-02-09 MED ORDER — POTASSIUM CHLORIDE CRYS ER 20 MEQ PO TBCR: 40.0000 meq | EXTENDED_RELEASE_TABLET | Freq: Every day | ORAL | 0 refills | Status: DC

## 2018-02-09 MED ORDER — SACUBITRIL-VALSARTAN 97-103 MG PO TABS: 1.0000 | ORAL_TABLET | Freq: Two times a day (BID) | ORAL | Status: DC

## 2018-02-09 MED ORDER — POTASSIUM CHLORIDE CRYS ER 20 MEQ PO TBCR: 40.0000 meq | EXTENDED_RELEASE_TABLET | Freq: Every day | ORAL | Status: DC

## 2018-02-09 MED ORDER — SACUBITRIL-VALSARTAN 97-103 MG PO TABS: 1.0000 | ORAL_TABLET | Freq: Two times a day (BID) | ORAL | 0 refills | Status: DC

## 2018-02-09 MED ORDER — ISOSORB DINITRATE-HYDRALAZINE 20-37.5 MG PO TABS: 1.0000 | ORAL_TABLET | Freq: Three times a day (TID) | ORAL | Status: DC

## 2018-02-09 NOTE — Progress Notes (Addendum)
Advanced Heart Failure Rounding Note  PCP-Cardiologist: Kristeen Miss, MD   Subjective:    Tolerated Entresto very well.  BP trending back up to 140s.   Feeling OK this am. Denies lightheadedness or dizziness. No SOB. No CP.   Weight shows up 13 lbs, and then back down 14. Yesterdays weight inaccurate.   cMRI 02/08/18 1. Moderately dilated LV with moderate concentric LV hypertrophy. EF 19% with diffuse hypokinesis.  2.  Normal RV size and systolic function.  3. On delayed enhancement, there was nodular transmural LGE in a small area of the mid inferior wall as described above. There was a subepicardial extension of the LGE nodule. This does not appear to be a coronary disease pattern as it was very discrete. Cardiac sarcoidosis is a concern with the discrete nodule, though LGE is more typically basal septal or inferolateral. LGE is not as diffuse as one would expect with myocarditis. The pattern is not suggestive of amyloidosis.  Objective:    Weight Range: 219 lb 12.8 oz (99.7 kg) Body mass index is 35.48 kg/m.   Vital Signs:   Temp:  [97.5 F (36.4 C)-98 F (36.7 C)] 97.5 F (36.4 C) (04/10 3953) Pulse Rate:  [96-115] 115 (04/10 0931) Resp:  [16-26] 26 (04/10 0633) BP: (105-156)/(66-108) 121/93 (04/10 0931) SpO2:  [94 %-99 %] 94 % (04/10 2023) Weight:  [219 lb 12.8 oz (99.7 kg)] 219 lb 12.8 oz (99.7 kg) (04/10 0733) Last BM Date: 02/08/18  Weight change: Filed Weights   02/08/18 0441 02/09/18 0633 02/09/18 0733  Weight: 233 lb 9.6 oz (106 kg) 219 lb 12.8 oz (99.7 kg) 219 lb 12.8 oz (99.7 kg)   Intake/Output:   Intake/Output Summary (Last 24 hours) at 02/09/2018 0933 Last data filed at 02/08/2018 2230 Gross per 24 hour  Intake 883 ml  Output -  Net 883 ml     Physical Exam    General: Well appearing. No resp difficulty. HEENT: Normal Neck: Supple. JVP ~6 cm. Carotids 2+ bilat; no bruits. No thyromegaly or nodule noted. Cor: PMI nondisplaced. RRR,  No M/G/R noted Lungs: CTAB, normal effort. Abdomen: Soft, non-tender, non-distended, no HSM. No bruits or masses. +BS  Extremities: No cyanosis, clubbing, or rash. R and LLE no edema.  Neuro: Alert & orientedx3, cranial nerves grossly intact. moves all 4 extremities w/o difficulty. Affect pleasant   Telemetry   NSR/ST 90-110s, personally reviewed.   EKG    No new tracings.    Labs    CBC Recent Labs    02/07/18 1446 02/08/18 0427  WBC 11.7* 11.4*  HGB 17.9* 17.8*  HCT 53.5* 53.6*  MCV 86.0 85.4  PLT 219 256   Basic Metabolic Panel Recent Labs    34/35/68 0427 02/09/18 0353  NA 137 137  K 3.7 3.7  CL 103 101  CO2 24 25  GLUCOSE 101* 103*  BUN 16 10  CREATININE 1.24 1.14  CALCIUM 9.0 9.2  MG 2.1  --    Liver Function Tests No results for input(s): AST, ALT, ALKPHOS, BILITOT, PROT, ALBUMIN in the last 72 hours. No results for input(s): LIPASE, AMYLASE in the last 72 hours. Cardiac Enzymes No results for input(s): CKTOTAL, CKMB, CKMBINDEX, TROPONINI in the last 72 hours.  BNP: BNP (last 3 results) Recent Labs    02/04/18 2125  BNP 1,294.6*    ProBNP (last 3 results) No results for input(s): PROBNP in the last 8760 hours.   D-Dimer No results for input(s): DDIMER in  the last 72 hours. Hemoglobin A1C No results for input(s): HGBA1C in the last 72 hours. Fasting Lipid Panel No results for input(s): CHOL, HDL, LDLCALC, TRIG, CHOLHDL, LDLDIRECT in the last 72 hours. Thyroid Function Tests No results for input(s): TSH, T4TOTAL, T3FREE, THYROIDAB in the last 72 hours.  Invalid input(s): FREET3 Other results:  Imaging   Mr Cardiac Morphology W Wo Contrast  Result Date: 02/08/2018 CLINICAL DATA:  Cardiomyopathy of uncertain etiology. EXAM: CARDIAC MRI TECHNIQUE: The patient was scanned on a 1.5 Tesla GE magnet. A dedicated cardiac coil was used. Functional imaging was done using Fiesta sequences. 2,3, and 4 chamber views were done to assess for RWMA's.  Modified Simpson's rule using a short axis stack was used to calculate an ejection fraction on a dedicated work [employer]. The patient received 34 cc of Multihance. After 10 minutes inversion recovery sequences were used to assess for infiltration and scar tissue. CONTRAST:  34 cc Multihance FINDINGS: Limited images of the lung fields showed no gross abnormalities. There was a small circumferential pericardial effusion. The left ventricle was moderately dilated with moderate concentric hypertrophy. EF 19% with diffuse hypokinesis. Normal right ventricular size and systolic function. The left atrium was moderately dilated. Normal right atrial size. Trileaflet aortic valve with trivial regurgitation, no stenosis. Mild to possibly moderate mitral regurgitation. Delayed enhancement imaging showed a discrete, nodular area of late gadolinium enhancement in the mid-inferior wall. The nodule was transmural with a subepicardial extension. Measurements: LVEDV 372 mL LVSV 72 mL LVEF 19% IMPRESSION: 1. Moderately dilated LV with moderate concentric LV hypertrophy. EF 19% with diffuse hypokinesis. 2.  Normal RV size and systolic function. 3. On delayed enhancement, there was nodular transmural LGE in a small area of the mid inferior wall as described above. There was a subepicardial extension of the LGE nodule. This does not appear to be a coronary disease pattern as it was very discrete. Cardiac sarcoidosis is a concern with the discrete nodule, though LGE is more typically basal septal or inferolateral. LGE is not as diffuse as one would expect with myocarditis. The pattern is not suggestive of amyloidosis. Dalton Mclean Electronically Signed   By: Marca Ancona M.D.   On: 02/08/2018 17:37    Medications:    Scheduled Medications: . digoxin  0.125 mg Oral Daily  . heparin  5,000 Units Subcutaneous Q8H  . hydrALAZINE  25 mg Oral QID  . potassium chloride  30 mEq Oral BID  . sacubitril-valsartan  1  tablet Oral BID  . sodium chloride flush  3 mL Intravenous Q12H  . sodium chloride flush  3 mL Intravenous Q12H  . spironolactone  25 mg Oral Daily    Infusions: . sodium chloride    . sodium chloride      PRN Medications: sodium chloride, sodium chloride, acetaminophen, hydrALAZINE, ondansetron (ZOFRAN) IV, sodium chloride flush, sodium chloride flush, zolpidem  Patient Profile   Steven Chase is a 32 y.o. male with PMH, HTN, OSA, and obesity admitted to [facility] 02/04/18 with chest pain and SOB.    Found to have new systolic CHF. Cath without obstructive CAD. CHF team consulted with low output CHF on cath.   Assessment/Plan   1. Acute systolic CHF - NICM by cath 02/07/18, with low cardiac output - Echo 02/05/2018 LVEF 15%, Grade 2 DD, Trivial AI, Mild MR, Mild LAE, PA peak pressure 38 mm Hg, small pericardial effusion noted. - cMRI 02/08/18 LVEF 19%, Normal RV, Discrete nodule in mid  inferior wall concerning for possible Sarcoid involvement. No amyloid.  - Volume status stable on entresto and spiro.  - Increase Entresto 97/103 mg BID.  - Stop Hydralazine.  - Holding BB in setting of marginal cardiac output. Suspect this will improve with BP control. Can likely start low dose soon.  - Continue digoxin 0.125 mg daily. Level next week.  - Continue spiro 25 mg daily.  - Hgb A1C 5.5, TSH normal, AST mildly elevated, HIV negative. Hepatitis panel for tomorrow am.    2. Chest discomfort - Atypical.  CXR with edema. Cath w/o obstructive CAD.    - No further chest pain.  - Troponin peaked at 1.68. - D-Dimer negative  3. HTN - Meds as above. Likely large component of his CMP.   4. OSA - Non-compliant with CPAP.  - Will need outpatient sleep study. No change.   5. CKD II - Stable. Baseline Cr 1.2 - 1.4. - Creatinine 1.14 this am.   6. Hypokalemia - Continue potassium, but will give 40 meq daily.   HF meds for home - Entresto 97/103 mg BID  - Digoxin 0.125 mg daily - Spiro 25  mg daily - Potassium 40 meq daily (He has been taking this admit without hyperkalemia, will continue for now)  Follow up made for HF clinic 02/17/18 at 1000 am.   Medication concerns reviewed with patient and pharmacy team. Barriers identified: None at this time. Have some concerns about compliance.   Length of Stay: 5  Luane School  02/09/2018, 9:33 AM  Advanced Heart Failure Team Pager [phone] (M-F; 7a - 4p)  Please contact [facility] for night-coverage after hours (4p -7a ) and weekends on [email or web address]  Patient seen and examined with the above-signed Advanced Practice Provider and/or Housestaff. I personally reviewed laboratory data, imaging studies and relevant notes. I independently examined the patient and formulated the important aspects of the plan. I have edited the note to reflect any of my changes or salient points. I have personally discussed the plan with the patient and/or family.   He looks good. Much improved. Ok for d/c today on above meds. We will see in HF Clinic soon. cMRI reviewed has small nodule that could be sarcoid but not severe enough to cause cardiomyopathy. Suspect CM due to HTN. Will need PET scan as outpatient to w/u for sarcoid.   Arvilla Meres, MD  11:43 AM

## 2018-02-09 NOTE — Progress Notes (Signed)
Weight noted to down 15lbs from yesterday. Patient re-weighed this am using same scale as previous two days. Patient's re-weight  is correct down 15lbs from previous.

## 2018-02-09 NOTE — Telephone Encounter (Signed)
Copied from CRM [id number]. Topic: Quick Communication - See Telephone Encounter >> Feb 09, 2018  4:35 PM Trula Slade wrote: CRM for notification. See Telephone encounter for: 02/09/18. Patient's mom, Steven Chase, [phone] wanted the provider to know that the patient was released today from [facility] but he cannot afford the medication they prescribed.  So [facility] on [address] will be faxing the office about it.

## 2018-02-09 NOTE — Discharge Summary (Signed)
Physician Discharge Summary  Steven Chase ZOX:[id number] DOB: 03-12-1986 DOA: 02/04/2018  PCP: Veryl Speak, FNP  Admit date: 02/04/2018 Discharge date: 02/09/2018  Admitted From: Home Disposition: Home  Discharge Condition:Stable CODE STATUS:Full Diet recommendation: Heart Healthy  Brief/Interim Summary: 32 year old male with hypertension, sleep apnea, obesity, who came to the hospital on 4/5 due to shortness of breath and chest pain of rather sudden onset while being at work.  He also has noticed progressive lower extremity swelling over the last few days to weeks.  On arrival to ED he was found to be in hypertensive urgency, he also had significant fluid overload on chest x-ray, d-dimer was normal, troponin was normal and BNP was elevated.  He was started on nitroglycerin infusion, Lasix and was admitted to stepdown. Patient evaluated by cardiology after admission.  He underwent cardiac cath which showed no significant coronary obstruction suggesting nonischemic cardiomyopathy.  Patient was started on digoxin, Entresto, Spironolactone.  Also underwent cardiac MRI which showed ejection fraction of 15% with diffuse hypokinesis. Patient has been medically optimized.  He is stable for discharge to home today.  He will follow-up with cardiology as an outpatient.  Following problems were addressed during his hospitalization:  Acute pulmonary edema with hypoxic respiratory failure due to acute combined systolic and diastolic CHF -On admission ,he was diuresed with IV Lasix, clinically appears euvolemic and his breathing difficulty has resolved and he is stable on room air -2D echo done on 4/6 showed EF of 15% -Cardiac MRI done here showed ejection fraction of 15%, diffuse hypokinesis -Heart failure team consulted and were following -Patient underwent a cardiac catheterization  which showed no significant coronary obstruction suggesting nonischemic cardiomyopathy -Currently on digoxin,   Entresto, Spironolactone for heart failure team, blood pressure stable.  Hypertension with hypertensive urgency -Currently on above-mentioned regimen, blood pressure is stable on the high side  Chest pain /NSTEMI type II -Improved with nitroglycerin, also received aspirin -Troponin elevated likely due to demand, cardiac catheterization with clean coronaries  Hypokalemia -Will continue supplementation  Chronic kidney disease stage II -Stable.  Obstructive sleep apnea -Continue CPAP  Obesity -Counseled on the importance of diet and exercise     Discharge Diagnoses:  Principal Problem:   Acute pulmonary edema (HCC) Active Problems:   Essential hypertension   OSA (obstructive sleep apnea)   Hypokalemia   Hypertensive crisis   Chest pain   CKD (chronic kidney disease), stage II    Discharge Instructions  Discharge Instructions    Diet - low sodium heart healthy   Complete by:  As directed    Discharge instructions   Complete by:  As directed    1) Follpw up with cardiology as an outpatient.  Name and number of the provider has been attached. 2) Check BMP in a week. 3)Take prescribed medications as instructed.   Increase activity slowly   Complete by:  As directed      Allergies as of 02/09/2018   No Known Allergies     Medication List    STOP taking these medications   amLODipine 10 MG tablet Commonly known as:  NORVASC   hydrochlorothiazide 25 MG tablet Commonly known as:  HYDRODIURIL   metoprolol succinate 50 MG 24 hr tablet Commonly known as:  TOPROL-XL     TAKE these medications   digoxin 0.125 MG tablet Commonly known as:  LANOXIN Take 1 tablet (0.125 mg total) by mouth daily. Start taking on:  02/10/2018   potassium chloride SA 20 MEQ tablet  Commonly known as:  K-DUR,KLOR-CON Take 2 tablets (40 mEq total) by mouth daily. Start taking on:  02/10/2018   sacubitril-valsartan 97-103 MG Commonly known as:  ENTRESTO Take 1 tablet by  mouth 2 (two) times daily.   spironolactone 25 MG tablet Commonly known as:  ALDACTONE Take 1 tablet (25 mg total) by mouth daily. Start taking on:  02/10/2018      Follow-up Information    Graciella Freer, PA-C Follow up on 02/17/2018.   Specialty:  Physician Assistant Why:  10:00-Parking at ER lot (blue awning to left of ER entrance), also underneath [facility] on [address] (enter through construction entrance, garage code: 1100, elevator to 1st floor). Take a.m. meds and bring bottles to appt. Contact information: [address] [place] [address] [phone]        Veryl Speak, FNP. Schedule an appointment as soon as possible for a visit in 2 week(s).   Specialties:  Family Medicine, Infectious Diseases Contact information: [address] [place] [address] [phone]        Nahser, Deloris Ping, MD .   Specialty:  Cardiology Contact information: [address] [place] [address] [phone]          No Known Allergies  Consultations:  Cardiology   Procedures/Studies: Dg Chest 2 View  Result Date: 02/04/2018 CLINICAL DATA:  Sudden onset chest pain. EXAM: CHEST - 2 VIEW COMPARISON:  Chest x-ray dated September 12, 2017. FINDINGS: Stable cardiomegaly. Increased pulmonary vascular congestion and interstitial edema. Patchy atelectasis at the lung bases. No pleural effusion or pneumothorax. No acute osseous abnormality. IMPRESSION: 1. Increased vascular congestion and pulmonary interstitial edema. Electronically Signed   By: Obie Dredge M.D.   On: 02/04/2018 17:38   Mr Cardiac Morphology W Wo Contrast  Result Date: 02/08/2018 CLINICAL DATA:  Cardiomyopathy of uncertain etiology. EXAM: CARDIAC MRI TECHNIQUE: The patient was scanned on a 1.5 Tesla GE magnet. A dedicated cardiac coil was used. Functional imaging was done using Fiesta sequences. 2,3, and 4 chamber views were done to assess for RWMA's. Modified Simpson's rule  using a short axis stack was used to calculate an ejection fraction on a dedicated work [employer] officer, [employer]. The patient received 34 cc of Multihance. After 10 minutes inversion recovery sequences were used to assess for infiltration and scar tissue. CONTRAST:  34 cc Multihance FINDINGS: Limited images of the lung fields showed no gross abnormalities. There was a small circumferential pericardial effusion. The left ventricle was moderately dilated with moderate concentric hypertrophy. EF 19% with diffuse hypokinesis. Normal right ventricular size and systolic function. The left atrium was moderately dilated. Normal right atrial size. Trileaflet aortic valve with trivial regurgitation, no stenosis. Mild to possibly moderate mitral regurgitation. Delayed enhancement imaging showed a discrete, nodular area of late gadolinium enhancement in the mid-inferior wall. The nodule was transmural with a subepicardial extension. Measurements: LVEDV 372 mL LVSV 72 mL LVEF 19% IMPRESSION: 1. Moderately dilated LV with moderate concentric LV hypertrophy. EF 19% with diffuse hypokinesis. 2.  Normal RV size and systolic function. 3. On delayed enhancement, there was nodular transmural LGE in a small area of the mid inferior wall as described above. There was a subepicardial extension of the LGE nodule. This does not appear to be a coronary disease pattern as it was very discrete. Cardiac sarcoidosis is a concern with the discrete nodule, though LGE is more typically basal septal or inferolateral. LGE is not as diffuse as one  would expect with myocarditis. The pattern is not suggestive of amyloidosis. Dalton Mclean Electronically Signed   By: Marca Ancona M.D.   On: 02/08/2018 17:37    (Echo, Carotid, EGD, Colonoscopy, ERCP)    Subjective: Patient seen and examined the bedside this morning.  Remains comfortable.  Denies any complaints.  Stable for discharge home. Discharge Exam: Vitals:   02/09/18 0633 02/09/18  0931  BP: (!) 144/108 (!) 121/93  Pulse: (!) 106 (!) 115  Resp: (!) 26   Temp: (!) 97.5 F (36.4 C)   SpO2: 94%    Vitals:   02/09/18 0240 02/09/18 0633 02/09/18 0733 02/09/18 0931  BP: (!) 152/98 (!) 144/108  (!) 121/93  Pulse:  (!) 106  (!) 115  Resp:  (!) 26    Temp:  (!) 97.5 F (36.4 C)    TempSrc:  Oral    SpO2:  94%    Weight:  99.7 kg (219 lb 12.8 oz) 99.7 kg (219 lb 12.8 oz)   Height:        General: Pt is alert, awake, not in acute distress,obese Cardiovascular: RRR, S1/S2 +, no rubs, no gallops Respiratory: CTA bilaterally, no wheezing, no rhonchi Abdominal: Soft, NT, ND, bowel sounds + Extremities: no edema, no cyanosis    The results of significant diagnostics from this hospitalization (including imaging, microbiology, ancillary and laboratory) are listed below for reference.     Microbiology: Recent Results (from the past 240 hour(s))  MRSA PCR Screening     Status: None   Collection Time: 02/05/18  6:22 AM  Result Value Ref Range Status   MRSA by PCR NEGATIVE NEGATIVE Final    Comment:        The GeneXpert MRSA Assay (FDA approved for NASAL specimens only), is one component of a comprehensive MRSA colonization surveillance program. It is not intended to diagnose MRSA infection nor to guide or monitor treatment for MRSA infections. Performed at [facility], [address] [place], [place] [address]      Labs: BNP (last 3 results) Recent Labs    02/04/18 2125  BNP 1,294.6*   Basic Metabolic Panel: Recent Labs  Lab 02/05/18 0435 02/06/18 0353 02/07/18 0717 02/07/18 1446 02/08/18 0427 02/09/18 0353  NA 140 139 139  --  137 137  K 2.9* 3.1* 3.7  --  3.7 3.7  CL 100* 99* 103  --  103 101  CO2 22 26 25   --  24 25  GLUCOSE 116* 104* 103*  --  101* 103*  BUN 13 11 17   --  16 10  CREATININE 1.40* 1.25* 1.46* 1.51* 1.24 1.14  CALCIUM 9.0 9.1 9.1  --  9.0 9.2  MG 1.9  --   --   --  2.1  --    Liver Function Tests: Recent Labs   Lab 02/06/18 0353  AST 78*  ALT 40  ALKPHOS 47  BILITOT 1.8*  PROT 7.6  ALBUMIN 3.7   No results for input(s): LIPASE, AMYLASE in the last 168 hours. No results for input(s): AMMONIA in the last 168 hours. CBC: Recent Labs  Lab 02/04/18 1707 02/06/18 0353 02/07/18 1446 02/08/18 0427  WBC 8.8 10.9* 11.7* 11.4*  HGB 15.2 17.9* 17.9* 17.8*  HCT 46.1 53.2* 53.5* 53.6*  MCV 84.3 83.5 86.0 85.4  PLT 226 250 219 256   Cardiac Enzymes: Recent Labs  Lab 02/04/18 2341 02/05/18 1034 02/05/18 1613 02/05/18 2307  TROPONINI 0.06* 0.09* 0.50* 1.68*  BNP: Invalid input(s): POCBNP CBG: No results for input(s): GLUCAP in the last 168 hours. D-Dimer No results for input(s): DDIMER in the last 72 hours. Hgb A1c No results for input(s): HGBA1C in the last 72 hours. Lipid Profile No results for input(s): CHOL, HDL, LDLCALC, TRIG, CHOLHDL, LDLDIRECT in the last 72 hours. Thyroid function studies No results for input(s): TSH, T4TOTAL, T3FREE, THYROIDAB in the last 72 hours.  Invalid input(s): FREET3 Anemia work up No results for input(s): VITAMINB12, FOLATE, FERRITIN, TIBC, IRON, RETICCTPCT in the last 72 hours. Urinalysis    Component Value Date/Time   COLORURINE YELLOW 07/01/2016 1157   APPEARANCEUR CLOUDY (A) 07/01/2016 1157   LABSPEC >=1.030 01/12/2018 1038   PHURINE 6.0 01/12/2018 1038   GLUCOSEU NEGATIVE 01/12/2018 1038   HGBUR MODERATE (A) 01/12/2018 1038   BILIRUBINUR NEGATIVE 01/12/2018 1038   KETONESUR NEGATIVE 01/12/2018 1038   PROTEINUR >=300 (A) 01/12/2018 1038   UROBILINOGEN 0.2 01/12/2018 1038   NITRITE NEGATIVE 01/12/2018 1038   LEUKOCYTESUR NEGATIVE 01/12/2018 1038   Sepsis Labs Invalid input(s): PROCALCITONIN,  WBC,  LACTICIDVEN Microbiology Recent Results (from the past 240 hour(s))  MRSA PCR Screening     Status: None   Collection Time: 02/05/18  6:22 AM  Result Value Ref Range Status   MRSA by PCR NEGATIVE NEGATIVE Final    Comment:        The  GeneXpert MRSA Assay (FDA approved for NASAL specimens only), is one component of a comprehensive MRSA colonization surveillance program. It is not intended to diagnose MRSA infection nor to guide or monitor treatment for MRSA infections. Performed at [facility], [address] [place], [place] [address]      Time coordinating discharge: 35 minutes  SIGNED:   Burnadette Pop, MD  [facility] 02/09/2018, 1:11 PM Pager [phone]  If 7PM-7AM, please contact night-coverage [email or web address] Password [email or web address]

## 2018-02-10 LAB — HEPATITIS PANEL, ACUTE
HCV Ab: <0.1 s/co ratio (ref 0.0–0.9)
HEP B C IGM: NEGATIVE
Hep A IgM: NEGATIVE
Hepatitis B Surface Ag: NEGATIVE

## 2018-02-10 NOTE — Telephone Encounter (Signed)
Pt need to make hosp f/u appt w/any other provider. Tammy Sours is no longer here haven't been seen since 12/2016.Pls call to make first available appt w/any provider for hosp f/u....Raechel Chute

## 2018-02-10 NOTE — Telephone Encounter (Signed)
Appointment scheduled with Dr Jonny Ruiz for tomorrow, 02/11/18.

## 2018-02-11 ENCOUNTER — Encounter: Payer: Self-pay | Admitting: Internal Medicine

## 2018-02-11 ENCOUNTER — Ambulatory Visit: Payer: BLUE CROSS/BLUE SHIELD | Admitting: Internal Medicine

## 2018-02-11 VITALS — BP 136/88 | HR 68 | Temp 97.8°F | Ht 66.0 in | Wt 224.0 lb

## 2018-02-11 DIAGNOSIS — I429 Cardiomyopathy, unspecified: Secondary | ICD-10-CM | POA: Diagnosis not present

## 2018-02-11 DIAGNOSIS — R739 Hyperglycemia, unspecified: Secondary | ICD-10-CM | POA: Diagnosis not present

## 2018-02-11 DIAGNOSIS — E876 Hypokalemia: Secondary | ICD-10-CM | POA: Diagnosis not present

## 2018-02-11 DIAGNOSIS — I10 Essential (primary) hypertension: Secondary | ICD-10-CM

## 2018-02-11 NOTE — Assessment & Plan Note (Signed)
stable overall by history and exam, recent data reviewed with pt, and pt to continue medical treatment as before,  to f/u any worsening symptoms or concerns Lab Results  Component Value Date   HGBA1C 5.5 02/06/2018

## 2018-02-11 NOTE — Patient Instructions (Signed)
Please continue all other medications as before, and refills have been done if requested.  Please have the pharmacy call with any other refills you may need.  Please continue your efforts at being more active, low cholesterol diet, and weight control.  You are otherwise up to date with prevention measures today.  Please keep your appointments with your specialists as you may have planned  Please return in 6 months, or sooner if needed 

## 2018-02-11 NOTE — Assessment & Plan Note (Signed)
stable overall by history and exam, recent data reviewed with pt, and pt to continue medical treatment as before,  to f/u any worsening symptoms or concerns BP Readings from Last 3 Encounters:  02/11/18 136/88  02/09/18 (!) 121/93  01/26/18 (!) 187/123

## 2018-02-11 NOTE — Progress Notes (Signed)
Subjective:    Patient ID: Steven Chase, male    DOB: 1986/08/17, 32 y.o.   MRN: [id number]  HPI  Here to f/u with mother present for recent hospn 4/5-10 - 33 year old male with hypertension, sleep apnea, obesity, who came to the hospital on 4/5 due to shortness of breath and chest pain of rather sudden onset while being at work. He also had noticed progressive lower extremity swelling over the last few days to weeks. On arrival to ED he was found to be in hypertensive urgency, he also had significant fluid overload on chest x-ray, d-dimer was normal, troponin was normal and BNP was elevated. He was started on nitroglycerin infusion, Lasix and was admitted to stepdown.  Patient evaluated by cardiology after admission.  He underwent cardiac cath which showed no significant coronary obstruction suggesting nonischemic cardiomyopathy.  Patient was started on digoxin, Entresto, Spironolactone.  Also underwent cardiac MRI which showed ejection fraction of 15% with diffuse hypokinesis. Today, Pt denies chest pain, increased sob or doe, wheezing, orthopnea, PND, increased LE swelling, palpitations, dizziness or syncope, though admits to not taking any entresto since d/c due to cost - $600 with his plan   Pt denies fever, wt loss, night sweats, loss of appetite, or other constitutional symptoms  Pt denies polydipsia, polyuria.    No other interval hx or new complaints Past Medical History:  Diagnosis Date  . Hypertension   . Obesity   . OSA (obstructive sleep apnea) 11/05/2014   02/2015 -severe -AHI 106/h    Past Surgical History:  Procedure Laterality Date  . HERNIA REPAIR    . RIGHT/LEFT HEART CATH AND CORONARY ANGIOGRAPHY N/A 02/07/2018   Procedure: RIGHT/LEFT HEART CATH AND CORONARY ANGIOGRAPHY;  Surgeon: Marykay Lex, MD;  Location: [facility] INVASIVE CV LAB;  Service: Cardiovascular;  Laterality: N/A;    reports that he has never smoked. He has never used smokeless tobacco. He reports that he does  not drink alcohol or use drugs. family history includes Heart disease in his maternal grandfather; Hypertension in his maternal grandfather, mother, and sister. No Known Allergies Current Outpatient Medications on File Prior to Visit  Medication Sig Dispense Refill  . digoxin (LANOXIN) 0.125 MG tablet Take 1 tablet (0.125 mg total) by mouth daily. 30 tablet 0  . potassium chloride SA (K-DUR,KLOR-CON) 20 MEQ tablet Take 2 tablets (40 mEq total) by mouth daily. 14 tablet 0  . sacubitril-valsartan (ENTRESTO) 97-103 MG Take 1 tablet by mouth 2 (two) times daily. 60 tablet 0  . spironolactone (ALDACTONE) 25 MG tablet Take 1 tablet (25 mg total) by mouth daily. 30 tablet 0   No current facility-administered medications on file prior to visit.    Review of Systems  Constitutional: Negative for other unusual diaphoresis or sweats HENT: Negative for ear discharge or swelling Eyes: Negative for other worsening visual disturbances Respiratory: Negative for stridor or other swelling  Gastrointestinal: Negative for worsening distension or other blood Genitourinary: Negative for retention or other urinary change Musculoskeletal: Negative for other MSK pain or swelling Skin: Negative for color change or other new lesions Neurological: Negative for worsening tremors and other numbness  Psychiatric/Behavioral: Negative for worsening agitation or other fatigue All other system neg per pt    Objective:   Physical Exam BP 136/88   Pulse 68   Temp 97.8 F (36.6 C) (Oral)   Ht 5\' 6"  (1.676 m)   Wt 224 lb (101.6 kg)   SpO2 96%   BMI 36.15 kg/m  VS noted,  Constitutional: Pt appears in NAD HENT: Head: NCAT.  Right Ear: External ear normal.  Left Ear: External ear normal.  Eyes: . Pupils are equal, round, and reactive to light. Conjunctivae and EOM are normal Nose: without d/c or deformity Neck: Neck supple. Gross normal ROM Cardiovascular: Normal rate and regular rhythm.   Pulmonary/Chest:  Effort normal and breath sounds without rales or wheezing.  Neurological: Pt is alert. At baseline orientation, motor grossly intact Skin: Skin is warm. No rashes, other new lesions, no LE edema Psychiatric: Pt behavior is normal without agitation  No other exam findings    Assessment & Plan:

## 2018-02-11 NOTE — Assessment & Plan Note (Signed)
Overall stable 2 days post hosp dc but not taking the entresto; mother will go with him directly to cardiology office this AM to inquire about med change to affordable med before the weekend; has f/u planned apr 18 with CHF clinic

## 2018-02-11 NOTE — Assessment & Plan Note (Signed)
Would likely need f/u lab at next chf clinic visit Lab Results  Component Value Date   K 3.7 02/09/2018

## 2018-02-16 NOTE — Progress Notes (Signed)
Advanced Heart Failure Clinic Note   PCP: Corwin Levins, MD PCP-Cardiologist: Kristeen Miss, MD   HPI:  Steven Chase is a 32 y.o. male with chronic systolic, NICM by cath, HTN, OSA, and obesity.   Pt admitted 4/5 - 02/09/18 with non-radiating R sided chest pain, SOB and diaphoresis. Echo with newly reduced EF so taken for cath which showed marginal cardiac output and normal coronaries. cMRI showed small nodule questionable for sarcoidosis, but non-specific. Diuresed and meds adjusted as tolerated.  Suspected to be primarily HTN cardiomyopathy.   He presents today for post hospital follow up. Feeling good since hospitalization. He denies SOB or any further CP.  He has not been lightheaded or dizzy on his new meds.  Denies peripheral edema or orthopnea. He is taking all medication as directed, though took his last potassium yesterday. He would like to start doing cardio with his friend.  Trying to watch salt and fluid.   Echo 02/05/2018 LVEF 15%, Grade 2 DD, Trivial AI, Mild MR, Mild LAE, PA peak pressure 38 mm Hg, small pericardial effusion noted.  L/RHC 02/07/18 - Normal Coronaries RHC Procedural Findings: Hemodynamics (mmHg) RA mean 9 RV 39/8 PA 34/24 (29) PCWP 23 AO 95/68 Cardiac Output (Fick) 3.34 Cardiac Index (Fick) 1.61  Echo 02/05/2018 LVEF 15%, Grade 2 DD, Trivial AI, Mild MR, Mild LAE, PA peak pressure 38 mm Hg, small pericardial effusion noted  cMRI 02/08/18 1. Moderately dilated LV with moderate concentric LV hypertrophy. EF 19% with diffuse hypokinesis. 2. Normal RV size and systolic function. 3. On delayed enhancement, there was nodular transmural LGE in a small area of the mid inferior wall as described above. There was a subepicardial extension of the LGE nodule. This does not appear to be a coronary disease pattern as it was very discrete. Cardiac sarcoidosis is a concern with the discrete nodule, though LGE is more typically basal septal or inferolateral. LGE is  not as diffuse as one would expect with myocarditis. The pattern is not suggestive of amyloidosis.  Review of systems complete and found to be negative unless listed in HPI.    Past Medical History:  Diagnosis Date  . Hypertension   . Obesity   . OSA (obstructive sleep apnea) 11/05/2014   02/2015 -severe -AHI 106/h     Current Outpatient Medications  Medication Sig Dispense Refill  . digoxin (LANOXIN) 0.125 MG tablet Take 1 tablet (0.125 mg total) by mouth daily. 30 tablet 0  . potassium chloride SA (K-DUR,KLOR-CON) 20 MEQ tablet Take 2 tablets (40 mEq total) by mouth daily. 14 tablet 0  . sacubitril-valsartan (ENTRESTO) 97-103 MG Take 1 tablet by mouth 2 (two) times daily. 60 tablet 0  . spironolactone (ALDACTONE) 25 MG tablet Take 1 tablet (25 mg total) by mouth daily. 30 tablet 0   No current facility-administered medications for this visit.     No Known Allergies    Social History   Socioeconomic History  . Marital status: Single    Spouse name: Not on file  . Number of children: 1  . Years of education: 1  . Highest education level: Not on file  Occupational History  . Occupation: Therapist, music: [employer]  Social Needs  . Financial resource strain: Not on file  . Food insecurity:    Worry: Not on file    Inability: Not on file  . Transportation needs:    Medical: Not on file    Non-medical: Not on file  Tobacco Use  . Smoking status: Never Smoker  . Smokeless tobacco: Never Used  Substance and Sexual Activity  . Alcohol use: No  . Drug use: No  . Sexual activity: Not on file  Lifestyle  . Physical activity:    Days per week: Not on file    Minutes per session: Not on file  . Stress: Not on file  Relationships  . Social connections:    Talks on phone: Not on file    Gets together: Not on file    Attends religious service: Not on file    Active member of club or organization: Not on file    Attends meetings of clubs or organizations:  Not on file    Relationship status: Not on file  . Intimate partner violence:    Fear of current or ex partner: Not on file    Emotionally abused: Not on file    Physically abused: Not on file    Forced sexual activity: Not on file  Other Topics Concern  . Not on file  Social History Narrative   Born and raised in [place], [place]. Currently live in a private residence with his mother. Fun: Play with his daughter.   Denies religious beliefs that would effect health care.    Family History  Problem Relation Age of Onset  . Hypertension Mother   . Heart disease Maternal Grandfather   . Hypertension Maternal Grandfather   . Hypertension Sister     Vitals:   02/17/18 1011  BP: (!) 168/110  Pulse: 98  SpO2: 94%  Weight: 222 lb 3.2 oz (100.8 kg)    Wt Readings from Last 3 Encounters:  02/17/18 222 lb 3.2 oz (100.8 kg)  02/11/18 224 lb (101.6 kg)  02/09/18 219 lb 12.8 oz (99.7 kg)    PHYSICAL EXAM: General:  Well appearing. No respiratory difficulty HEENT: normal Neck: supple. no JVD. Carotids 2+ bilat; no bruits. No lymphadenopathy or thyromegaly appreciated. Cor: PMI nondisplaced. Regular rate & rhythm. No rubs, gallops or murmurs. Lungs: clear Abdomen: soft, nontender, nondistended. No hepatosplenomegaly. No bruits or masses. Good bowel sounds. Extremities: no cyanosis, clubbing, rash, edema Neuro: alert & oriented x 3, cranial nerves grossly intact. moves all 4 extremities w/o difficulty. Affect pleasant.  ASSESSMENT & PLAN:  1. Chronic systolic CHF - NICM by cath 02/07/18, with low cardiac output -Echo4/04/2018 LVEF 15%, Grade 2 DD, Trivial AI, Mild MR, Mild LAE, PA peak pressure 38 mm Hg, small pericardial effusion noted. - cMRI 02/08/18 LVEF 19%, Normal RV, Discrete nodule in mid inferior wall concerning for possible Sarcoid involvement. No amyloid.  - NYHA II currently.  - Volume status stable off lasix.  - Continue Entresto 97/103 mg BID.  - Hold off BB in setting of  marginal cardiac output.  - Start Bidil 0.5 mg tab TID.  - Continue digoxin 0.125 mg daily. Check level today.  - Continue spiro 25 mg daily. - Hgb A1C 5.5, TSH normal, AST mildly elevated, HIV negative. Hepatitis panel negative.  - Reinforced fluid restriction to < 2 L daily, sodium restriction to less than 2000 mg daily, and the importance of daily weights.     2. Chest discomfort - Atypical. CXR with edema. Cath w/o obstructive CAD.  - No s/s of ischemia.    - Troponin peaked at 1.68. - D-Dimer negative recent admission.   3. HTN - Meds as above.   4. OSA, previous diagnosis - Non-compliant with CPAP.  - Will send for  sleep study.   5. CKD II - Baseline Cr 1.2 - 1.4. - BMET today.  6. Hypokalemia - BMET today.   7. Obesity - Body mass index is 35.86 kg/m.  - Encouraged weight loss and exercise.  Graciella Freer, PA-C 02/16/18   Greater than 50% of the 25 minute visit was spent in counseling/coordination of care regarding disease state education, salt/fluid restriction, sliding scale diuretics, and medication compliance.

## 2018-02-17 ENCOUNTER — Encounter (HOSPITAL_COMMUNITY): Payer: Self-pay

## 2018-02-17 ENCOUNTER — Ambulatory Visit (HOSPITAL_COMMUNITY)
Admit: 2018-02-17 | Discharge: 2018-02-17 | Disposition: A | Discharge status: Home / Self Care | Payer: BLUE CROSS/BLUE SHIELD | Attending: Internal Medicine | Admitting: Internal Medicine

## 2018-02-17 ENCOUNTER — Telehealth (HOSPITAL_COMMUNITY): Payer: Self-pay | Admitting: *Deleted

## 2018-02-17 VITALS — BP 168/110 | HR 98 | Wt 222.2 lb

## 2018-02-17 DIAGNOSIS — I429 Cardiomyopathy, unspecified: Secondary | ICD-10-CM

## 2018-02-17 DIAGNOSIS — G4733 Obstructive sleep apnea (adult) (pediatric): Secondary | ICD-10-CM | POA: Diagnosis not present

## 2018-02-17 DIAGNOSIS — Z9119 Patient's noncompliance with other medical treatment and regimen: Secondary | ICD-10-CM | POA: Insufficient documentation

## 2018-02-17 DIAGNOSIS — Z6839 Body mass index (BMI) 39.0-39.9, adult: Secondary | ICD-10-CM

## 2018-02-17 DIAGNOSIS — I10 Essential (primary) hypertension: Secondary | ICD-10-CM | POA: Diagnosis not present

## 2018-02-17 DIAGNOSIS — Z6835 Body mass index (BMI) 35.0-35.9, adult: Secondary | ICD-10-CM | POA: Diagnosis not present

## 2018-02-17 DIAGNOSIS — R0789 Other chest pain: Secondary | ICD-10-CM | POA: Diagnosis not present

## 2018-02-17 DIAGNOSIS — E876 Hypokalemia: Secondary | ICD-10-CM | POA: Diagnosis not present

## 2018-02-17 DIAGNOSIS — N182 Chronic kidney disease, stage 2 (mild): Secondary | ICD-10-CM | POA: Diagnosis not present

## 2018-02-17 DIAGNOSIS — R5383 Other fatigue: Secondary | ICD-10-CM | POA: Diagnosis not present

## 2018-02-17 DIAGNOSIS — I130 Hypertensive heart and chronic kidney disease with heart failure and stage 1 through stage 4 chronic kidney disease, or unspecified chronic kidney disease: Secondary | ICD-10-CM | POA: Diagnosis not present

## 2018-02-17 DIAGNOSIS — Z79899 Other long term (current) drug therapy: Secondary | ICD-10-CM | POA: Insufficient documentation

## 2018-02-17 DIAGNOSIS — I5022 Chronic systolic (congestive) heart failure: Secondary | ICD-10-CM | POA: Diagnosis not present

## 2018-02-17 DIAGNOSIS — R0602 Shortness of breath: Secondary | ICD-10-CM | POA: Diagnosis not present

## 2018-02-17 DIAGNOSIS — E669 Obesity, unspecified: Secondary | ICD-10-CM | POA: Insufficient documentation

## 2018-02-17 LAB — BASIC METABOLIC PANEL
ANION GAP: 11 (ref 5–15)
BUN: 15 mg/dL (ref 6–20)
CHLORIDE: 102 mmol/L (ref 101–111)
CO2: 23 mmol/L (ref 22–32)
Calcium: 9.5 mg/dL (ref 8.9–10.3)
Creatinine, Ser: 1.22 mg/dL (ref 0.61–1.24)
GFR calc Af Amer: >60 mL/min (ref >60)
GFR calc non Af Amer: >60 mL/min (ref >60)
GLUCOSE: 96 mg/dL (ref 65–99)
POTASSIUM: 4.5 mmol/L (ref 3.5–5.1)
Sodium: 136 mmol/L (ref 135–145)

## 2018-02-17 LAB — DIGOXIN LEVEL: DIGOXIN LVL: 0.7 ng/mL — AB (ref 0.8–2.0)

## 2018-02-17 MED ORDER — ISOSORB DINITRATE-HYDRALAZINE 20-37.5 MG PO TABS: 0.5000 | ORAL_TABLET | Freq: Three times a day (TID) | ORAL | 11 refills | Status: DC

## 2018-02-17 MED ORDER — POTASSIUM CHLORIDE CRYS ER 20 MEQ PO TBCR
40.0000 meq | EXTENDED_RELEASE_TABLET | Freq: Every day | ORAL | 11 refills | Status: DC
Start: 2018-02-17 — End: 2018-11-28

## 2018-02-17 NOTE — Telephone Encounter (Signed)
Entresto 97-103 PA approved by [facility] from 02/17/18-11/01/2038.

## 2018-02-17 NOTE — Patient Instructions (Signed)
Refilled Potassium.  START Bidil 1.2 tablet three times daily. Set alarm on phone as a reminder every 8 hours.  Routine lab work today. Will notify you of abnormal results, otherwise no news is good news!  Will schedule you for sleep study at [facility]. Address: [address] [place], [place], [place] [address] Phone: [phone]  Follow up 4 weeks with Otilio Saber PA-C.  Take all medication as prescribed the day of your appointment. Bring all medications with you to your appointment.  Do the following things EVERYDAY: 1) Weigh yourself in the morning before breakfast. Write it down and keep it in a log. 2) Take your medicines as prescribed 3) Eat low salt foods-Limit salt (sodium) to 2000 mg per day.  4) Stay as active as you can everyday 5) Limit all fluids for the day to less than 2 liters

## 2018-02-18 ENCOUNTER — Encounter (HOSPITAL_COMMUNITY): Payer: Self-pay

## 2018-02-18 ENCOUNTER — Telehealth (HOSPITAL_COMMUNITY): Payer: Self-pay | Admitting: *Deleted

## 2018-02-18 ENCOUNTER — Ambulatory Visit (HOSPITAL_COMMUNITY)
Admission: EM | Admit: 2018-02-18 | Discharge: 2018-02-18 | Disposition: A | Discharge status: Home / Self Care | Payer: BLUE CROSS/BLUE SHIELD | Attending: Internal Medicine | Admitting: Internal Medicine

## 2018-02-18 ENCOUNTER — Other Ambulatory Visit: Payer: Self-pay

## 2018-02-18 DIAGNOSIS — L309 Dermatitis, unspecified: Secondary | ICD-10-CM | POA: Diagnosis not present

## 2018-02-18 MED ORDER — TRIAMCINOLONE ACETONIDE 0.1 % EX CREA: 1.0000 "application " | TOPICAL_CREAM | Freq: Two times a day (BID) | CUTANEOUS | 0 refills | Status: DC

## 2018-02-18 NOTE — Telephone Encounter (Signed)
PA submitted on cover my meds on 02/18/18. Waiting for decision can take up to 3 business days.

## 2018-02-18 NOTE — ED Triage Notes (Signed)
Pt presents with rash to right upper arm x 1 day

## 2018-02-18 NOTE — Discharge Instructions (Addendum)
Rash could be due to irritation from something you got on your skin, or from something else.  Does not sound like poison ivy since you have not been working in the yard.  Prescription for triamcinolone cream (steroid) was sent to the pharmacy.  Anticipate gradual improvement over the next week or two.  If not improving as expected, or if increasing itching in more body sites (skin folds) would recheck for possibility of scabies.

## 2018-02-18 NOTE — ED Provider Notes (Signed)
[facility]    CSN: [id number] Arrival date & time: 02/18/18  1120     History   Chief Complaint Chief Complaint  Patient presents with  . Rash    HPI Steven Chase is a 32 y.o. male.   He presents today with an itchy patch in side his right elbow for the last 1-2 days.  He also has an itchy patch in the right lower abdomen, in the skin fold, present for about the same time.Marland Kitchen  His daughter told him it might be poison ivy.  He wonders about some prednisone.  No history of new skin products, laundry soap, no history of skin sensitivity/eczema.  Feels fine otherwise. No other itching members of the household.  Has not been outside, has not worked in the yard, does not have pets.  HPI  Past Medical History:  Diagnosis Date  . Hypertension   . Obesity   . OSA (obstructive sleep apnea) 11/05/2014   02/2015 -severe -AHI 106/h     Patient Active Problem List   Diagnosis Date Noted  . Chronic systolic heart failure (HCC) 02/17/2018  . Fatigue 02/17/2018  . Cardiomyopathy (HCC) 02/11/2018  . Hypertensive crisis 02/04/2018  . Chest pain 02/04/2018  . CKD (chronic kidney disease), stage II 02/04/2018  . Allergic rhinitis 01/21/2018  . Hyperglycemia 12/02/2017  . Hypokalemia 12/02/2017  . Cough 12/02/2017  . Patellar tendonitis of left knee 12/02/2017  . Encounter for well adult exam with abnormal findings 12/10/2016  . Obesity 05/25/2016  . Lower leg edema 12/12/2014  . Essential hypertension 11/05/2014  . OSA (obstructive sleep apnea) 11/05/2014    Past Surgical History:  Procedure Laterality Date  . HERNIA REPAIR    . RIGHT/LEFT HEART CATH AND CORONARY ANGIOGRAPHY N/A 02/07/2018   Procedure: RIGHT/LEFT HEART CATH AND CORONARY ANGIOGRAPHY;  Surgeon: Marykay Lex, MD;  Location: [facility] INVASIVE CV LAB;  Service: Cardiovascular;  Laterality: N/A;       Home Medications    Prior to Admission medications   Medication Sig Start Date End Date Taking? Authorizing  Provider  digoxin (LANOXIN) 0.125 MG tablet Take 1 tablet (0.125 mg total) by mouth daily. 02/10/18  Yes Adhikari, Willia Craze, MD  isosorbide-hydrALAZINE (BIDIL) 20-37.5 MG tablet Take 0.5 tablets by mouth 3 (three) times daily. 02/17/18  Yes Graciella Freer, PA-C  potassium chloride SA (K-DUR,KLOR-CON) 20 MEQ tablet Take 2 tablets (40 mEq total) by mouth daily. 02/17/18  Yes Graciella Freer, PA-C  sacubitril-valsartan (ENTRESTO) 97-103 MG Take 1 tablet by mouth 2 (two) times daily. 02/09/18  Yes Burnadette Pop, MD  spironolactone (ALDACTONE) 25 MG tablet Take 1 tablet (25 mg total) by mouth daily. 02/10/18  Yes Burnadette Pop, MD  triamcinolone cream (KENALOG) 0.1 % Apply 1 application topically 2 (two) times daily. 02/18/18   Isa Rankin, MD    Family History Family History  Problem Relation Age of Onset  . Hypertension Mother   . Heart disease Maternal Grandfather   . Hypertension Maternal Grandfather   . Hypertension Sister     Social History Social History   Tobacco Use  . Smoking status: Never Smoker  . Smokeless tobacco: Never Used  Substance Use Topics  . Alcohol use: No  . Drug use: No     Allergies   Patient has no known allergies.   Review of Systems Review of Systems  All other systems reviewed and are negative.    Physical Exam Triage Vital Signs ED Triage Vitals  Enc Vitals Group     BP 02/18/18 1311 (!) 155/84     Pulse Rate 02/18/18 1311 (!) 50     Resp 02/18/18 1311 18     Temp 02/18/18 1310 97.6 F (36.4 C)     Temp src --      SpO2 02/18/18 1311 100 %     Weight 02/18/18 1310 222 lb (100.7 kg)     Height --      Pain Score 02/18/18 1310 0     Pain Loc --    Updated Vital Signs BP (!) 155/84   Pulse (!) 50   Temp 97.6 F (36.4 C)   Resp 18   Wt 222 lb (100.7 kg)   SpO2 100%   BMI 35.83 kg/m  Physical Exam  Constitutional: He is oriented to person, place, and time. No distress.  Alert, nicely groomed  HENT:  Head:  Atraumatic.  Eyes:  Conjugate gaze, no eye redness/drainage  Neck: Neck supple.  Cardiovascular: Normal rate.  Pulmonary/Chest: No respiratory distress.  Lungs clear, symmetric breath sounds  Abdominal: Soft. He exhibits no distension. There is no tenderness. There is no guarding.  Musculoskeletal: Normal range of motion.  Neurological: He is alert and oriented to person, place, and time.  Skin: Skin is warm and dry.  No cyanosis Flexor aspect of the right elbow with a 3 inch excoriated patch, flat patchy erythema Similar patch, probably 4 x 5 inches, in the right lower abdomen under the abdominal protuberance  Nursing note and vitals reviewed.    UC Treatments / Results  Labs (all labs ordered are listed, but only abnormal results are displayed) Labs Reviewed - No data to display  EKG None Radiology No results found.  Procedures Procedures (including critical care time) None today  Final Clinical Impressions(s) / UC Diagnoses   Final diagnoses:  Dermatitis   Rash could be due to irritation from something you got on your skin, or from something else.  Does not sound like poison ivy since you have not been working in the yard.  Prescription for triamcinolone cream (steroid) was sent to the pharmacy.  Anticipate gradual improvement over the next week or two.  If not improving as expected, or if increasing itching in more body sites (skin folds) would recheck for possibility of scabies.    ED Discharge Orders        Ordered    triamcinolone cream (KENALOG) 0.1 %  2 times daily     02/18/18 1339        Isa Rankin, MD 02/21/18 2125

## 2018-02-21 NOTE — Telephone Encounter (Signed)
PA approved#[id number] Effective 02/18/18-02/16/21

## 2018-02-21 NOTE — Telephone Encounter (Signed)
Bidil approved

## 2018-03-11 ENCOUNTER — Other Ambulatory Visit (HOSPITAL_COMMUNITY): Payer: Self-pay | Admitting: *Deleted

## 2018-03-11 MED ORDER — SPIRONOLACTONE 25 MG PO TABS: 25.0000 mg | ORAL_TABLET | Freq: Every day | ORAL | 3 refills | Status: DC

## 2018-03-11 MED ORDER — DIGOXIN 125 MCG PO TABS: 0.1250 mg | ORAL_TABLET | Freq: Every day | ORAL | 3 refills | Status: DC

## 2018-03-16 ENCOUNTER — Ambulatory Visit (HOSPITAL_BASED_OUTPATIENT_CLINIC_OR_DEPARTMENT_OTHER): Payer: BLUE CROSS/BLUE SHIELD | Attending: Student | Admitting: Cardiology

## 2018-03-16 VITALS — Ht 66.0 in | Wt 219.0 lb

## 2018-03-16 DIAGNOSIS — R5383 Other fatigue: Secondary | ICD-10-CM

## 2018-03-16 DIAGNOSIS — G4733 Obstructive sleep apnea (adult) (pediatric): Secondary | ICD-10-CM | POA: Diagnosis not present

## 2018-03-16 DIAGNOSIS — E669 Obesity, unspecified: Secondary | ICD-10-CM | POA: Diagnosis not present

## 2018-03-16 DIAGNOSIS — Z6835 Body mass index (BMI) 35.0-35.9, adult: Secondary | ICD-10-CM | POA: Diagnosis not present

## 2018-03-16 DIAGNOSIS — I10 Essential (primary) hypertension: Secondary | ICD-10-CM | POA: Insufficient documentation

## 2018-03-16 DIAGNOSIS — R0683 Snoring: Secondary | ICD-10-CM | POA: Insufficient documentation

## 2018-03-16 DIAGNOSIS — G4736 Sleep related hypoventilation in conditions classified elsewhere: Secondary | ICD-10-CM | POA: Insufficient documentation

## 2018-03-17 ENCOUNTER — Ambulatory Visit (HOSPITAL_COMMUNITY)
Admission: RE | Admit: 2018-03-17 | Discharge: 2018-03-17 | Disposition: A | Discharge status: Home / Self Care | Payer: BLUE CROSS/BLUE SHIELD | Source: Ambulatory Visit | Attending: Internal Medicine | Admitting: Internal Medicine

## 2018-03-17 VITALS — BP 158/90 | HR 80 | Wt 223.0 lb

## 2018-03-17 DIAGNOSIS — R079 Chest pain, unspecified: Secondary | ICD-10-CM | POA: Diagnosis not present

## 2018-03-17 DIAGNOSIS — G4733 Obstructive sleep apnea (adult) (pediatric): Secondary | ICD-10-CM

## 2018-03-17 DIAGNOSIS — I10 Essential (primary) hypertension: Secondary | ICD-10-CM | POA: Diagnosis not present

## 2018-03-17 DIAGNOSIS — R0789 Other chest pain: Secondary | ICD-10-CM | POA: Diagnosis not present

## 2018-03-17 DIAGNOSIS — I429 Cardiomyopathy, unspecified: Secondary | ICD-10-CM | POA: Insufficient documentation

## 2018-03-17 DIAGNOSIS — E669 Obesity, unspecified: Secondary | ICD-10-CM | POA: Diagnosis not present

## 2018-03-17 DIAGNOSIS — Z8249 Family history of ischemic heart disease and other diseases of the circulatory system: Secondary | ICD-10-CM | POA: Diagnosis not present

## 2018-03-17 DIAGNOSIS — Z6835 Body mass index (BMI) 35.0-35.9, adult: Secondary | ICD-10-CM | POA: Insufficient documentation

## 2018-03-17 DIAGNOSIS — Z79899 Other long term (current) drug therapy: Secondary | ICD-10-CM | POA: Diagnosis not present

## 2018-03-17 DIAGNOSIS — E876 Hypokalemia: Secondary | ICD-10-CM | POA: Insufficient documentation

## 2018-03-17 DIAGNOSIS — Z6839 Body mass index (BMI) 39.0-39.9, adult: Secondary | ICD-10-CM | POA: Diagnosis not present

## 2018-03-17 DIAGNOSIS — N182 Chronic kidney disease, stage 2 (mild): Secondary | ICD-10-CM | POA: Insufficient documentation

## 2018-03-17 DIAGNOSIS — Z9119 Patient's noncompliance with other medical treatment and regimen: Secondary | ICD-10-CM | POA: Diagnosis not present

## 2018-03-17 DIAGNOSIS — I130 Hypertensive heart and chronic kidney disease with heart failure and stage 1 through stage 4 chronic kidney disease, or unspecified chronic kidney disease: Secondary | ICD-10-CM | POA: Diagnosis not present

## 2018-03-17 DIAGNOSIS — I5022 Chronic systolic (congestive) heart failure: Secondary | ICD-10-CM | POA: Diagnosis not present

## 2018-03-17 MED ORDER — CARVEDILOL 3.125 MG PO TABS: 3.1250 mg | ORAL_TABLET | Freq: Two times a day (BID) | ORAL | 11 refills | Status: DC

## 2018-03-17 MED ORDER — ISOSORB DINITRATE-HYDRALAZINE 20-37.5 MG PO TABS: 1.0000 | ORAL_TABLET | Freq: Three times a day (TID) | ORAL | 11 refills | Status: DC

## 2018-03-17 NOTE — Patient Instructions (Addendum)
INCREASE Bidil to 1 whole tablet three times daily.  START Carvedilol (Coreg) 3.125 mg tablet twice daily.  Follow up 4 weeks with pharmacist.  ____________________________________________________ [place] Code: 1300  Follow up 8 weeks with Duwaine Maxin NP-C.  ____________________________________________________ [place] Code: 1400  Follow up 4 months with echocardiogram and appointment with Dr. Gala Romney.  ______________________________________________________ [place] Code: 1600  Take all medication as prescribed the day of your appointment. Bring all medications with you to your appointment.  Do the following things EVERYDAY: 1) Weigh yourself in the morning before breakfast. Write it down and keep it in a log. 2) Take your medicines as prescribed 3) Eat low salt foods-Limit salt (sodium) to 2000 mg per day.  4) Stay as active as you can everyday 5) Limit all fluids for the day to less than 2 liters

## 2018-03-17 NOTE — Progress Notes (Signed)
Advanced Heart Failure Clinic Note   PCP: Corwin Levins, MD PCP-Cardiologist: Kristeen Miss, MD  HF: Dr Gala Romney   HPI:  Steven Chase is a 32 y.o. male with chronic systolic, NICM by cath, HTN, OSA, and obesity.   Pt admitted 4/5 - 02/09/18 with non-radiating R sided chest pain, SOB and diaphoresis. Echo with newly reduced EF so taken for cath which showed marginal cardiac output and normal coronaries. cMRI showed small nodule questionable for sarcoidosis, but non-specific. Diuresed and meds adjusted as tolerated.  Suspected to be primarily HTN cardiomyopathy.   Pt presents today for HF follow up. Last visit, he was started on 1/2 tab Bidil TID. Overall he is doing well. He is walking around the neighborhood daily with no problems. Denies SOB with stairs or hills. Denies orthopnea, PND, or edema. Denies CP or dizziness. He has a good appetite. He had his sleep study last night. Weights at home 219-220 lbs. SBP 150s at home. Compliant with medications. Limiting fluid and salt.   Echo 02/05/2018 LVEF 15%, Grade 2 DD, Trivial AI, Mild MR, Mild LAE, PA peak pressure 38 mm Hg, small pericardial effusion noted.  L/RHC 02/07/18 - Normal Coronaries [facility] Procedural Findings: Hemodynamics (mmHg) RA mean 9 RV 39/8 PA 34/24 (29) PCWP 23 AO 95/68 Cardiac Output (Fick) 3.34 Cardiac Index (Fick) 1.61  Echo 02/05/2018 LVEF 15%, Grade 2 DD, Trivial AI, Mild MR, Mild LAE, PA peak pressure 38 mm Hg, small pericardial effusion noted  cMRI 02/08/18 1. Moderately dilated LV with moderate concentric LV hypertrophy. EF 19% with diffuse hypokinesis. 2. Normal RV size and systolic function. 3. On delayed enhancement, there was nodular transmural LGE in a small area of the mid inferior wall as described above. There was a subepicardial extension of the LGE nodule. This does not appear to be a coronary disease pattern as it was very discrete. Cardiac sarcoidosis is a concern with the discrete nodule, though  LGE is more typically basal septal or inferolateral. LGE is not as diffuse as one would expect with myocarditis. The pattern is not suggestive of amyloidosis.  Review of systems complete and found to be negative unless listed in HPI.   Past Medical History:  Diagnosis Date  . Hypertension   . Obesity   . OSA (obstructive sleep apnea) 11/05/2014   02/2015 -severe -AHI 106/h     Current Outpatient Medications  Medication Sig Dispense Refill  . digoxin (LANOXIN) 0.125 MG tablet Take 1 tablet (0.125 mg total) by mouth daily. 30 tablet 3  . isosorbide-hydrALAZINE (BIDIL) 20-37.5 MG tablet Take 0.5 tablets by mouth 3 (three) times daily. 45 tablet 11  . potassium chloride SA (K-DUR,KLOR-CON) 20 MEQ tablet Take 2 tablets (40 mEq total) by mouth daily. 60 tablet 11  . sacubitril-valsartan (ENTRESTO) 97-103 MG Take 1 tablet by mouth 2 (two) times daily. 60 tablet 0  . spironolactone (ALDACTONE) 25 MG tablet Take 1 tablet (25 mg total) by mouth daily. 30 tablet 3  . triamcinolone cream (KENALOG) 0.1 % Apply 1 application topically 2 (two) times daily. 45 g 0   No current facility-administered medications for this encounter.     No Known Allergies    Social History   Socioeconomic History  . Marital status: Single    Spouse name: Not on file  . Number of children: 1  . Years of education: 12  . Highest education level: Not on file  Occupational History  . Occupation: Nurse, learning disability AND  [employer]  Social Needs  . Financial resource strain: Not on file  . Food insecurity:    Worry: Not on file    Inability: Not on file  . Transportation needs:    Medical: Not on file    Non-medical: Not on file  Tobacco Use  . Smoking status: Never Smoker  . Smokeless tobacco: Never Used  Substance and Sexual Activity  . Alcohol use: No  . Drug use: No  . Sexual activity: Not on file  Lifestyle  . Physical activity:    Days per week: Not on file    Minutes per session: Not on  file  . Stress: Not on file  Relationships  . Social connections:    Talks on phone: Not on file    Gets together: Not on file    Attends religious service: Not on file    Active member of club or organization: Not on file    Attends meetings of clubs or organizations: Not on file    Relationship status: Not on file  . Intimate partner violence:    Fear of current or ex partner: Not on file    Emotionally abused: Not on file    Physically abused: Not on file    Forced sexual activity: Not on file  Other Topics Concern  . Not on file  Social History Narrative   Born and raised in [place], [place]. Currently live in a private residence with his mother. Fun: Play with his daughter.   Denies religious beliefs that would effect health care.    Family History  Problem Relation Age of Onset  . Hypertension Mother   . Heart disease Maternal Grandfather   . Hypertension Maternal Grandfather   . Hypertension Sister     Vitals:   03/17/18 0836  BP: (!) 158/90  Pulse: 80  SpO2: 96%  Weight: 223 lb (101.2 kg)    Wt Readings from Last 3 Encounters:  03/17/18 223 lb (101.2 kg)  03/16/18 219 lb (99.3 kg)  02/18/18 222 lb (100.7 kg)    PHYSICAL EXAM: General: Well appearing. No resp difficulty. HEENT: Normal Neck: Supple. JVP flat. Carotids 2+ bilat; no bruits. No thyromegaly or nodule noted. Cor: PMI nondisplaced. RRR, No M/G/R noted Lungs: CTAB, normal effort. Abdomen: Soft, non-tender, non-distended, no HSM. No bruits or masses. +BS  Extremities: No cyanosis, clubbing, or rash. R and LLE no edema.  Neuro: Alert & orientedx3, cranial nerves grossly intact. moves all 4 extremities w/o difficulty. Affect pleasant  EKG: Sinus tach 103. Personally reviewed.   ASSESSMENT & PLAN:  1. Chronic systolic CHF - NICM by cath 02/07/18, with low cardiac output. Suspected primarily HTN cardiomyopathy -Echo4/04/2018 LVEF 15%, Grade 2 DD, Trivial AI, Mild MR, Mild LAE, PA peak pressure 38 mm  Hg, small pericardial effusion noted. - cMRI 02/08/18 LVEF 19%, Normal RV, Discrete nodule in mid inferior wall concerning for possible Sarcoid involvement. No amyloid.  - Hgb A1C 5.5, TSH normal, AST mildly elevated, HIV negative. Hepatitis panel negative.  - NYHA II currently.  - Volume status stable off lasix.  - Continue Entresto 97/103 mg BID.  - Start coreg 3.125 mg BID. - Increase Bidil to 1 tab TID.  - Continue digoxin 0.125 mg daily. Dig level 0.7 on 02/17/18  - Continue spiro 25 mg daily. - Reinforced fluid restriction to < 2 L daily, sodium restriction to less than 2000 mg daily, and the importance of daily weights.  - Repeat Echo in 4 months.  If EF remains low, will need to consider ICD.  2. Chest discomfort - Cath w/o obstructive CAD.  - No s/s ischemia.  - D-Dimer negative recent admission.   3. HTN - Med changes as above. SBP 150s at home.   4. OSA, previous diagnosis - Non-compliant with CPAP.  - Completed sleep study last night.   5. CKD II - Stable creatinine on recent labs  6. Hypokalemia - K 4.5 last visit.   7. Obesity - Body mass index is 35.99 kg/m.  - Encouraged weight loss and exercise.  Start coreg 3.125 mg BID Increase bidil to 1 tab TID Follow up in 4 weeks with pharmacy, 8 weeks APP Follow up 4 months with Dr Gala Romney with echo  Alford Highland, NP 03/17/18   Greater than 50% of the 25 minute visit was spent in counseling/coordination of care regarding disease state education, salt/fluid restriction, sliding scale diuretics, and medication compliance.

## 2018-03-22 ENCOUNTER — Telehealth: Payer: Self-pay | Admitting: *Deleted

## 2018-03-22 NOTE — Telephone Encounter (Signed)
Informed patient of sleep study results and patient understanding was verbalized. Patient understands his sleep study showed they have significant sleep apnea and had successful PAP titration and will be set up with PAP unit.  Upon patient request DME selection is [facility] Patient understands he will be contacted by [facility]  to set up his cpap. Patient understands to call if [facility] does not contact him with new setup in a timely manner. Patient understands they will be called once confirmation has been received from [facility] that they have received their new machine to schedule 10 week follow up appointment.  [facility] notified of new cpap order  Please add to airview Patient was grateful for the call and thanked me.

## 2018-03-22 NOTE — Telephone Encounter (Signed)
-----   Message from Quintella Reichert, MD sent at 03/22/2018 12:19 PM EDT ----- Please let patient know that they have significant sleep apnea and had successful PAP titration and will be set up with PAP unit.  Please let DME know that order is in EPIC.  Please set patient up for OV in 10 weeks

## 2018-03-22 NOTE — Procedures (Signed)
Patient Name: Steven Chase, Steven Chase Date: 03/16/2018 Gender: Male D.O.B: 04-28-1986 Age (years): 31 Referring Provider: Barrington Ellison PA-C Height (inches): 79 Interpreting Physician: Fransico Him MD, ABSM Weight (lbs): 219 RPSGT: Laren Everts BMI: 35 MRN: [id number] Neck Size: 18.00  CLINICAL INFORMATION Sleep Study Type: Split Night CPAP  Indication for sleep study: Fatigue, Hypertension, Obesity, OSA, Re-Evaluation, Snoring, Witnessed Apneas  Epworth Sleepiness Score: 1  SLEEP STUDY TECHNIQUE As per the AASM Manual for the Scoring of Sleep and Associated Events v2.3 (April 2016) with a hypopnea requiring 4% desaturations.  The channels recorded and monitored were frontal, central and occipital EEG, electrooculogram (EOG), submentalis EMG (chin), nasal and oral airflow, thoracic and abdominal wall motion, anterior tibialis EMG, snore microphone, electrocardiogram, and pulse oximetry. Continuous positive airway pressure (CPAP) was initiated when the patient met split night criteria and was titrated according to treat sleep-disordered breathing.  MEDICATIONS Medications self-administered by patient taken the night of the study : BIDIL, ENTRESTO  RESPIRATORY PARAMETERS Diagnostic Total AHI (/hr): 81.4 RDI (/hr):83.6  OA Index (/hr): 47.1  CA Index (/hr): 2.2 REM AHI (/hr): 71.5  NREM AHI (/hr):83.5  Supine AHI (/hr):81.4  Min O2 Sat (%):63.00  Mean O2 (%):85.15  Time below 88% (min):85.8   Titration Optimal Pressure (cm):15 AHI at Optimal Pressure (/hr):1.2  Min O2 at Optimal Pressure (%):93.0 Supine % at Optimal (%):0  Sleep % at Optimal (%):    SLEEP ARCHITECTURE The recording time for the entire night was 443.6 minutes.  During a baseline period of 199.5 minutes, the patient slept for 134.9 minutes in REM and nonREM, yielding a sleep efficiency of 67.6%. Sleep onset after lights out was 4.1 minutes with a REM latency of 146.0 minutes. The patient spent  11.86% of the night in stage N1 sleep, 70.71% in stage N2 sleep, 0.00% in stage N3 and 17.42% in REM.  During the titration period of 233.8 minutes, the patient slept for 220.0 minutes in REM and nonREM, yielding a sleep efficiency of 94.1%. Sleep onset after CPAP initiation was 2.9 minutes with a REM latency of 58.0 minutes. The patient spent 5.22% of the night in stage N1 sleep, 74.55% in stage N2 sleep, 0.00% in stage N3 and 20.23% in REM.  CARDIAC DATA The 2 lead EKG demonstrated sinus rhythm. The mean heart rate was 93.45 beats per minute. Other EKG findings include: None.  LEG MOVEMENT DATA The total Periodic Limb Movements of Sleep (PLMS) were 0. The PLMS index was 0.00 .  IMPRESSIONS - Severe obstructive sleep apnea occurred during the diagnostic portion of the study (AHI = 81.4/hour). An optimal PAP pressure was selected for this patient ( 15 cm of water) - No significant central sleep apnea occurred during the diagnostic portion of the study (CAI = 2.2/hour). - Severe oxygen desaturation was noted during the diagnostic portion of the study (Min O2 = 63.00%). - The patient snored with loud snoring volume during the diagnostic portion of the study. - No cardiac abnormalities were noted during this study. - Clinically significant periodic limb movements did not occur during sleep.  DIAGNOSIS - Obstructive Sleep Apnea (327.23 [G47.33 ICD-10]) - Nocturnal Hypoxemia  RECOMMENDATIONS - Trial of CPAP therapy on 15 cm H2O with a Medium size Fisher&Paykel Full Face Mask Simplus mask and heated humidification. - Avoid alcohol, sedatives and other CNS depressants that may worsen sleep apnea and disrupt normal sleep architecture. - Sleep hygiene should be reviewed to assess factors that may improve sleep quality. - Weight management  and regular exercise should be initiated or continued. - Return to Sleep Center for re-evaluation after 10 weeks of therapy  [Electronically signed] 03/22/2018  12:16 PM  Fransico Him MD, ABSM Diplomate, American Board of Sleep Medicine

## 2018-03-23 ENCOUNTER — Telehealth (HOSPITAL_COMMUNITY): Payer: Self-pay

## 2018-03-23 NOTE — Telephone Encounter (Signed)
Okay for him to stop coreg with severe fatigue.

## 2018-03-23 NOTE — Telephone Encounter (Signed)
Patient calling CHF clinic triage to report he has been severely fatigued/drowsy since starting coreg 3.125 mg BID a week ago. Patient would like to stop medication and is asking for alternate medical therapy instead of coreg to try as the drowsiness is too severe for him to function at work and day to day. Will forward to Duwaine Maxin NP-C to advise.  Ave Filter, RN

## 2018-03-23 NOTE — Telephone Encounter (Addendum)
[facility]) called to inform me that patient has an outstanding balance with [facility] so they will not move forward with his cpap set up until his balance is satisfied.  Thereasa Distance will notify the patient.

## 2018-03-24 NOTE — Telephone Encounter (Signed)
Patient aware to remain off of coreg, also refilled entresto per patient request.  Ave Filter, RN

## 2018-03-25 ENCOUNTER — Other Ambulatory Visit (HOSPITAL_COMMUNITY): Payer: Self-pay | Admitting: *Deleted

## 2018-03-25 MED ORDER — SACUBITRIL-VALSARTAN 97-103 MG PO TABS: 1.0000 | ORAL_TABLET | Freq: Two times a day (BID) | ORAL | 3 refills | Status: DC

## 2018-04-11 ENCOUNTER — Inpatient Hospital Stay (HOSPITAL_COMMUNITY)
Admission: RE | Admit: 2018-04-11 | Discharge: 2018-04-11 | Disposition: A | Discharge status: Home / Self Care | Payer: BLUE CROSS/BLUE SHIELD | Source: Ambulatory Visit

## 2018-04-25 ENCOUNTER — Ambulatory Visit (HOSPITAL_COMMUNITY)
Admission: RE | Admit: 2018-04-25 | Discharge: 2018-04-25 | Disposition: A | Discharge status: Home / Self Care | Payer: BLUE CROSS/BLUE SHIELD | Source: Ambulatory Visit | Attending: Internal Medicine | Admitting: Internal Medicine

## 2018-04-25 VITALS — BP 160/100 | HR 60 | Wt 226.6 lb

## 2018-04-25 DIAGNOSIS — I5022 Chronic systolic (congestive) heart failure: Secondary | ICD-10-CM | POA: Diagnosis not present

## 2018-04-25 LAB — BASIC METABOLIC PANEL
Anion gap: 8 (ref 5–15)
BUN: 14 mg/dL (ref 6–20)
CHLORIDE: 103 mmol/L (ref 101–111)
CO2: 27 mmol/L (ref 22–32)
Calcium: 9.5 mg/dL (ref 8.9–10.3)
Creatinine, Ser: 1.21 mg/dL (ref 0.61–1.24)
GFR calc Af Amer: >60 mL/min (ref >60)
GLUCOSE: 105 mg/dL — AB (ref 65–99)
Potassium: 4.2 mmol/L (ref 3.5–5.1)
Sodium: 138 mmol/L (ref 135–145)

## 2018-04-25 NOTE — Patient Instructions (Addendum)
Take Carvedilol 3.125mg  twice daily Increase BiDil 2 tablets three times a day Weigh daily Follow Up appointment with Nurse Practitioner July 10 at 8:30 Call [facility] Pulmonary office for sleep study [phone]

## 2018-04-25 NOTE — Progress Notes (Signed)
PCP: Corwin Levins, MD PCP-Cardiologist: Kristeen Miss, MD  HF: Dr Gala Romney    HPI:   Steven Chase is a 32 y.o. male with chronic systolic, NICM by cath, HTN, OSA, and obesity.   Pt admitted 4/5 - 02/09/18 with non-radiating R sided chest pain, SOB and diaphoresis. Echo with newly reduced EF so taken for cath which showed marginal cardiac output and normal coronaries. cMRI showed small nodule questionable for sarcoidosis, but non-specific. Diuresed and meds adjusted as tolerated.  Suspected to be primarily HTN cardiomyopathy.   Pt presents today with his mother for initial Pharmacist medication titration clinic. Last visit, his Bidil was increased to 1 tab TID and he was started on carvedilol 3.125mg  BID.  He called complaining of extreme fatigue and was instructed to stop carvedilol - he states he decreased to Daily and has been tolerating it fine. He states compliance with medication and diet but his BP at home runs 170-200/100, 160/100 in clinic today.   Overall he is doing well. He occasionally is walking around the neighborhood with no problems but most of the day is sedentary. Denies SOB. Denies CP or dizziness. He has a good appetite,  Mom cooks all low salt meals.  Does not consistantly weigh daily.       . Shortness of breath/dyspnea on exertion? no  . Orthopnea/PND? no . Edema? no . Lightheadedness/dizziness? no . Daily weights at home? No but encouraged . Blood pressure/heart rate monitoring at home? Yes  . Following low-sodium/fluid-restricted diet? Yes - mother cooks all meals  HF Medications: Carvedilol 3.125mg  daily BiDil 1 tab TID Digoxin 0.125mg  daily Potassium daily Entresto 97-103mg  BID Spironolactone 25mg  Daily  Has the patient been experiencing any side effects to the medications prescribed?  no  Does the patient have any problems obtaining medications due to transportation or finances?   no  Understanding of regimen: poor Understanding of  indications: poor Potential of compliance: fair Patient understands to avoid NSAIDs. Patient understands to avoid decongestants.    Pertinent Lab Values: . Serum creatinine 1.21 (stable), CO2 27, Potassium 4.2 (stable), Sodium 138,  . Digoxin  0.7 4/19  Vital Signs: . Weight: 226lb  (dry weight: 225lb at home) . Blood pressure: 160/100 . Heart rate: 60 . O2 98%  Assessment: 1. Chronicsystolic CHF (EF 18%), due to NICM. NYHA class IIsymptoms. -volume status stable, no abdominal distension, no LE edema, weight stable - BP elevated 160/100 - questionable compliance with medicatitons, HR stable - increase carvedilol to correct dose 3.125mg  BID, Increase BiDil to target dose 2 tabs TID -Continue other current HF medicaitons Digoxin 0.125mg  daily Potassium daily Entresto 97-103mg  BID Spironolactone 25mg  Daily  - Basic disease state pathophysiology, medication indication, mechanism and side effects reviewed at length with patient and he verbalized understanding  . Chest discomfort - Cath w/o obstructive CAD. -No s/s ischemia.  - D-Dimer negative recent admission.   3. HTN -Med changes as above. SBP 150s at home.   4. OSA, previous diagnosis - Non-compliant with CPAP.  - Completed sleep study last night.   5. CKD II - Stable creatinine on recent labs  6. Hypokalemia - K 4.2 labs 6/24  7. Obesity - Body mass index is 35.99 kg/m.  - Encouraged weight loss and exercise    Plan: 1) Medication changes: Based on clinical presentation, vital signs and recent labs  - BP elevated 160/100 - questionable compliance with medicatitons, HR stable - increase carvedilol to correct dose 3.125mg  BID, Increase  BiDil to target dose 2 tabs TID,  Labs drawn today Cr and K stable  -Continue other current HF medicaitons Digoxin 0.125mg  daily Potassium daily Entresto 97-103mg  BID Spironolactone 25mg  Daily   2) Labs: Next visit as needed 3) Follow-up: NP  clinic May 11 2018   Ricarda Frame.D. CPP, BCPS Clinical Pharmacist [phone] 04/28/2018 10:43 PM

## 2018-04-28 MED ORDER — ISOSORB DINITRATE-HYDRALAZINE 20-37.5 MG PO TABS: 2.0000 | ORAL_TABLET | Freq: Three times a day (TID) | ORAL | 11 refills | Status: DC

## 2018-05-11 ENCOUNTER — Inpatient Hospital Stay (HOSPITAL_COMMUNITY): Admission: RE | Admit: 2018-05-11 | Payer: BLUE CROSS/BLUE SHIELD | Source: Ambulatory Visit

## 2018-06-01 ENCOUNTER — Ambulatory Visit: Payer: BLUE CROSS/BLUE SHIELD | Admitting: Internal Medicine

## 2018-06-01 ENCOUNTER — Encounter: Payer: Self-pay | Admitting: Internal Medicine

## 2018-06-01 VITALS — BP 124/86 | HR 104 | Temp 98.4°F | Ht 66.0 in | Wt 234.0 lb

## 2018-06-01 DIAGNOSIS — I5022 Chronic systolic (congestive) heart failure: Secondary | ICD-10-CM

## 2018-06-01 DIAGNOSIS — Z0000 Encounter for general adult medical examination without abnormal findings: Secondary | ICD-10-CM

## 2018-06-01 DIAGNOSIS — I10 Essential (primary) hypertension: Secondary | ICD-10-CM | POA: Diagnosis not present

## 2018-06-01 DIAGNOSIS — R739 Hyperglycemia, unspecified: Secondary | ICD-10-CM

## 2018-06-01 NOTE — Assessment & Plan Note (Signed)
stable overall by history and exam, and pt to continue medical treatment as before,  to f/u any worsening symptoms or concerns 

## 2018-06-01 NOTE — Assessment & Plan Note (Signed)
stable overall by history and exam, recent data reviewed with pt, and pt to continue medical treatment as before,  to f/u any worsening symptoms or concerns, for a1c with next labs 

## 2018-06-01 NOTE — Progress Notes (Signed)
Subjective:    Patient ID: Steven Chase, male    DOB: 1986-06-26, 32 y.o.   MRN: [id number]  HPI  Here to f/u; overall doing ok,  Pt denies chest pain, increasing sob or doe, wheezing, orthopnea, PND, increased LE swelling, palpitations, dizziness or syncope.  Pt denies new neurological symptoms such as new headache, or facial or extremity weakness or numbness.  Pt denies polydipsia, polyuria, or low sugar episode.  Pt states overall good compliance with meds, mostly trying to follow appropriate diet, with wt overall stable,  but little exercise however.  Has next CHF clinic appt in September.  No new complaints Past Medical History:  Diagnosis Date  . Hypertension   . Obesity   . OSA (obstructive sleep apnea) 11/05/2014   02/2015 -severe -AHI 106/h    Past Surgical History:  Procedure Laterality Date  . HERNIA REPAIR    . RIGHT/LEFT HEART CATH AND CORONARY ANGIOGRAPHY N/A 02/07/2018   Procedure: RIGHT/LEFT HEART CATH AND CORONARY ANGIOGRAPHY;  Surgeon: Marykay Lex, MD;  Location: [facility] INVASIVE CV LAB;  Service: Cardiovascular;  Laterality: N/A;    reports that he has never smoked. He has never used smokeless tobacco. He reports that he does not drink alcohol or use drugs. family history includes Heart disease in his maternal grandfather; Hypertension in his maternal grandfather, mother, and sister. No Known Allergies Current Outpatient Medications on File Prior to Visit  Medication Sig Dispense Refill  . carvedilol (COREG) 3.125 MG tablet Take 3.125 mg by mouth daily.    . digoxin (LANOXIN) 0.125 MG tablet Take 1 tablet (0.125 mg total) by mouth daily. 30 tablet 3  . isosorbide-hydrALAZINE (BIDIL) 20-37.5 MG tablet Take 2 tablets by mouth 3 (three) times daily. 90 tablet 11  . potassium chloride SA (K-DUR,KLOR-CON) 20 MEQ tablet Take 2 tablets (40 mEq total) by mouth daily. 60 tablet 11  . sacubitril-valsartan (ENTRESTO) 97-103 MG Take 1 tablet by mouth 2 (two) times daily. 60 tablet  3  . spironolactone (ALDACTONE) 25 MG tablet Take 1 tablet (25 mg total) by mouth daily. 30 tablet 3   No current facility-administered medications on file prior to visit.    Review of Systems  Constitutional: Negative for other unusual diaphoresis or sweats HENT: Negative for ear discharge or swelling Eyes: Negative for other worsening visual disturbances Respiratory: Negative for stridor or other swelling  Gastrointestinal: Negative for worsening distension or other blood Genitourinary: Negative for retention or other urinary change Musculoskeletal: Negative for other MSK pain or swelling Skin: Negative for color change or other new lesions Neurological: Negative for worsening tremors and other numbness  Psychiatric/Behavioral: Negative for worsening agitation or other fatigue All other system neg per pt    Objective:   Physical Exam BP 124/86   Pulse (!) 104   Temp 98.4 F (36.9 C) (Oral)   Ht 5\' 6"  (1.676 m)   Wt 234 lb (106.1 kg)   SpO2 95%   BMI 37.77 kg/m  VS noted,  Constitutional: Pt appears in NAD HENT: Head: NCAT.  Right Ear: External ear normal.  Left Ear: External ear normal.  Eyes: . Pupils are equal, round, and reactive to light. Conjunctivae and EOM are normal Nose: without d/c or deformity Neck: Neck supple. Gross normal ROM Cardiovascular: mild tachy rate and regular rhythm.   Pulmonary/Chest: Effort normal and breath sounds without rales or wheezing.  Abd:  Soft, NT, ND, + BS, no organomegaly Neurological: Pt is alert. At baseline orientation, motor  grossly intact Skin: Skin is warm. No rashes, other new lesions, no LE edema Psychiatric: Pt behavior is normal without agitation  No other exam findings    Assessment & Plan:

## 2018-06-01 NOTE — Assessment & Plan Note (Signed)
BP Readings from Last 3 Encounters:  06/01/18 124/86  04/25/18 (!) 160/100  03/17/18 (!) 158/90  stable overall by history and exam, recent data reviewed with pt, and pt to continue medical treatment as before,  to f/u any worsening symptoms or concerns

## 2018-06-01 NOTE — Patient Instructions (Addendum)
Please continue all other medications as before, and refills have been done if requested.  Please have the pharmacy call with any other refills you may need.  Please continue your efforts at being more active, low cholesterol diet, and weight control..  Please keep your appointments with your specialists as you may have planned  Please return in 6 months, or sooner if needed, with Lab testing done 3-5 days before  

## 2018-06-02 ENCOUNTER — Telehealth: Payer: Self-pay | Admitting: Cardiovascular Disease

## 2018-06-02 NOTE — Telephone Encounter (Signed)
Rodney ([facility]) called to inform me that patient has an outstanding balance with [facility] so they will not move forward with his cpap set up until his balance is satisfied.  Rodney will notify the patient. 

## 2018-06-02 NOTE — Telephone Encounter (Signed)
Patient states he had a cpap  machine before and was asked to return it for noncompliance and he never returned the product. [facility]) called to inform me that patient has an outstanding balance with [facility] so they will not move forward with his cpap set up until his balance is satisfied.  [facility] Distance will notify the patient.

## 2018-06-02 NOTE — Telephone Encounter (Signed)
New message  1) What problem are you experiencing? Patient states he did not get cpap machine  Who is your medical equipment company? n/a  Please route to the sleep study assistant.

## 2018-06-25 ENCOUNTER — Other Ambulatory Visit (HOSPITAL_COMMUNITY): Payer: Self-pay | Admitting: Student

## 2018-07-10 ENCOUNTER — Other Ambulatory Visit (HOSPITAL_COMMUNITY): Payer: Self-pay | Admitting: Student

## 2018-07-12 ENCOUNTER — Other Ambulatory Visit (HOSPITAL_COMMUNITY): Payer: Self-pay | Admitting: *Deleted

## 2018-07-12 MED ORDER — DIGOXIN 125 MCG PO TABS: 125.0000 ug | ORAL_TABLET | Freq: Every day | ORAL | 3 refills | Status: DC

## 2018-07-12 MED ORDER — SPIRONOLACTONE 25 MG PO TABS: 25.0000 mg | ORAL_TABLET | Freq: Every day | ORAL | 3 refills | Status: DC

## 2018-07-26 ENCOUNTER — Other Ambulatory Visit (HOSPITAL_COMMUNITY): Payer: Self-pay | Admitting: *Deleted

## 2018-07-26 DIAGNOSIS — I5022 Chronic systolic (congestive) heart failure: Secondary | ICD-10-CM

## 2018-07-27 ENCOUNTER — Encounter (HOSPITAL_COMMUNITY): Payer: Self-pay | Admitting: Internal Medicine

## 2018-07-27 ENCOUNTER — Ambulatory Visit (HOSPITAL_COMMUNITY)
Admission: RE | Admit: 2018-07-27 | Discharge: 2018-07-27 | Disposition: A | Discharge status: Home / Self Care | Payer: BLUE CROSS/BLUE SHIELD | Source: Ambulatory Visit | Attending: Internal Medicine | Admitting: Internal Medicine

## 2018-07-27 ENCOUNTER — Other Ambulatory Visit: Payer: Self-pay

## 2018-07-27 VITALS — BP 137/93 | HR 96 | Wt 241.4 lb

## 2018-07-27 DIAGNOSIS — Z8249 Family history of ischemic heart disease and other diseases of the circulatory system: Secondary | ICD-10-CM | POA: Diagnosis not present

## 2018-07-27 DIAGNOSIS — Z79899 Other long term (current) drug therapy: Secondary | ICD-10-CM | POA: Insufficient documentation

## 2018-07-27 DIAGNOSIS — I080 Rheumatic disorders of both mitral and aortic valves: Secondary | ICD-10-CM | POA: Insufficient documentation

## 2018-07-27 DIAGNOSIS — Z6838 Body mass index (BMI) 38.0-38.9, adult: Secondary | ICD-10-CM | POA: Diagnosis not present

## 2018-07-27 DIAGNOSIS — N182 Chronic kidney disease, stage 2 (mild): Secondary | ICD-10-CM | POA: Insufficient documentation

## 2018-07-27 DIAGNOSIS — I5022 Chronic systolic (congestive) heart failure: Secondary | ICD-10-CM | POA: Insufficient documentation

## 2018-07-27 DIAGNOSIS — E669 Obesity, unspecified: Secondary | ICD-10-CM | POA: Diagnosis not present

## 2018-07-27 DIAGNOSIS — G4733 Obstructive sleep apnea (adult) (pediatric): Secondary | ICD-10-CM | POA: Insufficient documentation

## 2018-07-27 DIAGNOSIS — I130 Hypertensive heart and chronic kidney disease with heart failure and stage 1 through stage 4 chronic kidney disease, or unspecified chronic kidney disease: Secondary | ICD-10-CM | POA: Diagnosis not present

## 2018-07-27 MED ORDER — FUROSEMIDE 20 MG PO TABS: 20.0000 mg | ORAL_TABLET | Freq: Every day | ORAL | 6 refills | Status: DC

## 2018-07-27 MED ORDER — CARVEDILOL 6.25 MG PO TABS: 6.2500 mg | ORAL_TABLET | Freq: Two times a day (BID) | ORAL | 3 refills | Status: DC

## 2018-07-27 NOTE — Progress Notes (Signed)
  Echocardiogram 2D Echocardiogram has been performed.  Steven Chase 07/27/2018, 9:05 AM

## 2018-07-27 NOTE — Progress Notes (Signed)
Advanced Heart Failure Clinic Note   PCP: Corwin Levins, MD PCP-Cardiologist: Kristeen Miss, MD  HF: Dr Gala Romney   HPI:  Steven Chase is a 32 y.o. male with chronic systolic, NICM by cath, HTN, OSA, and obesity.   Pt admitted 4/5 - 02/09/18 with non-radiating R sided chest pain, SOB and diaphoresis. Echo with newly EF 15% so taken for cath which showed marginal cardiac output and normal coronaries. cMRI showed EF 19% small nodule questionable for sarcoidosis, but non-specific. Diuresed and meds adjusted as tolerated.  Suspected to be primarily HTN cardiomyopathy.   Pt presents today for HF follow up. Since we last saw him has been to PharmD clinic and meds titrated. Feeling some better. Back to work at [employer]. Working [employer]. Can go up steps slowly. No CP, orthopnea, edema or PND. Had sleep study with severe OSA (AHI 81). Has gained 15 pounds in past few months. Eating a lot.   Echo today EF 25%   Echo 02/05/2018 LVEF 15%, Grade 2 DD, Trivial AI, Mild MR, Mild LAE, PA peak pressure 38 mm Hg, small pericardial effusion noted.  L/RHC 02/07/18 - Normal Coronaries RHC Procedural Findings: Hemodynamics (mmHg) RA mean 9 RV 39/8 PA 34/24 (29) PCWP 23 AO 95/68 Cardiac Output (Fick) 3.34 Cardiac Index (Fick) 1.61  Echo 02/05/2018 LVEF 15%, Grade 2 DD, Trivial AI, Mild MR, Mild LAE, PA peak pressure 38 mm Hg, small pericardial effusion noted  cMRI 02/08/18 1. Moderately dilated LV with moderate concentric LV hypertrophy. EF 19% with diffuse hypokinesis. 2. Normal RV size and systolic function. 3. On delayed enhancement, there was nodular transmural LGE in a small area of the mid inferior wall as described above. There was a subepicardial extension of the LGE nodule. This does not appear to be a coronary disease pattern as it was very discrete. Cardiac sarcoidosis is a concern with the discrete nodule, though LGE is more typically basal septal or inferolateral. LGE is not as  diffuse as one would expect with myocarditis. The pattern is not suggestive of amyloidosis.  Review of systems complete and found to be negative unless listed in HPI.   Past Medical History:  Diagnosis Date  . Hypertension   . Obesity   . OSA (obstructive sleep apnea) 11/05/2014   02/2015 -severe -AHI 106/h     Current Outpatient Medications  Medication Sig Dispense Refill  . carvedilol (COREG) 3.125 MG tablet Take 3.125 mg by mouth 2 (two) times daily with a meal.     . digoxin (LANOXIN) 0.125 MG tablet Take 1 tablet (125 mcg total) by mouth daily. 30 tablet 3  . isosorbide-hydrALAZINE (BIDIL) 20-37.5 MG tablet Take 2 tablets by mouth 3 (three) times daily. 90 tablet 11  . potassium chloride SA (K-DUR,KLOR-CON) 20 MEQ tablet Take 2 tablets (40 mEq total) by mouth daily. 60 tablet 11  . sacubitril-valsartan (ENTRESTO) 97-103 MG Take 1 tablet by mouth 2 (two) times daily. 60 tablet 3  . spironolactone (ALDACTONE) 25 MG tablet Take 1 tablet (25 mg total) by mouth daily. 30 tablet 3   No current facility-administered medications for this encounter.     No Known Allergies    Social History   Socioeconomic History  . Marital status: Single    Spouse name: Not on file  . Number of children: 1  . Years of education: 37  . Highest education level: Not on file  Occupational History  . Occupation: Therapist, music: [employer]  Social Needs  . Financial resource strain: Not on file  . Food insecurity:    Worry: Not on file    Inability: Not on file  . Transportation needs:    Medical: Not on file    Non-medical: Not on file  Tobacco Use  . Smoking status: Never Smoker  . Smokeless tobacco: Never Used  Substance and Sexual Activity  . Alcohol use: No  . Drug use: No  . Sexual activity: Not on file  Lifestyle  . Physical activity:    Days per week: Not on file    Minutes per session: Not on file  . Stress: Not on file  Relationships  . Social connections:     Talks on phone: Not on file    Gets together: Not on file    Attends religious service: Not on file    Active member of club or organization: Not on file    Attends meetings of clubs or organizations: Not on file    Relationship status: Not on file  . Intimate partner violence:    Fear of current or ex partner: Not on file    Emotionally abused: Not on file    Physically abused: Not on file    Forced sexual activity: Not on file  Other Topics Concern  . Not on file  Social History Narrative   Born and raised in [place], [place]. Currently live in a private residence with his mother. Fun: Play with his daughter.   Denies religious beliefs that would effect health care.    Family History  Problem Relation Age of Onset  . Hypertension Mother   . Heart disease Maternal Grandfather   . Hypertension Maternal Grandfather   . Hypertension Sister     Vitals:   07/27/18 0858  BP: (!) 137/93  Pulse: 96  SpO2: 97%  Weight: 109.5 kg (241 lb 6 oz)    Wt Readings from Last 3 Encounters:  07/27/18 109.5 kg (241 lb 6 oz)  06/01/18 106.1 kg (234 lb)  04/25/18 102.8 kg (226 lb 9.6 oz)    PHYSICAL EXAM: General:  Obese male. No resp difficulty HEENT: normal Neck: supple. Thick neck  no obvious JVD. Carotids 2+ bilat; no bruits. No lymphadenopathy or thryomegaly appreciated. Cor: PMI nondisplaced. Regular rate & rhythm. No rubs, gallops or murmurs. Lungs: clear decreased at bases Abdomen: obese soft, nontender, nondistended. No hepatosplenomegaly. No bruits or masses. Good bowel sounds. Extremities: no cyanosis, clubbing, rash, trace edema Neuro: alert & orientedx3, cranial nerves grossly intact. moves all 4 extremities w/o difficulty. Affect pleasant  ASSESSMENT & PLAN:  1. Chronic systolic CHF - NICM by cath 02/07/18, with low cardiac output. Suspected primarily HTN cardiomyopathy -Echo4/04/2018 LVEF 15%, Grade 2 DD, Trivial AI, Mild MR, Mild LAE, PA peak pressure 38 mm Hg, small  pericardial effusion noted. - cMRI 02/08/18 LVEF 19%, Normal RV, Discrete nodule in mid inferior wall concerning for possible Sarcoid involvement. No amyloid.  - Hgb A1C 5.5, TSH normal, AST mildly elevated, HIV negative. Hepatitis panel negative.  - Stable NYHA II currently.  - Volume status likely up a bit but hard to tell. Resume lasix 20mg  daily - Continue Entresto 97/103 mg BID.  - Increase coreg to 6.25 mg BID. - Continuee Bidil to 1 tab TID.  - Continue digoxin 0.125 mg daily. Dig level 0.7 on 02/17/18  - Continue spiro 25 mg daily. - Reinforced fluid restriction to < 2 L daily, sodium restriction to less than 2000 mg  daily, and the importance of daily weights.  - Echo today EF 25%. We discussed continued watchful waiting versus ICD. He wants to proceed with ICD. Refer to EP.   2. HTN - BP improving but still not well controlled. Increase carvedilol and add back lasix - Stressed need for weight loss and OSA treatment  4. OSA, previous diagnosis - Sleep study 03/30/18 with severe OSA. AHI 81.4 with sats down to 63% - Following with Dr. Mayford Knife. About to start CPAP  5. CKD II - Stable creatinine on recent labs. Repeat labs in 1 week after restarting lasix  6. Obesity - Body mass index is 38.96 kg/m.  - Encouraged weight loss and exercise.   Arvilla Meres, MD 07/27/18

## 2018-07-27 NOTE — Patient Instructions (Signed)
Increase Carvedilol to 6.25 mg Twice daily   Start Furosemide (Lasix) 20 mg daily  Labs in 1 week  You have been referred to EP (electrophysiologist) to discuss getting a defibrillator, they will call you to schedule  Your physician recommends that you schedule a follow-up appointment in: 2-3 months with APP    Cardioverter Defibrillator Implantation An implantable cardioverter defibrillator (ICD) is a small device that is placed under the skin in the chest or abdomen. An ICD consists of a battery, a small computer (pulse generator), and wires (leads) that go into the heart. An ICD is used to detect and correct two types of dangerous irregular heartbeats (arrhythmias):  A rapid heart rhythm (tachycardia).  An arrhythmia in which the lower chambers of the heart (ventricles) contract in an uncoordinated way (fibrillation).  When an ICD detects tachycardia, it sends a low-energy shock to the heart to restore the heartbeat to normal (cardioversion). This signal is usually painless. If cardioversion does not work or if the ICD detects fibrillation, it delivers a high-energy shock to the heart (defibrillation) to restart the heart. This shock may feel like a strong jolt in the chest. Your health care provider may prescribe an ICD if:  You have had an arrhythmia that originated in the ventricles.  Your heart has been damaged by a disease or heart condition.  Sometimes, ICDs are programmed to act as a device called a pacemaker. Pacemakers can be used to treat a slow heartbeat (bradycardia) or tachycardia by taking over the heart rate with electrical impulses. Tell a health care provider about:  Any allergies you have.  All medicines you are taking, including vitamins, herbs, eye drops, creams, and over-the-counter medicines.  Any problems you or family members have had with anesthetic medicines.  Any blood disorders you have.  Any surgeries you have had.  Any medical conditions you  have.  Whether you are pregnant or may be pregnant. What are the risks? Generally, this is a safe procedure. However, problems may occur, including:  Swelling, bleeding, or bruising.  Infection.  Blood clots.  Damage to other structures or organs, such as nerves, blood vessels, or the heart.  Allergic reactions to medicines used during the procedure.  What happens before the procedure? Staying hydrated Follow instructions from your health care provider about hydration, which may include:  Up to 2 hours before the procedure - you may continue to drink clear liquids, such as water, clear fruit juice, black coffee, and plain tea.  Eating and drinking restrictions Follow instructions from your health care provider about eating and drinking, which may include:  8 hours before the procedure - stop eating heavy meals or foods such as meat, fried foods, or fatty foods.  6 hours before the procedure - stop eating light meals or foods, such as toast or cereal.  6 hours before the procedure - stop drinking milk or drinks that contain milk.  2 hours before the procedure - stop drinking clear liquids.  Medicine Ask your health care provider about:  Changing or stopping your normal medicines. This is important if you take diabetes medicines or blood thinners.  Taking medicines such as aspirin and ibuprofen. These medicines can thin your blood. Do not take these medicines before your procedure if your doctor tells you not to.  Tests  You may have blood tests.  You may have a test to check the electrical signals in your heart (electrocardiogram, ECG).  You may have imaging tests, such as a chest  X-ray. General instructions  For 24 hours before the procedure, stop using products that contain nicotine or tobacco, such as cigarettes and e-cigarettes. If you need help quitting, ask your health care provider.  Plan to have someone take you home from the hospital or clinic.  You may be  asked to shower with a germ-killing soap. What happens during the procedure?  To reduce your risk of infection: ? Your health care team will wash or sanitize their hands. ? Your skin will be washed with soap. ? Hair may be removed from the surgical area.  Small monitors will be put on your body. They will be used to check your heart, blood pressure, and oxygen level.  An IV tube will be inserted into one of your veins.  You will be given one or more of the following: ? A medicine to help you relax (sedative). ? A medicine to numb the area (local anesthetic). ? A medicine to make you fall asleep (general anesthetic).  Leads will be guided through a blood vessel into your heart and attached to your heart muscles. Depending on the ICD, the leads may go into one ventricle or they may go into both ventricles and into an upper chamber of the heart. An X-ray machine (fluoroscope) will be usedto help guide the leads.  A small incision will be made to create a deep pocket under your skin.  The pulse generator will be placed into the pocket.  The ICD will be tested.  The incision will be closed with stitches (sutures), skin glue, or staples.  A bandage (dressing) will be placed over the incision. This procedure may vary among health care providers and hospitals. What happens after the procedure?  Your blood pressure, heart rate, breathing rate, and blood oxygen level will be monitored often until the medicines you were given have worn off.  A chest X-ray will be taken to check that the ICD is in the right place.  You will need to stay in the hospital for 1-2 days so your health care provider can make sure your ICD is working.  Do not drive for 24 hours if you received a sedative. Ask your health care provider when it is safe for you to drive.  You may be given an identification card explaining that you have an ICD. Summary  An implantable cardioverter defibrillator (ICD) is a small  device that is placed under the skin in the chest or abdomen. It is used to detect and correct dangerous irregular heartbeats (arrhythmias).  An ICD consists of a battery, a small computer (pulse generator), and wires (leads) that go into the heart.  When an ICD detects rapid heart rhythm (tachycardia), it sends a low-energy shock to the heart to restore the heartbeat to normal (cardioversion). If cardioversion does not work or if the ICD detects uncoordinated heart contractions (fibrillation), it delivers a high-energy shock to the heart (defibrillation) to restart the heart.  You will need to stay in the hospital for 1-2 days to make sure your ICD is working. This information is not intended to replace advice given to you by your health care provider. Make sure you discuss any questions you have with your health care provider. Document Released: 07/11/2002 Document Revised: 10/28/2016 Document Reviewed: 10/28/2016 Elsevier Interactive Patient Education  2017 [employer].

## 2018-08-03 ENCOUNTER — Ambulatory Visit (HOSPITAL_COMMUNITY)
Admission: RE | Admit: 2018-08-03 | Discharge: 2018-08-03 | Disposition: A | Discharge status: Home / Self Care | Payer: BLUE CROSS/BLUE SHIELD | Source: Ambulatory Visit | Attending: Internal Medicine | Admitting: Internal Medicine

## 2018-08-03 DIAGNOSIS — I5022 Chronic systolic (congestive) heart failure: Secondary | ICD-10-CM | POA: Diagnosis not present

## 2018-08-03 LAB — BASIC METABOLIC PANEL
Anion gap: 10 (ref 5–15)
BUN: 18 mg/dL (ref 6–20)
CHLORIDE: 100 mmol/L (ref 98–111)
CO2: 27 mmol/L (ref 22–32)
CREATININE: 1.35 mg/dL — AB (ref 0.61–1.24)
Calcium: 9.3 mg/dL (ref 8.9–10.3)
GFR calc Af Amer: >60 mL/min (ref >60)
GFR calc non Af Amer: >60 mL/min (ref >60)
Glucose, Bld: 126 mg/dL — ABNORMAL HIGH (ref 70–99)
POTASSIUM: 3.6 mmol/L (ref 3.5–5.1)
Sodium: 137 mmol/L (ref 135–145)

## 2018-08-03 LAB — BRAIN NATRIURETIC PEPTIDE: B Natriuretic Peptide: 74.6 pg/mL (ref 0.0–100.0)

## 2018-08-10 ENCOUNTER — Institutional Professional Consult (permissible substitution): Payer: BLUE CROSS/BLUE SHIELD | Admitting: Internal Medicine

## 2018-08-10 DIAGNOSIS — G4733 Obstructive sleep apnea (adult) (pediatric): Secondary | ICD-10-CM | POA: Diagnosis not present

## 2018-08-24 ENCOUNTER — Institutional Professional Consult (permissible substitution): Payer: BLUE CROSS/BLUE SHIELD | Admitting: Internal Medicine

## 2018-08-25 ENCOUNTER — Encounter: Payer: Self-pay | Admitting: Internal Medicine

## 2018-09-02 ENCOUNTER — Other Ambulatory Visit (HOSPITAL_COMMUNITY): Payer: Self-pay | Admitting: Cardiology

## 2018-09-10 DIAGNOSIS — G4733 Obstructive sleep apnea (adult) (pediatric): Secondary | ICD-10-CM | POA: Diagnosis not present

## 2018-09-20 NOTE — Progress Notes (Signed)
Advanced Heart Failure Clinic Note   PCP: Corwin Levins, MD PCP-Cardiologist: Kristeen Miss, MD  HF: Dr Gala Romney   HPI:  Steven Chase is a 32 y.o. male with chronic systolic, NICM by cath, HTN, OSA, and obesity.   Pt admitted 4/5 - 02/09/18 with non-radiating R sided chest pain, SOB and diaphoresis. Echo with newly EF 15% so taken for cath which showed marginal cardiac output and normal coronaries. cMRI showed EF 19% small nodule questionable for sarcoidosis, but non-specific. Diuresed and meds adjusted as tolerated.  Suspected to be primarily HTN cardiomyopathy.   He returns today for HF follow up. Last visit, coreg was increased and lasix was restarted. Repeat labs stable. He was also referred to EP for ICD consideration. He canceled EP appointment because he is not interested in ICD. Overall doing fine. Denies SOB, orthopnea, or edema. No problems with hills or stairs. No CP or dizziness. Appetite and energy level fine. No cough. Wearing CPAP qHS. Weights stable at home 239-240 lbs, but up 9 lbs on our scale. He is interested in speaking to a surgeon about liposurgery/liposuction. Taking all medications. Tries to limit fluid and salt intake. Eats salad when he eats out..   Echo 07/27/18: EF 25%   Echo 02/05/2018 LVEF 15%, Grade 2 DD, Trivial AI, Mild MR, Mild LAE, PA peak pressure 38 mm Hg, small pericardial effusion noted.  L/RHC 02/07/18 - Normal Coronaries RHC Procedural Findings: Hemodynamics (mmHg) RA mean 9 RV 39/8 PA 34/24 (29) PCWP 23 AO 95/68 Cardiac Output (Fick) 3.34 Cardiac Index (Fick) 1.61  Echo 02/05/2018 LVEF 15%, Grade 2 DD, Trivial AI, Mild MR, Mild LAE, PA peak pressure 38 mm Hg, small pericardial effusion noted  cMRI 02/08/18 1. Moderately dilated LV with moderate concentric LV hypertrophy. EF 19% with diffuse hypokinesis. 2. Normal RV size and systolic function. 3. On delayed enhancement, there was nodular transmural LGE in a small area of the mid inferior  wall as described above. There was a subepicardial extension of the LGE nodule. This does not appear to be a coronary disease pattern as it was very discrete. Cardiac sarcoidosis is a concern with the discrete nodule, though LGE is more typically basal septal or inferolateral. LGE is not as diffuse as one would expect with myocarditis. The pattern is not suggestive of amyloidosis.  Review of systems complete and found to be negative unless listed in HPI.   Past Medical History:  Diagnosis Date  . Hypertension   . Obesity   . OSA (obstructive sleep apnea) 11/05/2014   02/2015 -severe -AHI 106/h     Current Outpatient Medications  Medication Sig Dispense Refill  . carvedilol (COREG) 6.25 MG tablet Take 1 tablet (6.25 mg total) by mouth 2 (two) times daily with a meal. 60 tablet 3  . digoxin (LANOXIN) 0.125 MG tablet Take 1 tablet (125 mcg total) by mouth daily. 30 tablet 3  . ENTRESTO 97-103 MG TAKE 1 TABLET BY MOUTH TWICE DAILY 60 tablet 3  . furosemide (LASIX) 20 MG tablet Take 1 tablet (20 mg total) by mouth daily. 30 tablet 6  . isosorbide-hydrALAZINE (BIDIL) 20-37.5 MG tablet Take 2 tablets by mouth 3 (three) times daily. 90 tablet 11  . potassium chloride SA (K-DUR,KLOR-CON) 20 MEQ tablet Take 2 tablets (40 mEq total) by mouth daily. 60 tablet 11  . spironolactone (ALDACTONE) 25 MG tablet Take 1 tablet (25 mg total) by mouth daily. 30 tablet 3   No current facility-administered medications for this encounter.  No Known Allergies    Social History   Socioeconomic History  . Marital status: Single    Spouse name: Not on file  . Number of children: 1  . Years of education: 36  . Highest education level: Not on file  Occupational History  . Occupation: Therapist, music: [employer]  Social Needs  . Financial resource strain: Not on file  . Food insecurity:    Worry: Not on file    Inability: Not on file  . Transportation needs:    Medical: Not on file     Non-medical: Not on file  Tobacco Use  . Smoking status: Never Smoker  . Smokeless tobacco: Never Used  Substance and Sexual Activity  . Alcohol use: No  . Drug use: No  . Sexual activity: Not on file  Lifestyle  . Physical activity:    Days per week: Not on file    Minutes per session: Not on file  . Stress: Not on file  Relationships  . Social connections:    Talks on phone: Not on file    Gets together: Not on file    Attends religious service: Not on file    Active member of club or organization: Not on file    Attends meetings of clubs or organizations: Not on file    Relationship status: Not on file  . Intimate partner violence:    Fear of current or ex partner: Not on file    Emotionally abused: Not on file    Physically abused: Not on file    Forced sexual activity: Not on file  Other Topics Concern  . Not on file  Social History Narrative   Born and raised in [place], [place]. Currently live in a private residence with his mother. Fun: Play with his daughter.   Denies religious beliefs that would effect health care.    Family History  Problem Relation Age of Onset  . Hypertension Mother   . Heart disease Maternal Grandfather   . Hypertension Maternal Grandfather   . Hypertension Sister     Vitals:   09/26/18 0932  BP: (!) 148/86  Pulse: 97  SpO2: 93%  Weight: 113.6 kg (250 lb 6.4 oz)    Wt Readings from Last 3 Encounters:  09/26/18 113.6 kg (250 lb 6.4 oz)  07/27/18 109.5 kg (241 lb 6 oz)  06/01/18 106.1 kg (234 lb)    PHYSICAL EXAM: General: Obese. No resp difficulty. HEENT: Normal Neck: Supple. JVP difficult but does not appear elevated. Carotids 2+ bilat; no bruits. No thyromegaly or nodule noted. Cor: PMI nondisplaced. RRR, No M/G/R noted Lungs: decreased in bases.  Abdomen: obese, soft, non-tender, non-distended, no HSM. No bruits or masses. +BS  Extremities: No cyanosis, clubbing, or rash. R and LLE no edema.  Neuro: Alert & orientedx3,  cranial nerves grossly intact. moves all 4 extremities w/o difficulty. Affect pleasant   ASSESSMENT & PLAN:  1. Chronic systolic CHF - NICM by cath 02/07/18, with low cardiac output. Suspected primarily HTN cardiomyopathy -Echo4/04/2018 LVEF 15%, Grade 2 DD, Trivial AI, Mild MR, Mild LAE, PA peak pressure 38 mm Hg, small pericardial effusion noted. - cMRI 02/08/18 LVEF 19%, Normal RV, Discrete nodule in mid inferior wall concerning for possible Sarcoid involvement. No amyloid.  - Hgb A1C 5.5, TSH normal, AST mildly elevated, HIV negative. Hepatitis panel negative.  - Echo 07/2018  EF 25%. Referred to EP for ICD, but canceled appointment. He is not interested  in ICD. - Stable NYHA II currently.  - Volume status stable. Continue lasix 20mg  daily.  - Continue Entresto 97/103 mg BID.  - Increase coreg to 12.5 mg BID. - Continue Bidil 2 tab TID.  - Continue digoxin 0.125 mg daily. Dig level okay 01/2018. - Continue spiro 25 mg daily. - Reinforced fluid restriction to < 2 L daily, sodium restriction to less than 2000 mg daily, and the importance of daily weights.   2. HTN - Elevated today. Increase coreg as above.  - Stressed need for weight loss and OSA treatment  4. OSA, previous diagnosis - Sleep study 03/30/18 with severe OSA. AHI 81.4 with sats down to 63% - Following with Dr. Mayford Knife. Now using CPAP qHS.   5. CKD II - Creatinine stable 1.35 after restarting lasix.   6. Obesity - Body mass index is 40.42 kg/m.  - Encouraged weight loss and exercise. - He is interested in possible liposuction/surgery. Encouraged him to follow up with PCP about this. With EF low, would not be a great candidate for anesthesia.   Increase coreg to 12.5 mg BID Follow up 2 months Encouraged him to follow up with PCP about liposurgery.   Alford Highland, NP 09/26/18   Greater than 50% of the 25 minute visit was spent in counseling/coordination of care regarding disease state education, salt/fluid  restriction, sliding scale diuretics, and medication compliance.

## 2018-09-26 ENCOUNTER — Encounter (HOSPITAL_COMMUNITY): Payer: Self-pay

## 2018-09-26 ENCOUNTER — Ambulatory Visit (HOSPITAL_COMMUNITY)
Admission: RE | Admit: 2018-09-26 | Discharge: 2018-09-26 | Disposition: A | Discharge status: Home / Self Care | Payer: BLUE CROSS/BLUE SHIELD | Source: Ambulatory Visit | Attending: Cardiology | Admitting: Cardiology

## 2018-09-26 VITALS — BP 148/86 | HR 97 | Wt 250.4 lb

## 2018-09-26 DIAGNOSIS — I428 Other cardiomyopathies: Secondary | ICD-10-CM | POA: Insufficient documentation

## 2018-09-26 DIAGNOSIS — Z79899 Other long term (current) drug therapy: Secondary | ICD-10-CM | POA: Insufficient documentation

## 2018-09-26 DIAGNOSIS — N182 Chronic kidney disease, stage 2 (mild): Secondary | ICD-10-CM | POA: Diagnosis not present

## 2018-09-26 DIAGNOSIS — Z6841 Body Mass Index (BMI) 40.0 and over, adult: Secondary | ICD-10-CM | POA: Diagnosis not present

## 2018-09-26 DIAGNOSIS — I130 Hypertensive heart and chronic kidney disease with heart failure and stage 1 through stage 4 chronic kidney disease, or unspecified chronic kidney disease: Secondary | ICD-10-CM | POA: Diagnosis not present

## 2018-09-26 DIAGNOSIS — E669 Obesity, unspecified: Secondary | ICD-10-CM | POA: Diagnosis not present

## 2018-09-26 DIAGNOSIS — I10 Essential (primary) hypertension: Secondary | ICD-10-CM | POA: Diagnosis not present

## 2018-09-26 DIAGNOSIS — I5022 Chronic systolic (congestive) heart failure: Secondary | ICD-10-CM

## 2018-09-26 DIAGNOSIS — G4733 Obstructive sleep apnea (adult) (pediatric): Secondary | ICD-10-CM | POA: Diagnosis not present

## 2018-09-26 DIAGNOSIS — Z8249 Family history of ischemic heart disease and other diseases of the circulatory system: Secondary | ICD-10-CM | POA: Insufficient documentation

## 2018-09-26 MED ORDER — SACUBITRIL-VALSARTAN 97-103 MG PO TABS: 1.0000 | ORAL_TABLET | Freq: Two times a day (BID) | ORAL | 3 refills | Status: DC

## 2018-09-26 MED ORDER — CARVEDILOL 12.5 MG PO TABS: 12.5000 mg | ORAL_TABLET | Freq: Two times a day (BID) | ORAL | 3 refills | Status: DC

## 2018-09-26 NOTE — Patient Instructions (Signed)
INCREASE Coreg to 12.5 mg, one tab twice a day  Your primary physician is Dr Oliver Barre [phone]  Your physician recommends that you schedule a follow-up appointment in: 2 months  in the [facility]    Do the following things EVERYDAY: 1) Weigh yourself in the morning before breakfast. Write it down and keep it in a log. 2) Take your medicines as prescribed 3) Eat low salt foods-Limit salt (sodium) to 2000 mg per day.  4) Stay as active as you can everyday 5) Limit all fluids for the day to less than 2 liters

## 2018-10-06 ENCOUNTER — Encounter: Payer: Self-pay | Admitting: Internal Medicine

## 2018-10-06 ENCOUNTER — Ambulatory Visit: Payer: BLUE CROSS/BLUE SHIELD | Admitting: Internal Medicine

## 2018-10-06 VITALS — BP 122/84 | HR 101 | Temp 98.0°F | Ht 66.0 in | Wt 244.0 lb

## 2018-10-06 DIAGNOSIS — R739 Hyperglycemia, unspecified: Secondary | ICD-10-CM

## 2018-10-06 DIAGNOSIS — Z0000 Encounter for general adult medical examination without abnormal findings: Secondary | ICD-10-CM | POA: Diagnosis not present

## 2018-10-06 DIAGNOSIS — I10 Essential (primary) hypertension: Secondary | ICD-10-CM

## 2018-10-06 DIAGNOSIS — E669 Obesity, unspecified: Secondary | ICD-10-CM

## 2018-10-06 DIAGNOSIS — Z23 Encounter for immunization: Secondary | ICD-10-CM | POA: Diagnosis not present

## 2018-10-06 LAB — POCT GLYCOSYLATED HEMOGLOBIN (HGB A1C)
HbA1c POC (<> result, manual entry): 0 % (ref 4.0–5.6)
HbA1c, POC (controlled diabetic range): 0 % (ref 0.0–7.0)
HbA1c, POC (prediabetic range): 0 % — AB (ref 5.7–6.4)
Hemoglobin A1C: 5.6 % (ref 4.0–5.6)

## 2018-10-06 NOTE — Assessment & Plan Note (Signed)

## 2018-10-06 NOTE — Assessment & Plan Note (Signed)
stable overall by history and exam, recent data reviewed with pt, and pt to continue medical treatment as before,  to f/u any worsening symptoms or concerns  

## 2018-10-06 NOTE — Assessment & Plan Note (Signed)
Has hx NICM well controlled, pt asks for referral to Plastic Surgury

## 2018-10-06 NOTE — Progress Notes (Addendum)
Subjective:    Patient ID: Steven Chase, male    DOB: [birth year], 32 y.o.   MRN: [id number]  HPI  Here for wellness and f/u;  Overall doing ok;  Pt denies Chest pain, worsening SOB, DOE, wheezing, orthopnea, PND, worsening LE edema, palpitations, dizziness or syncope.  Pt denies neurological change such as new headache, facial or extremity weakness.  Pt denies polydipsia, polyuria, or low sugar symptoms. Pt states overall good compliance with treatment and medications, good tolerability, and has been trying to follow appropriate diet.  Pt denies worsening depressive symptoms, suicidal ideation or panic. No fever, night sweats, wt loss, loss of appetite, or other constitutional symptoms.  Pt states good ability with ADL's, has low fall risk, home safety reviewed and adequate, no other significant changes in hearing or vision, and only occasionally active with exercise.  Asks for surgical referral for wt loss, not interested in gastric bypass, and difficult to effect wt loss with diet and activity given his comorbids.  No other complaints Past Medical History:  Diagnosis Date  . Hypertension   . Obesity   . OSA (obstructive sleep apnea) 11/05/2014   02/2015 -severe -AHI 106/h    Past Surgical History:  Procedure Laterality Date  . HERNIA REPAIR    . RIGHT/LEFT HEART CATH AND CORONARY ANGIOGRAPHY N/A 02/07/2018   Procedure: RIGHT/LEFT HEART CATH AND CORONARY ANGIOGRAPHY;  Surgeon: Marykay Lex, MD;  Location: [facility] INVASIVE CV LAB;  Service: Cardiovascular;  Laterality: N/A;    reports that he has never smoked. He has never used smokeless tobacco. He reports that he does not drink alcohol or use drugs. family history includes Heart disease in his maternal grandfather; Hypertension in his maternal grandfather, mother, and sister. No Known Allergies Current Outpatient Medications on File Prior to Visit  Medication Sig Dispense Refill  . carvedilol (COREG) 12.5 MG tablet Take 1 tablet (12.5 mg  total) by mouth 2 (two) times daily with a meal. 60 tablet 3  . digoxin (LANOXIN) 0.125 MG tablet Take 1 tablet (125 mcg total) by mouth daily. 30 tablet 3  . furosemide (LASIX) 20 MG tablet Take 1 tablet (20 mg total) by mouth daily. 30 tablet 6  . isosorbide-hydrALAZINE (BIDIL) 20-37.5 MG tablet Take 2 tablets by mouth 3 (three) times daily. 90 tablet 11  . potassium chloride SA (K-DUR,KLOR-CON) 20 MEQ tablet Take 2 tablets (40 mEq total) by mouth daily. 60 tablet 11  . sacubitril-valsartan (ENTRESTO) 97-103 MG Take 1 tablet by mouth 2 (two) times daily. 60 tablet 3  . spironolactone (ALDACTONE) 25 MG tablet Take 1 tablet (25 mg total) by mouth daily. 30 tablet 3   No current facility-administered medications on file prior to visit.    Review of Systems Constitutional: Negative for other unusual diaphoresis, sweats, appetite or weight changes HENT: Negative for other worsening hearing loss, ear pain, facial swelling, mouth sores or neck stiffness.   Eyes: Negative for other worsening pain, redness or other visual disturbance.  Respiratory: Negative for other stridor or swelling Cardiovascular: Negative for other palpitations or other chest pain  Gastrointestinal: Negative for worsening diarrhea or loose stools, blood in stool, distention or other pain Genitourinary: Negative for hematuria, flank pain or other change in urine volume.  Musculoskeletal: Negative for myalgias or other joint swelling.  Skin: Negative for other color change, or other wound or worsening drainage.  Neurological: Negative for other syncope or numbness. Hematological: Negative for other adenopathy or swelling Psychiatric/Behavioral: Negative for hallucinations, other  worsening agitation, SI, self-injury, or new decreased concentration \\All  other system neg per pt    Objective:   Physical Exam BP 122/84   Pulse (!) 101   Temp 98 F (36.7 C) (Oral)   Ht 5\' 6"  (1.676 m)   Wt 244 lb (110.7 kg)   SpO2 91%   BMI  39.38 kg/m  VS noted,  Constitutional: Pt is oriented to person, place, and time. Appears well-developed and well-nourished, in no significant distress and comfortable Head: Normocephalic and atraumatic  Eyes: Conjunctivae and EOM are normal. Pupils are equal, round, and reactive to light Right Ear: External ear normal without discharge Left Ear: External ear normal without discharge Nose: Nose without discharge or deformity Mouth/Throat: Oropharynx is without other ulcerations and moist  Neck: Normal range of motion. Neck supple. No JVD present. No tracheal deviation present or significant neck LA or mass Cardiovascular: Normal rate, regular rhythm, normal heart sounds and intact distal pulses.   Pulmonary/Chest: WOB normal and breath sounds without rales or wheezing  Abdominal: Soft. Bowel sounds are normal. NT. No HSM  Musculoskeletal: Normal range of motion. Exhibits no edema Lymphadenopathy: Has no other cervical adenopathy.  Neurological: Pt is alert and oriented to person, place, and time. Pt has normal reflexes. No cranial nerve deficit. Motor grossly intact, Gait intact Skin: Skin is warm and dry. No rash noted or new ulcerations Psychiatric:  Has normal mood and affect. Behavior is normal without agitation No other exam findings  Lab Results  Component Value Date   WBC 11.4 (H) 02/08/2018   HGB 17.8 (H) 02/08/2018   HCT 53.6 (H) 02/08/2018   PLT 256 02/08/2018   GLUCOSE 126 (H) 08/03/2018   CHOL 230 (H) 02/06/2018   TRIG 209 (H) 02/06/2018   HDL 37 (L) 02/06/2018   LDLCALC 151 (H) 02/06/2018   ALT 40 02/06/2018   AST 78 (H) 02/06/2018   NA 137 08/03/2018   K 3.6 08/03/2018   CL 100 08/03/2018   CREATININE 1.35 (H) 08/03/2018   BUN 18 08/03/2018   CO2 27 08/03/2018   TSH 1.953 02/06/2018   INR 1.24 02/06/2018   HGBA1C 5.5 02/06/2018   MICROALBUR 0.42 08/24/2008    POCT - A1c -  POCT glycosylated hemoglobin (Hb A1C)  Order: [id number]  Status:  Final result  Visible to patient:  No (Not Released) Dx:  Hyperglycemia   Ref Range & Units 15:53 89mo ago 64yr ago  Hemoglobin A1C 4.0 - 5.6 % 5.6  5.5 R, CM 6.0 R, CM        Declines other labs    Assessment & Plan:

## 2018-10-06 NOTE — Patient Instructions (Addendum)
You had the flu shot today  Your A1c was OK today (for blood sugar)  You will be contacted regarding the referral for: Plastic Surgury  Please continue all other medications as before, and refills have been done if requested.  Please have the pharmacy call with any other refills you may need.  Please continue your efforts at being more active, low cholesterol diet, and weight control.  You are otherwise up to date with prevention measures today.  Please keep your appointments with your specialists as you may have planned  Please return in 1 year for your yearly visit, or sooner if needed, with Lab testing done 3-5 days before

## 2018-10-17 DIAGNOSIS — G4733 Obstructive sleep apnea (adult) (pediatric): Secondary | ICD-10-CM | POA: Diagnosis not present

## 2018-10-21 ENCOUNTER — Encounter: Payer: Self-pay | Admitting: Plastic Surgery

## 2018-10-21 ENCOUNTER — Ambulatory Visit: Payer: BLUE CROSS/BLUE SHIELD | Admitting: Plastic Surgery

## 2018-10-21 VITALS — BP 138/82 | HR 86 | Ht 66.0 in | Wt 242.0 lb

## 2018-10-21 DIAGNOSIS — I10 Essential (primary) hypertension: Secondary | ICD-10-CM | POA: Diagnosis not present

## 2018-10-21 DIAGNOSIS — R600 Localized edema: Secondary | ICD-10-CM

## 2018-10-21 DIAGNOSIS — E881 Lipodystrophy, not elsewhere classified: Secondary | ICD-10-CM

## 2018-10-21 DIAGNOSIS — N182 Chronic kidney disease, stage 2 (mild): Secondary | ICD-10-CM

## 2018-10-21 DIAGNOSIS — E669 Obesity, unspecified: Secondary | ICD-10-CM

## 2018-10-21 NOTE — Progress Notes (Signed)
Patient ID: Steven Chase, male    DOB: 27-May-1986, 32 y.o.   MRN: [id number]   Chief Complaint  Patient presents with  . Advice Only    for liposuction    The patient is a 32 year old bm here for consultation for reduction of abdominal fat.  He is 5 feet 6 inches tall and 242 pounds.  He is not a smoker.  He underwent a inguinal hernia repair in the past.  He has not had any other surgery he has a history of hypertension, cardiomyopathy and obstructive sleep apnea as well as chronic kidney disease.  His ejection fraction is between 15 and 19%.  There is concerned about his surgical risk.  He wants to get healthier and feels that he needs some help in getting the weight off so that he can improve his overall health and symptoms.  He has a full abdomen and I am concerned that liposuction would not be significantly helpful in any way and would be risky and painful.  And abdominoplasty is premature at this time because of the amount of intra-abdominal fat he seems to have.  I do not feel any rectus diastases or hernias at this time.  We took pictures with the patient's consent and those are in the chart.   Review of Systems  Constitutional: Positive for activity change.  HENT: Negative.   Eyes: Negative.   Respiratory: Negative.   Cardiovascular: Positive for leg swelling.  Gastrointestinal: Negative for abdominal pain.  Endocrine: Negative.   Genitourinary: Negative.   Musculoskeletal: Negative.   Skin: Negative for color change and wound.  Neurological: Negative.   Psychiatric/Behavioral: Negative.     Past Medical History:  Diagnosis Date  . Hypertension   . Obesity   . OSA (obstructive sleep apnea) 11/05/2014   02/2015 -severe -AHI 106/h     Past Surgical History:  Procedure Laterality Date  . HERNIA REPAIR    . RIGHT/LEFT HEART CATH AND CORONARY ANGIOGRAPHY N/A 02/07/2018   Procedure: RIGHT/LEFT HEART CATH AND CORONARY ANGIOGRAPHY;  Surgeon: Marykay Lex, MD;  Location:  [facility] INVASIVE CV LAB;  Service: Cardiovascular;  Laterality: N/A;      Current Outpatient Medications:  .  carvedilol (COREG) 12.5 MG tablet, Take 1 tablet (12.5 mg total) by mouth 2 (two) times daily with a meal., Disp: 60 tablet, Rfl: 3 .  digoxin (LANOXIN) 0.125 MG tablet, Take 1 tablet (125 mcg total) by mouth daily., Disp: 30 tablet, Rfl: 3 .  furosemide (LASIX) 20 MG tablet, Take 1 tablet (20 mg total) by mouth daily., Disp: 30 tablet, Rfl: 6 .  isosorbide-hydrALAZINE (BIDIL) 20-37.5 MG tablet, Take 2 tablets by mouth 3 (three) times daily., Disp: 90 tablet, Rfl: 11 .  potassium chloride SA (K-DUR,KLOR-CON) 20 MEQ tablet, Take 2 tablets (40 mEq total) by mouth daily., Disp: 60 tablet, Rfl: 11 .  sacubitril-valsartan (ENTRESTO) 97-103 MG, Take 1 tablet by mouth 2 (two) times daily., Disp: 60 tablet, Rfl: 3 .  spironolactone (ALDACTONE) 25 MG tablet, Take 1 tablet (25 mg total) by mouth daily., Disp: 30 tablet, Rfl: 3   Objective:   Vitals:   10/21/18 1437  BP: 138/82  Pulse: 86  SpO2: 97%    Physical Exam Vitals signs and nursing note reviewed.  HENT:     Head: Normocephalic.  Neck:     Musculoskeletal: Normal range of motion.  Cardiovascular:     Rate and Rhythm: Normal rate.  Pulmonary:  Effort: Pulmonary effort is normal.  Abdominal:     General: There is no distension.     Palpations: There is no mass.     Tenderness: There is no abdominal tenderness.     Hernia: No hernia is present.  Skin:    General: Skin is warm.  Neurological:     General: No focal deficit present.     Mental Status: He is alert.     Assessment & Plan:  Obesity (BMI 30-39.9)  Lower leg edema  CKD (chronic kidney disease), stage II  Essential hypertension  Lipodystrophy  I had a very nice discussion with the patient regarding his desire to get healthy.  He seems to be very sincere.  He understands that plastic surgery would not be helpful yet.  He is willing to see Dr. Daphine DeutscherMartin for  possibility of gastric bypass and the healthy weight and wellness doctor and we will see if we can get that set up for him.  He understands that surgery may not be an option right now due to his heart status but would like to at least talk with the general surgeon to see if it may be an option in the future.  Alena Billslaire S Dillingham, DO

## 2018-11-17 DIAGNOSIS — G4733 Obstructive sleep apnea (adult) (pediatric): Secondary | ICD-10-CM | POA: Diagnosis not present

## 2018-11-25 NOTE — Progress Notes (Signed)
Advanced Heart Failure Clinic Note   PCP: Corwin Levins, MD PCP-Cardiologist: Kristeen Miss, MD  HF: Dr Gala Romney   HPI: Steven Chase is a 33 y.o. male with chronic systolic, NICM by cath, HTN, OSA, and obesity.   Pt admitted 4/5 - 02/09/18 with non-radiating R sided chest pain, SOB and diaphoresis. Echo with newly EF 15% so taken for cath which showed marginal cardiac output and normal coronaries. cMRI showed EF 19% small nodule questionable for sarcoidosis, but non-specific. Diuresed and meds adjusted as tolerated.  Suspected to be primarily HTN cardiomyopathy.   He returns for HF follow up. Last visit carvedilol was increased to 12.5 mg twice a day.  Home SBP 140-150. Overall feeling fine. Denies SOB/PND/Orthopnea. Appetite ok. Eating lots chips at work. Says he cant pack his lunch.  No fever or chills. Weight at home  pounds. Taking all medications but has not been taking potassium. Working 7 days a week.    Echo 07/27/18: EF 25%  Echo 02/05/2018 LVEF 15%, Grade 2 DD, Trivial AI, Mild MR, Mild LAE, PA peak pressure 38 mm Hg, small pericardial effusion noted.  L/RHC 02/07/18 - Normal Coronaries RHC Procedural Findings: Hemodynamics (mmHg) RA mean 9 RV 39/8 PA 34/24 (29) PCWP 23 AO 95/68 Cardiac Output (Fick) 3.34 Cardiac Index (Fick) 1.61  Echo 02/05/2018 LVEF 15%, Grade 2 DD, Trivial AI, Mild MR, Mild LAE, PA peak pressure 38 mm Hg, small pericardial effusion noted  cMRI 02/08/18 1. Moderately dilated LV with moderate concentric LV hypertrophy. EF 19% with diffuse hypokinesis. 2. Normal RV size and systolic function. 3. On delayed enhancement, there was nodular transmural LGE in a small area of the mid inferior wall as described above. There was a subepicardial extension of the LGE nodule. This does not appear to be a coronary disease pattern as it was very discrete. Cardiac sarcoidosis is a concern with the discrete nodule, though LGE is more typically basal septal or  inferolateral. LGE is not as diffuse as one would expect with myocarditis. The pattern is not suggestive of amyloidosis.  Review of systems complete and found to be negative unless listed in HPI.   Past Medical History:  Diagnosis Date  . Hypertension   . Obesity   . OSA (obstructive sleep apnea) 11/05/2014   02/2015 -severe -AHI 106/h     Current Outpatient Medications  Medication Sig Dispense Refill  . carvedilol (COREG) 12.5 MG tablet Take 1 tablet (12.5 mg total) by mouth 2 (two) times daily with a meal. 60 tablet 3  . digoxin (LANOXIN) 0.125 MG tablet Take 1 tablet (125 mcg total) by mouth daily. 30 tablet 3  . furosemide (LASIX) 20 MG tablet Take 1 tablet (20 mg total) by mouth daily. 30 tablet 6  . isosorbide-hydrALAZINE (BIDIL) 20-37.5 MG tablet Take 2 tablets by mouth 3 (three) times daily. 90 tablet 11  . sacubitril-valsartan (ENTRESTO) 97-103 MG Take 1 tablet by mouth 2 (two) times daily. 60 tablet 3  . spironolactone (ALDACTONE) 25 MG tablet Take 1 tablet (25 mg total) by mouth daily. 30 tablet 3   No current facility-administered medications for this encounter.     No Known Allergies    Social History   Socioeconomic History  . Marital status: Single    Spouse name: Not on file  . Number of children: 1  . Years of education: 14  . Highest education level: Not on file  Occupational History  . Occupation: Nurse, learning disability AND  [employer]  Social Needs  . Financial resource strain: Not on file  . Food insecurity:    Worry: Not on file    Inability: Not on file  . Transportation needs:    Medical: Not on file    Non-medical: Not on file  Tobacco Use  . Smoking status: Never Smoker  . Smokeless tobacco: Never Used  Substance and Sexual Activity  . Alcohol use: No  . Drug use: No  . Sexual activity: Not on file  Lifestyle  . Physical activity:    Days per week: Not on file    Minutes per session: Not on file  . Stress: Not on file  Relationships   . Social connections:    Talks on phone: Not on file    Gets together: Not on file    Attends religious service: Not on file    Active member of club or organization: Not on file    Attends meetings of clubs or organizations: Not on file    Relationship status: Not on file  . Intimate partner violence:    Fear of current or ex partner: Not on file    Emotionally abused: Not on file    Physically abused: Not on file    Forced sexual activity: Not on file  Other Topics Concern  . Not on file  Social History Narrative   Born and raised in [place], [place]. Currently live in a private residence with his mother. Fun: Play with his daughter.   Denies religious beliefs that would effect health care.    Family History  Problem Relation Age of Onset  . Hypertension Mother   . Heart disease Maternal Grandfather   . Hypertension Maternal Grandfather   . Hypertension Sister     Vitals:   11/28/18 1150  BP: (!) 158/120  Pulse: 100  SpO2: 92%  Weight: 113.7 kg (250 lb 9.6 oz)    Wt Readings from Last 3 Encounters:  11/28/18 113.7 kg (250 lb 9.6 oz)  10/21/18 109.8 kg (242 lb)  10/06/18 110.7 kg (244 lb)    PHYSICAL EXAM: General:  No resp difficulty HEENT: normal Neck: supple. Hard to assess due to body habitus.  Carotids 2+ bilat; no bruits. No lymphadenopathy or thryomegaly appreciated. Cor: PMI nondisplaced. Regular rate & rhythm. No rubs, gallops or murmurs. Lungs: clear Abdomen: soft, nontender, nondistended. No hepatosplenomegaly. No bruits or masses. Good bowel sounds. Extremities: no cyanosis, clubbing, rash,  R and LLE 1+  edema Neuro: alert & orientedx3, cranial nerves grossly intact. moves all 4 extremities w/o difficulty. Affect pleasant  EKG: NSR 91 bpm   ASSESSMENT & PLAN:  1. Chronic systolic CHF - NICM by cath 02/07/18, with low cardiac output. Suspected primarily HTN cardiomyopathy -Echo4/04/2018 LVEF 15%, Grade 2 DD, Trivial AI, Mild MR, Mild LAE, PA peak  pressure 38 mm Hg, small pericardial effusion noted. - cMRI 02/08/18 LVEF 19%, Normal RV, Discrete nodule in mid inferior wall concerning for possible Sarcoid involvement. No amyloid.  - Hgb A1C 5.5, TSH normal, AST mildly elevated, HIV negative. Hepatitis panel negative.  - Echo 07/2018  EF 25%. Referred to EP for ICD, but canceled appointment. He is not interested in ICD. - NYHA II. Volume status elevated in the setting of high sodium diet.  - Increase lasix to 40 mg daily.  - Continue Entresto 97/103 mg BID.  - Increase coreg to 18.75 mg twice a day.  - Continue Bidil 2 tab TID.  - Continue digoxin 0.125  mg daily. Dig level okay 01/2018. - Continue spiro 25 mg daily.  2. HTN Elevated. Increase lasix and coreg as noted above.   3 OSA, previous diagnosis - Sleep study 03/30/18 with severe OSA. AHI 81.4 with sats down to 63% - Following with Dr. Mayford Knifeurner.  - Continue CPAP nightly.    4. CKD II Check BMEt   5. Obesity - Body mass index is 40.45 kg/m.    Follow up In 3 weeks. Meds changes discussed.  Check BMET today.   Lennox GrumblesIAmy Clegg, NP 11/28/18

## 2018-11-28 ENCOUNTER — Encounter (HOSPITAL_COMMUNITY): Payer: Self-pay | Admitting: Cardiology

## 2018-11-28 ENCOUNTER — Encounter (HOSPITAL_COMMUNITY): Payer: Self-pay

## 2018-11-28 ENCOUNTER — Encounter (HOSPITAL_COMMUNITY): Payer: BLUE CROSS/BLUE SHIELD

## 2018-11-28 ENCOUNTER — Ambulatory Visit (HOSPITAL_COMMUNITY)
Admission: RE | Admit: 2018-11-28 | Discharge: 2018-11-28 | Disposition: A | Discharge status: Home / Self Care | Payer: BLUE CROSS/BLUE SHIELD | Source: Ambulatory Visit | Attending: Cardiology | Admitting: Cardiology

## 2018-11-28 VITALS — BP 158/120 | HR 100 | Wt 250.6 lb

## 2018-11-28 DIAGNOSIS — Z8249 Family history of ischemic heart disease and other diseases of the circulatory system: Secondary | ICD-10-CM | POA: Insufficient documentation

## 2018-11-28 DIAGNOSIS — I130 Hypertensive heart and chronic kidney disease with heart failure and stage 1 through stage 4 chronic kidney disease, or unspecified chronic kidney disease: Secondary | ICD-10-CM | POA: Insufficient documentation

## 2018-11-28 DIAGNOSIS — I429 Cardiomyopathy, unspecified: Secondary | ICD-10-CM

## 2018-11-28 DIAGNOSIS — Z6841 Body Mass Index (BMI) 40.0 and over, adult: Secondary | ICD-10-CM | POA: Insufficient documentation

## 2018-11-28 DIAGNOSIS — I428 Other cardiomyopathies: Secondary | ICD-10-CM | POA: Diagnosis not present

## 2018-11-28 DIAGNOSIS — G4733 Obstructive sleep apnea (adult) (pediatric): Secondary | ICD-10-CM | POA: Diagnosis not present

## 2018-11-28 DIAGNOSIS — I10 Essential (primary) hypertension: Secondary | ICD-10-CM | POA: Diagnosis not present

## 2018-11-28 DIAGNOSIS — I5022 Chronic systolic (congestive) heart failure: Secondary | ICD-10-CM | POA: Insufficient documentation

## 2018-11-28 DIAGNOSIS — Z79899 Other long term (current) drug therapy: Secondary | ICD-10-CM | POA: Diagnosis not present

## 2018-11-28 DIAGNOSIS — N182 Chronic kidney disease, stage 2 (mild): Secondary | ICD-10-CM | POA: Diagnosis not present

## 2018-11-28 DIAGNOSIS — E669 Obesity, unspecified: Secondary | ICD-10-CM | POA: Diagnosis not present

## 2018-11-28 LAB — BASIC METABOLIC PANEL
Anion gap: 11 (ref 5–15)
BUN: 13 mg/dL (ref 6–20)
CALCIUM: 9.1 mg/dL (ref 8.9–10.3)
CO2: 27 mmol/L (ref 22–32)
CREATININE: 1.26 mg/dL — AB (ref 0.61–1.24)
Chloride: 100 mmol/L (ref 98–111)
GLUCOSE: 110 mg/dL — AB (ref 70–99)
Potassium: 3.1 mmol/L — ABNORMAL LOW (ref 3.5–5.1)
Sodium: 138 mmol/L (ref 135–145)

## 2018-11-28 MED ORDER — FUROSEMIDE 20 MG PO TABS: 20.0000 mg | ORAL_TABLET | Freq: Every day | ORAL | 6 refills | Status: DC

## 2018-11-28 MED ORDER — CARVEDILOL 12.5 MG PO TABS: 18.7500 mg | ORAL_TABLET | Freq: Two times a day (BID) | ORAL | 3 refills | Status: DC

## 2018-11-28 NOTE — Patient Instructions (Signed)
INCREASE Lasix to 40 mg, (two tabs) daily INCREASE Carvedilol to 18.75 mg, (one and one half tab) twice a day  Labs today We will only contact you if something comes back abnormal or we need to make some changes. Otherwise no news is good news!   Your physician recommends that you schedule a follow-up appointment in: 3 weeks with  in the Advanced Practitioners (PA/NP) Clinic     Do the following things EVERYDAY: 1) Weigh yourself in the morning before breakfast. Write it down and keep it in a log. 2) Take your medicines as prescribed 3) Eat low salt foods-Limit salt (sodium) to 2000 mg per day.  4) Stay as active as you can everyday 5) Limit all fluids for the day to less than 2 liters

## 2018-11-28 NOTE — Progress Notes (Signed)
Work letter

## 2018-12-02 ENCOUNTER — Ambulatory Visit: Payer: BLUE CROSS/BLUE SHIELD | Admitting: Internal Medicine

## 2018-12-02 ENCOUNTER — Telehealth (HOSPITAL_COMMUNITY): Payer: Self-pay | Admitting: Cardiology

## 2018-12-02 DIAGNOSIS — I5022 Chronic systolic (congestive) heart failure: Secondary | ICD-10-CM

## 2018-12-02 MED ORDER — POTASSIUM CHLORIDE CRYS ER 20 MEQ PO TBCR: 40.0000 meq | EXTENDED_RELEASE_TABLET | Freq: Every day | ORAL | 3 refills | Status: DC

## 2018-12-02 NOTE — Telephone Encounter (Signed)
-----   Message from Sherald Hess, NP sent at 11/28/2018  2:41 PM EST ----- Please call instruct to start 40 meq K Dur daily. Repeat BMET in 1 week.

## 2018-12-02 NOTE — Telephone Encounter (Signed)
Notes recorded by Theresia Bough, CMA on 12/02/2018 at 1:52 PM EST Patient aware. Patient voiced understanding, repeat lab 2/7  ------  Notes recorded by Theresia Bough, CMA on 12/01/2018 at 4:00 PM EST Left message for patient to call back.  [phone] (M) ------  Notes recorded by Theresia Bough, CMA on 11/28/2018 at 2:55 PM EST Left message for patient to call back.  [phone] (M) ------  Notes recorded by Sherald Hess, NP on 11/28/2018 at 2:41 PM EST Please call instruct to start 40 meq K Dur daily. Repeat BMET in 1 week.

## 2018-12-09 ENCOUNTER — Other Ambulatory Visit (HOSPITAL_COMMUNITY): Payer: BLUE CROSS/BLUE SHIELD

## 2018-12-16 NOTE — Progress Notes (Signed)
Advanced Heart Failure Clinic Note   PCP: Corwin Levins, MD PCP-Cardiologist: Kristeen Miss, MD  HF: Dr Gala Romney   HPI: Steven Chase is a 33 y.o. male with chronic systolic, NICM by cath, HTN, OSA, and obesity.   Pt admitted 4/5 - 02/09/18 with non-radiating R sided chest pain, SOB and diaphoresis. Echo with newly EF 15% so taken for cath which showed marginal cardiac output and normal coronaries. cMRI showed EF 19% small nodule questionable for sarcoidosis, but non-specific. Diuresed and meds adjusted as tolerated.  Suspected to be primarily HTN cardiomyopathy.   He returns today for HF follow up. Last visit coreg and lasix were increased. Overall doing fine. Denies SOB. No edema, orthopnea, or PND. No CP or dizziness. Energy and appetite fine. Wearing CPAP qHS. Weights trending down at home with lasix increase 250 -> 245 lbs. BP 140/90s at home. He walked to clinic today from home and thinks that's why his BP is elevated. Taking all medications. No longer eating lots of chips. Limits fluid and salt intake. Took all meds this am. Works 7 days/week at [employer].   Echo 07/27/18: EF 25%  Echo 02/05/2018 LVEF 15%, Grade 2 DD, Trivial AI, Mild MR, Mild LAE, PA peak pressure 38 mm Hg, small pericardial effusion noted.  L/RHC 02/07/18 - Normal Coronaries RHC Procedural Findings: Hemodynamics (mmHg) RA mean 9 RV 39/8 PA 34/24 (29) PCWP 23 AO 95/68 Cardiac Output (Fick) 3.34 Cardiac Index (Fick) 1.61  Echo 02/05/2018 LVEF 15%, Grade 2 DD, Trivial AI, Mild MR, Mild LAE, PA peak pressure 38 mm Hg, small pericardial effusion noted  cMRI 02/08/18 1. Moderately dilated LV with moderate concentric LV hypertrophy. EF 19% with diffuse hypokinesis. 2. Normal RV size and systolic function. 3. On delayed enhancement, there was nodular transmural LGE in a small area of the mid inferior wall as described above. There was a subepicardial extension of the LGE nodule. This does not appear to be a  coronary disease pattern as it was very discrete. Cardiac sarcoidosis is a concern with the discrete nodule, though LGE is more typically basal septal or inferolateral. LGE is not as diffuse as one would expect with myocarditis. The pattern is not suggestive of amyloidosis.  Review of systems complete and found to be negative unless listed in HPI.   Past Medical History:  Diagnosis Date  . Hypertension   . Obesity   . OSA (obstructive sleep apnea) 11/05/2014   02/2015 -severe -AHI 106/h     Current Outpatient Medications  Medication Sig Dispense Refill  . carvedilol (COREG) 25 MG tablet Take 1 tablet (25 mg total) by mouth 2 (two) times daily with a meal. 60 tablet 6  . digoxin (LANOXIN) 0.125 MG tablet Take 1 tablet (125 mcg total) by mouth daily. 30 tablet 3  . furosemide (LASIX) 20 MG tablet Take 1 tablet (20 mg total) by mouth daily. (Patient taking differently: Take 40 mg by mouth daily. ) 60 tablet 6  . isosorbide-hydrALAZINE (BIDIL) 20-37.5 MG tablet Take 2 tablets by mouth 3 (three) times daily. 90 tablet 11  . potassium chloride SA (K-DUR,KLOR-CON) 20 MEQ tablet Take 2 tablets (40 mEq total) by mouth daily. 60 tablet 3  . sacubitril-valsartan (ENTRESTO) 97-103 MG Take 1 tablet by mouth 2 (two) times daily. 60 tablet 3  . spironolactone (ALDACTONE) 25 MG tablet Take 1 tablet (25 mg total) by mouth daily. 30 tablet 3   No current facility-administered medications for this encounter.  No Known Allergies    Social History   Socioeconomic History  . Marital status: Single    Spouse name: Not on file  . Number of children: 1  . Years of education: 36  . Highest education level: Not on file  Occupational History  . Occupation: Therapist, music: [employer]  Social Needs  . Financial resource strain: Not on file  . Food insecurity:    Worry: Not on file    Inability: Not on file  . Transportation needs:    Medical: Not on file    Non-medical: Not on file    Tobacco Use  . Smoking status: Never Smoker  . Smokeless tobacco: Never Used  Substance and Sexual Activity  . Alcohol use: No  . Drug use: No  . Sexual activity: Not on file  Lifestyle  . Physical activity:    Days per week: Not on file    Minutes per session: Not on file  . Stress: Not on file  Relationships  . Social connections:    Talks on phone: Not on file    Gets together: Not on file    Attends religious service: Not on file    Active member of club or organization: Not on file    Attends meetings of clubs or organizations: Not on file    Relationship status: Not on file  . Intimate partner violence:    Fear of current or ex partner: Not on file    Emotionally abused: Not on file    Physically abused: Not on file    Forced sexual activity: Not on file  Other Topics Concern  . Not on file  Social History Narrative   Born and raised in [place], [place]. Currently live in a private residence with his mother. Fun: Play with his daughter.   Denies religious beliefs that would effect health care.    Family History  Problem Relation Age of Onset  . Hypertension Mother   . Heart disease Maternal Grandfather   . Hypertension Maternal Grandfather   . Hypertension Sister     Vitals:   12/19/18 0828  BP: (!) 154/118  Pulse: 95  SpO2: 95%  Weight: 114 kg (251 lb 6.4 oz)    Wt Readings from Last 3 Encounters:  12/19/18 114 kg (251 lb 6.4 oz)  11/28/18 113.7 kg (250 lb 9.6 oz)  10/21/18 109.8 kg (242 lb)    PHYSICAL EXAM: General:No resp difficulty. Obese. Walked into clinic without difficulty.  HEENT: Normal Neck: Supple. JVP difficult. Carotids 2+ bilat; no bruits. No thyromegaly or nodule noted. Cor: PMI nondisplaced. Regular, slightly tachycardic, No M/G/R noted Lungs: CTAB, normal effort. Abdomen: Soft, non-tender, non-distended, no HSM. No bruits or masses. +BS  Extremities: No cyanosis, clubbing, or rash. R and LLE no edema.  Neuro: Alert & orientedx3,  cranial nerves grossly intact. moves all 4 extremities w/o difficulty. Affect pleasant   EKG: NSR 92 bpm with LVH. Personally reviewed.   ASSESSMENT & PLAN:  1. Chronic systolic CHF - NICM by cath 02/07/18, with low cardiac output. Suspected primarily HTN cardiomyopathy -Echo4/04/2018 LVEF 15%, Grade 2 DD, Trivial AI, Mild MR, Mild LAE, PA peak pressure 38 mm Hg, small pericardial effusion noted. - cMRI 02/08/18 LVEF 19%, Normal RV, Discrete nodule in mid inferior wall concerning for possible Sarcoid involvement. No amyloid.  - Hgb A1C 5.5, TSH normal, AST mildly elevated, HIV negative. Hepatitis panel negative.  - Echo 07/2018  EF 25%. Referred  to EP for ICD, but canceled appointment. He is not interested in ICD. - NYHA II. Volume status stable - Continue lasix 40 mg daily + 40 meq K daily. BMET today. - Continue Entresto 97/103 mg BID.  - Increase coreg to 25 mg BID.  - Continue Bidil 2 tab TID.  - Continue digoxin 0.125 mg daily. Check dig level today.  - Continue spiro 25 mg daily.  2. HTN - Elevated today. He walked to the hospital from his home. Equal in both arms. Goal BP is <130/80. - BP is 140/90s at home. Increase coreg as above.  - I asked him to keep a daily log of BP at home and to call us if still running >130/80 on home checks. He is now on max doses of HF medications. May need to add amlodipine vs cardura.  3 OSA, previous diagnosis - Sleep study 03/30/18 with severe OSA. AHI 81.4 with sats down to 63% - Following with Dr. Mayford Knife.  - Continue CPAP nightly.  No change.   4. CKD II - Check BMET with diuretic change last visit.   5. Obesity - Body mass index is 40.58 kg/m.   BMET, dig level today Increase coreg to 25 mg daily Keep BP log and call us if BP still >130/80 at home. Provided note to excuse him from work today.   Alford Highland, NP 12/19/18   Greater than 50% of the 25 minute visit was spent in counseling/coordination of care regarding disease state  education, salt/fluid restriction, sliding scale diuretics, and medication compliance.

## 2018-12-18 DIAGNOSIS — G4733 Obstructive sleep apnea (adult) (pediatric): Secondary | ICD-10-CM | POA: Diagnosis not present

## 2018-12-19 ENCOUNTER — Ambulatory Visit (HOSPITAL_COMMUNITY)
Admission: RE | Admit: 2018-12-19 | Discharge: 2018-12-19 | Disposition: A | Discharge status: Home / Self Care | Payer: BLUE CROSS/BLUE SHIELD | Source: Ambulatory Visit | Attending: Internal Medicine | Admitting: Internal Medicine

## 2018-12-19 ENCOUNTER — Encounter (HOSPITAL_COMMUNITY): Payer: Self-pay

## 2018-12-19 ENCOUNTER — Other Ambulatory Visit: Payer: Self-pay

## 2018-12-19 ENCOUNTER — Telehealth (HOSPITAL_COMMUNITY): Payer: Self-pay

## 2018-12-19 VITALS — BP 154/118 | HR 95 | Wt 251.4 lb

## 2018-12-19 DIAGNOSIS — N182 Chronic kidney disease, stage 2 (mild): Secondary | ICD-10-CM | POA: Diagnosis not present

## 2018-12-19 DIAGNOSIS — I10 Essential (primary) hypertension: Secondary | ICD-10-CM | POA: Diagnosis not present

## 2018-12-19 DIAGNOSIS — I2510 Atherosclerotic heart disease of native coronary artery without angina pectoris: Secondary | ICD-10-CM | POA: Insufficient documentation

## 2018-12-19 DIAGNOSIS — Z8249 Family history of ischemic heart disease and other diseases of the circulatory system: Secondary | ICD-10-CM | POA: Insufficient documentation

## 2018-12-19 DIAGNOSIS — I5022 Chronic systolic (congestive) heart failure: Secondary | ICD-10-CM

## 2018-12-19 DIAGNOSIS — Z79899 Other long term (current) drug therapy: Secondary | ICD-10-CM | POA: Insufficient documentation

## 2018-12-19 DIAGNOSIS — G4733 Obstructive sleep apnea (adult) (pediatric): Secondary | ICD-10-CM

## 2018-12-19 DIAGNOSIS — Z6841 Body Mass Index (BMI) 40.0 and over, adult: Secondary | ICD-10-CM | POA: Diagnosis not present

## 2018-12-19 DIAGNOSIS — I130 Hypertensive heart and chronic kidney disease with heart failure and stage 1 through stage 4 chronic kidney disease, or unspecified chronic kidney disease: Secondary | ICD-10-CM | POA: Diagnosis not present

## 2018-12-19 DIAGNOSIS — E669 Obesity, unspecified: Secondary | ICD-10-CM | POA: Insufficient documentation

## 2018-12-19 DIAGNOSIS — Z6839 Body mass index (BMI) 39.0-39.9, adult: Secondary | ICD-10-CM

## 2018-12-19 LAB — BASIC METABOLIC PANEL
ANION GAP: 12 (ref 5–15)
BUN: 16 mg/dL (ref 6–20)
CALCIUM: 9 mg/dL (ref 8.9–10.3)
CO2: 27 mmol/L (ref 22–32)
CREATININE: 1.25 mg/dL — AB (ref 0.61–1.24)
Chloride: 98 mmol/L (ref 98–111)
Glucose, Bld: 108 mg/dL — ABNORMAL HIGH (ref 70–99)
Potassium: 3 mmol/L — ABNORMAL LOW (ref 3.5–5.1)
SODIUM: 137 mmol/L (ref 135–145)

## 2018-12-19 LAB — DIGOXIN LEVEL: Digoxin Level: 0.3 ng/mL — ABNORMAL LOW (ref 0.8–2.0)

## 2018-12-19 MED ORDER — POTASSIUM CHLORIDE CRYS ER 20 MEQ PO TBCR: 60.0000 meq | EXTENDED_RELEASE_TABLET | Freq: Every day | ORAL | 3 refills | Status: DC

## 2018-12-19 MED ORDER — CARVEDILOL 25 MG PO TABS: 25.0000 mg | ORAL_TABLET | Freq: Two times a day (BID) | ORAL | 6 refills | Status: DC

## 2018-12-19 NOTE — Telephone Encounter (Signed)
-----   Message from Alford Highland, NP sent at 12/19/2018 11:39 AM EST ----- Can you please call and verify his potassium/lasix doses? He was not sure on the doses today in clinic. He is supposed to take lasix 40 mg daily and potassium 40 meq daily. If he is taking correctly, have him take an extra 40 meq of potassium today and then increase potassium to 60 meq daily and recheck BMET in 1 week. Thanks!

## 2018-12-19 NOTE — Telephone Encounter (Signed)
Pt dose verified, he is taking lasix 40mg  and Potassium .  He was made aware of lab results and recommendations to take extra KCL 40 meq ( 2 tabs) today, THEN take (3 tabs) daily. He reports understanding and will RTC next Tuesday for lab work.

## 2018-12-19 NOTE — Patient Instructions (Signed)
INCREASE Coreg to 25 mg, one tab twice a day  Your physician has requested that you regularly monitor and record your blood pressure readings at home. Please use the same machine at the same time of day to check your readings and record them to bring to your follow-up visit. Please monitor your blood pressure for one week, give our office a call if your readings are above 130/90   Labs today We will only contact you if something comes back abnormal or we need to make some changes. Otherwise no news is good news!  Your physician recommends that you schedule a follow-up appointment in: 6 weeks  in the [facility] Practition[facility] (PA/NP) Clinic    Do the following things EVERYDAY: 1) Weigh yourself in the morning before breakfast. Write it down and keep it in a log. 2) Take your medicines as prescribed 3) Eat low salt foods-Limit salt (sodium) to 2000 mg per day.  4) Stay as active as you can everyday 5) Limit all fluids for the day to less than 2 liters

## 2018-12-22 NOTE — Progress Notes (Signed)
Error

## 2018-12-27 ENCOUNTER — Other Ambulatory Visit (HOSPITAL_COMMUNITY): Payer: BLUE CROSS/BLUE SHIELD

## 2019-01-16 DIAGNOSIS — G4733 Obstructive sleep apnea (adult) (pediatric): Secondary | ICD-10-CM | POA: Diagnosis not present

## 2019-01-30 ENCOUNTER — Encounter (HOSPITAL_COMMUNITY): Payer: BLUE CROSS/BLUE SHIELD

## 2019-02-17 ENCOUNTER — Encounter (HOSPITAL_COMMUNITY): Payer: Self-pay | Admitting: Emergency Medicine

## 2019-02-17 ENCOUNTER — Other Ambulatory Visit: Payer: Self-pay

## 2019-02-17 ENCOUNTER — Emergency Department (HOSPITAL_COMMUNITY): Payer: BLUE CROSS/BLUE SHIELD

## 2019-02-17 ENCOUNTER — Emergency Department (HOSPITAL_COMMUNITY)
Admission: EM | Admit: 2019-02-17 | Discharge: 2019-02-17 | Disposition: A | Discharge status: Home / Self Care | Payer: BLUE CROSS/BLUE SHIELD | Attending: Emergency Medicine | Admitting: Emergency Medicine

## 2019-02-17 DIAGNOSIS — S99921A Unspecified injury of right foot, initial encounter: Secondary | ICD-10-CM | POA: Diagnosis not present

## 2019-02-17 DIAGNOSIS — I5022 Chronic systolic (congestive) heart failure: Secondary | ICD-10-CM | POA: Diagnosis not present

## 2019-02-17 DIAGNOSIS — I10 Essential (primary) hypertension: Secondary | ICD-10-CM | POA: Diagnosis not present

## 2019-02-17 DIAGNOSIS — S99911A Unspecified injury of right ankle, initial encounter: Secondary | ICD-10-CM | POA: Diagnosis not present

## 2019-02-17 DIAGNOSIS — Z79899 Other long term (current) drug therapy: Secondary | ICD-10-CM | POA: Insufficient documentation

## 2019-02-17 DIAGNOSIS — I130 Hypertensive heart and chronic kidney disease with heart failure and stage 1 through stage 4 chronic kidney disease, or unspecified chronic kidney disease: Secondary | ICD-10-CM | POA: Insufficient documentation

## 2019-02-17 DIAGNOSIS — M25571 Pain in right ankle and joints of right foot: Secondary | ICD-10-CM

## 2019-02-17 DIAGNOSIS — N183 Chronic kidney disease, stage 3 (moderate): Secondary | ICD-10-CM | POA: Insufficient documentation

## 2019-02-17 DIAGNOSIS — R030 Elevated blood-pressure reading, without diagnosis of hypertension: Secondary | ICD-10-CM

## 2019-02-17 DIAGNOSIS — M79671 Pain in right foot: Secondary | ICD-10-CM | POA: Diagnosis not present

## 2019-02-17 MED ORDER — HYDRALAZINE HCL 20 MG/ML IJ SOLN
10.0000 mg | Freq: Once | INTRAMUSCULAR | Status: AC
  Administered 2019-02-17: 10 mg via INTRAMUSCULAR
  Filled 2019-02-17: qty 1

## 2019-02-17 NOTE — ED Triage Notes (Signed)
Pt c/o twisting right ankle yesterday. Pt ambulatory with limp from lobby.

## 2019-02-17 NOTE — ED Provider Notes (Signed)
[facility]-EMERGENCY DEPT Provider Note   CSN: [id number] Arrival date & time: 02/17/19  2409  History   Chief Complaint Right ankle pain  HPI Steven Chase is a 33 y.o. male with past medical history signifcicant for hypertension, obesity, cardiomyopathy, CKD who presents for evaluation of right ankle and foot pain.  Patient states he was out in his yard yesterday when his foot fell into a hole in his yard.  Patient states he inverted his right ankle.  Patient states he felt immediate pain to the right lateral side of his ankle as well as over the lateral portion of his foot.  Has been able to ambulate, however with pain.  He rates his pain a 7/10.  Pain does not radiate.  Patient states he has noted some mild swelling to his foot without redness, warmth or bruising.  Patient states he has been trying to elevate his right lower extremity and he has placed an Ace wrap to this area.  He has taken Tylenol for his pain.  His last Tylenol was at 5 AM this morning.  Denies fever, chills, nausea, vomiting, headache, neck pain, neck stiffness, cough, shortness of breath, chest pain, abdominal pain, diarrhea, dysuria, numbness or tingling in his extremities, decreased range of motion, rashes, lesions.  Denies additional aggravating or alleviating factors. Denies hitting head or LOC.  Patient was noted to be hypertensive on initial evaluation.  He denies any symptoms.  Patient states he did take his blood pressure medication at 630 this morning.  Patient states he does take his blood pressure every "few days."  States his blood pressures range 160s over 100s normally.  He is followed by Dr. Gala Romney, Cardiology for his hypertension and chronic systolic heart failure.  History obtained from patient.  No interpreter was used.     HPI  Past Medical History:  Diagnosis Date   Hypertension    Obesity    OSA (obstructive sleep apnea) 11/05/2014   02/2015 -severe -AHI 106/h      Patient Active Problem List   Diagnosis Date Noted   Lipodystrophy 10/21/2018   Obesity (BMI 30-39.9) 10/06/2018   Chronic systolic heart failure (HCC) 02/17/2018   Fatigue 02/17/2018   Cardiomyopathy (HCC) 02/11/2018   Hypertensive crisis 02/04/2018   Chest pain 02/04/2018   CKD (chronic kidney disease), stage II 02/04/2018   Allergic rhinitis 01/21/2018   Hyperglycemia 12/02/2017   Hypokalemia 12/02/2017   Cough 12/02/2017   Patellar tendonitis of left knee 12/02/2017   Preventative health care 12/10/2016   Obesity 05/25/2016   Lower leg edema 12/12/2014   Essential hypertension 11/05/2014   OSA (obstructive sleep apnea) 11/05/2014    Past Surgical History:  Procedure Laterality Date   HERNIA REPAIR     RIGHT/LEFT HEART CATH AND CORONARY ANGIOGRAPHY N/A 02/07/2018   Procedure: RIGHT/LEFT HEART CATH AND CORONARY ANGIOGRAPHY;  Surgeon: Marykay Lex, MD;  Location: [facility] INVASIVE CV LAB;  Service: Cardiovascular;  Laterality: N/A;        Home Medications    Prior to Admission medications   Medication Sig Start Date End Date Taking? Authorizing Provider  carvedilol (COREG) 25 MG tablet Take 1 tablet (25 mg total) by mouth 2 (two) times daily with a meal. 12/19/18   Alford Highland, NP  digoxin (LANOXIN) 0.125 MG tablet Take 1 tablet (125 mcg total) by mouth daily. 07/12/18   Graciella Freer, PA-C  furosemide (LASIX) 20 MG tablet Take 1 tablet (20 mg total) by  mouth daily. Patient taking differently: Take 40 mg by mouth daily.  11/28/18 02/26/19  Clegg, Amy D, NP  isosorbide-hydrALAZINE (BIDIL) 20-37.5 MG tablet Take 2 tablets by mouth 3 (three) times daily. 04/28/18   Bensimhon, Bevelyn Buckles, MD  potassium chloride SA (K-DUR,KLOR-CON) 20 MEQ tablet Take 3 tablets (60 mEq total) by mouth daily. 12/19/18   Alford Highland, NP  sacubitril-valsartan (ENTRESTO) 97-103 MG Take 1 tablet by mouth 2 (two) times daily. 09/26/18   Alford Highland, NP   spironolactone (ALDACTONE) 25 MG tablet Take 1 tablet (25 mg total) by mouth daily. 07/12/18   Graciella Freer, PA-C    Family History Family History  Problem Relation Age of Onset   Hypertension Mother    Heart disease Maternal Grandfather    Hypertension Maternal Grandfather    Hypertension Sister     Social History Social History   Tobacco Use   Smoking status: Never Smoker   Smokeless tobacco: Never Used  Substance Use Topics   Alcohol use: No   Drug use: No     Allergies   Patient has no known allergies.   Review of Systems Review of Systems  Constitutional: Negative.   HENT: Negative.   Respiratory: Negative.   Cardiovascular: Negative.   Gastrointestinal: Negative.   Musculoskeletal: Positive for gait problem.       Right foot pain and ankle pain  Skin: Negative.   All other systems reviewed and are negative.    Physical Exam Updated Vital Signs BP (!) 180/105    Pulse (!) 52    Temp 98.2 F (36.8 C) (Oral)    Resp 18    SpO2 95%   Physical Exam Vitals signs and nursing note reviewed.  Constitutional:      General: He is not in acute distress.    Appearance: He is well-developed. He is not ill-appearing, toxic-appearing or diaphoretic.  HENT:     Head: Atraumatic.     Nose: Nose normal.     Mouth/Throat:     Mouth: Mucous membranes are moist.     Pharynx: Oropharynx is clear.  Eyes:     Pupils: Pupils are equal, round, and reactive to light.  Neck:     Musculoskeletal: Normal range of motion and neck supple.     Comments: No JVD. Cardiovascular:     Rate and Rhythm: Normal rate and regular rhythm.     Pulses: Normal pulses.     Heart sounds: Normal heart sounds.     Comments: No murmurs, rubs or gallops. Pulmonary:     Effort: Pulmonary effort is normal. No respiratory distress.     Comments: Clear to auscultation bilateral wheeze, rhonchi or rales.  No accessory muscle usage.  Able speak in full sentences. Abdominal:      General: There is no distension.     Palpations: Abdomen is soft.     Comments: Soft, nontender without rebound or guarding.  Normoactive bowel sounds.  Musculoskeletal: Normal range of motion.     Comments: Mild soft tissue swelling to lateral malleolus on right ankle. TTP to right lateral malleolus and fifth meta tarsal.  Decreased range of motion with plantarflexion and dorsiflexion secondary to pain to right ankle.  Able to wiggle toes without difficulty.  No tenderness to shaft or proximal tibia, fibula.  Full range of motion bilateral knees.  No tenderness over pelvis or hips.  Left lower extremity full range of motion without difficulty.  No obvious deformity to bilateral  lower extremities.  No edema.  No evidence of fluid overload.  Lower extremity compartments are soft.  Skin:    General: Skin is warm and dry.     Comments: No edema, erythema, ecchymosis or warmth.  No rashes or lesions.  Brisk capillary refill.  Neurological:     Mental Status: He is alert.     Comments: No weakness of bilateral lower extremities.  Intact sensation to bilateral lower extremities.  Patient ambulatory with a limp to right ankle secondary to pain.  No facial droop.  Cranial nerves II through XII grossly intact.    ED Treatments / Results  Labs (all labs ordered are listed, but only abnormal results are displayed) Labs Reviewed - No data to display  EKG None  Radiology Dg Ankle Complete Right  Result Date: 02/17/2019 CLINICAL DATA:  Right ankle pain after fall yesterday. EXAM: RIGHT ANKLE - COMPLETE 3+ VIEW COMPARISON:  None. FINDINGS: There is no evidence of fracture, dislocation, or joint effusion. There is no evidence of arthropathy or other focal bone abnormality. Soft tissues are unremarkable. IMPRESSION: Negative. Electronically Signed   By: Lupita Raider M.D.   On: 02/17/2019 09:15   Dg Foot Complete Right  Result Date: 02/17/2019 CLINICAL DATA:  Right foot pain after injury yesterday.  EXAM: RIGHT FOOT COMPLETE - 3+ VIEW COMPARISON:  None. FINDINGS: There is no evidence of fracture or dislocation. There is no evidence of arthropathy or other focal bone abnormality. Soft tissues are unremarkable. IMPRESSION: Negative. Electronically Signed   By: Lupita Raider M.D.   On: 02/17/2019 09:18    Procedures Procedures (including critical care time)  Medications Ordered in ED Medications  hydrALAZINE (APRESOLINE) injection 10 mg (10 mg Intramuscular Given 02/17/19 0923)    Initial Impression / Assessment and Plan / ED Course  I have reviewed the triage vital signs and the nursing notes.  Pertinent labs & imaging results that were available during my care of the patient were reviewed by me and considered in my medical decision making (see chart for details).  33 year old male appears otherwise well presents for evaluation of right ankle pain.  Afebrile, nonseptic, non-ill-appearing.  Patient stepped into a hole and inverted his right ankle yesterday.  He has TTP to right lateral malleolus and over right fifth metatarsal.  He has decreased range of motion with plantarflexion dorsiflexion to right lower extremity secondary to pain.  Wiggles his toes without difficulty.  Mild soft tissue swelling over his right lateral malleolus.  He has no edema, erythema or warmth.  No rashes or lesions.  No breaks in skin.  No contusions or abrasions.  No obvious deformity or step-offs.  He has no tenderness to tibia, fibular shaft or proximal area.  He has full range of motion to his knee.  No tenderness over bilateral pelvis or hips.  Denies hitting head or LOC.  Plan for plain film right ankle and foot and reevaluate.  Low suspicion for septic joint, gout, hemarthrosis. Patient is noted to be severely hypertensive in department at 230/159.  Patient does have history of hypertension as well as chronic systolic heart failure.  He does not appear in fluid overload.  States he did take his blood pressure  medications this morning around 630.  He denies headache, neck pain, neck stiffness, nausea, vomiting, chest pain, shortness of breath, abdominal pain.  We will plan for manual recheck and reevaluate.  0840: Manual recheck of BP at 200/120.  Patient continues to be  asymptomatic.  He is lying flat on exam stretcher without any difficulty breathing.  He denies PND orthopnea.  No evidence of fluid overload on exam.  He is not tachycardic or tachypneic.  He has no hypoxia.  Will give hydralazine for blood pressure management and reevaluate.  0920: Plain film negative for fracture or dislocation.  No evidence of joint effusion. Will give ASO brace and reevaluate.  [id number]: Repeat manual blood pressure 180/105. Heart rate in 50s. Patient continues to be asymptomatic without headache, chest pain, shortness of breath, PND, orthopnea.  Discussed with patient to follow-up with cardiology for evaluation of his blood pressure. Patient able to ambulate in department without difficulty however with mild limp, does not want crutches for assisting with ambulation. RICE for symptomatic management.  Patient to follow up with orthopedics if he has continued symptoms.  Patient hemodynamically stable appropriate for DC him this time.  I have discussed return precautions.  Patient voiced understanding and is agreeable to follow-up.     Final Clinical Impressions(s) / ED Diagnoses   Final diagnoses:  Acute right ankle pain  Elevated blood pressure reading    ED Discharge Orders    None       Avana Kreiser A, PA-C 02/17/19 1005    Gerhard MunchLockwood, Robert, MD 02/17/19 1147

## 2019-02-17 NOTE — Discharge Instructions (Addendum)
You were evaluated today for ankle pain.  Your x-ray of your ankle and foot do not show any evidence of fracture or dislocation, however it is possible you have injured your tendon or ligament.  We have placed you in a brace.  I would suggest Tylenol as needed for pain.  I would also suggest ice, elevation of the extremity.  If you continue to have pain beyond 1 week out follow-up with orthopedics.  I have referred you to 1 your discharge paperwork.  You will need to call them to schedule an appointment.  You also had very elevated blood pressure in the department.  I would suggest following up with cardiology.  You develop chest pain, shortness of breath, difficulty breathing, elevation in your dry weight please seek reevaluation.  Return to the ED for any new or worsening symptoms.

## 2019-03-20 ENCOUNTER — Other Ambulatory Visit: Payer: Self-pay

## 2019-03-20 ENCOUNTER — Ambulatory Visit (HOSPITAL_COMMUNITY)
Admission: RE | Admit: 2019-03-20 | Discharge: 2019-03-20 | Disposition: A | Discharge status: Home / Self Care | Payer: BLUE CROSS/BLUE SHIELD | Source: Ambulatory Visit | Attending: Internal Medicine | Admitting: Internal Medicine

## 2019-03-20 VITALS — Wt 242.0 lb

## 2019-03-20 DIAGNOSIS — I130 Hypertensive heart and chronic kidney disease with heart failure and stage 1 through stage 4 chronic kidney disease, or unspecified chronic kidney disease: Secondary | ICD-10-CM

## 2019-03-20 DIAGNOSIS — I5022 Chronic systolic (congestive) heart failure: Secondary | ICD-10-CM

## 2019-03-20 DIAGNOSIS — N182 Chronic kidney disease, stage 2 (mild): Secondary | ICD-10-CM

## 2019-03-20 DIAGNOSIS — E669 Obesity, unspecified: Secondary | ICD-10-CM

## 2019-03-20 DIAGNOSIS — I10 Essential (primary) hypertension: Secondary | ICD-10-CM

## 2019-03-20 DIAGNOSIS — G4733 Obstructive sleep apnea (adult) (pediatric): Secondary | ICD-10-CM

## 2019-03-20 NOTE — Addendum Note (Signed)
Encounter addended by: Nicole Cella, RN on: 03/20/2019 2:35 PM  Actions taken: Order list changed, Diagnosis association updated, Clinical Note Signed

## 2019-03-20 NOTE — Progress Notes (Signed)
Spoke to pt. Verbalized understanding. avs to be mailed.

## 2019-03-20 NOTE — Patient Instructions (Signed)
Please have labs drawn this week. You will also have your blood pressure check during this appointment.  You have been referred to Cardiac Electrophysiology. Their office will contact you in order to set up an appointment.    Your physician has requested that you regularly monitor and record your blood pressure readings at home. Please use the same machine at the same time of day to check your readings and record them to bring to your follow-up visit. Please check your blood pressure at different times each day. Your goal is <130/80. If you notice that your blood pressure is consistently greater than your goal please call us and let us know. The office number is [phone] option #2.  Your physician has requested that you have an echocardiogram. Echocardiography is a painless test that uses sound waves to create images of your heart. It provides your doctor with information about the size and shape of your heart and how well your heart's chambers and valves are working. This procedure takes approximately one hour. There are no restrictions for this procedure. This will be done at your next appointment.  Please follow up with the Advanced Practice Provider in 3-4 months with an echocardiogram.

## 2019-03-20 NOTE — Progress Notes (Signed)
Heart Failure TeleHealth Note  Due to national recommendations of social distancing due to COVID 19, telehealth visit is felt to be most appropriate for this patient at this time.  I discussed the limitations, risks, security and privacy concerns of performing an evaluation and management service by telephone and the availability of in person appointments. I also discussed with the patient that there may be a patient responsible charge related to this service. The patient expressed understanding and agreed to proceed.   ID:  Steven Chase, DOB 02-Mar-1986, MRN [id number]  Location: Home  Provider location: [address] [place] Type of Visit: Established patient   PCP:  Corwin Levins, MD  Cardiologist:  Kristeen Miss, MD Primary HF: Dr Gala Romney  Chief Complaint: HF follow up   History of Present Illness: MALEEK Chase is a 33 y.o. male with a history of chronic systolic, NICM by cath, HTN, OSA, and obesity.   Pt admitted 4/5 - 02/09/18 with non-radiating R sided chest pain, SOB and diaphoresis. Echo with newly EF 15% so taken for cath which showed marginal cardiac output and normal coronaries. cMRI showed EF 19% small nodule questionable for sarcoidosis, but non-specific. Diuresed and meds adjusted as tolerated.  Suspected to be primarily HTN cardiomyopathy.   Patient presents via audio conferencing for a telehealth visit today. Last visit coreg was increased. SBP 150s before taking medications in the am. No SOB. Able to walk up hills with no problems. No edema, orthopnea, or PND. No dizziness or CP. Wearing CPAP qHS. Weights have been trending down, now 242 lbs. He is walking more and eating less. Taking all medications. Still working at [employer] and is provided a mask.     Pt denies symptoms of cough, fevers, chills, or new SOB worrisome for COVID 19.    Echo 07/27/18: EF 25%  Echo4/04/2018 LVEF 15%, Grade 2 DD, Trivial AI, Mild MR, Mild LAE, PA peak  pressure 38 mm Hg, small pericardial effusion noted.  [facility] 02/07/18 -Normal Coronaries [facility] Procedural Findings: Hemodynamics (mmHg) RA mean9 RV39/8 PA34/24 (29) PCWP23 AO95/68 Cardiac Output (Fick)3.34 Cardiac Index (Fick)1.61 Echo4/04/2018 LVEF 15%, Grade 2 DD, Trivial AI, Mild MR, Mild LAE, PA peak pressure 38 mm Hg, small pericardial effusion noted  cMRI 02/08/18 1. Moderately dilated LV with moderate concentric LV hypertrophy. EF 19% with diffuse hypokinesis. 2. Normal RV size and systolic function. 3. On delayed enhancement, there was nodular transmural LGE in a small area of the mid inferior wall as described above. There was a subepicardial extension of the LGE nodule. This does not appear to be a coronary disease pattern as it was very discrete. Cardiac sarcoidosis is a concern with the discrete nodule, though LGE is more typically basal septal or inferolateral. LGE is not as diffuse as one would expect with myocarditis. The pattern is not suggestive of amyloidosis.  Past Medical History:  Diagnosis Date   Hypertension    Obesity    OSA (obstructive sleep apnea) 11/05/2014   02/2015 -severe -AHI 106/h    Past Surgical History:  Procedure Laterality Date   HERNIA REPAIR     RIGHT/LEFT HEART CATH AND CORONARY ANGIOGRAPHY N/A 02/07/2018   Procedure: RIGHT/LEFT HEART CATH AND CORONARY ANGIOGRAPHY;  Surgeon: Marykay Lex, MD;  Location: [facility] INVASIVE CV LAB;  Service: Cardiovascular;  Laterality: N/A;     Current Outpatient Medications  Medication Sig Dispense Refill   carvedilol (COREG) 25 MG tablet Take 1 tablet (25  mg total) by mouth 2 (two) times daily with a meal. 60 tablet 6   digoxin (LANOXIN) 0.125 MG tablet Take 1 tablet (125 mcg total) by mouth daily. 30 tablet 3   furosemide (LASIX) 20 MG tablet Take 1 tablet (20 mg total) by mouth daily. 60 tablet 6   isosorbide-hydrALAZINE (BIDIL) 20-37.5 MG tablet Take 2 tablets by mouth 3 (three) times  daily. 90 tablet 11   potassium chloride SA (K-DUR,KLOR-CON) 20 MEQ tablet Take 3 tablets (60 mEq total) by mouth daily. 60 tablet 3   sacubitril-valsartan (ENTRESTO) 97-103 MG Take 1 tablet by mouth 2 (two) times daily. 60 tablet 3   spironolactone (ALDACTONE) 25 MG tablet Take 1 tablet (25 mg total) by mouth daily. 30 tablet 3   No current facility-administered medications for this encounter.     Allergies:   Patient has no known allergies.   Social History:  The patient  reports that he has never smoked. He has never used smokeless tobacco. He reports that he does not drink alcohol or use drugs.   Family History:  The patient's family history includes Heart disease in his maternal grandfather; Hypertension in his maternal grandfather, mother, and sister.   ROS:  Please see the history of present illness.   All other systems are personally reviewed and negative.    Exam:  (Video/Tele Health Call; Exam is subjective and or/visual.) General:  Speaks in full sentences. No resp difficulty. Lungs: Normal respiratory effort with conversation.  Abdomen: No distension per patient report Extremities: Pt denies edema. Neuro: Alert & oriented x 3.   Recent Labs: 08/03/2018: B Natriuretic Peptide 74.6 12/19/2018: BUN 16; Creatinine, Ser 1.25; Potassium 3.0; Sodium 137  Personally reviewed   Wt Readings from Last 3 Encounters:  03/20/19 109.8 kg (242 lb)  12/19/18 114 kg (251 lb 6.4 oz)  11/28/18 113.7 kg (250 lb 9.6 oz)      ASSESSMENT AND PLAN:  1. Chronic systolic CHF - NICM by cath 02/07/18, with low cardiac output. Suspected primarily HTN cardiomyopathy -Echo4/04/2018 LVEF 15%, Grade 2 DD, Trivial AI, Mild MR, Mild LAE, PA peak pressure 38 mm Hg, small pericardial effusion noted. - cMRI4/9/19 LVEF 19%, Normal RV, Discrete nodule in mid inferior wall concerning for possible Sarcoid involvement. No amyloid. - Hgb A1C 5.5, TSH normal, AST mildly elevated, HIV negative. Hepatitis  panel negative.  - Echo 07/2018  EF 25%. Referred to EP for ICD, but canceled appointment. He is not interested in ICD. - NYHA II. Volume status sounds stable - Continue lasix 20 mg daily + 60 meq K daily. Check BMET -ContinueEntresto97/103mg  BID.  - Continue coreg 25 mg BID.  - Continue Bidil 2 tab TID.  - Continue digoxin 0.125 mg daily. Check dig level.  - Continue spiro 25 mg daily. - We discussed ICD again today and he is now interested. QRS too narrow for CRT-D. Refer to EP for ICD consideration.   2. HTN - SBP 140-150s at home prior to taking medications - BP goal is <130/80. Discussed again today. I asked him to check BP after taking medications and to call us if running higher than goal. - Will check BP when he comes in for labs.   3 OSA - Sleep study 03/30/18 with severe OSA. AHI 81.4 with sats down to 63% - Following with Dr. Mayford Knife.  - Continue CPAP nightly. No change  4. CKD II - Check BMET.  5. Obesity - Losing weight now with dieting and increased exercise.  Congratulated.   COVID screen The patient does not have any symptoms that suggest any further testing/ screening at this time.  Social distancing reinforced today.  Patient Risk: After full review of this patients clinical status, I feel that they are at moderate risk for cardiac decompensation at this time.  Orders/Follow up: Check BMET and dig level. Follow up in 3-4 months with Echo. Refer to EP for ICD consideration.   Today, I have spent 13 minutes with the patient with telehealth technology discussing the above issues.Demetrio Lapping, NP  03/20/2019 1:43 PM   [facility] [address] [facility] [place] [address] [phone] (office) [phone] (fax)

## 2019-03-20 NOTE — Addendum Note (Signed)
Encounter addended by: Nicole Cella, RN on: 03/20/2019 2:44 PM  Actions taken: Clinical Note Signed

## 2019-03-29 ENCOUNTER — Telehealth: Payer: Self-pay | Admitting: Internal Medicine

## 2019-03-29 ENCOUNTER — Other Ambulatory Visit (HOSPITAL_COMMUNITY): Payer: BLUE CROSS/BLUE SHIELD

## 2019-03-29 NOTE — Telephone Encounter (Signed)
New message    Called pt to see about scheduling virtual visit with Dr. Ladona Ridgel per referral. If pt returns call, please reach out via secure chat. I will speak to pt.

## 2019-03-31 ENCOUNTER — Other Ambulatory Visit: Payer: Self-pay | Admitting: *Deleted

## 2019-03-31 NOTE — Telephone Encounter (Signed)
Virtual Visit Pre-Appointment Phone Call  "(Name), I am calling you today to discuss your upcoming appointment. We are currently trying to limit exposure to the virus that causes COVID-19 by seeing patients at home rather than in the office."  1. "What is the BEST phone number to call the day of the visit?" - include this in appointment notes  2. "Do you have or have access to (through a family member/friend) a smartphone with video capability that we can use for your visit?" a. If yes - list this number in appt notes as "cell" (if different from BEST phone #) and list the appointment type as a VIDEO visit in appointment notes b. If no - list the appointment type as a PHONE visit in appointment notes  3. Confirm consent - "In the setting of the current Covid19 crisis, you are scheduled for a (phone or video) visit with your provider on (date) at (time).  Just as we do with many in-office visits, in order for you to participate in this visit, we must obtain consent.  If you'd like, I can send this to your mychart (if signed up) or email for you to review.  Otherwise, I can obtain your verbal consent now.  All virtual visits are billed to your insurance company just like a normal visit would be.  By agreeing to a virtual visit, we'd like you to understand that the technology does not allow for your provider to perform an examination, and thus may limit your provider's ability to fully assess your condition. If your provider identifies any concerns that need to be evaluated in person, we will make arrangements to do so.  Finally, though the technology is pretty good, we cannot assure that it will always work on either your or our end, and in the setting of a video visit, we may have to convert it to a phone-only visit.  In either situation, we cannot ensure that we have a secure connection.  Are you willing to proceed?" STAFF: Did the patient verbally acknowledge consent to telehealth visit? Document  YES/NO here: yes   4. Advise patient to be prepared - "Two hours prior to your appointment, go ahead and check your blood pressure, pulse, oxygen saturation, and your weight (if you have the equipment to check those) and write them all down. When your visit starts, your provider will ask you for this information. If you have an Apple Watch or Kardia device, please plan to have heart rate information ready on the day of your appointment. Please have a pen and paper handy nearby the day of the visit as well."  5. Give patient instructions for MyChart download to smartphone OR Doximity/Doxy.me as below if video visit (depending on what platform provider is using)  6. Inform patient they will receive a phone call 15 minutes prior to their appointment time (may be from unknown caller ID) so they should be prepared to answer    TELEPHONE CALL NOTE  Steven Chase has been deemed a candidate for a follow-up tele-health visit to limit community exposure during the Covid-19 pandemic. I spoke with the patient via phone to ensure availability of phone/video source, confirm preferred email & phone number, and discuss instructions and expectations.  I reminded Steven Chase to be prepared with any vital sign and/or heart rhythm information that could potentially be obtained via home monitoring, at the time of his visit. I reminded Steven Chase to expect a phone call prior  to his visit.  [name] 03/31/2019 1:25 PM   INSTRUCTIONS FOR DOWNLOADING THE MYCHART APP TO SMARTPHONE  - The patient must first make sure to have activated MyChart and know their login information - If Apple, go to [employer] and type in MyChart in the search bar and download the app. If Android, ask patient to go to [employer] and type in [place] in the search bar and download the app. The app is free but as with any other app downloads, their phone may require them to verify saved payment information or Apple/Android  password.  - The patient will need to then log into the app with their MyChart username and password, and select [facility] as their healthcare provider to link the account. When it is time for your visit, go to the MyChart app, find appointments, and click Begin Video Visit. Be sure to Select Allow for your device to access the Microphone and Camera for your visit. You will then be connected, and your provider will be with you shortly.  **If they have any issues connecting, or need assistance please contact MyChart service desk [phone])**  **If using a computer, in order to ensure the best quality for their visit they will need to use either of the following Internet Browsers: [employer], or Google Chrome**  IF USING DOXIMITY or DOXY.ME - The patient will receive a link just prior to their visit by text.     FULL LENGTH CONSENT FOR TELE-HEALTH VISIT   I hereby voluntarily request, consent and authorize [facility] and its employed or contracted physicians, physician assistants, nurse practitioners or other licensed health care professionals (the Practitioner), to provide me with telemedicine health care services (the "Services") as deemed necessary by the treating Practitioner. I acknowledge and consent to receive the Services by the Practitioner via telemedicine. I understand that the telemedicine visit will involve communicating with the Practitioner through live audiovisual communication technology and the disclosure of certain medical information by electronic transmission. I acknowledge that I have been given the opportunity to request an in-person assessment or other available alternative prior to the telemedicine visit and am voluntarily participating in the telemedicine visit.  I understand that I have the right to withhold or withdraw my consent to the use of telemedicine in the course of my care at any time, without affecting my right to future care or treatment,  and that the Practitioner or I may terminate the telemedicine visit at any time. I understand that I have the right to inspect all information obtained and/or recorded in the course of the telemedicine visit and may receive copies of available information for a reasonable fee.  I understand that some of the potential risks of receiving the Services via telemedicine include:  Marland Kitchen Delay or interruption in medical evaluation due to technological equipment failure or disruption; . Information transmitted may not be sufficient (e.g. poor resolution of images) to allow for appropriate medical decision making by the Practitioner; and/or  . In rare instances, security protocols could fail, causing a breach of personal health information.  Furthermore, I acknowledge that it is my responsibility to provide information about my medical history, conditions and care that is complete and accurate to the best of my ability. I acknowledge that Practitioner's advice, recommendations, and/or decision may be based on factors not within their control, such as incomplete or inaccurate data provided by me or distortions of diagnostic images or specimens that may result from electronic transmissions. I  understand that the practice of medicine is not an exact science and that Practitioner makes no warranties or guarantees regarding treatment outcomes. I acknowledge that I will receive a copy of this consent concurrently upon execution via email to the email address I last provided but may also request a printed copy by calling the office of [facility].    I understand that my insurance will be billed for this visit.   I have read or had this consent read to me. . I understand the contents of this consent, which adequately explains the benefits and risks of the Services being provided via telemedicine.  . I have been provided ample opportunity to ask questions regarding this consent and the Services and have had my questions  answered to my satisfaction. . I give my informed consent for the services to be provided through the use of telemedicine in my medical care  By participating in this telemedicine visit I agree to the above.

## 2019-04-03 ENCOUNTER — Other Ambulatory Visit: Payer: Self-pay

## 2019-04-03 ENCOUNTER — Telehealth: Payer: Self-pay | Admitting: *Deleted

## 2019-04-03 ENCOUNTER — Encounter: Payer: BLUE CROSS/BLUE SHIELD | Admitting: Cardiology

## 2019-04-03 NOTE — Progress Notes (Signed)
This encounter was created in error - please disregard.

## 2019-04-03 NOTE — Telephone Encounter (Signed)
Virtual Visit Pre-Appointment Phone Call  "(Name), I am calling you today to discuss your upcoming appointment. We are currently trying to limit exposure to the virus that causes COVID-19 by seeing patients at home rather than in the office."  1. "What is the BEST phone number to call the day of the visit?" - include this in appointment notes  2. "Do you have or have access to (through a family member/friend) a smartphone with video capability that we can use for your visit?" a. If yes - list this number in appt notes as "cell" (if different from BEST phone #) and list the appointment type as a VIDEO visit in appointment notes b. If no - list the appointment type as a PHONE visit in appointment notes  3. Confirm consent - "In the setting of the current Covid19 crisis, you are scheduled for a (phone or video) visit with your provider on (date) at (time).  Just as we do with many in-office visits, in order for you to participate in this visit, we must obtain consent.  If you'd like, I can send this to your mychart (if signed up) or email for you to review.  Otherwise, I can obtain your verbal consent now.  All virtual visits are billed to your insurance company just like a normal visit would be.  By agreeing to a virtual visit, we'd like you to understand that the technology does not allow for your provider to perform an examination, and thus may limit your provider's ability to fully assess your condition. If your provider identifies any concerns that need to be evaluated in person, we will make arrangements to do so.  Finally, though the technology is pretty good, we cannot assure that it will always work on either your or our end, and in the setting of a video visit, we may have to convert it to a phone-only visit.  In either situation, we cannot ensure that we have a secure connection.  Are you willing to proceed?" STAFF: Did the patient verbally acknowledge consent to telehealth visit? Document  YES/NO here: YES  4. Advise patient to be prepared - "Two hours prior to your appointment, go ahead and check your blood pressure, pulse, oxygen saturation, and your weight (if you have the equipment to check those) and write them all down. When your visit starts, your provider will ask you for this information. If you have an Apple Watch or Kardia device, please plan to have heart rate information ready on the day of your appointment. Please have a pen and paper handy nearby the day of the visit as well."  5. Give patient instructions for MyChart download to smartphone OR Doximity/Doxy.me as below if video visit (depending on what platform provider is using)  6. Inform patient they will receive a phone call 15 minutes prior to their appointment time (may be from unknown caller ID) so they should be prepared to answer    TELEPHONE CALL NOTE  Steven Chase has been deemed a candidate for a follow-up tele-health visit to limit community exposure during the Covid-19 pandemic. I spoke with the patient via phone to ensure availability of phone/video source, confirm preferred email & phone number, and discuss instructions and expectations.  I reminded Steven Chase to be prepared with any vital sign and/or heart rhythm information that could potentially be obtained via home monitoring, at the time of his visit. I reminded Steven Chase to expect a phone call prior to  his visit.  Baird Lyons, RN 04/03/2019 9:17 AM   INSTRUCTIONS FOR DOWNLOADING THE MYCHART APP TO SMARTPHONE  - The patient must first make sure to have activated MyChart and know their login information - If Apple, go to [employer] and type in MyChart in the search bar and download the app. If Android, ask patient to go to [employer] and type in [place] in the search bar and download the app. The app is free but as with any other app downloads, their phone may require them to verify saved payment information or  [employer]/Android password.  - The patient will need to then log into the app with their MyChart username and password, and select  as their healthcare provider to link the account. When it is time for your visit, go to the MyChart app, find appointments, and click Begin Video Visit. Be sure to Select Allow for your device to access the Microphone and Camera for your visit. You will then be connected, and your provider will be with you shortly.  **If they have any issues connecting, or need assistance please contact MyChart service desk [phone])**  **If using a computer, in order to ensure the best quality for their visit they will need to use either of the following Internet Browsers: [employer], or Google Chrome**  IF USING DOXIMITY or DOXY.ME - The patient will receive a link just prior to their visit by text.     FULL LENGTH CONSENT FOR TELE-HEALTH VISIT   I hereby voluntarily request, consent and authorize [facility] and its employed or contracted physicians, physician assistants, nurse practitioners or other licensed health care professionals (the Practitioner), to provide me with telemedicine health care services (the "Services") as deemed necessary by the treating Practitioner. I acknowledge and consent to receive the Services by the Practitioner via telemedicine. I understand that the telemedicine visit will involve communicating with the Practitioner through live audiovisual communication technology and the disclosure of certain medical information by electronic transmission. I acknowledge that I have been given the opportunity to request an in-person assessment or other available alternative prior to the telemedicine visit and am voluntarily participating in the telemedicine visit.  I understand that I have the right to withhold or withdraw my consent to the use of telemedicine in the course of my care at any time, without affecting my right to future care  or treatment, and that the Practitioner or I may terminate the telemedicine visit at any time. I understand that I have the right to inspect all information obtained and/or recorded in the course of the telemedicine visit and may receive copies of available information for a reasonable fee.  I understand that some of the potential risks of receiving the Services via telemedicine include:  Marland Kitchen Delay or interruption in medical evaluation due to technological equipment failure or disruption; . Information transmitted may not be sufficient (e.g. poor resolution of images) to allow for appropriate medical decision making by the Practitioner; and/or  . In rare instances, security protocols could fail, causing a breach of personal health information.  Furthermore, I acknowledge that it is my responsibility to provide information about my medical history, conditions and care that is complete and accurate to the best of my ability. I acknowledge that Practitioner's advice, recommendations, and/or decision may be based on factors not within their control, such as incomplete or inaccurate data provided by me or distortions of diagnostic images or specimens that may result from electronic transmissions.  I understand that the practice of medicine is not an exact science and that Practitioner makes no warranties or guarantees regarding treatment outcomes. I acknowledge that I will receive a copy of this consent concurrently upon execution via email to the email address I last provided but may also request a printed copy by calling the office of [facility].    I understand that my insurance will be billed for this visit.   I have read or had this consent read to me. . I understand the contents of this consent, which adequately explains the benefits and risks of the Services being provided via telemedicine.  . I have been provided ample opportunity to ask questions regarding this consent and the Services and have had  my questions answered to my satisfaction. . I give my informed consent for the services to be provided through the use of telemedicine in my medical care  By participating in this telemedicine visit I agree to the above.

## 2019-05-03 ENCOUNTER — Encounter (HOSPITAL_COMMUNITY): Payer: BLUE CROSS/BLUE SHIELD

## 2019-05-03 ENCOUNTER — Ambulatory Visit (HOSPITAL_COMMUNITY): Admission: RE | Admit: 2019-05-03 | Payer: BC Managed Care – PPO | Source: Ambulatory Visit

## 2019-05-26 ENCOUNTER — Encounter (HOSPITAL_COMMUNITY): Payer: Self-pay

## 2019-05-26 ENCOUNTER — Ambulatory Visit (HOSPITAL_COMMUNITY)
Admission: EM | Admit: 2019-05-26 | Discharge: 2019-05-26 | Disposition: A | Discharge status: Home / Self Care | Payer: BC Managed Care – PPO | Attending: Family Medicine | Admitting: Family Medicine

## 2019-05-26 ENCOUNTER — Other Ambulatory Visit: Payer: Self-pay

## 2019-05-26 DIAGNOSIS — Z20822 Contact with and (suspected) exposure to covid-19: Secondary | ICD-10-CM

## 2019-05-26 DIAGNOSIS — I10 Essential (primary) hypertension: Secondary | ICD-10-CM

## 2019-05-26 DIAGNOSIS — Z20828 Contact with and (suspected) exposure to other viral communicable diseases: Secondary | ICD-10-CM | POA: Diagnosis not present

## 2019-05-26 NOTE — Discharge Instructions (Signed)
We tested you for COVID We will call you if your results are positive Follow up as needed for continued or worsening symptoms

## 2019-05-26 NOTE — ED Triage Notes (Signed)
Pt presents for Covid testing after someone at his job tested positive; patient has no complaints of symptoms.

## 2019-05-26 NOTE — ED Provider Notes (Signed)
[facility]    CSN: [id number] Arrival date & time: 05/26/19  6950     History   Chief Complaint Chief Complaint  Patient presents with  . COVID TESTING    HPI PEIRRE Chase is a 33 y.o. male.   Patient is a 33 year old male with past medical history of hypertension, obesity, CHF, CKD, cardiomyopathy.  He presents today wanting COVID testing.  Reporting a coworker tested positive.  He has been working with this person.  Reporting that they wear masks at work.  He is currently denying any symptoms.  No fevers, chest pain or shortness of breath.  ROS per HPI      Past Medical History:  Diagnosis Date  . Hypertension   . Obesity   . OSA (obstructive sleep apnea) 11/05/2014   02/2015 -severe -AHI 106/h     Patient Active Problem List   Diagnosis Date Noted  . Lipodystrophy 10/21/2018  . Obesity (BMI 30-39.9) 10/06/2018  . Chronic systolic heart failure (HCC) 02/17/2018  . Fatigue 02/17/2018  . Cardiomyopathy (HCC) 02/11/2018  . Hypertensive crisis 02/04/2018  . Chest pain 02/04/2018  . CKD (chronic kidney disease), stage II 02/04/2018  . Allergic rhinitis 01/21/2018  . Hyperglycemia 12/02/2017  . Hypokalemia 12/02/2017  . Cough 12/02/2017  . Patellar tendonitis of left knee 12/02/2017  . Preventative health care 12/10/2016  . Obesity 05/25/2016  . Lower leg edema 12/12/2014  . Essential hypertension 11/05/2014  . OSA (obstructive sleep apnea) 11/05/2014    Past Surgical History:  Procedure Laterality Date  . HERNIA REPAIR    . RIGHT/LEFT HEART CATH AND CORONARY ANGIOGRAPHY N/A 02/07/2018   Procedure: RIGHT/LEFT HEART CATH AND CORONARY ANGIOGRAPHY;  Surgeon: Marykay Lex, MD;  Location: [facility] INVASIVE CV LAB;  Service: Cardiovascular;  Laterality: N/A;       Home Medications    Prior to Admission medications   Medication Sig Start Date End Date Taking? Authorizing Provider  carvedilol (COREG) 25 MG tablet Take 1 tablet (25 mg total) by mouth  2 (two) times daily with a meal. 12/19/18   Alford Highland, NP  digoxin (LANOXIN) 0.125 MG tablet Take 1 tablet (125 mcg total) by mouth daily. 07/12/18   Graciella Freer, PA-C  furosemide (LASIX) 20 MG tablet Take 1 tablet (20 mg total) by mouth daily. 11/28/18 03/20/19  Clegg, Amy D, NP  isosorbide-hydrALAZINE (BIDIL) 20-37.5 MG tablet Take 2 tablets by mouth 3 (three) times daily. 04/28/18   Bensimhon, Bevelyn Buckles, MD  potassium chloride SA (K-DUR,KLOR-CON) 20 MEQ tablet Take 3 tablets (60 mEq total) by mouth daily. 12/19/18   Alford Highland, NP  sacubitril-valsartan (ENTRESTO) 97-103 MG Take 1 tablet by mouth 2 (two) times daily. 09/26/18   Alford Highland, NP  spironolactone (ALDACTONE) 25 MG tablet Take 1 tablet (25 mg total) by mouth daily. 07/12/18   Graciella Freer, PA-C    Family History Family History  Problem Relation Age of Onset  . Hypertension Mother   . Heart disease Maternal Grandfather   . Hypertension Maternal Grandfather   . Hypertension Sister     Social History Social History   Tobacco Use  . Smoking status: Never Smoker  . Smokeless tobacco: Never Used  Substance Use Topics  . Alcohol use: No  . Drug use: No     Allergies   Patient has no known allergies.   Review of Systems Review of Systems   Physical Exam Triage Vital Signs ED Triage Vitals  Enc Vitals Group     BP 05/26/19 0954 (!) 145/90     Pulse Rate 05/26/19 0954 92     Resp 05/26/19 0954 17     Temp 05/26/19 0954 98.3 F (36.8 C)     Temp Source 05/26/19 0954 Oral     SpO2 05/26/19 0954 93 %     Weight --      Height --      Head Circumference --      Peak Flow --      Pain Score 05/26/19 0955 0     Pain Loc --      Pain Edu? --      Excl. in [place]? --    No data found.  Updated Vital Signs BP (!) 145/90 (BP Location: Right Arm)   Pulse 92   Temp 98.3 F (36.8 C) (Oral)   Resp 17   SpO2 93%   Visual Acuity Right Eye Distance:   Left Eye Distance:   Bilateral  Distance:    Right Eye Near:   Left Eye Near:    Bilateral Near:     Physical Exam Vitals signs and nursing note reviewed.  Constitutional:      Appearance: Normal appearance.  HENT:     Head: Normocephalic and atraumatic.     Nose: Nose normal.  Eyes:     Conjunctiva/sclera: Conjunctivae normal.  Neck:     Musculoskeletal: Normal range of motion.  Pulmonary:     Effort: Pulmonary effort is normal.  Musculoskeletal: Normal range of motion.  Skin:    General: Skin is warm and dry.  Neurological:     Mental Status: He is alert.  Psychiatric:        Mood and Affect: Mood normal.      UC Treatments / Results  Labs (all labs ordered are listed, but only abnormal results are displayed) Labs Reviewed  NOVEL CORONAVIRUS, NAA (HOSPITAL ORDER, SEND-OUT TO REF LAB)    EKG   Radiology No results found.  Procedures Procedures (including critical care time)  Medications Ordered in UC Medications - No data to display  Initial Impression / Assessment and Plan / UC Course  I have reviewed the triage vital signs and the nursing notes.  Pertinent labs & imaging results that were available during my care of the patient were reviewed by me and considered in my medical decision making (see chart for details).     Testing for COVID done today based on exposure Work note given Follow up as needed for continued or worsening symptoms  Final Clinical Impressions(s) / UC Diagnoses   Final diagnoses:  Exposure to Covid-19 Virus     Discharge Instructions     We tested you for COVID We will call you if your results are positive Follow up as needed for continued or worsening symptoms     ED Prescriptions    None     Controlled Substance Prescriptions [place] Controlled Substance Registry consulted? Not Applicable   Orvan July, NP 05/26/19 1007

## 2019-05-27 LAB — NOVEL CORONAVIRUS, NAA (HOSP ORDER, SEND-OUT TO REF LAB; TAT 18-24 HRS): SARS-CoV-2, NAA: NOT DETECTED

## 2019-06-15 ENCOUNTER — Ambulatory Visit (HOSPITAL_COMMUNITY)
Admission: EM | Admit: 2019-06-15 | Discharge: 2019-06-15 | Disposition: A | Discharge status: Home / Self Care | Payer: BC Managed Care – PPO | Attending: Family Medicine | Admitting: Family Medicine

## 2019-06-15 ENCOUNTER — Ambulatory Visit (INDEPENDENT_AMBULATORY_CARE_PROVIDER_SITE_OTHER): Payer: BC Managed Care – PPO

## 2019-06-15 ENCOUNTER — Encounter (HOSPITAL_COMMUNITY): Payer: Self-pay

## 2019-06-15 ENCOUNTER — Other Ambulatory Visit: Payer: Self-pay

## 2019-06-15 DIAGNOSIS — M778 Other enthesopathies, not elsewhere classified: Secondary | ICD-10-CM

## 2019-06-15 DIAGNOSIS — M779 Enthesopathy, unspecified: Secondary | ICD-10-CM

## 2019-06-15 MED ORDER — METHYLPREDNISOLONE SODIUM SUCC 125 MG IJ SOLR
80.0000 mg | Freq: Once | INTRAMUSCULAR | Status: AC
  Administered 2019-06-15: 80 mg via INTRAMUSCULAR

## 2019-06-15 MED ORDER — METHYLPREDNISOLONE SODIUM SUCC 125 MG IJ SOLR
INTRAMUSCULAR | Status: AC
  Filled 2019-06-15: qty 2

## 2019-06-15 MED ORDER — PREDNISONE 20 MG PO TABS: ORAL_TABLET | ORAL | 0 refills | Status: DC

## 2019-06-15 NOTE — ED Provider Notes (Signed)
[facility]    CSN: [id number] Arrival date & time: 06/15/19  1411     History   Chief Complaint Chief Complaint  Patient presents with  . Ankle Pain    HPI Steven Chase is a 33 y.o. male.   Patient is a 33 year old male presents today with left ankle/foot pain.  This is been present since waking up this morning.  The pain has been constant.  Pain is worse with walking on the foot.  The pain is located to the dorsum of the foot near the ankle.  There is mild swelling.  No erythema.  Denies any history of gout.  Denies any specific injuries but reports that he may have " twinged" something when he was walking on the stairs at work last night.  He came in today with a cam walker that he borrowed from his girlfriend.  Reports this does not help with his symptoms or with walking.  He has not taken any medication for pain.  ROS per HPI      Past Medical History:  Diagnosis Date  . Hypertension   . Obesity   . OSA (obstructive sleep apnea) 11/05/2014   02/2015 -severe -AHI 106/h     Patient Active Problem List   Diagnosis Date Noted  . Lipodystrophy 10/21/2018  . Obesity (BMI 30-39.9) 10/06/2018  . Chronic systolic heart failure ([place]) 02/17/2018  . Fatigue 02/17/2018  . Cardiomyopathy ([place]) 02/11/2018  . Hypertensive crisis 02/04/2018  . Chest pain 02/04/2018  . CKD (chronic kidney disease), stage II 02/04/2018  . Allergic rhinitis 01/21/2018  . Hyperglycemia 12/02/2017  . Hypokalemia 12/02/2017  . Cough 12/02/2017  . Patellar tendonitis of left knee 12/02/2017  . Preventative health care 12/10/2016  . Obesity 05/25/2016  . Lower leg edema 12/12/2014  . Essential hypertension 11/05/2014  . OSA (obstructive sleep apnea) 11/05/2014    Past Surgical History:  Procedure Laterality Date  . HERNIA REPAIR    . RIGHT/LEFT HEART CATH AND CORONARY ANGIOGRAPHY N/A 02/07/2018   Procedure: RIGHT/LEFT HEART CATH AND CORONARY ANGIOGRAPHY;  Surgeon: Leonie Man,  MD;  Location: [place] CV LAB;  Service: Cardiovascular;  Laterality: N/A;       Home Medications    Prior to Admission medications   Medication Sig Start Date End Date Taking? Authorizing Provider  carvedilol (COREG) 25 MG tablet Take 1 tablet (25 mg total) by mouth 2 (two) times daily with a meal. 12/19/18   Georgiana Shore, NP  digoxin (LANOXIN) 0.125 MG tablet Take 1 tablet (125 mcg total) by mouth daily. 07/12/18   Shirley Friar, PA-C  furosemide (LASIX) 20 MG tablet Take 1 tablet (20 mg total) by mouth daily. 11/28/18 03/20/19  Clegg, Amy D, NP  isosorbide-hydrALAZINE (BIDIL) 20-37.5 MG tablet Take 2 tablets by mouth 3 (three) times daily. 04/28/18   Bensimhon, Shaune Pascal, MD  potassium chloride SA (K-DUR,KLOR-CON) 20 MEQ tablet Take 3 tablets (60 mEq total) by mouth daily. 12/19/18   Georgiana Shore, NP  predniSONE (DELTASONE) 20 MG tablet Take 2 pills a day for 3 days then take 1 pill a day for 3 days 06/15/19   Raylene Everts, MD  sacubitril-valsartan (ENTRESTO) 97-103 MG Take 1 tablet by mouth 2 (two) times daily. 09/26/18   Georgiana Shore, NP  spironolactone (ALDACTONE) 25 MG tablet Take 1 tablet (25 mg total) by mouth daily. 07/12/18   Shirley Friar, PA-C    Family History Family History  Problem Relation Age of Onset  . Hypertension Mother   . Heart disease Maternal Grandfather   . Hypertension Maternal Grandfather   . Hypertension Sister     Social History Social History   Tobacco Use  . Smoking status: Never Smoker  . Smokeless tobacco: Never Used  Substance Use Topics  . Alcohol use: No  . Drug use: No     Allergies   Patient has no known allergies.   Review of Systems Review of Systems   Physical Exam Triage Vital Signs ED Triage Vitals [06/15/19 1453]  Enc Vitals Group     BP      Pulse      Resp      Temp      Temp src      SpO2      Weight 237 lb (107.5 kg)     Height      Head Circumference      Peak Flow      Pain  Score 10     Pain Loc      Pain Edu?      Excl. in [place]?    No data found.  Updated Vital Signs Wt 237 lb (107.5 kg)   BMI 38.25 kg/m   Visual Acuity Right Eye Distance:   Left Eye Distance:   Bilateral Distance:    Right Eye Near:   Left Eye Near:    Bilateral Near:     Physical Exam Vitals signs and nursing note reviewed.  Constitutional:      Appearance: Normal appearance. He is obese.  HENT:     Head: Normocephalic and atraumatic.     Nose: Nose normal.  Eyes:     Conjunctiva/sclera: Conjunctivae normal.  Neck:     Musculoskeletal: Normal range of motion.  Cardiovascular:     Pulses:          Dorsalis pedis pulses are 2+ on the left side.  Pulmonary:     Effort: Pulmonary effort is normal.  Musculoskeletal:       Feet:  Feet:     Comments: TTP with mild swelling. Good ROM of the ankle. No bruising, erythema or deformities. Lateral malleolus appears swollen slightly but nontender to palpation. Skin:    General: Skin is warm.  Neurological:     Mental Status: He is alert.  Psychiatric:        Mood and Affect: Mood normal.      UC Treatments / Results  Labs (all labs ordered are listed, but only abnormal results are displayed) Labs Reviewed - No data to display  EKG   Radiology Dg Foot Complete Left  Result Date: 06/15/2019 CLINICAL DATA:  Pain with inability to bear weight EXAM: LEFT FOOT - COMPLETE 3+ VIEW COMPARISON:  None. FINDINGS: Frontal, oblique, and lateral views obtained. There is no fracture or dislocation. Joint spaces appear normal. No erosive change. IMPRESSION: No fracture or dislocation.  No evident arthropathy. Electronically Signed   By: Bretta BangWilliam  Woodruff III M.D.   On: 06/15/2019 16:20    Procedures Procedures (including critical care time)  Medications Ordered in UC Medications  methylPREDNISolone sodium succinate (SOLU-MEDROL) 125 mg/2 mL injection 80 mg (80 mg Intramuscular Given 06/15/19 1640)  methylPREDNISolone sodium  succinate (SOLU-MEDROL) 125 mg/2 mL injection (has no administration in time range)  methylPREDNISolone sodium succinate (SOLU-MEDROL) 125 mg/2 mL injection (has no administration in time range)    Initial Impression / Assessment and Plan / UC Course  I  have reviewed the triage vital signs and the nursing notes.  Pertinent labs & imaging results that were available during my care of the patient were reviewed by me and considered in my medical decision making (see chart for details).     Tenosynovitis-  X-ray negative.  Treating for tenosynovitis of the left foot. Rest, ice, elevate and steroid injection given in clinic. Steroid taper sent to pharmacy.  Recommended starting this tomorrow.  Reduce walking. Follow-up primary care if not better  Final Clinical Impressions(s) / UC Diagnoses   Final diagnoses:  Extensor tendinitis of foot     Discharge Instructions     Use ice and elevation to reduce the swelling in your foot Reduce your walking Take the prednisone as directed.  Start tomorrow Make sure that you are very careful eating salt while you are on prednisone.  I do want you to have fluid retention You should see improvement in the next few days Call your primary care doctor, Dr. Jonny Ruiz, if you are not better by Monday    ED Prescriptions    Medication Sig Dispense Auth. Provider   predniSONE (DELTASONE) 20 MG tablet Take 2 pills a day for 3 days then take 1 pill a day for 3 days 9 tablet Eustace Moore, MD     Controlled Substance Prescriptions [place] Controlled Substance Registry consulted? Not Applicable   Janace Aris, NP 06/16/19 [id number]

## 2019-06-15 NOTE — ED Provider Notes (Signed)
Patient initially seen by Loura Halt, NP. I reviewed the x-ray.  It was negative I discussed results with Steven Chase.  He has tenderness along the dorsum of his left foot with warmth and mild swelling.  Pain with movement of the toes.  This is consistent with a tenosynovitis of the foot, extensor. Discussed rest, ice, anti-inflammatory medications Off work until Monday Follow-up with PCP if fails to improve    Steven Everts, MD 06/15/19 1658

## 2019-06-15 NOTE — ED Triage Notes (Signed)
Pt cc he woke up with pain in his left ankle. Pt states he has not had a injury.

## 2019-06-15 NOTE — Discharge Instructions (Signed)
Use ice and elevation to reduce the swelling in your foot Reduce your walking Take the prednisone as directed.  Start tomorrow Make sure that you are very careful eating salt while you are on prednisone.  I do want you to have fluid retention You should see improvement in the next few days Call your primary care doctor, Dr. Jenny Reichmann, if you are not better by Monday

## 2019-07-15 ENCOUNTER — Encounter (HOSPITAL_COMMUNITY): Payer: Self-pay | Admitting: Emergency Medicine

## 2019-07-15 ENCOUNTER — Inpatient Hospital Stay (HOSPITAL_COMMUNITY)
Admission: EM | Admit: 2019-07-15 | Discharge: 2019-07-19 | DRG: 291 | Disposition: A | Discharge status: Home / Self Care | Payer: Self-pay | Attending: Internal Medicine | Admitting: Internal Medicine

## 2019-07-15 ENCOUNTER — Ambulatory Visit (HOSPITAL_COMMUNITY)
Admission: EM | Admit: 2019-07-15 | Discharge: 2019-07-15 | Disposition: A | Discharge status: Home / Self Care | Payer: BC Managed Care – PPO

## 2019-07-15 ENCOUNTER — Encounter (HOSPITAL_COMMUNITY): Payer: Self-pay

## 2019-07-15 ENCOUNTER — Other Ambulatory Visit: Payer: Self-pay

## 2019-07-15 ENCOUNTER — Emergency Department (HOSPITAL_COMMUNITY): Payer: Self-pay

## 2019-07-15 DIAGNOSIS — I169 Hypertensive crisis, unspecified: Secondary | ICD-10-CM | POA: Diagnosis present

## 2019-07-15 DIAGNOSIS — I472 Ventricular tachycardia: Secondary | ICD-10-CM | POA: Diagnosis present

## 2019-07-15 DIAGNOSIS — Z9111 Patient's noncompliance with dietary regimen: Secondary | ICD-10-CM

## 2019-07-15 DIAGNOSIS — Z9119 Patient's noncompliance with other medical treatment and regimen: Secondary | ICD-10-CM

## 2019-07-15 DIAGNOSIS — Z8249 Family history of ischemic heart disease and other diseases of the circulatory system: Secondary | ICD-10-CM

## 2019-07-15 DIAGNOSIS — Z23 Encounter for immunization: Secondary | ICD-10-CM

## 2019-07-15 DIAGNOSIS — N183 Chronic kidney disease, stage 3 (moderate): Secondary | ICD-10-CM | POA: Diagnosis present

## 2019-07-15 DIAGNOSIS — R079 Chest pain, unspecified: Secondary | ICD-10-CM

## 2019-07-15 DIAGNOSIS — Z20828 Contact with and (suspected) exposure to other viral communicable diseases: Secondary | ICD-10-CM | POA: Diagnosis present

## 2019-07-15 DIAGNOSIS — I420 Dilated cardiomyopathy: Secondary | ICD-10-CM | POA: Diagnosis present

## 2019-07-15 DIAGNOSIS — I509 Heart failure, unspecified: Secondary | ICD-10-CM

## 2019-07-15 DIAGNOSIS — Z7952 Long term (current) use of systemic steroids: Secondary | ICD-10-CM

## 2019-07-15 DIAGNOSIS — I130 Hypertensive heart and chronic kidney disease with heart failure and stage 1 through stage 4 chronic kidney disease, or unspecified chronic kidney disease: Principal | ICD-10-CM | POA: Diagnosis present

## 2019-07-15 DIAGNOSIS — Z79899 Other long term (current) drug therapy: Secondary | ICD-10-CM

## 2019-07-15 DIAGNOSIS — N179 Acute kidney failure, unspecified: Secondary | ICD-10-CM | POA: Diagnosis present

## 2019-07-15 DIAGNOSIS — Z6841 Body Mass Index (BMI) 40.0 and over, adult: Secondary | ICD-10-CM

## 2019-07-15 DIAGNOSIS — I5023 Acute on chronic systolic (congestive) heart failure: Secondary | ICD-10-CM | POA: Diagnosis present

## 2019-07-15 DIAGNOSIS — Z9114 Patient's other noncompliance with medication regimen: Secondary | ICD-10-CM

## 2019-07-15 DIAGNOSIS — E876 Hypokalemia: Secondary | ICD-10-CM | POA: Diagnosis present

## 2019-07-15 DIAGNOSIS — G4733 Obstructive sleep apnea (adult) (pediatric): Secondary | ICD-10-CM | POA: Diagnosis present

## 2019-07-15 DIAGNOSIS — I447 Left bundle-branch block, unspecified: Secondary | ICD-10-CM | POA: Diagnosis present

## 2019-07-15 LAB — DIGOXIN LEVEL: Digoxin Level: <0.2 ng/mL — ABNORMAL LOW (ref 0.8–2.0)

## 2019-07-15 LAB — URINALYSIS, ROUTINE W REFLEX MICROSCOPIC
Bacteria, UA: NONE SEEN
Bilirubin Urine: NEGATIVE
Glucose, UA: NEGATIVE mg/dL
Ketones, ur: NEGATIVE mg/dL
Leukocytes,Ua: NEGATIVE
Nitrite: NEGATIVE
Protein, ur: >=300 mg/dL — AB
Specific Gravity, Urine: 1.03 (ref 1.005–1.030)
pH: 5 (ref 5.0–8.0)

## 2019-07-15 LAB — COMPREHENSIVE METABOLIC PANEL
ALT: 22 U/L (ref 0–44)
AST: 28 U/L (ref 15–41)
Albumin: 3.4 g/dL — ABNORMAL LOW (ref 3.5–5.0)
Alkaline Phosphatase: 37 U/L — ABNORMAL LOW (ref 38–126)
Anion gap: 10 (ref 5–15)
BUN: 29 mg/dL — ABNORMAL HIGH (ref 6–20)
CO2: 28 mmol/L (ref 22–32)
Calcium: 8.7 mg/dL — ABNORMAL LOW (ref 8.9–10.3)
Chloride: 101 mmol/L (ref 98–111)
Creatinine, Ser: 1.59 mg/dL — ABNORMAL HIGH (ref 0.61–1.24)
GFR calc Af Amer: >60 mL/min (ref >60)
GFR calc non Af Amer: 56 mL/min — ABNORMAL LOW (ref >60)
Glucose, Bld: 109 mg/dL — ABNORMAL HIGH (ref 70–99)
Potassium: 3.2 mmol/L — ABNORMAL LOW (ref 3.5–5.1)
Sodium: 139 mmol/L (ref 135–145)
Total Bilirubin: 1.3 mg/dL — ABNORMAL HIGH (ref 0.3–1.2)
Total Protein: 6.1 g/dL — ABNORMAL LOW (ref 6.5–8.1)

## 2019-07-15 LAB — CBC
HCT: 45.5 % (ref 39.0–52.0)
Hemoglobin: 14.8 g/dL (ref 13.0–17.0)
MCH: 27 pg (ref 26.0–34.0)
MCHC: 32.5 g/dL (ref 30.0–36.0)
MCV: 82.9 fL (ref 80.0–100.0)
Platelets: 256 10*3/uL (ref 150–400)
RBC: 5.49 MIL/uL (ref 4.22–5.81)
RDW: 14 % (ref 11.5–15.5)
WBC: 8 10*3/uL (ref 4.0–10.5)
nRBC: 0 % (ref 0.0–0.2)

## 2019-07-15 LAB — LIPASE, BLOOD: Lipase: 32 U/L (ref 11–51)

## 2019-07-15 LAB — BRAIN NATRIURETIC PEPTIDE: B Natriuretic Peptide: 1024.4 pg/mL — ABNORMAL HIGH (ref 0.0–100.0)

## 2019-07-15 LAB — TROPONIN I (HIGH SENSITIVITY): Troponin I (High Sensitivity): 111 ng/L (ref <18)

## 2019-07-15 MED ORDER — ACETAMINOPHEN 325 MG PO TABS
650.0000 mg | ORAL_TABLET | ORAL | Status: DC | PRN
  Administered 2019-07-16: 650 mg via ORAL
  Filled 2019-07-15: qty 2

## 2019-07-15 MED ORDER — ENOXAPARIN SODIUM 40 MG/0.4ML ~~LOC~~ SOLN
40.0000 mg | Freq: Every day | SUBCUTANEOUS | Status: DC
  Administered 2019-07-16 – 2019-07-19 (×5): 40 mg via SUBCUTANEOUS
  Filled 2019-07-15 (×4): qty 0.4

## 2019-07-15 MED ORDER — SODIUM CHLORIDE 0.9% FLUSH: 3.0000 mL | INTRAVENOUS | Status: DC | PRN

## 2019-07-15 MED ORDER — CARVEDILOL 25 MG PO TABS
25.0000 mg | ORAL_TABLET | Freq: Two times a day (BID) | ORAL | Status: DC
  Administered 2019-07-16 – 2019-07-19 (×7): 25 mg via ORAL
  Filled 2019-07-15: qty 1
  Filled 2019-07-15: qty 2
  Filled 2019-07-15 (×5): qty 1

## 2019-07-15 MED ORDER — ISOSORB DINITRATE-HYDRALAZINE 20-37.5 MG PO TABS
2.0000 | ORAL_TABLET | Freq: Three times a day (TID) | ORAL | Status: DC
  Administered 2019-07-16 – 2019-07-18 (×10): 2 via ORAL
  Filled 2019-07-15 (×13): qty 2

## 2019-07-15 MED ORDER — POTASSIUM CHLORIDE CRYS ER 20 MEQ PO TBCR
40.0000 meq | EXTENDED_RELEASE_TABLET | Freq: Once | ORAL | Status: AC
  Administered 2019-07-15: 40 meq via ORAL
  Filled 2019-07-15: qty 2

## 2019-07-15 MED ORDER — ALBUTEROL SULFATE (2.5 MG/3ML) 0.083% IN NEBU: 2.5000 mg | INHALATION_SOLUTION | Freq: Four times a day (QID) | RESPIRATORY_TRACT | Status: DC | PRN

## 2019-07-15 MED ORDER — POTASSIUM CHLORIDE 10 MEQ/100ML IV SOLN
10.0000 meq | INTRAVENOUS | Status: AC
  Administered 2019-07-15 – 2019-07-16 (×4): 10 meq via INTRAVENOUS
  Filled 2019-07-15 (×4): qty 100

## 2019-07-15 MED ORDER — SODIUM CHLORIDE 0.9% FLUSH
3.0000 mL | Freq: Two times a day (BID) | INTRAVENOUS | Status: DC
  Administered 2019-07-16 – 2019-07-19 (×7): 3 mL via INTRAVENOUS

## 2019-07-15 MED ORDER — SACUBITRIL-VALSARTAN 97-103 MG PO TABS
1.0000 | ORAL_TABLET | Freq: Two times a day (BID) | ORAL | Status: DC
  Administered 2019-07-16 – 2019-07-19 (×8): 1 via ORAL
  Filled 2019-07-15 (×11): qty 1

## 2019-07-15 MED ORDER — SODIUM CHLORIDE 0.9 % IV SOLN
250.0000 mL | INTRAVENOUS | Status: DC | PRN
  Administered 2019-07-16: 250 mL via INTRAVENOUS

## 2019-07-15 MED ORDER — FUROSEMIDE 10 MG/ML IJ SOLN
80.0000 mg | Freq: Once | INTRAMUSCULAR | Status: AC
  Administered 2019-07-16: 80 mg via INTRAVENOUS
  Filled 2019-07-15: qty 8

## 2019-07-15 NOTE — ED Triage Notes (Signed)
Patient c/o mid epigastric pain onset of yesterday pain. Describes pain as aching. Non radiating. Denies any N/V/D or urinary symptoms. Reports some mild respiratory issues.

## 2019-07-15 NOTE — ED Notes (Signed)
Pt desating while asleep. Said he has hx of sleep apnea. Placed on 2L [place]

## 2019-07-15 NOTE — ED Provider Notes (Signed)
[facility] EMERGENCY DEPARTMENT Provider Note   CSN: [id number] Arrival date & time: 07/15/19  1710     History   Chief Complaint Chief Complaint  Patient presents with  . Abdominal Pain    Mid epigastric     HPI Steven Chase is a 33 y.o. male.     HPI Patient presents with mid epigastric abdominal pain.  Began yesterday.  Dull.  States he has not had much appetite.  No fevers or chills.  No cough.  Slight shortness of breath.  History of systolic heart failure.  States he has not seen the heart failure clinic in a while but is still been taking his medicines.  States his legs are swollen and potentially more swollen than normal.  No sick contacts.  Seen in urgent care and sent here for high blood pressure and EKG changes. Past Medical History:  Diagnosis Date  . Hypertension   . Obesity   . OSA (obstructive sleep apnea) 11/05/2014   02/2015 -severe -AHI 106/h     Patient Active Problem List   Diagnosis Date Noted  . Lipodystrophy 10/21/2018  . Obesity (BMI 30-39.9) 10/06/2018  . Chronic systolic heart failure (HCC) 02/17/2018  . Fatigue 02/17/2018  . Cardiomyopathy (HCC) 02/11/2018  . Hypertensive crisis 02/04/2018  . Chest pain 02/04/2018  . CKD (chronic kidney disease), stage II 02/04/2018  . Allergic rhinitis 01/21/2018  . Hyperglycemia 12/02/2017  . Hypokalemia 12/02/2017  . Cough 12/02/2017  . Patellar tendonitis of left knee 12/02/2017  . Preventative health care 12/10/2016  . Obesity 05/25/2016  . Lower leg edema 12/12/2014  . Essential hypertension 11/05/2014  . OSA (obstructive sleep apnea) 11/05/2014    Past Surgical History:  Procedure Laterality Date  . HERNIA REPAIR    . RIGHT/LEFT HEART CATH AND CORONARY ANGIOGRAPHY N/A 02/07/2018   Procedure: RIGHT/LEFT HEART CATH AND CORONARY ANGIOGRAPHY;  Surgeon: Marykay LexHarding, David W, MD;  [facility];  Service: Cardiovascular;  Laterality: N/A;        Home Medications    Prior to Admission medications   Medication Sig Start Date End Date Taking? Authorizing Provider  carvedilol (COREG) 25 MG tablet Take 1 tablet (25 mg total) by mouth 2 (two) times daily with a meal. 12/19/18  Yes Alford HighlandSmith, Ashley M, NP  digoxin (LANOXIN) 0.125 MG tablet Take 1 tablet (125 mcg total) by mouth daily. 07/12/18  Yes Graciella Freerillery, Michael Andrew, PA-C  isosorbide-hydrALAZINE (BIDIL) 20-37.5 MG tablet Take 2 tablets by mouth 3 (three) times daily. 04/28/18  Yes Bensimhon, Bevelyn Bucklesaniel R, MD  potassium chloride SA (K-DUR,KLOR-CON) 20 MEQ tablet Take 3 tablets (60 mEq total) by mouth daily. 12/19/18  Yes Alford HighlandSmith, Ashley M, NP  predniSONE (DELTASONE) 20 MG tablet Take 2 pills a day for 3 days then take 1 pill a day for 3 days Patient taking differently: Take 20 mg by mouth daily.  06/15/19  Yes Eustace MooreNelson, Yvonne Sue, MD  sacubitril-valsartan (ENTRESTO) 97-103 MG Take 1 tablet by mouth 2 (two) times daily. 09/26/18  Yes Alford HighlandSmith, Ashley M, NP  spironolactone (ALDACTONE) 25 MG tablet Take 1 tablet (25 mg total) by mouth daily. 07/12/18  Yes Graciella Freerillery, Michael Andrew, PA-C    Family History Family History  Problem Relation Age of Onset  . Hypertension Mother   . Heart disease Maternal Grandfather   . Hypertension Maternal Grandfather   . Hypertension Sister     Social History Social History   Tobacco Use  . Smoking status:  Never Smoker  . Smokeless tobacco: Never Used  Substance Use Topics  . Alcohol use: No  . Drug use: No     Allergies   Patient has no known allergies.   Review of Systems Review of Systems  Constitutional: Negative for appetite change.  HENT: Negative for congestion.   Respiratory: Positive for shortness of breath.   Cardiovascular: Positive for leg swelling.  Gastrointestinal: Positive for abdominal pain. Negative for diarrhea, nausea and vomiting.  Genitourinary: Negative for flank pain.  Musculoskeletal: Negative for back pain.  Skin: Negative for rash.  Neurological:  Negative for weakness.     Physical Exam Updated Vital Signs BP (!) 163/120   Pulse 97   Temp 98.7 F (37.1 C) (Oral)   Resp (!) 30   Ht 5\' 6"  (1.676 m)   Wt 99.8 kg   SpO2 96%   BMI 35.51 kg/m   Physical Exam Vitals signs and nursing note reviewed.  HENT:     Head: Normocephalic.  Cardiovascular:     Rate and Rhythm: Regular rhythm.  Pulmonary:     Breath sounds: No wheezing, rhonchi or rales.     Comments: Pitting edema bilateral lower extremities. Abdominal:     Hernia: No hernia is present.     Comments: Mild epigastric tenderness.  Mild abdominal distention.  Skin:    General: Skin is warm.  Neurological:     Mental Status: He is alert and oriented to person, place, and time.      ED Treatments / Results  Labs (all labs ordered are listed, but only abnormal results are displayed) Labs Reviewed  COMPREHENSIVE METABOLIC PANEL - Abnormal; Notable for the following components:      Result Value   Potassium 3.2 (*)    Glucose, Bld 109 (*)    BUN 29 (*)    Creatinine, Ser 1.59 (*)    Calcium 8.7 (*)    Total Protein 6.1 (*)    Albumin 3.4 (*)    Alkaline Phosphatase 37 (*)    Total Bilirubin 1.3 (*)    GFR calc non Af Amer 56 (*)    All other components within normal limits  URINALYSIS, ROUTINE W REFLEX MICROSCOPIC - Abnormal; Notable for the following components:   Hgb urine dipstick SMALL (*)    Protein, ur >=300 (*)    All other components within normal limits  BRAIN NATRIURETIC PEPTIDE - Abnormal; Notable for the following components:   B Natriuretic Peptide 1,024.4 (*)    All other components within normal limits  DIGOXIN LEVEL - Abnormal; Notable for the following components:   Digoxin Level <0.2 (*)    All other components within normal limits  TROPONIN I (HIGH SENSITIVITY) - Abnormal; Notable for the following components:   Troponin I (High Sensitivity) 111 (*)    All other components within normal limits  SARS CORONAVIRUS 2 (HOSPITAL ORDER,  PERFORMED IN [facility] LAB)  LIPASE, BLOOD  CBC    EKG EKG Interpretation  Date/Time:  Saturday July 15 2019 17:21:31 EDT Ventricular Rate:  87 PR Interval:  174 QRS Duration: 106 QT Interval:  414 QTC Calculation: 498 R Axis:   72 Text Interpretation:  Normal sinus rhythm Possible Left atrial enlargement Incomplete left bundle branch block Left ventricular hypertrophy with repolarization abnormality Prolonged QT Abnormal ECG Confirmed by Benjiman Core [phone]) on 07/15/2019 6:05:13 PM   Radiology Dg Chest Portable 1 View  Result Date: 07/15/2019 CLINICAL DATA:  Epigastric pain EXAM: PORTABLE CHEST  1 VIEW COMPARISON:  02/04/2018 FINDINGS: Cardiomegaly with vascular congestion. Mild hazy and interstitial perihilar opacity. No pleural effusion. No focal consolidation or pneumothorax. IMPRESSION: Cardiomegaly with vascular congestion and mild hazy and interstitial perihilar opacities suspect for minimal edema Electronically Signed   By: Donavan Foil M.D.   On: 07/15/2019 18:41    Procedures Procedures (including critical care time)  Medications Ordered in ED Medications - No data to display   Initial Impression / Assessment and Plan / ED Course  I have reviewed the triage vital signs and the nursing notes.  Pertinent labs & imaging results that were available during my care of the patient were reviewed by me and considered in my medical decision making (see chart for details).        Patient resents with upper abdominal pain.  Swelling in his legs.  History of CHF and has an ejection fraction around 15%.  Lab work reassuring for abdominal pathology but I think this may be related to CHF.  BNP is elevated.  High-sensitivity troponin is 110 but unknown baseline.  Blood pressures been elevated.  Patient claims compliance with medication but also his digoxin level is undetectable.  With worsening kidney function.  Feels the patient would benefit from more urgent  cardiology evaluation. will have patient seen in the ER and possible admission. Final Clinical Impressions(s) / ED Diagnoses   Final diagnoses:  Acute on chronic congestive heart failure, unspecified heart failure type [facility])    ED Discharge Orders    None       Davonna Belling, MD 07/15/19 2113

## 2019-07-15 NOTE — ED Notes (Signed)
Report given to 3E RN. All questions answered 

## 2019-07-15 NOTE — ED Provider Notes (Signed)
[facility]    CSN: [id number] Arrival date & time: 07/15/19  1534      History   Chief Complaint No chief complaint on file.   HPI CASTIEL LAURICELLA is a 33 y.o. male.   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  This pain began last night and he rates it as 6/10.  He has had no nausea but he has had some shortness of breath.  He has had no history of heart disease.  Patient denies recreational drug use.     Past Medical History:  Diagnosis Date  . Hypertension   . Obesity   . OSA (obstructive sleep apnea) 11/05/2014   02/2015 -severe -AHI 106/h     Patient Active Problem List   Diagnosis Date Noted  . Lipodystrophy 10/21/2018  . Obesity (BMI 30-39.9) 10/06/2018  . Chronic systolic heart failure ([place]) 02/17/2018  . Fatigue 02/17/2018  . Cardiomyopathy ([place]) 02/11/2018  . Hypertensive crisis 02/04/2018  . Chest pain 02/04/2018  . CKD (chronic kidney disease), stage II 02/04/2018  . Allergic rhinitis 01/21/2018  . Hyperglycemia 12/02/2017  . Hypokalemia 12/02/2017  . Cough 12/02/2017  . Patellar tendonitis of left knee 12/02/2017  . Preventative health care 12/10/2016  . Obesity 05/25/2016  . Lower leg edema 12/12/2014  . Essential hypertension 11/05/2014  . OSA (obstructive sleep apnea) 11/05/2014    Past Surgical History:  Procedure Laterality Date  . HERNIA REPAIR    . RIGHT/LEFT HEART CATH AND CORONARY ANGIOGRAPHY N/A 02/07/2018   Procedure: RIGHT/LEFT HEART CATH AND CORONARY ANGIOGRAPHY;  Surgeon: Leonie Man, MD;  Location: [facility];  Service: Cardiovascular;  Laterality: N/A;       Home Medications    Prior to Admission medications   Medication Sig Start Date End Date Taking? Authorizing Provider  carvedilol (COREG) 25 MG tablet Take 1 tablet (25 mg total) by mouth 2 (two) times daily with a meal. 12/19/18   Georgiana Shore, NP  digoxin (LANOXIN) 0.125 MG tablet Take 1 tablet  (125 mcg total) by mouth daily. 07/12/18   Shirley Friar, PA-C  furosemide (LASIX) 20 MG tablet Take 1 tablet (20 mg total) by mouth daily. 11/28/18 03/20/19  Clegg, Amy D, NP  isosorbide-hydrALAZINE (BIDIL) 20-37.5 MG tablet Take 2 tablets by mouth 3 (three) times daily. 04/28/18   Bensimhon, Shaune Pascal, MD  potassium chloride SA (K-DUR,KLOR-CON) 20 MEQ tablet Take 3 tablets (60 mEq total) by mouth daily. 12/19/18   Georgiana Shore, NP  predniSONE (DELTASONE) 20 MG tablet Take 2 pills a day for 3 days then take 1 pill a day for 3 days 06/15/19   Raylene Everts, MD  sacubitril-valsartan (ENTRESTO) 97-103 MG Take 1 tablet by mouth 2 (two) times daily. 09/26/18   Georgiana Shore, NP  spironolactone (ALDACTONE) 25 MG tablet Take 1 tablet (25 mg total) by mouth daily. 07/12/18   Shirley Friar, PA-C    Family History Family History  Problem Relation Age of Onset  . Hypertension Mother   . Heart disease Maternal Grandfather   . Hypertension Maternal Grandfather   . Hypertension Sister     Social History Social History   Tobacco Use  . Smoking status: Never Smoker  . Smokeless tobacco: Never Used  Substance Use Topics  . Alcohol use: No  . Drug use: No     Allergies   Patient has no known allergies.  Review of Systems Review of Systems  Respiratory: Positive for shortness of breath.   Cardiovascular: Positive for chest pain.  Gastrointestinal: Positive for abdominal pain. Negative for nausea.  All other systems reviewed and are negative.    Physical Exam Triage Vital Signs ED Triage Vitals  Enc Vitals Group     BP 07/15/19 1651 (!) 158/112     Pulse Rate 07/15/19 1651 90     Resp 07/15/19 1651 (!) 22     Temp 07/15/19 1651 98.5 F (36.9 C)     Temp Source 07/15/19 1651 Oral     SpO2 07/15/19 1651 92 %     Weight --      Height --      Head Circumference --      Peak Flow --      Pain Score 07/15/19 1700 6     Pain Loc --      Pain Edu? --       Excl. in GC? --    No data found.  Updated Vital Signs BP (!) 158/112 (BP Location: Right Arm)   Pulse 90   Temp 98.5 F (36.9 C) (Oral)   Resp (!) 22   SpO2 92%    Physical Exam Vitals signs and nursing note reviewed.  Constitutional:      General: He is in acute distress.     Appearance: Normal appearance. He is obese.  HENT:     Head: Normocephalic.     Right Ear: External ear normal.     Left Ear: External ear normal.     Mouth/Throat:     Pharynx: Oropharynx is clear.  Eyes:     Conjunctiva/sclera: Conjunctivae normal.  Neck:     Musculoskeletal: Normal range of motion and neck supple.  Cardiovascular:     Rate and Rhythm: Regular rhythm. Tachycardia present.     Pulses: Normal pulses.     Heart sounds: Normal heart sounds.  Pulmonary:     Effort: Pulmonary effort is normal.     Breath sounds: Normal breath sounds.  Abdominal:     Palpations: Abdomen is soft.     Tenderness: There is no abdominal tenderness.  Musculoskeletal: Normal range of motion.  Skin:    General: Skin is warm and dry.  Neurological:     General: No focal deficit present.     Mental Status: He is alert and oriented to person, place, and time.  Psychiatric:        Mood and Affect: Mood normal.        Behavior: Behavior normal.      UC Treatments / Results  Labs (all labs ordered are listed, but only abnormal results are displayed) Labs Reviewed - No data to display  EKG Partial left bundle on 12-lead EKG.  Radiology No results found.  Procedures Procedures (including critical care time)  Medications Ordered in UC Medications - No data to display  Initial Impression / Assessment and Plan / UC Course  I have reviewed the triage vital signs and the nursing notes.  Pertinent labs & imaging results that were available during my care of the patient were reviewed by me and considered in my medical decision making (see chart for details).    Final Clinical Impressions(s) / UC  Diagnoses   Final diagnoses:  Chest pain, unspecified type     Discharge Instructions     Cannot diagnose your condition because your cardiogram shows left bundle branch block.  We are concerned  about your heart rate and the kind of pain you are having.  Therefore sitting down the emergency room for further testing.    ED Prescriptions    None     Controlled Substance Prescriptions [place] Controlled Substance Registry consulted? Not Applicable   Elvina Sidle, MD 07/15/19 [phone]

## 2019-07-15 NOTE — ED Notes (Signed)
ED Provider at bedside. 

## 2019-07-15 NOTE — Discharge Instructions (Signed)
Cannot diagnose your condition because your cardiogram shows left bundle branch block.  We are concerned about your heart rate and the kind of pain you are having.  Therefore sitting down the emergency room for further testing.

## 2019-07-15 NOTE — H&P (Signed)
CARDIOLOGY ADMISSION NOTE  Patient ID: Steven Chase MRN: [id number] DOB/AGE: November 29, 1985 33 y.o.  Admit date: 07/15/2019 Primary Cardiologist   Kristeen MissPhilip Nahser, MD Chief Complaint: Chest Pain   HPI:  Steven Chase is a 33 y.o. male with chronic NICM (EF 15-20% in 07/2018), poorly controlled HTN, OSA, and obesity who presents with 1 day of chest pain.   Patient states that he first noted chest pain the evening of 9/11.  Reports that pain was in the lower part of his midsternal chest/epigastrium without radiation.  He rated the pain a 3 out of 10 in severity, at its worse.  Denies any clear precipitating factors but states that the pain may have gotten worse with moving around and actually improved with lying still in bed.  While he denies any recent sick contacts or fever/chills, patient does endorse a dry nonproductive cough that has been present for 5 days.  He also reports some mild shortness of breath with exertion over the same time span.  Denies any other associated symptoms including: palpitations, PND/orthopnea, nausea/vomiting, diarrhea/constipation, headaches, or lightheadedness/dizziness.  He reports that he has been adherent with most of his medications, except for 2 that he has not taken in some time (he is not sure which medications these are).  He does not routinely check his blood pressures at home or weigh himself.  The last time that he was seen in heart failure clinic was back in February 2020.  His weight today in the ED is 220 lbs (last recorded weight 1 month ago was 237 lbs; and over the past year he has been in the 242 to 250 lbs range.)  [facility], patient was afebrile with initial vital signs were BP 158/112, heart rate 90, satting 92% on room air.  CMP was notable for K of 3.2, elevated BUN and creatinine (creatinine 1.59 from baseline of approximately 1.2).  CBC was within normal limits.  BNP was elevated at 1024.4.  Digoxin level was < 0.2 initial troponin was elevated  at 111.  EKG showed normal sinus rhythm with LVH and incomplete left bundle branch block.  Chest x-ray was concerning for cardiomegaly with vascular congestion and minimal pulmonary edema.  UA was negative for nitrites and leukocytes, but did show significant proteinuria. At the time of my interview, patient was chest pain-free and asleep in bed, but aroused easily to voice and was alert and oriented.      Past Medical History:  Diagnosis Date  . Hypertension   . Obesity   . OSA (obstructive sleep apnea) 11/05/2014   02/2015 -severe -AHI 106/h     Past Surgical History:  Procedure Laterality Date  . HERNIA REPAIR    . RIGHT/LEFT HEART CATH AND CORONARY ANGIOGRAPHY N/A 02/07/2018   Procedure: RIGHT/LEFT HEART CATH AND CORONARY ANGIOGRAPHY;  Surgeon: Marykay LexHarding, David W, MD;  [facility];  Service: Cardiovascular;  Laterality: N/A;    No Known Allergies No current facility-administered medications on file prior to encounter.    Current Outpatient Medications on File Prior to Encounter  Medication Sig Dispense Refill  . carvedilol (COREG) 25 MG tablet Take 1 tablet (25 mg total) by mouth 2 (two) times daily with a meal. 60 tablet 6  . digoxin (LANOXIN) 0.125 MG tablet Take 1 tablet (125 mcg total) by mouth daily. 30 tablet 3  . isosorbide-hydrALAZINE (BIDIL) 20-37.5 MG tablet Take 2 tablets by mouth 3 (three) times daily. 90 tablet 11  . potassium chloride  SA (K-DUR,KLOR-CON) 20 MEQ tablet Take 3 tablets (60 mEq total) by mouth daily. 60 tablet 3  . predniSONE (DELTASONE) 20 MG tablet Take 2 pills a day for 3 days then take 1 pill a day for 3 days (Patient taking differently: Take 20 mg by mouth daily. ) 9 tablet 0  . sacubitril-valsartan (ENTRESTO) 97-103 MG Take 1 tablet by mouth 2 (two) times daily. 60 tablet 3  . spironolactone (ALDACTONE) 25 MG tablet Take 1 tablet (25 mg total) by mouth daily. 30 tablet 3   Social History   Socioeconomic History  . Marital status:  Single    Spouse name: Not on file  . Number of children: 1  . Years of education: 68  . Highest education level: Not on file  Occupational History  . Occupation: Research scientist (physical sciences): [employer]  Social Needs  . Financial resource strain: Not on file  . Food insecurity    Worry: Not on file    Inability: Not on file  . Transportation needs    Medical: Not on file    Non-medical: Not on file  Tobacco Use  . Smoking status: Never Smoker  . Smokeless tobacco: Never Used  Substance and Sexual Activity  . Alcohol use: No  . Drug use: No  . Sexual activity: Not on file  Lifestyle  . Physical activity    Days per week: Not on file    Minutes per session: Not on file  . Stress: Not on file  Relationships  . Social Herbalist on phone: Not on file    Gets together: Not on file    Attends religious service: Not on file    Active member of club or organization: Not on file    Attends meetings of clubs or organizations: Not on file    Relationship status: Not on file  . Intimate partner violence    Fear of current or ex partner: Not on file    Emotionally abused: Not on file    Physically abused: Not on file    Forced sexual activity: Not on file  Other Topics Concern  . Not on file  Social History Narrative   Born and raised in [place], [place]. Currently live in a private residence with his mother. Fun: Play with his daughter.   Denies religious beliefs that would effect health care.     Family History  Problem Relation Age of Onset  . Hypertension Mother   . Heart disease Maternal Grandfather   . Hypertension Maternal Grandfather   . Hypertension Sister      Review of Systems: [y] = yes, [ ]  = no       General: Weight gain [] ; Weight loss [ ] ; Anorexia [ ] ; Fatigue [ ] ; Fever [ ] ; Chills [ ] ; Weakness [ ]     Cardiac: See above  Pulmonary: Cough [ ] ; Wheezing[ ] ; Hemoptysis[ ] ; Sputum [ ] ; Snoring [ ]     GI: Vomiting[ ] ; Dysphagia[ ] ; Melena[ ] ;  Hematochezia [ ] ; Heartburn[ ] ; Abdominal pain [ ] ; Constipation [ ] ; Diarrhea [ ] ; BRBPR [ ]     GU: Hematuria[ ] ; Dysuria [ ] ; Nocturia[ ]   Vascular: Pain in legs with walking [ ] ; Pain in feet with lying flat [ ] ; Non-healing sores [ ] ; Stroke [ ] ; TIA [ ] ; Slurred speech [ ] ;    Neuro: Headaches[ ] ; Vertigo[ ] ; Seizures[ ] ; Paresthesias[ ] ;Blurred vision [ ] ; Diplopia [ ] ;  Vision changes [ ]     Ortho/Skin: Arthritis [ ] ; Joint pain [ ] ; Muscle pain [ ] ; Joint swelling [ ] ; Back Pain [ ] ; Rash [ ]     Psych: Depression[ ] ; Anxiety[ ]     Heme: Bleeding problems [ ] ; Clotting disorders [ ] ; Anemia [ ]     Endocrine: Diabetes [ ] ; Thyroid dysfunction[ ]   Physical Exam: Blood pressure (!) 156/129, pulse 95, temperature 98.7 F (37.1 C), temperature source Oral, resp. rate (!) 22, height 5\' 6"  (1.676 m), weight 99.8 kg, SpO2 93 %.   GENERAL: Patient is afebrile, Vital signs reviewed, Patient appears comfortable and not in extremis, Alert and lucid. HEENT:  normocephalic, atraumatic , normal ENT inspection. CARD:  regular rate and rhythm, heart sounds normal; difficult assessment of JVD given body habitus. RESP: Bibasilar inspiratory crackles with occasional wheezing, no increased work of breathing ABD: soft, moderately distended without rebound or guarding, nontender to palpation, BS present,  EXT: 2+ lower extremity edema to the level of the thighs bilaterally SKIN: color normal, no rash, warm, dry. NEURO: awake & alert, lucid, no motor/sensory deficit. PSYCH: mood/affect normal.   Labs: Lab Results  Component Value Date   BUN 29 (H) 07/15/2019   Lab Results  Component Value Date   CREATININE 1.59 (H) 07/15/2019   Lab Results  Component Value Date   NA 139 07/15/2019   K 3.2 (L) 07/15/2019   CL 101 07/15/2019   CO2 28 07/15/2019   Lab Results  Component Value Date   TROPONINI 1.68 (HH) 02/05/2018   Lab Results  Component Value Date   WBC 8.0 07/15/2019   HGB 14.8  07/15/2019   HCT 45.5 07/15/2019   MCV 82.9 07/15/2019   PLT 256 07/15/2019   Lab Results  Component Value Date   CHOL 230 (H) 02/06/2018   HDL 37 (L) 02/06/2018   LDLCALC 151 (H) 02/06/2018   TRIG 209 (H) 02/06/2018   CHOLHDL 6.2 02/06/2018   Lab Results  Component Value Date   ALT 22 07/15/2019   AST 28 07/15/2019   ALKPHOS 37 (L) 07/15/2019   BILITOT 1.3 (H) 07/15/2019      Radiology:   CXR:   Cardiomegaly with vascular congestion and mild hazy and interstitial perihilar opacities suspect for minimal edema  EKG:  NSR, LVH with repol abnormalities, no ischemic changes    ASSESSMENT AND PLAN:   Steven Chase is a 33 y.o. male with chronic NICM (EF 20-25% in 07/2018), poorly controlled HTN, OSA, and obesity who presents with 1 day of chest pain.    On work-up patient found to be hypertensive with elevated troponin and BNP.  Physical exam concerning for volume overload.  Altogether clinical picture concerning for decompensated heart failure.  While patient's weight today is actually down from where it was 1 month ago, my guess is that this value is spurious given how he appears on exam.  Unclear what his chest pain and troponin elevation is due to, but with normal coronaries on left heart cath a little over a year ago, I am reassured that his troponinemia is not secondary to primary thrombotic occlusion.   # NICM # CHF Exacerbation :: Likely due to medication and dietary nonadherence. - 80mg  IV Lasix now -Strict I's and O's -Daily weights -Continue to monitor vital signs closely -Resume patient's GDMT  -Carvedilol 25 mg twice daily  -Sacubitril-Valsartan BID  -Isosorbide-hydralazine twice daily  - Hold Spironolactone 25mg  daily for now in setting of AKI  -  Hold digoxin, as it does not appear that patient was taking this consistently - Repeat TTE in AM - Low salt diet and 2L fluid restriction  # Chest/Epigastric Pain # Troponin Elevation :: Chest pain free now.  Clean coronaries in 01/2018. Lipase normal. More likely that troponin leak is a Type II event (supply demand mismatch). - Trend troponin and serial ECGs, especially if patient's chest pain recurs - Will hold off on heparin at this time - TTE in AM to evaluate for pericardial inflammation or fluid accumulation concerning for pericarditis  # HTN - Continue home antihypertensives as outlined above  # OSA - Order CPAP  # Obesity - No active inpatient issues identified. Will need further counseling to help reduce this modifiable risk factor   Signed: Ralene Ok 07/15/2019, 11:11 PM

## 2019-07-15 NOTE — ED Notes (Signed)
Dr. Pickering aware of elevated troponin.  

## 2019-07-15 NOTE — ED Notes (Signed)
Patient is being discharged from the [facility] and sent to the Emergency Department via wheelchair by staff. Per Dr Joseph Art, patient is stable but in need of higher level of care due to chest pain /abnormal EKG. Patient is aware and verbalizes understanding of plan of care.   Vitals:   07/15/19 1651  BP: (!) 158/112  Pulse: 90  Resp: (!) 22  Temp: 98.5 F (36.9 C)  SpO2: 92%

## 2019-07-16 ENCOUNTER — Inpatient Hospital Stay (HOSPITAL_COMMUNITY): Payer: Self-pay

## 2019-07-16 DIAGNOSIS — I340 Nonrheumatic mitral (valve) insufficiency: Secondary | ICD-10-CM

## 2019-07-16 DIAGNOSIS — I169 Hypertensive crisis, unspecified: Secondary | ICD-10-CM

## 2019-07-16 DIAGNOSIS — I509 Heart failure, unspecified: Secondary | ICD-10-CM

## 2019-07-16 DIAGNOSIS — I5023 Acute on chronic systolic (congestive) heart failure: Secondary | ICD-10-CM

## 2019-07-16 LAB — CBC
HCT: 49.5 % (ref 39.0–52.0)
Hemoglobin: 16 g/dL (ref 13.0–17.0)
MCH: 26.8 pg (ref 26.0–34.0)
MCHC: 32.3 g/dL (ref 30.0–36.0)
MCV: 82.8 fL (ref 80.0–100.0)
Platelets: 261 10*3/uL (ref 150–400)
RBC: 5.98 MIL/uL — ABNORMAL HIGH (ref 4.22–5.81)
RDW: 14.2 % (ref 11.5–15.5)
WBC: 9.5 10*3/uL (ref 4.0–10.5)
nRBC: 0 % (ref 0.0–0.2)

## 2019-07-16 LAB — BASIC METABOLIC PANEL
Anion gap: 13 (ref 5–15)
BUN: 23 mg/dL — ABNORMAL HIGH (ref 6–20)
CO2: 27 mmol/L (ref 22–32)
Calcium: 8.6 mg/dL — ABNORMAL LOW (ref 8.9–10.3)
Chloride: 100 mmol/L (ref 98–111)
Creatinine, Ser: 1.36 mg/dL — ABNORMAL HIGH (ref 0.61–1.24)
GFR calc Af Amer: >60 mL/min (ref >60)
GFR calc non Af Amer: >60 mL/min (ref >60)
Glucose, Bld: 104 mg/dL — ABNORMAL HIGH (ref 70–99)
Potassium: 3.4 mmol/L — ABNORMAL LOW (ref 3.5–5.1)
Sodium: 140 mmol/L (ref 135–145)

## 2019-07-16 LAB — ECHOCARDIOGRAM COMPLETE
Height: 66 in
Weight: 4019.2 oz

## 2019-07-16 LAB — TROPONIN I (HIGH SENSITIVITY): Troponin I (High Sensitivity): 96 ng/L — ABNORMAL HIGH (ref <18)

## 2019-07-16 LAB — LACTIC ACID, PLASMA: Lactic Acid, Venous: 1.2 mmol/L (ref 0.5–1.9)

## 2019-07-16 LAB — SARS CORONAVIRUS 2 BY RT PCR (HOSPITAL ORDER, PERFORMED IN ~~LOC~~ HOSPITAL LAB): SARS Coronavirus 2: NEGATIVE

## 2019-07-16 LAB — MAGNESIUM: Magnesium: 1.9 mg/dL (ref 1.7–2.4)

## 2019-07-16 LAB — SEDIMENTATION RATE: Sed Rate: 2 mm/hr (ref 0–16)

## 2019-07-16 MED ORDER — FUROSEMIDE 10 MG/ML IJ SOLN
80.0000 mg | Freq: Two times a day (BID) | INTRAMUSCULAR | Status: DC
  Administered 2019-07-16 – 2019-07-18 (×4): 80 mg via INTRAVENOUS
  Filled 2019-07-16 (×4): qty 8

## 2019-07-16 MED ORDER — POTASSIUM CHLORIDE CRYS ER 20 MEQ PO TBCR
40.0000 meq | EXTENDED_RELEASE_TABLET | Freq: Two times a day (BID) | ORAL | Status: DC
  Administered 2019-07-16: 40 meq via ORAL
  Filled 2019-07-16: qty 2

## 2019-07-16 MED ORDER — SPIRONOLACTONE 25 MG PO TABS
25.0000 mg | ORAL_TABLET | Freq: Every day | ORAL | Status: DC
  Administered 2019-07-16: 25 mg via ORAL
  Filled 2019-07-16: qty 1

## 2019-07-16 MED ORDER — INFLUENZA VAC SPLIT QUAD 0.5 ML IM SUSY
0.5000 mL | PREFILLED_SYRINGE | INTRAMUSCULAR | Status: AC
  Administered 2019-07-17: 0.5 mL via INTRAMUSCULAR
  Filled 2019-07-16: qty 0.5

## 2019-07-16 MED ORDER — PNEUMOCOCCAL VAC POLYVALENT 25 MCG/0.5ML IJ INJ
0.5000 mL | INJECTION | INTRAMUSCULAR | Status: AC
  Administered 2019-07-17: 0.5 mL via INTRAMUSCULAR

## 2019-07-16 MED ORDER — MAGNESIUM SULFATE 2 GM/50ML IV SOLN
2.0000 g | Freq: Once | INTRAVENOUS | Status: AC
  Administered 2019-07-16: 2 g via INTRAVENOUS
  Filled 2019-07-16: qty 50

## 2019-07-16 MED ORDER — PERFLUTREN LIPID MICROSPHERE
1.0000 mL | INTRAVENOUS | Status: AC | PRN
  Administered 2019-07-16: 2 mL via INTRAVENOUS
  Filled 2019-07-16: qty 10

## 2019-07-16 NOTE — Progress Notes (Signed)
Admit pt from er, pt is stable, elvate b/p,orieted to room, equipment and care plan, rest in bed, will conitinue monitor

## 2019-07-16 NOTE — Progress Notes (Signed)
  Echocardiogram 2D Echocardiogram has been performed.  Steven Chase 07/16/2019, 2:54 PM

## 2019-07-16 NOTE — Consult Note (Addendum)
Advanced Heart Failure Team Consult Note   Primary Physician: Corwin LevinsJohn, [name], MD PCP-Cardiologist:  Kristeen MissPhilip Nahser, MD  HF MD: Gala RomneyBensimhon  Requesting MD: Anne FuSkains   Reason for Consultation: ADHF  HPI:    Steven Chase is a 33 y.o. male with chronic NICM (EF 15-20% in 07/2018), poorly controlled HTN, OSA, and obesity who was admitted with CP and ADHF in setting of uncontrolled HTN/HTN crisis   Patient presented to ER on 9/12 with CP. Reports that pain was in the lower part of his midsternal chest/epigastrium without radiation.  He rated the pain a 3 out of 10 in severity, at its worse.  Denies any clear precipitating factors but states that the pain may have gotten worse with moving around and actually improved with lying still in bed. He also reports some mild shortness of breath with exertion over the same time span. Denies increasing edema, orthopnea or PND,   He reports that he has been adherent with most of his medications, except for 2 that he has not taken in some time (he is not sure which medications these are).  He does not routinely check his blood pressures at home or weigh himself.  The last time that he was seen in heart failure clinic was back in February 2020.  His weight on admit was 254 pounds. Last recorded weight 1 month ago was 237 lbs; and over the past year he has been in the 242 to 250 lbs range.  In ED BP 158/112, heart rate 90, satting 92% on room air.  CMP was notable for K of 3.2, elevated BUN and creatinine (creatinine 1.59 from baseline of approximately 1.2).  CBC was within normal limits.  BNP was elevated at 1024.4.  Digoxin level was < 0.2 initial troponin was elevated at 111.  EKG showed normal sinus rhythm with LVH and incomplete left bundle branch block.  Chest x-ray was concerning for cardiomegaly with vascular congestion and minimal pulmonary edema.    He was admitted and given 1 dose IV lasix at [facility] last night. (none since) HF meds restarted. Hs trop  111-> 96  Feels better today.   Review of Systems: [y] = yes, [ ]  = no   . General: Weight gain [ ] ; Weight loss [ ] ; Anorexia [ ] ; Fatigue [ ] ; Fever [ ] ; Chills [ ] ; Weakness [ ]   . Cardiac: Chest pain/pressure [ ] ; Resting SOB [ ] ; Exertional SOB [place][y ]; Orthopnea [ ] ; Pedal Edema [ ] ; Palpitations [ ] ; Syncope [ ] ; Presyncope [ ] ; Paroxysmal nocturnal dyspnea[ ]   . Pulmonary: Cough [ ] ; Wheezing[ ] ; Hemoptysis[ ] ; Sputum [ ] ; Snoring [ ]   . GI: Vomiting[ ] ; Dysphagia[ ] ; Melena[ ] ; Hematochezia [ ] ; Heartburn[ ] ; Abdominal pain [ ] ; Constipation [ ] ; Diarrhea [ ] ; BRBPR [ ]   . GU: Hematuria[ ] ; Dysuria [ ] ; Nocturia[ ]   . Vascular: Pain in legs with walking [ ] ; Pain in feet with lying flat [ ] ; Non-healing sores [ ] ; Stroke [ ] ; TIA [ ] ; Slurred speech [ ] ;  . Neuro: Headaches[ ] ; Vertigo[ ] ; Seizures[ ] ; Paresthesias[ ] ;Blurred vision [ ] ; Diplopia [ ] ; Vision changes [ ]   . Ortho/Skin: Arthritis [ ] ; Joint pain [ ] ; Muscle pain [ ] ; Joint swelling [ ] ; Back Pain [ ] ; Rash [ ]   . Psych: Depression[ ] ; Anxiety[ ]   . Heme: Bleeding problems [ ] ; Clotting disorders [ ] ; Anemia [ ]   . Endocrine: Diabetes [ ] ;  Thyroid dysfunction[ ]   Home Medications Prior to Admission medications   Medication Sig Start Date End Date Taking? Authorizing Provider  carvedilol (COREG) 25 MG tablet Take 1 tablet (25 mg total) by mouth 2 (two) times daily with a meal. 12/19/18  Yes Al[facility], NP  digoxin (LANOXIN) 0.125 MG tablet Take 1 tablet (125 mcg total) by mouth daily. 07/12/18  Yes Graciella Freer, PA-C  isosorbide-hydrALAZINE (BIDIL) 20-37.5 MG tablet Take 2 tablets by mouth 3 (three) times daily. 04/28/18  Yes Jorey Dollard, Bevelyn Buckles, MD  potassium chloride SA (K-DUR,KLOR-CON) 20 MEQ tablet Take 3 tablets (60 mEq total) by mouth daily. 12/19/18  Yes Al[facility], NP  predniSONE (DELTASONE) 20 MG tablet Take 2 pills a day for 3 days then take 1 pill a day for 3 days Patient taking differently:  Take 20 mg by mouth daily.  06/15/19  Yes Eustace Moore, MD  sacubitril-valsartan (ENTRESTO) 97-103 MG Take 1 tablet by mouth 2 (two) times daily. 09/26/18  Yes Al[facility], NP  spironolactone (ALDACTONE) 25 MG tablet Take 1 tablet (25 mg total) by mouth daily. 07/12/18  Yes Graciella Freer, PA-C    Past Medical History: Past Medical History:  Diagnosis Date  . Hypertension   . Obesity   . OSA (obstructive sleep apnea) 11/05/2014   02/2015 -severe -AHI 106/h     Past Surgical History: Past Surgical History:  Procedure Laterality Date  . HERNIA REPAIR    . RIGHT/LEFT HEART CATH AND CORONARY ANGIOGRAPHY N/A 02/07/2018   Procedure: RIGHT/LEFT HEART CATH AND CORONARY ANGIOGRAPHY;  Surgeon: Marykay Lex, MD;  Location: [facility] INVASIVE CV LAB;  Service: Cardiovascular;  Laterality: N/A;    Family History: Family History  Problem Relation Age of Onset  . Hypertension Mother   . Heart disease Maternal Grandfather   . Hypertension Maternal Grandfather   . Hypertension Sister     Social History: Social History   Socioeconomic History  . Marital status: Single    Spouse name: Not on file  . Number of children: 1  . Years of education: 53  . Highest education level: Not on file  Occupational History  . Occupation: Therapist, music: [employer]  Social Needs  . Financial resource strain: Not on file  . Food insecurity    Worry: Not on file    Inability: Not on file  . Transportation needs    Medical: Not on file    Non-medical: Not on file  Tobacco Use  . Smoking status: Never Smoker  . Smokeless tobacco: Never Used  Substance and Sexual Activity  . Alcohol use: No  . Drug use: No  . Sexual activity: Not on file  Lifestyle  . Physical activity    Days per week: Not on file    Minutes per session: Not on file  . Stress: Not on file  Relationships  . Social Musician on phone: Not on file    Gets together: Not on file    Attends  religious service: Not on file    Active member of club or organization: Not on file    Attends meetings of clubs or organizations: Not on file    Relationship status: Not on file  Other Topics Concern  . Not on file  Social History Narrative   Born and raised in [place], [place]. Currently live in a private residence with his mother. Fun: Play with his daughter.  Denies religious beliefs that would effect health care.     Allergies:  No Known Allergies  Objective:    Vital Signs:   Temp:  [98 F (36.7 C)-98.7 F (37.1 C)] 98.2 F (36.8 C) (09/13 1114) Pulse Rate:  [77-98] 77 (09/13 1114) Resp:  [16-30] 20 (09/13 1114) BP: (113-167)/(62-129) 113/62 (09/13 1114) SpO2:  [92 %-97 %] 92 % (09/13 1114) Weight:  [99.8 kg-115.8 kg] 113.9 kg (09/13 0354) Last BM Date: 07/15/19  Weight change: Filed Weights   07/15/19 1721 07/15/19 2326 07/16/19 0354  Weight: 99.8 kg 115.8 kg 113.9 kg    Intake/Output:   Intake/Output Summary (Last 24 hours) at 07/16/2019 1518 Last data filed at 07/16/2019 1348 Gross per 24 hour  Intake 1953.27 ml  Output 2300 ml  Net -346.73 ml      Physical Exam    General:  Obese male sitting in bed No resp difficulty HEENT: normal Neck: supple. JVP hard to see Looks elevated . Carotids 2+ bilat; no bruits. No lymphadenopathy or thyromegaly appreciated. Cor: PMI nondisplaced. Regular rate & rhythm. +s4 Lungs: clear Abdomen: obese soft, nontender, + distended. No hepatosplenomegaly. No bruits or masses. Good bowel sounds. Extremities: no cyanosis, clubbing, rash, 1-2+ edema. warm Neuro: alert & orientedx3, cranial nerves grossly intact. moves all 4 extremities w/o difficulty. Affect pleasant   Telemetry   NSR 80s Personally reviewed  EKG    ECG 07/15/19: NSR 87 LVH with repol. Personally reviewed   Labs   Basic Metabolic Panel: Recent Labs  Lab 07/15/19 1729 07/16/19 0528  NA 139 140  K 3.2* 3.4*  CL 101 100  CO2 28 27  GLUCOSE 109*  104*  BUN 29* 23*  CREATININE 1.59* 1.36*  CALCIUM 8.7* 8.6*  MG  --  1.9    Liver Function Tests: Recent Labs  Lab 07/15/19 1729  AST 28  ALT 22  ALKPHOS 37*  BILITOT 1.3*  PROT 6.1*  ALBUMIN 3.4*   Recent Labs  Lab 07/15/19 1729  LIPASE 32   No results for input(s): AMMONIA in the last 168 hours.  CBC: Recent Labs  Lab 07/15/19 1729 07/16/19 0528  WBC 8.0 9.5  HGB 14.8 16.0  HCT 45.5 49.5  MCV 82.9 82.8  PLT 256 261    Cardiac Enzymes: No results for input(s): CKTOTAL, CKMB, CKMBINDEX, TROPONINI in the last 168 hours.  BNP: BNP (last 3 results) Recent Labs    08/03/18 0911 07/15/19 1927  BNP 74.6 1,024.4*    ProBNP (last 3 results) No results for input(s): PROBNP in the last 8760 hours.   CBG: No results for input(s): GLUCAP in the last 168 hours.  Coagulation Studies: No results for input(s): LABPROT, INR in the last 72 hours.   Imaging   Dg Chest Portable 1 View  Result Date: 07/15/2019 CLINICAL DATA:  Epigastric pain EXAM: PORTABLE CHEST 1 VIEW COMPARISON:  02/04/2018 FINDINGS: Cardiomegaly with vascular congestion. Mild hazy and interstitial perihilar opacity. No pleural effusion. No focal consolidation or pneumothorax. IMPRESSION: Cardiomegaly with vascular congestion and mild hazy and interstitial perihilar opacities suspect for minimal edema Electronically Signed   By: Donavan Foil M.D.   On: 07/15/2019 18:41      Medications:     Current Medications: . carvedilol  25 mg Oral BID WC  . enoxaparin (LOVENOX) injection  40 mg Subcutaneous Daily  . [START ON 07/17/2019] influenza vac split quadrivalent PF  0.5 mL Intramuscular Tomorrow-1000  . isosorbide-hydrALAZINE  2 tablet Oral TID  . [  START ON 07/17/2019] pneumococcal 23 valent vaccine  0.5 mL Intramuscular Tomorrow-1000  . sacubitril-valsartan  1 tablet Oral BID  . sodium chloride flush  3 mL Intravenous Q12H  . spironolactone  25 mg Oral Daily     Infusions: . sodium  chloride Stopped (07/16/19 0715)       Assessment/Plan    1. CP - Not ACS. Likely due to ADHF in setting of HTN crisis - Trop minimally elevated and flat - cath with no CAD 4/19  2. Acute on chronic systolic CHF in setting of hypertensive crisis and medication non-complaince - NICM by cath 02/07/18, with low cardiac output. Suspected primarily HTN cardiomyopathy -Echo4/04/2018 LVEF 15%, Grade 2 DD, Trivial AI, Mild MR, Mild LAE, PA peak pressure 38 mm Hg, small pericardial effusion noted. - cMRI4/9/19 LVEF 19%, Normal RV, Discrete nodule in mid inferior wall concerning for possible Sarcoid involvement. No amyloid. - Echo 07/2018  EF 25%. Referred to EP for ICD, but canceled appointment. He is not interested in ICD. - Echo 07/16/19 EF 20% mild RV HK - Volume status elevated in setting of non-compliance and poorly-controlled HTN - Resume IV diuretics  - HF meds have been started by general team.  - Continue carvedilol 25 bid as tolerrated - Continue Bidil 2 tabs tid - Spiro 25 daily - Entresto 97/103 bid - Consider SGLT2i  3. Poorly controlled HTN -> HTN crisis - In setting of med noncompliance. Meds restarted. Will follow - Would be careful not to drop SBP < 130 acutely  4. OSA - Sleep study 03/30/18 with severe OSA. AHI 81.4 with sats down to 63% - Following with Dr. Mayford Knifeurner.  - Noncompliant with CPAP.  No change.   5. CKD III - Baseline 1.3-1.5  6. Obesity - Body mass index is 40.54 kg/m.   7. Hypokalemia/hypomag - supp  Length of Stay: 1  Arvilla Meresaniel Cheston Coury, MD  07/16/2019, 3:18 PM  Advanced Heart Failure Team Pager [phone] (M-F; 7a - 4p)  Please contact [facility] for night-coverage after hours (4p -7a ) and weekends on [email or web address]

## 2019-07-16 NOTE — Progress Notes (Signed)
Progress Note  Patient Name: Steven Chase Date of Encounter: 07/16/2019  Primary Cardiologist: Kristeen Miss, MD   Subjective   Mild shortness of breath not at baseline, no chest pain currently.  Inpatient Medications    Scheduled Meds: . carvedilol  25 mg Oral BID WC  . enoxaparin (LOVENOX) injection  40 mg Subcutaneous Daily  . [START ON 07/17/2019] influenza vac split quadrivalent PF  0.5 mL Intramuscular Tomorrow-1000  . isosorbide-hydrALAZINE  2 tablet Oral TID  . [START ON 07/17/2019] pneumococcal 23 valent vaccine  0.5 mL Intramuscular Tomorrow-1000  . sacubitril-valsartan  1 tablet Oral BID  . sodium chloride flush  3 mL Intravenous Q12H   Continuous Infusions: . sodium chloride 250 mL (07/16/19 0308)   PRN Meds: sodium chloride, acetaminophen, albuterol, sodium chloride flush   Vital Signs    Vitals:   07/15/19 2339 07/16/19 0131 07/16/19 0354 07/16/19 0718  BP: (!) 157/120 (!) 149/107 (!) 159/117 (!) 155/112  Pulse:  98 96 88  Resp:   18 20  Temp:   98.2 F (36.8 C) 98.4 F (36.9 C)  TempSrc:   Oral Oral  SpO2:   97% 94%  Weight:   113.9 kg   Height:        Intake/Output Summary (Last 24 hours) at 07/16/2019 0937 Last data filed at 07/16/2019 0801 Gross per 24 hour  Intake 1473.27 ml  Output 2300 ml  Net -826.73 ml   Last 3 Weights 07/16/2019 07/15/2019 07/15/2019  Weight (lbs) 251 lb 3.2 oz 255 lb 6.4 oz 220 lb  Weight (kg) 113.944 kg 115.849 kg 99.791 kg      Telemetry    No adverse arrhythmias- Personally Reviewed  ECG    LVH sinus rhythm- Personally Reviewed  Physical Exam   GEN: No acute distress.   Neck: No JVD Cardiac: RRR, no murmurs, rubs, or gallops.  Respiratory: Clear to auscultation bilaterally. GI: Soft, nontender, non-distended  MS: No edema; No deformity. Neuro:  Nonfocal  Psych: Normal affect   Labs    High Sensitivity Troponin:   Recent Labs  Lab 07/15/19 1729 07/16/19 0009  TROPONINIHS 111* 96*       Chemistry Recent Labs  Lab 07/15/19 1729 07/16/19 0528  NA 139 140  K 3.2* 3.4*  CL 101 100  CO2 28 27  GLUCOSE 109* 104*  BUN 29* 23*  CREATININE 1.59* 1.36*  CALCIUM 8.7* 8.6*  PROT 6.1*  --   ALBUMIN 3.4*  --   AST 28  --   ALT 22  --   ALKPHOS 37*  --   BILITOT 1.3*  --   GFRNONAA 56* >60  GFRAA >60 >60  ANIONGAP 10 13     Hematology Recent Labs  Lab 07/15/19 1729 07/16/19 0528  WBC 8.0 9.5  RBC 5.49 5.98*  HGB 14.8 16.0  HCT 45.5 49.5  MCV 82.9 82.8  MCH 27.0 26.8  MCHC 32.5 32.3  RDW 14.0 14.2  PLT 256 261    BNP Recent Labs  Lab 07/15/19 1927  BNP 1,024.4*     DDimer No results for input(s): DDIMER in the last 168 hours.   Radiology    Dg Chest Portable 1 View  Result Date: 07/15/2019 CLINICAL DATA:  Epigastric pain EXAM: PORTABLE CHEST 1 VIEW COMPARISON:  02/04/2018 FINDINGS: Cardiomegaly with vascular congestion. Mild hazy and interstitial perihilar opacity. No pleural effusion. No focal consolidation or pneumothorax. IMPRESSION: Cardiomegaly with vascular congestion and mild hazy and interstitial perihilar opacities  suspect for minimal edema Electronically Signed   By: Donavan Foil M.D.   On: 07/15/2019 18:41    Cardiac Studies   EF 20 19 -15 to 20%.  Nonischemic cardiomyopathy.  Patient Profile     33 y.o. male with nonischemic cardiomyopathy, severely reduced ejection fraction approximately 20% with poorly controlled hypertension and obstructive sleep apnea obesity.  Came in with chest pain.  Assessment & Plan    Chest pain-normal coronaries 01/2018. -Likely related to underlying cardiomyopathy/musculoskeletal given that it potentially got worse after moving around and improved with lying still, or underlying severe hypertension related to noncompliance. - No further ischemic testing. -Elevated troponin secondary to chronic underlying cardiomyopathy.  Not ACS.  Uncontrolled hypertension - 158/112, noncompliance. -Restarted his  medications which include high-dose Entresto, max dose/goal dose carvedilol, BiDil.  Hopefully blood pressure will follow as diuresis takes place and medications are reinitiated.  Dilated cardiomyopathy likely hypertensive, acute on chronic systolic heart failure - As medications above, also on digoxin 0.125 daily. -Agree with 80 mg of IV Lasix.  Spironolactone currently on hold with slightly worsened creatinine.  .  Creatinine has improved this morning.  1.6-1.3.  I will resume.  Output yesterday approximately 1.2 L  Continue to monitor.  He has seen Dr. Haroldine Laws virtually last on 03/20/2019.  For questions or updates, please contact [facility] Please consult [email or web address] for contact info under        Signed, Candee Furbish, MD  07/16/2019, 9:37 AM

## 2019-07-16 NOTE — Progress Notes (Signed)
Dr Missy Sabins at bedside, advised telemetry had called about brief episode of brady/junction 40-50's, pt alseep and no distress, awaken and no symptoms, per Dr B, pt has severe sleep apnea and needs to be on CPAP at bedtime or asleep.

## 2019-07-16 NOTE — Progress Notes (Signed)
RT set up CPAP and instructed patient on how to operate.  Patient stated he would place himself on when he is ready to sleep. RT instructed patient to have RT called if he needs assistance. RN is aware. RT will monitor as needed.

## 2019-07-16 NOTE — Plan of Care (Signed)

## 2019-07-17 DIAGNOSIS — E876 Hypokalemia: Secondary | ICD-10-CM

## 2019-07-17 DIAGNOSIS — G4733 Obstructive sleep apnea (adult) (pediatric): Secondary | ICD-10-CM

## 2019-07-17 DIAGNOSIS — I10 Essential (primary) hypertension: Secondary | ICD-10-CM

## 2019-07-17 DIAGNOSIS — I5043 Acute on chronic combined systolic (congestive) and diastolic (congestive) heart failure: Secondary | ICD-10-CM

## 2019-07-17 DIAGNOSIS — Z9114 Patient's other noncompliance with medication regimen: Secondary | ICD-10-CM

## 2019-07-17 LAB — CBC
HCT: 52.5 % — ABNORMAL HIGH (ref 39.0–52.0)
Hemoglobin: 16.6 g/dL (ref 13.0–17.0)
MCH: 26.4 pg (ref 26.0–34.0)
MCHC: 31.6 g/dL (ref 30.0–36.0)
MCV: 83.5 fL (ref 80.0–100.0)
Platelets: 271 10*3/uL (ref 150–400)
RBC: 6.29 MIL/uL — ABNORMAL HIGH (ref 4.22–5.81)
RDW: 14.1 % (ref 11.5–15.5)
WBC: 7.5 10*3/uL (ref 4.0–10.5)
nRBC: 0 % (ref 0.0–0.2)

## 2019-07-17 LAB — GLUCOSE, CAPILLARY: Glucose-Capillary: 104 mg/dL — ABNORMAL HIGH (ref 70–99)

## 2019-07-17 LAB — BASIC METABOLIC PANEL
Anion gap: 13 (ref 5–15)
BUN: 18 mg/dL (ref 6–20)
CO2: 27 mmol/L (ref 22–32)
Calcium: 8.7 mg/dL — ABNORMAL LOW (ref 8.9–10.3)
Chloride: 100 mmol/L (ref 98–111)
Creatinine, Ser: 1.37 mg/dL — ABNORMAL HIGH (ref 0.61–1.24)
GFR calc Af Amer: >60 mL/min (ref >60)
GFR calc non Af Amer: >60 mL/min (ref >60)
Glucose, Bld: 100 mg/dL — ABNORMAL HIGH (ref 70–99)
Potassium: 3.2 mmol/L — ABNORMAL LOW (ref 3.5–5.1)
Sodium: 140 mmol/L (ref 135–145)

## 2019-07-17 LAB — MAGNESIUM: Magnesium: 2.2 mg/dL (ref 1.7–2.4)

## 2019-07-17 MED ORDER — SPIRONOLACTONE 25 MG PO TABS
50.0000 mg | ORAL_TABLET | Freq: Every day | ORAL | Status: DC
  Administered 2019-07-17 – 2019-07-19 (×3): 50 mg via ORAL
  Filled 2019-07-17 (×3): qty 2

## 2019-07-17 MED ORDER — METOLAZONE 2.5 MG PO TABS
2.5000 mg | ORAL_TABLET | Freq: Once | ORAL | Status: AC
  Administered 2019-07-17: 2.5 mg via ORAL
  Filled 2019-07-17: qty 1

## 2019-07-17 MED ORDER — POTASSIUM CHLORIDE CRYS ER 20 MEQ PO TBCR
40.0000 meq | EXTENDED_RELEASE_TABLET | Freq: Three times a day (TID) | ORAL | Status: DC
  Administered 2019-07-17 – 2019-07-19 (×7): 40 meq via ORAL
  Filled 2019-07-17 (×7): qty 2

## 2019-07-17 NOTE — Progress Notes (Signed)
CARDIAC REHAB PHASE I   Had a long talk with pt and pts mom about HF, and ways to manage at home. Pt admits to not weighing, checking BP or wearing CPAP at home. Discussed importance of all three. Pt states he does not want to keep ending up in hospital and seems more willing to comply. Pt given HF booklet and low sodium diets. Encouraged ambulation. Will follow-up tomorrow.  [id number] Rufina Falco, RN BSN 07/17/2019 11:58 AM

## 2019-07-17 NOTE — Progress Notes (Signed)
Progress Note  Patient Name: Steven Chase Date of Encounter: 07/17/2019  Primary Cardiologist: Mertie Moores, MD   Subjective   Breathing is better. Weight down 3.4 kg since admission. UO not fully recorded. Currently 248 lb, he believes his dry weight is 240 lb. Stable creat, improving BUN, hypokalemia is worse. Asleep in bed when I walked in, snoring and obstructing, CPAP mask lying on his chest.  Inpatient Medications    Scheduled Meds: . carvedilol  25 mg Oral BID WC  . enoxaparin (LOVENOX) injection  40 mg Subcutaneous Daily  . furosemide  80 mg Intravenous BID  . influenza vac split quadrivalent PF  0.5 mL Intramuscular Tomorrow-1000  . isosorbide-hydrALAZINE  2 tablet Oral TID  . pneumococcal 23 valent vaccine  0.5 mL Intramuscular Tomorrow-1000  . potassium chloride  40 mEq Oral BID  . sacubitril-valsartan  1 tablet Oral BID  . sodium chloride flush  3 mL Intravenous Q12H  . spironolactone  25 mg Oral Daily   Continuous Infusions: . sodium chloride Stopped (07/16/19 0715)   PRN Meds: sodium chloride, acetaminophen, albuterol, sodium chloride flush   Vital Signs    Vitals:   07/16/19 2058 07/17/19 0014 07/17/19 0018 07/17/19 0602  BP:  (!) 131/96 (!) 131/96 (!) 147/109  Pulse: 86 80 88 75  Resp: 18 20 20 20   Temp:  97.7 F (36.5 C) 97.7 F (36.5 C) (!) 97.4 F (36.3 C)  TempSrc:  Oral Oral Oral  SpO2: 94% 94% 94% 92%  Weight:   112.4 kg   Height:        Intake/Output Summary (Last 24 hours) at 07/17/2019 0831 Last data filed at 07/17/2019 0056 Gross per 24 hour  Intake 1250 ml  Output 200 ml  Net 1050 ml   Last 3 Weights 07/17/2019 07/16/2019 07/15/2019  Weight (lbs) 247 lb 12.8 oz 251 lb 3.2 oz 255 lb 6.4 oz  Weight (kg) 112.401 kg 113.944 kg 115.849 kg      Telemetry    NSR - Personally Reviewed  ECG    NSR, LVH w broad QRS 106 ms, prolonged QTc 498 ms (07/15/2019)- Personally Reviewed  Physical Exam  Morbid obesity GEN: No acute  distress.   Neck: No JVD Cardiac: RRR, no murmurs, rubs, has intermittent S3 Respiratory: Clear to auscultation bilaterally. GI: Soft, nontender, non-distended  MS: 1+ symmetrical pretibial edema; No deformity. Neuro:  Nonfocal  Psych: Normal affect   Labs    High Sensitivity Troponin:   Recent Labs  Lab 07/15/19 1729 07/16/19 0009  TROPONINIHS 111* 96*      Chemistry Recent Labs  Lab 07/15/19 1729 07/16/19 0528 07/17/19 0626  NA 139 140 140  K 3.2* 3.4* 3.2*  CL 101 100 100  CO2 28 27 27   GLUCOSE 109* 104* 100*  BUN 29* 23* 18  CREATININE 1.59* 1.36* 1.37*  CALCIUM 8.7* 8.6* 8.7*  PROT 6.1*  --   --   ALBUMIN 3.4*  --   --   AST 28  --   --   ALT 22  --   --   ALKPHOS 37*  --   --   BILITOT 1.3*  --   --   GFRNONAA 56* >60 >60  GFRAA >60 >60 >60  ANIONGAP 10 13 13      Hematology Recent Labs  Lab 07/15/19 1729 07/16/19 0528 07/17/19 0626  WBC 8.0 9.5 7.5  RBC 5.49 5.98* 6.29*  HGB 14.8 16.0 16.6  HCT 45.5 49.5 52.5*  MCV 82.9 82.8 83.5  MCH 27.0 26.8 26.4  MCHC 32.5 32.3 31.6  RDW 14.0 14.2 14.1  PLT 256 261 271    BNP Recent Labs  Lab 07/15/19 1927  BNP 1,024.4*     DDimer No results for input(s): DDIMER in the last 168 hours.   Radiology    Dg Chest Portable 1 View  Result Date: 07/15/2019 CLINICAL DATA:  Epigastric pain EXAM: PORTABLE CHEST 1 VIEW COMPARISON:  02/04/2018 FINDINGS: Cardiomegaly with vascular congestion. Mild hazy and interstitial perihilar opacity. No pleural effusion. No focal consolidation or pneumothorax. IMPRESSION: Cardiomegaly with vascular congestion and mild hazy and interstitial perihilar opacities suspect for minimal edema Electronically Signed   By: Jasmine Pang M.D.   On: 07/15/2019 18:41    Cardiac Studies   ECHO 07/16/2019 1. The left ventricle has severely reduced systolic function, with an ejection fraction of 20-25%. The cavity size was normal. There is moderately increased left ventricular wall  thickness. Left ventricular diastolic Doppler parameters are consistent  with pseudonormalization. Left ventricular diffuse hypokinesis.  2. Left atrial size was moderately dilated.  3. Right atrial size was moderately dilated.  4. The mitral valve is degenerative. Mild thickening of the mitral valve leaflet.  5. The aortic valve is tricuspid. Aortic valve regurgitation is trivial by color flow Doppler.  6. The aorta is normal unless otherwise noted.  7. The inferior vena cava was dilated in size with <50% respiratory variability.  8. No intracardiac thrombi or masses were visualized.  CATH 02/07/2018  There is severe left ventricular systolic dysfunction.  LV end diastolic pressure is moderately elevated.  The left ventricular ejection fraction is less than 25% by visual estimate.  There is trivial (1+) mitral regurgitation.  There is no aortic valve stenosis.   Nonischemic cardiomyopathy with angiographically Normal Coronary Arteries and severely reduced Cardiac Output/Index. Moderately elevated LVEDP with relatively normal Right Heart Cath Pressures.  Patient Profile     33 y.o. male with nonischemic cardiomyopathy, severely reduced ejection fraction approximately 20% with poorly controlled hypertension and obstructive sleep apnea obesity.  Came in with chest pain.  Assessment & Plan    1. CHF: seems to be diuresing well, but UO is poorly recorded. Unsure of "dry weight", but approaching 240 lb which he reports as his usual. Still has some edema and appears orthopneic. One more day of IV diuretics. 2. Malignant HTN: DBP still markedly elevated. On max dose Entresto and high dose Bidil and carvedilol. Increase spironolactone.  3. Hypokalemia: increase spironolactone. KCl supplementation increased. Will be easier to replete once off IV loop diuretics. Mg 2.2. 4. Morbid obesity: BMI>40. 5. OSA: CPAP noncompliant. 6. Medical noncompliance.  For questions or updates, please  contact [facility] Please consult [email or web address] for contact info under        Signed, Thurmon Fair, MD  07/17/2019, 8:31 AM

## 2019-07-17 NOTE — Plan of Care (Signed)

## 2019-07-17 NOTE — TOC Progression Note (Addendum)
Transition of Care [facility]) - Progression Note    Patient Details  Name: Steven Chase MRN: [id number] Date of Birth: [birth year]  [facility] CM/SW Contact  Zenon Mayo, RN Phone Number: 07/17/2019, 2:05 PM  Clinical Narrative:    NCM spoke with patient, he states he lives at home alone, he still works, he has a cpap machine at home, CHF ex, on iv lasix, he states he would like to keep his PCP Cathlean Cower, he states he let his insurance lapse but he will be reinstating it in two weeks when he gets paid.  He states if his medicatons are over 100.00 then he will need ast , but if it is between 50 to 60.00 he can afford to pay for his meds. He states he does not need a HHRN.         Expected Discharge Plan and Services                                                 Social Determinants of Health (SDOH) Interventions    Readmission Risk Interventions No flowsheet data found.

## 2019-07-17 NOTE — Progress Notes (Signed)
Completing rounds and stopped in Mr. Steven Chase room.  I was able to talk with his family member, I believe his mother.  I told her about chaplain services and she told me to stop through Mr. Steven Chase room anytime.  I will follow-up as needed.

## 2019-07-17 NOTE — Progress Notes (Addendum)
Advanced Heart Failure Rounding Note  PCP-Cardiologist: Kristeen Miss, MD   Subjective:    Yesterday HF meds restarted. Diuresed with IV lasix. Weight down 4 pounds.   Denies SOB. Says he just ran out of medications a few days ago.     Objective:   Weight Range: 112.4 kg Body mass index is 40 kg/m.   Vital Signs:   Temp:  [97.4 F (36.3 C)-98.2 F (36.8 C)] 97.4 F (36.3 C) (09/14 0602) Pulse Rate:  [75-88] 75 (09/14 0602) Resp:  [18-20] 20 (09/14 0602) BP: (107-147)/(62-109) 147/109 (09/14 0602) SpO2:  [92 %-97 %] 92 % (09/14 0602) Weight:  [112.4 kg] 112.4 kg (09/14 0018) Last BM Date: 07/16/19  Weight change: Filed Weights   07/15/19 2326 07/16/19 0354 07/17/19 0018  Weight: 115.8 kg 113.9 kg 112.4 kg    Intake/Output:   Intake/Output Summary (Last 24 hours) at 07/17/2019 0904 Last data filed at 07/17/2019 0056 Gross per 24 hour  Intake 1250 ml  Output 200 ml  Net 1050 ml      Physical Exam    General:  No resp difficulty HEENT: Normal anicteric  Neck: Supple. JVP 11-12 . Carotids 2+ bilat; no bruits. No lymphadenopathy or thyromegaly appreciated. Cor: PMI nondisplaced. Regular rate & rhythm. No rubs, gallops or murmurs. Lungs: Clear no wheeze  Abdomen: obese Soft, nontender, distended. No hepatosplenomegaly. No bruits or masses. Good bowel sounds. Extremities: No cyanosis, clubbing, rash, R and LLE 1+  edema Neuro: alert & oriented x 3, cranial nerves grossly intact. moves all 4 extremities w/o difficulty. Affect pleasant   Telemetry   SR 80s personally reviewed.    EKG    N/A  Labs    CBC Recent Labs    07/16/19 0528 07/17/19 0626  WBC 9.5 7.5  HGB 16.0 16.6  HCT 49.5 52.5*  MCV 82.8 83.5  PLT 261 271   Basic Metabolic Panel Recent Labs    54/27/06 0528 07/17/19 0626  NA 140 140  K 3.4* 3.2*  CL 100 100  CO2 27 27  GLUCOSE 104* 100*  BUN 23* 18  CREATININE 1.36* 1.37*  CALCIUM 8.6* 8.7*  MG 1.9 2.2   Liver  Function Tests Recent Labs    07/15/19 1729  AST 28  ALT 22  ALKPHOS 37*  BILITOT 1.3*  PROT 6.1*  ALBUMIN 3.4*   Recent Labs    07/15/19 1729  LIPASE 32   Cardiac Enzymes No results for input(s): CKTOTAL, CKMB, CKMBINDEX, TROPONINI in the last 72 hours.  BNP: BNP (last 3 results) Recent Labs    08/03/18 0911 07/15/19 1927  BNP 74.6 1,024.4*    ProBNP (last 3 results) No results for input(s): PROBNP in the last 8760 hours.   D-Dimer No results for input(s): DDIMER in the last 72 hours. Hemoglobin A1C No results for input(s): HGBA1C in the last 72 hours. Fasting Lipid Panel No results for input(s): CHOL, HDL, LDLCALC, TRIG, CHOLHDL, LDLDIRECT in the last 72 hours. Thyroid Function Tests No results for input(s): TSH, T4TOTAL, T3FREE, THYROIDAB in the last 72 hours.  Invalid input(s): FREET3  Other results:   Imaging     No results found.   Medications:     Scheduled Medications: . carvedilol  25 mg Oral BID WC  . enoxaparin (LOVENOX) injection  40 mg Subcutaneous Daily  . furosemide  80 mg Intravenous BID  . influenza vac split quadrivalent PF  0.5 mL Intramuscular Tomorrow-1000  . isosorbide-hydrALAZINE  2 tablet  Oral TID  . pneumococcal 23 valent vaccine  0.5 mL Intramuscular Tomorrow-1000  . potassium chloride  40 mEq Oral TID  . sacubitril-valsartan  1 tablet Oral BID  . sodium chloride flush  3 mL Intravenous Q12H  . spironolactone  50 mg Oral Daily     Infusions: . sodium chloride Stopped (07/16/19 0715)     PRN Medications:  sodium chloride, acetaminophen, albuterol, sodium chloride flush   Assessment/Plan   1. CP - Not ACS. Likely due to ADHF in setting of HTN crisis - Trop minimally elevated and flat - cath with no CAD 4/19  2. Acute on chronic systolic CHF in setting of hypertensive crisis and medication non-complaince - NICM by cath 02/07/18, with low cardiac output. Suspected primarily HTN cardiomyopathy -Echo4/04/2018  LVEF 15%, Grade 2 DD, Trivial AI, Mild MR, Mild LAE, PA peak pressure 38 mm Hg, small pericardial effusion noted. - cMRI4/9/19 LVEF 19%, Normal RV, Discrete nodule in mid inferior wall concerning for possible Sarcoid involvement. No amyloid. - Echo 07/2018 EF 25%. Referred to EP for ICD, but canceled appointment. He is not interested in ICD. - Echo 07/16/19 EF 20% mild RV HK -Volume status elevated but improving.  - Continue IV lasix 80 mg twice a day. Give 2.5 mg metolazone today.  Add ted hose today.  -  Continue carvedilol 25 bid as tolerated - Continue Bidil 2 tabs tid - Spiro 50 daily (increased today by Dr Orene Desanctis)  - Delene Loll 97/103 bid - Consider SGLT2i  3. Poorly controlled HTN -> HTN crisis -In setting of med noncompliance.  - Would be careful not to drop SBP < 130 acutely  4. OSA - Sleep study 03/30/18 with severe OSA. AHI 81.4 with sats down to 63% - Following with Dr. Radford Pax.  - Noncompliant with CPAP. - Discussed importance of CPAP.   5. CKD III - Baseline 1.3-1.5 - Creatinine stable today.   6. Obesity Body mass index is 40 kg/m.   7. Hypokalemia/hypomag - supp K. Arlyce Harman increased as noted above.   Consult cardiac rehab. Medical insurance lapsed. I have asked him to follow up with [employer] to try and get policy restarted. If not he will need HF fund and switch from bidil to hydral/imdur.    Length of Stay: 2  Darrick Grinder, NP  07/17/2019, 9:04 AM  Advanced Heart Failure Team Pager [phone] (M-F; 7a - 4p)  Please contact [facility] for night-coverage after hours (4p -7a ) and weekends on [email or web address]  Patient seen and examined with the above-signed Advanced Practice Provider and/or Housestaff. I personally reviewed laboratory data, imaging studies and relevant notes. I independently examined the patient and formulated the important aspects of the plan. I have edited the note to reflect any of my changes or salient points. I have personally discussed the  plan with the patient and/or family.  Still with volume on board and BP high. Continue IV diuresis. Agree with metolazone. Supp K. Increase spiro. Again, long talk about need for complaince with meds and CPAP.  Consider Wilder Glade if he can afford.   Glori Bickers, MD  1:54 PM

## 2019-07-18 LAB — BASIC METABOLIC PANEL
Anion gap: 14 (ref 5–15)
BUN: 18 mg/dL (ref 6–20)
CO2: 27 mmol/L (ref 22–32)
Calcium: 9.2 mg/dL (ref 8.9–10.3)
Chloride: 96 mmol/L — ABNORMAL LOW (ref 98–111)
Creatinine, Ser: 1.56 mg/dL — ABNORMAL HIGH (ref 0.61–1.24)
GFR calc Af Amer: >60 mL/min (ref >60)
GFR calc non Af Amer: 58 mL/min — ABNORMAL LOW (ref >60)
Glucose, Bld: 134 mg/dL — ABNORMAL HIGH (ref 70–99)
Potassium: 3.8 mmol/L (ref 3.5–5.1)
Sodium: 137 mmol/L (ref 135–145)

## 2019-07-18 LAB — CBC
HCT: 56.1 % — ABNORMAL HIGH (ref 39.0–52.0)
Hemoglobin: 17.8 g/dL — ABNORMAL HIGH (ref 13.0–17.0)
MCH: 26.6 pg (ref 26.0–34.0)
MCHC: 31.7 g/dL (ref 30.0–36.0)
MCV: 84 fL (ref 80.0–100.0)
Platelets: 242 10*3/uL (ref 150–400)
RBC: 6.68 MIL/uL — ABNORMAL HIGH (ref 4.22–5.81)
RDW: 15 % (ref 11.5–15.5)
WBC: 8 10*3/uL (ref 4.0–10.5)
nRBC: 0 % (ref 0.0–0.2)

## 2019-07-18 LAB — MAGNESIUM: Magnesium: 2 mg/dL (ref 1.7–2.4)

## 2019-07-18 LAB — HIGH SENSITIVITY CRP: CRP, High Sensitivity: 4.76 mg/L — ABNORMAL HIGH (ref 0.00–3.00)

## 2019-07-18 MED ORDER — FUROSEMIDE 80 MG PO TABS
80.0000 mg | ORAL_TABLET | Freq: Every day | ORAL | Status: DC
  Filled 2019-07-18: qty 1

## 2019-07-18 MED ORDER — SACUBITRIL-VALSARTAN 97-103 MG PO TABS: 1.0000 | ORAL_TABLET | Freq: Two times a day (BID) | ORAL | 6 refills | Status: DC

## 2019-07-18 NOTE — Progress Notes (Signed)
CARDIAC REHAB PHASE I   Checked on pt. Pt denies questions or concerns regarding yesterdays education. Pts only concern at the moment is affording his meds upon discharge as he doesn't get pain until next week. RN made aware.   [phone] Rufina Falco, RN BSN 07/18/2019 10:22 AM

## 2019-07-18 NOTE — Progress Notes (Addendum)
Advanced Heart Failure Rounding Note  PCP-Cardiologist: Kristeen Miss, MD   Subjective:   Steven Chase he was diuresed with IV lasix + metolazone. Weight trending down 255>242 pounds.   Feeling better. Denies SOB.   Objective:   Weight Range: 109.8 kg Body mass index is 39.06 kg/m.   Vital Signs:   Temp:  [97.3 F (36.3 C)-98.3 F (36.8 C)] 98.3 F (36.8 C) (09/15 0734) Pulse Rate:  [76-91] 91 (09/15 0734) Resp:  [14-19] 18 (09/15 0734) BP: (96-142)/(81-104) 141/104 (09/15 0734) SpO2:  [93 %-100 %] 93 % (09/15 0734) Weight:  [109.8 kg] 109.8 kg (09/15 0657) Last BM Date: 07/16/19  Weight change: Filed Weights   07/16/19 0354 07/17/19 0018 07/18/19 0657  Weight: 113.9 kg 112.4 kg 109.8 kg    Intake/Output:   Intake/Output Summary (Last 24 hours) at 07/18/2019 0910 Last data filed at 07/18/2019 0734 Gross per 24 hour  Intake 1163 ml  Output 2452 ml  Net -1289 ml      Physical Exam   General:  Well appearing. No resp difficulty HEENT: normal anicteric  Neck: supple. Hard to assess. JVP 8-9 . Carotids 2+ bilat; no bruits. No lymphadenopathy or thryomegaly appreciated. Cor: PMI nondisplaced. Regular rate & rhythm. No rubs, gallops or murmurs. Lungs: clear no wheeze Abdomen: obese soft, nontender, nondistended. No hepatosplenomegaly. No bruits or masses. Good bowel sounds. Extremities: no cyanosis, clubbing, rash, R and LLE trace edema Neuro: alert & oriented x 3, cranial nerves grossly intact. moves all 4 extremities w/o difficulty. Affect pleasant   Telemetry   SR 80s personally reviewed.   EKG    N/A  Labs    CBC Recent Labs    07/16/19 0528 07/17/19 0626  WBC 9.5 7.5  HGB 16.0 16.6  HCT 49.5 52.5*  MCV 82.8 83.5  PLT 261 271   Basic Metabolic Panel Recent Labs    28/41/32 0626 07/18/19 0812  NA 140 137  K 3.2* 3.8  CL 100 96*  CO2 27 27  GLUCOSE 100* 134*  BUN 18 18  CREATININE 1.37* 1.56*  CALCIUM 8.7* 9.2  MG 2.2 2.0   Liver  Function Tests Recent Labs    07/15/19 1729  AST 28  ALT 22  ALKPHOS 37*  BILITOT 1.3*  PROT 6.1*  ALBUMIN 3.4*   Recent Labs    07/15/19 1729  LIPASE 32   Cardiac Enzymes No results for input(s): CKTOTAL, CKMB, CKMBINDEX, TROPONINI in the last 72 hours.  BNP: BNP (last 3 results) Recent Labs    08/03/18 0911 07/15/19 1927  BNP 74.6 1,024.4*    ProBNP (last 3 results) No results for input(s): PROBNP in the last 8760 hours.   D-Dimer No results for input(s): DDIMER in the last 72 hours. Hemoglobin A1C No results for input(s): HGBA1C in the last 72 hours. Fasting Lipid Panel No results for input(s): CHOL, HDL, LDLCALC, TRIG, CHOLHDL, LDLDIRECT in the last 72 hours. Thyroid Function Tests No results for input(s): TSH, T4TOTAL, T3FREE, THYROIDAB in the last 72 hours.  Invalid input(s): FREET3  Other results:   Imaging    No results found.   Medications:     Scheduled Medications: . carvedilol  25 mg Oral BID WC  . enoxaparin (LOVENOX) injection  40 mg Subcutaneous Daily  . furosemide  80 mg Intravenous BID  . isosorbide-hydrALAZINE  2 tablet Oral TID  . potassium chloride  40 mEq Oral TID  . sacubitril-valsartan  1 tablet Oral BID  . sodium  chloride flush  3 mL Intravenous Q12H  . spironolactone  50 mg Oral Daily    Infusions: . sodium chloride Stopped (07/16/19 0715)    PRN Medications: sodium chloride, acetaminophen, albuterol, sodium chloride flush   Assessment/Plan   1. CP - Not ACS. Likely due to ADHF in setting of HTN crisis - Trop minimally elevated and flat - cath with no CAD 4/19  2. Acute on chronic systolic CHF in setting of hypertensive crisis and medication non-complaince - NICM by cath 02/07/18, with low cardiac output. Suspected primarily HTN cardiomyopathy -Echo4/04/2018 LVEF 15%, Grade 2 DD, Trivial AI, Mild MR, Mild LAE, PA peak pressure 38 mm Hg, small pericardial effusion noted. - cMRI4/9/19 LVEF 19%, Normal RV,  Discrete nodule in mid inferior wall concerning for possible Sarcoid involvement. No amyloid. - Echo 07/2018 EF 25%. Referred to EP for ICD, but canceled appointment. He is not interested in ICD. - Echo 07/16/19 EF 20% mild RV HK -Volume status elevated but improving.  -Given one more dose of IV lasix. . Renal function stable.  - Start po lasix 80 mg daily. Low threshold to switch to  Torsemide.  He was not on a diuretic at home.  -Continue  ted hose.  -  Continue carvedilol 25 bid as tolerated - Continue Bidil 2 tabs tid - Spiro 50 daily (increased today by Dr Orene Desanctis)  - Delene Loll 97/103 bid - Consider SGLT2i - BMET pending.   3. Poorly controlled HTN -> HTN crisis -In setting of med noncompliance.  - Would be careful not to drop SBP < 130 acutely  4. OSA - Sleep study 03/30/18 with severe OSA. AHI 81.4 with sats down to 63% - Following with Dr. Radford Pax.  - Noncompliant with CPAP. - Discussed importance of CPAP.   5. CKD III - Baseline 1.3-1.5 Creatinine 1.56   6. Obesity Body mass index is 39.06 kg/m.   7. Hypokalemia/hypomag Stable.   . Medical insurance lapsed. I have asked him to follow up with [employer] to try and get policy restarted. He is waiting on a call back.  If not he will need HF fund and switch from bidil to hydral/imdur.    Length of Stay: 3  Amy Clegg, NP  07/18/2019, 9:10 AM  Advanced Heart Failure Team Pager [phone] (M-F; [place])  Please contact [facility] for night-coverage after hours (4p -7a ) and weekends on [email or web address]  Patient seen and examined with the above-signed Advanced Practice Provider and/or Housestaff. I personally reviewed laboratory data, imaging studies and relevant notes. I independently examined the patient and formulated the important aspects of the plan. I have edited the note to reflect any of my changes or salient points. I have personally discussed the plan with the patient and/or family.  Volume status much  improved. Creatinine beginning to bump. Will stop IV lasix. Swtich to po. He checked with his insurance and can not restart due to balance due. Will Supply meds from HF Fund. Have to figure out how to get [facility]. Discussed dosing with PharmD personally.  Glori Bickers, MD  2:38 PM

## 2019-07-19 LAB — BASIC METABOLIC PANEL
Anion gap: 12 (ref 5–15)
BUN: 18 mg/dL (ref 6–20)
CO2: 27 mmol/L (ref 22–32)
Calcium: 9.7 mg/dL (ref 8.9–10.3)
Chloride: 98 mmol/L (ref 98–111)
Creatinine, Ser: 1.43 mg/dL — ABNORMAL HIGH (ref 0.61–1.24)
GFR calc Af Amer: >60 mL/min (ref >60)
GFR calc non Af Amer: >60 mL/min (ref >60)
Glucose, Bld: 115 mg/dL — ABNORMAL HIGH (ref 70–99)
Potassium: 3.7 mmol/L (ref 3.5–5.1)
Sodium: 137 mmol/L (ref 135–145)

## 2019-07-19 LAB — HEMOGLOBIN A1C
Hgb A1c MFr Bld: 6 % — ABNORMAL HIGH (ref 4.8–5.6)
Mean Plasma Glucose: 126 mg/dL

## 2019-07-19 MED ORDER — ISOSORBIDE MONONITRATE ER 60 MG PO TB24: 60.0000 mg | ORAL_TABLET | Freq: Every day | ORAL | 6 refills | Status: DC

## 2019-07-19 MED ORDER — HYDRALAZINE HCL 100 MG PO TABS: 100.0000 mg | ORAL_TABLET | Freq: Three times a day (TID) | ORAL | 6 refills | Status: DC

## 2019-07-19 MED ORDER — TORSEMIDE 20 MG PO TABS: 60.0000 mg | ORAL_TABLET | Freq: Every day | ORAL | 6 refills | Status: DC

## 2019-07-19 MED ORDER — TORSEMIDE 20 MG PO TABS
60.0000 mg | ORAL_TABLET | Freq: Every day | ORAL | Status: DC
  Administered 2019-07-19: 60 mg via ORAL
  Filled 2019-07-19: qty 3

## 2019-07-19 MED ORDER — ISOSORBIDE MONONITRATE ER 60 MG PO TB24
60.0000 mg | ORAL_TABLET | Freq: Every day | ORAL | Status: DC
  Administered 2019-07-19: 60 mg via ORAL
  Filled 2019-07-19: qty 1

## 2019-07-19 MED ORDER — SPIRONOLACTONE 50 MG PO TABS: 50.0000 mg | ORAL_TABLET | Freq: Every day | ORAL | 6 refills | Status: DC

## 2019-07-19 MED ORDER — HYDRALAZINE HCL 50 MG PO TABS
100.0000 mg | ORAL_TABLET | Freq: Three times a day (TID) | ORAL | Status: DC
  Administered 2019-07-19: 100 mg via ORAL
  Filled 2019-07-19: qty 2

## 2019-07-19 MED FILL — ISOSORBIDE MN ER 60 MG TAB: 60 | 30 days supply | Qty: 30 | Fill #0

## 2019-07-19 MED FILL — TORSEMIDE 20 MG TABLET: 20 | 30 days supply | Qty: 90 | Fill #0

## 2019-07-19 MED FILL — SPIRONOLACTONE 50 MG TAB: 50 | 30 days supply | Qty: 30 | Fill #0

## 2019-07-19 MED FILL — hydrALAZINE HCL 100 MG TABS: 100 | 30 days supply | Qty: 90 | Fill #0

## 2019-07-19 NOTE — Progress Notes (Signed)
Patient discharged: Home with family  Via: Wheelchair   Discharge paperwork given: to patient and family  Reviewed with teach back  IV and telemetry disconnected  Belongings given to patient  TOC medication given

## 2019-07-19 NOTE — Progress Notes (Signed)
Patient had 18 beats of VT. Patient asymptomatic. CHF team paged.

## 2019-07-19 NOTE — Progress Notes (Signed)
No new orders. Will continue to monitor.

## 2019-07-19 NOTE — Discharge Summary (Addendum)
Advanced Heart Failure Team  Discharge Summary   Patient ID: Steven Chase Volkman MRN: [id number], DOB/AGE: 01/11/1986 33 y.o. Admit date: 07/15/2019 D/C date:     07/19/2019   Primary Discharge Diagnoses:  1. CP 2.Acute on chronic systolic CHFin setting of hypertensive crisis and medication non-complaince 3.Poorly controlledHTN-> HTN crisis 4.OSA 5. CKD III 6. Obesity 7. Hypokalemia/hypomag 8. NSVT  Hospital Course: Steven Chase Lundahl is Chase 33 y.o. male with chronic NICM(EF 15-20% in 07/2018),poorly controlledHTN, OSA, and obesitywho was admitted with CP and ADHF in setting of uncontrolled HTN/HTN crisis.   Prior to admit he had run out of Chase few of his HF medications. Diuresed with IV lasix + metolazone and later transitioned to torsemide 60 mg daily. Overall weight went down 13 pounds. HF meds adjusted with improvement in blood pressure.  No longer has medical insurance. He was provided HF meds per HF fund. Refer to HF Paramedicine. He does not drive but says his has transportation. He is at high risk for readmits due to poor insight. He was instructed to stay out work until he is seen back in the HF clinic. He will be followed closely in the HF clinic.   See below for detailed problem list.   1. CP - Not ACS. Likely due to ADHF in setting of HTN crisis - Trop minimally elevated and flat - cath with no CAD 4/19  2.Acute on chronic systolic CHFin setting of hypertensive crisis and medication non-complaince - NICM by cath 02/07/18, with low cardiac output. Suspected primarily HTN cardiomyopathy -Echo4/04/2018 LVEF 15%, Grade 2 DD, Trivial AI, Mild MR, Mild LAE, PA peak pressure 38 mm Hg, small pericardial effusion noted. - cMRI4/9/19 LVEF 19%, Normal RV, Discrete nodule in mid inferior wall concerning for possible Sarcoid involvement. No amyloid. - Echo 07/2018 EF 25%. Referred to EP for ICD, but canceled appointment. He is not interested in ICD. - Echo 07/16/19 EF 20%  mild RV HK -Diuresed with IV lasix + metolazone. He was transitioned to torsemide 60 mg daily. .  -  Continue carvedilol 25 bid as tolerated - Continue hydralazine 100 mg three times Chase day + IMdur 60 mg daily.  Cleda Daub- Spiro 50 daily (increased today by Dr Jomarie Longsroituru)  Sherryll Burger- Entresto 97/103 bid - Renal function was followed closely and remained stable.   3.Poorly controlledHTN-> HTN crisis -In setting of med noncompliance.  - Improved with medications.   4.OSA - Sleep study 03/30/18 with severe OSA. AHI 81.4 with sats down to 63% - Following with Dr. Mayford Knifeurner.  -Noncompliant with CPAP. - Discussed importance of CPAP.   5. CKD III -Baseline 1.3-1.5 - Check BMET next week.   6. Obesity Body mass index is 39.12 kg/m.   7. Hypokalemia/hypomag Stable.   8. NSVT - had 18 beats NSVT on the day discharge. He was asymptomatic. He was offered Technical sales engineerLife Vest .- He understands he is at risk for SCD.   Discharge Weight: 242 pounds.  Discharge Vitals: Blood pressure 101/72, pulse 69, temperature 98.6 F (37 C), temperature source Oral, resp. rate 20, height 5\' 6"  (1.676 m), weight 110 kg, SpO2 93 %.  Labs: Lab Results  Component Value Date   WBC 8.0 07/18/2019   HGB 17.8 (H) 07/18/2019   HCT 56.1 (H) 07/18/2019   MCV 84.0 07/18/2019   PLT 242 07/18/2019    Recent Labs  Lab 07/15/19 1729  07/19/19 0932  NA 139   < > 137  K 3.2*   < > 3.7  CL 101   < > 98  CO2 28   < > 27  BUN 29*   < > 18  CREATININE 1.59*   < > 1.43*  CALCIUM 8.7*   < > 9.7  PROT 6.1*  --   --   BILITOT 1.3*  --   --   ALKPHOS 37*  --   --   ALT 22  --   --   AST 28  --   --   GLUCOSE 109*   < > 115*   < > = values in this interval not displayed.   Lab Results  Component Value Date   CHOL 230 (H) 02/06/2018   HDL 37 (L) 02/06/2018   LDLCALC 151 (H) 02/06/2018   TRIG 209 (H) 02/06/2018   BNP (last 3 results) Recent Labs    08/03/18 0911 07/15/19 1927  BNP 74.6 1,024.4*    ProBNP (last 3  results) No results for input(s): PROBNP in the last 8760 hours.   Diagnostic Studies/Procedures   No results found.  Discharge Medications   Allergies as of 07/19/2019   No Known Allergies     Medication List    STOP taking these medications   isosorbide-hydrALAZINE 20-37.5 MG tablet Commonly known as: BiDil   predniSONE 20 MG tablet Commonly known as: Deltasone     TAKE these medications   carvedilol 25 MG tablet Commonly known as: COREG Take 1 tablet (25 mg total) by mouth 2 (two) times daily with Chase meal.   digoxin 0.125 MG tablet Commonly known as: LANOXIN Take 1 tablet (125 mcg total) by mouth daily.   hydrALAZINE 100 MG tablet Commonly known as: APRESOLINE Take 1 tablet (100 mg total) by mouth every 8 (eight) hours.   isosorbide mononitrate 60 MG 24 hr tablet Commonly known as: IMDUR Take 1 tablet (60 mg total) by mouth daily. Start taking on: July 20, 2019   potassium chloride SA 20 MEQ tablet Commonly known as: K-DUR Take 3 tablets (60 mEq total) by mouth daily.   sacubitril-valsartan 97-103 MG Commonly known as: ENTRESTO Take 1 tablet by mouth 2 (two) times daily.   spironolactone 50 MG tablet Commonly known as: ALDACTONE Take 1 tablet (50 mg total) by mouth daily. Start taking on: July 20, 2019 What changed:   medication strength  how much to take   torsemide 20 MG tablet Commonly known as: DEMADEX Take 3 tablets (60 mg total) by mouth daily. Start taking on: July 20, 2019       Disposition   The patient will be discharged in stable condition to home. Discharge Instructions    (HEART FAILURE PATIENTS) Call MD:  Anytime you have any of the following symptoms: 1) 3 pound weight gain in 24 hours or 5 pounds in 1 week 2) shortness of breath, with or without Chase dry hacking cough 3) swelling in the hands, feet or stomach 4) if you have to sleep on extra pillows at night in order to breathe.   Complete by: As directed    Diet -  low sodium heart healthy   Complete by: As directed    Heart Failure patients record your daily weight using the same scale at the same time of day   Complete by: As directed    Increase activity slowly   Complete by: As directed      Follow-up Information     [facility] Follow up on 08/01/2019.   Specialty:  Cardiology Why: 1000 Contact information: [address] [id number] [place] [phone]            Duration of Discharge Encounter: Greater than 35 minutes   Jeanmarie Hubert NP-C  07/19/2019, 2:04 PM  Patient seen and examined with Darrick Grinder, NP. We discussed all aspects of the encounter. I agree with the assessment and plan as stated above.   He is improved today. [place] for d/c with close f/u in HF Clinic. Meds supplied from [facility]. She today's rounding note for further details.  Glori Bickers, MD  1:06 AM

## 2019-07-19 NOTE — Progress Notes (Addendum)
Advanced Heart Failure Rounding Note  PCP-Cardiologist: Mertie Moores, MD   Subjective:   Steven Chase he was switched from IV lasix to po lasix. Weight unchanged at 242 pounds.  Denies SOB. Denies chest pain.     Objective:   Weight Range: 110 kg Body mass index is 39.12 kg/m.   Vital Signs:   Temp:  [97.5 F (36.4 C)-98.1 F (36.7 C)] 97.5 F (36.4 C) (09/16 0437) Pulse Rate:  [73-90] 73 (09/16 0437) Resp:  [16] 16 (09/16 0437) BP: (91-134)/(68-96) 130/96 (09/16 0437) SpO2:  [93 %-98 %] 98 % (09/16 0437) Weight:  [110 kg] 110 kg (09/16 0500) Last BM Date: 07/18/19  Weight change: Filed Weights   07/17/19 0018 07/18/19 0657 07/19/19 0500  Weight: 112.4 kg 109.8 kg 110 kg    Intake/Output:   Intake/Output Summary (Last 24 hours) at 07/19/2019 0911 Last data filed at 07/19/2019 0900 Gross per 24 hour  Intake 1120 ml  Output 1250 ml  Net -130 ml      Physical Exam  General:  Well appearing. No resp difficulty HEENT: normal Neck: supple. Difficult to assess . Carotids 2+ bilat; no bruits. No lymphadenopathy or thryomegaly appreciated. Cor: PMI nondisplaced. Regular rate & rhythm. No rubs, gallops or murmurs. Lungs: clear Abdomen: soft, nontender, distended. No hepatosplenomegaly. No bruits or masses. Good bowel sounds. Extremities: no cyanosis, clubbing, rash, R and LLE trace-1+  edema Neuro: alert & orientedx3, cranial nerves grossly intact. moves all 4 extremities w/o difficulty. Affect pleasant   Telemetry   SR 80s personally 18 beats NSVT reviewed.   EKG    N/A  Labs    CBC Recent Labs    07/17/19 0626 07/18/19 0812  WBC 7.5 8.0  HGB 16.6 17.8*  HCT 52.5* 56.1*  MCV 83.5 84.0  PLT 271 300   Basic Metabolic Panel Recent Labs    07/17/19 0626 07/18/19 0812  NA 140 137  K 3.2* 3.8  CL 100 96*  CO2 27 27  GLUCOSE 100* 134*  BUN 18 18  CREATININE 1.37* 1.56*  CALCIUM 8.7* 9.2  MG 2.2 2.0   Liver Function Tests No results for  input(s): AST, ALT, ALKPHOS, BILITOT, PROT, ALBUMIN in the last 72 hours. No results for input(s): LIPASE, AMYLASE in the last 72 hours. Cardiac Enzymes No results for input(s): CKTOTAL, CKMB, CKMBINDEX, TROPONINI in the last 72 hours.  BNP: BNP (last 3 results) Recent Labs    08/03/18 0911 07/15/19 1927  BNP 74.6 1,024.4*    ProBNP (last 3 results) No results for input(s): PROBNP in the last 8760 hours.   D-Dimer No results for input(s): DDIMER in the last 72 hours. Hemoglobin A1C No results for input(s): HGBA1C in the last 72 hours. Fasting Lipid Panel No results for input(s): CHOL, HDL, LDLCALC, TRIG, CHOLHDL, LDLDIRECT in the last 72 hours. Thyroid Function Tests No results for input(s): TSH, T4TOTAL, T3FREE, THYROIDAB in the last 72 hours.  Invalid input(s): FREET3  Other results:   Imaging    No results found.   Medications:     Scheduled Medications: . carvedilol  25 mg Oral BID WC  . enoxaparin (LOVENOX) injection  40 mg Subcutaneous Daily  . furosemide  80 mg Oral Daily  . isosorbide-hydrALAZINE  2 tablet Oral TID  . potassium chloride  40 mEq Oral TID  . sacubitril-valsartan  1 tablet Oral BID  . sodium chloride flush  3 mL Intravenous Q12H  . spironolactone  50 mg Oral Daily  Infusions: . sodium chloride Stopped (07/16/19 0715)    PRN Medications: sodium chloride, acetaminophen, albuterol, sodium chloride flush   Assessment/Plan   1. CP - Not ACS. Likely due to ADHF in setting of HTN crisis - Trop minimally elevated and flat - cath with no CAD 4/19  2. Acute on chronic systolic CHF in setting of hypertensive crisis and medication non-complaince - NICM by cath 02/07/18, with low cardiac output. Suspected primarily HTN cardiomyopathy -Echo4/04/2018 LVEF 15%, Grade 2 DD, Trivial AI, Mild MR, Mild LAE, PA peak pressure 38 mm Hg, small pericardial effusion noted. - cMRI4/9/19 LVEF 19%, Normal RV, Discrete nodule in mid inferior wall  concerning for possible Sarcoid involvement. No amyloid. - Echo 07/2018 EF 25%. Referred to EP for ICD, but canceled appointment. He is not interested in ICD. - Echo 07/16/19 EF 20% mild RV HK -Volume status mildly elevated. Stop po lasix. Start torsemide 60 mg daily. I dont think twice a day diuretic is good plan. Start torsemide 60 mg daily. Check BMET now. .  - -Continue  ted hose.  -  Continue carvedilol 25 bid as tolerated - Stop BIDIL . Start hydralazine 100 mg three times a day + IMdur 60 mg daily.  Cleda Daub 50 daily (increased today by Dr Jomarie Longs)  Marland Kitchen Sherryll Burger 97/103 bid - Consider SGLT2i - BMET pending.   3. Poorly controlled HTN -> HTN crisis -In setting of med noncompliance.  - Would be careful not to drop SBP < 130 acutely  4. OSA - Sleep study 03/30/18 with severe OSA. AHI 81.4 with sats down to 63% - Following with Dr. Mayford Knife.  - Noncompliant with CPAP. - Discussed importance of CPAP.   5. CKD III - Baseline 1.3-1.5 BMET pending.   6. Obesity Body mass index is 39.12 kg/m.   7. Hypokalemia/hypomag Stable.   8. NSVT - had 18 beats NSVT today. Asymptomatic - discussed LifeVest - he declined  .No longer has medical insurance. Provide HF meds per HF fund. Refer to HF Paramedicine. He does not drive but says his has transportation. He is at high risk for readmits due to poor insight. He wants to return to work but ideally we need to see him back in the clinic before he goes back to work.  Length of Stay: 4  Amy Clegg, NP  07/19/2019, 9:11 AM  Advanced Heart Failure Team Pager [phone] (M-F; 7a - 4p)  Please contact [facility] for night-coverage after hours (4p -7a ) and weekends on [email or web address]  NSVT this afternoon.  Offered Technical sales engineer and discussed the risks. He declined.  D/C today with meds from TOC.  Amy Clegg 2:06 PM   Patient seen and examined with the above-signed Advanced Practice Provider and/or Housestaff. I personally reviewed  laboratory data, imaging studies and relevant notes. I independently examined the patient and formulated the important aspects of the plan. I have edited the note to reflect any of my changes or salient points. I have personally discussed the plan with the patient and/or family.  Feeling much better. Volume status looks good on exam. Meds provided from HF fund. Had 18 beats NSVT today. Asymptomatic. Discussed risk of SCD and role of LifeVest. He refused.   Ok for d/c with close f/u in HF Clinic. Stressed need to comply with meds.   Arvilla Meres, MD  3:25 PM

## 2019-07-21 ENCOUNTER — Telehealth (HOSPITAL_COMMUNITY): Payer: Self-pay | Admitting: Pharmacy Technician

## 2019-07-21 ENCOUNTER — Telehealth (HOSPITAL_COMMUNITY): Payer: Self-pay | Admitting: Surgery

## 2019-07-21 NOTE — Telephone Encounter (Signed)
Patient referred to [facility].  I have sent all appropropriate paperwork via secure email to Paramedic team.

## 2019-07-21 NOTE — Telephone Encounter (Signed)
Patient was approved to receive Entresto through [employer].   ID: [id number]  Effective Dates: 07/21/2019 through 07/20/2020.  [employer] Phone #: [phone]  Spoke with patient and provided him phone number to [employer] and let him know that he can contact them to set up a shipment and for refills.  Charlann Boxer, CPhT

## 2019-07-24 ENCOUNTER — Encounter: Payer: Self-pay | Admitting: Internal Medicine

## 2019-07-24 ENCOUNTER — Encounter (HOSPITAL_COMMUNITY): Payer: Self-pay | Admitting: Licensed Clinical Social Worker

## 2019-07-24 ENCOUNTER — Ambulatory Visit (INDEPENDENT_AMBULATORY_CARE_PROVIDER_SITE_OTHER): Payer: Self-pay | Admitting: Internal Medicine

## 2019-07-24 ENCOUNTER — Telehealth (HOSPITAL_COMMUNITY): Payer: Self-pay | Admitting: Licensed Clinical Social Worker

## 2019-07-24 ENCOUNTER — Other Ambulatory Visit: Payer: Self-pay

## 2019-07-24 VITALS — BP 98/72 | HR 94 | Temp 98.7°F | Ht 66.0 in | Wt 239.0 lb

## 2019-07-24 DIAGNOSIS — I10 Essential (primary) hypertension: Secondary | ICD-10-CM

## 2019-07-24 DIAGNOSIS — R739 Hyperglycemia, unspecified: Secondary | ICD-10-CM

## 2019-07-24 DIAGNOSIS — I5022 Chronic systolic (congestive) heart failure: Secondary | ICD-10-CM

## 2019-07-24 NOTE — Patient Instructions (Signed)
Please continue all other medications as before, and refills have been done if requested.  Please have the pharmacy call with any other refills you may need.  Please continue your efforts at being more active, low cholesterol diet, and weight control.  You are otherwise up to date with prevention measures today.  Please keep your appointments with your specialists as you may have planned - Sept 29 Heart Failure clinic  You are given the work note today

## 2019-07-24 NOTE — Assessment & Plan Note (Signed)
stable overall by history and exam, recent data reviewed with pt, and pt to continue medical treatment as before,  to f/u any worsening symptoms or concerns  

## 2019-07-24 NOTE — Progress Notes (Signed)
Subjective:    Patient ID: Steven Chase, male    DOB: 1985/11/12, 33 y.o.   MRN: [id number]  HPI  Here after recent hospn 9/12-9/16 with CHF, HTN. OSA and non compliance. States taking all meds.  Pt denies chest pain, increased sob or doe, wheezing, orthopnea, PND, increased LE swelling, palpitations, dizziness or syncope.  Pt denies new neurological symptoms such as new headache, or facial or extremity weakness or numbness   Pt denies polydipsia, polyuria.  Scheduled for f/u appt sept 29 to HF clinic.  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 Past Medical History:  Diagnosis Date  . Hypertension   . Obesity   . OSA (obstructive sleep apnea) 11/05/2014   02/2015 -severe -AHI 106/h    Past Surgical History:  Procedure Laterality Date  . HERNIA REPAIR    . RIGHT/LEFT HEART CATH AND CORONARY ANGIOGRAPHY N/A 02/07/2018   Procedure: RIGHT/LEFT HEART CATH AND CORONARY ANGIOGRAPHY;  Surgeon: Marykay Lex, MD;  Location: [facility] INVASIVE CV LAB;  Service: Cardiovascular;  Laterality: N/A;    reports that he has never smoked. He has never used smokeless tobacco. He reports that he does not drink alcohol or use drugs. family history includes Heart disease in his maternal grandfather; Hypertension in his maternal grandfather, mother, and sister. No Known Allergies Current Outpatient Medications on File Prior to Visit  Medication Sig Dispense Refill  . carvedilol (COREG) 25 MG tablet Take 1 tablet (25 mg total) by mouth 2 (two) times daily with a meal. 60 tablet 6  . digoxin (LANOXIN) 0.125 MG tablet Take 1 tablet (125 mcg total) by mouth daily. 30 tablet 3  . hydrALAZINE (APRESOLINE) 100 MG tablet Take 1 tablet (100 mg total) by mouth every 8 (eight) hours. 90 tablet 6  . isosorbide mononitrate (IMDUR) 60 MG 24 hr tablet Take 1 tablet (60 mg total) by mouth daily. 30 tablet 6  . potassium chloride SA (K-DUR,KLOR-CON) 20 MEQ tablet Take 3 tablets (60 mEq total) by mouth daily. 60 tablet 3   . sacubitril-valsartan (ENTRESTO) 97-103 MG Take 1 tablet by mouth 2 (two) times daily. 60 tablet 6  . spironolactone (ALDACTONE) 50 MG tablet Take 1 tablet (50 mg total) by mouth daily. 60 tablet 6  . torsemide (DEMADEX) 20 MG tablet Take 3 tablets (60 mg total) by mouth daily. 90 tablet 6   No current facility-administered medications on file prior to visit.    Review of Systems  Constitutional: Negative for other unusual diaphoresis or sweats HENT: Negative for ear discharge or swelling Eyes: Negative for other worsening visual disturbances Respiratory: Negative for stridor or other swelling  Gastrointestinal: Negative for worsening distension or other blood Genitourinary: Negative for retention or other urinary change Musculoskeletal: Negative for other MSK pain or swelling Skin: Negative for color change or other new lesions Neurological: Negative for worsening tremors and other numbness  Psychiatric/Behavioral: Negative for worsening agitation or other fatigue All other system neg per pt     Objective:   Physical Exam BP 98/72 (BP Location: Left Arm, Patient Position: Sitting, Cuff Size: Large)   Pulse 94   Temp 98.7 F (37.1 C) (Oral)   Ht 5\' 6"  (1.676 m)   Wt 239 lb (108.4 kg)   SpO2 95%   BMI 38.58 kg/m  VS noted,  Constitutional: Pt appears in NAD HENT: Head: NCAT.  Right Ear: External ear normal.  Left Ear: External ear normal.  Eyes: . Pupils are equal, round,  and reactive to light. Conjunctivae and EOM are normal Nose: without d/c or deformity Neck: Neck supple. Gross normal ROM Cardiovascular: Normal rate and regular rhythm.   Pulmonary/Chest: Effort normal and breath sounds without rales or wheezing.  Abd:  Soft, NT, ND, + BS, no organomegaly Neurological: Pt is alert. At baseline orientation, motor grossly intact Skin: Skin is warm. No rashes, other new lesions, no LE edema Psychiatric: Pt behavior is normal without agitation  All otherwise neg per pt Lab  Results  Component Value Date   WBC 8.0 07/18/2019   HGB 17.8 (H) 07/18/2019   HCT 56.1 (H) 07/18/2019   PLT 242 07/18/2019   GLUCOSE 115 (H) 07/19/2019   CHOL 230 (H) 02/06/2018   TRIG 209 (H) 02/06/2018   HDL 37 (L) 02/06/2018   LDLCALC 151 (H) 02/06/2018   ALT 22 07/15/2019   AST 28 07/15/2019   NA 137 07/19/2019   K 3.7 07/19/2019   CL 98 07/19/2019   CREATININE 1.43 (H) 07/19/2019   BUN 18 07/19/2019   CO2 27 07/19/2019   TSH 1.953 02/06/2018   INR 1.24 02/06/2018   HGBA1C 6.0 (H) 07/19/2019   MICROALBUR 0.42 08/24/2008         Assessment & Plan:

## 2019-07-24 NOTE — Telephone Encounter (Signed)
Paramedicine Initial Assessment:  Housing:  In what kind of housing do you live? House/apt/trailer/shelter? Apt  Do you rent/pay a mortgage/own? rent  Do you live with anyone? His brother is living with him currently.  Are you currently worried about losing your housing? no  Within the past 12 months have you ever stayed outside, in a car, tent, a shelter, or temporarily with someone? no  Within the past 12 months have you been unable to get utilities when it was really needed? no  Social:  What is your current marital status? single  Do you have any children? daughter  Do you have family or friends who live locally? Sister, brother, and mom  Food:  Within the past 12 months were you ever worried that food would run out before you got money to buy more? sometimes  Within the past 5months have you run out of food and didn't have money to buy more? no  Income:  What is your current source of income? Working parttime  How hard is it for you to pay for the basics like food housing, medical care, and utilities? Not very hard  Do you have outstanding medical bills? yes  Insurance:  Are you currently insured? No- coverage was stopped due to non-payment- states he should be able to contact them and get it restarted.  Do you have prescription coverage? no  If no insurance, have you applied for coverage (Medicaid, disability, marketplace etc)? No- CSW mailing cone assistance application  Transportation:  Do you have transportation to your medical appointments? Gets a ride from family members or orders a lyft  In the past 12 months has lack of transportation kept you from medical appts or from getting medications? no  In the past 12 months has lack of transportation kept you from meetings, work, or getting things you needed? no   Daily Health Needs: Do you have a working scale at home? yes  How do you manage your medications at home? Take them out of the bottle  Do you ever  take your medications differently than prescribed? no  Do you have issues affording your medications? Was having trouble with entresto but just approved for assistance. Gets the rest of his medications at [facility] and usually affordable  If yes, has this ever prevented you from obtaining medications? no  Do you have any concerns with mobility at home? no   Do you use any assistive devices at home or have PCS at home? no   Are there any additional barriers you see to getting the care you need? no  CSW will continue to follow through paramedicine program and assist as needed.  Jorge Ny, LCSW Clinical Social Worker Advanced Heart Failure Clinic Desk#: [phone] Cell#: [phone]

## 2019-07-25 ENCOUNTER — Telehealth (HOSPITAL_COMMUNITY): Payer: Self-pay

## 2019-07-25 ENCOUNTER — Telehealth (HOSPITAL_COMMUNITY): Payer: Self-pay | Admitting: *Deleted

## 2019-07-25 NOTE — Telephone Encounter (Signed)
I called Steven Chase to schedule our initial appointment. He stated he was available to meet on Thursday so we agreed to meet at 11:00.

## 2019-07-25 NOTE — Telephone Encounter (Signed)
Pt called wanting a letter to return to work. Patient has an office visit schedule 9/29 but said he needs to be cleared before then. 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

## 2019-07-25 NOTE — Telephone Encounter (Signed)
Ok. As long as we can specify light duty. That ok with him?

## 2019-07-26 ENCOUNTER — Encounter (HOSPITAL_COMMUNITY): Payer: Self-pay

## 2019-07-26 NOTE — Telephone Encounter (Signed)
Called pt and eft VM requesting he return call. If pt is ok that we specify light duty letter to return to work with restrictions can be written for Dr.Bensimhon to sign

## 2019-07-26 NOTE — Progress Notes (Signed)
Spoke with [employer]. Pt cleared for work but has the restrictions of "no heavy lifting". Letter created and is to be signed by Dr. Haroldine Laws and picked up by [facility] [id number].

## 2019-07-27 ENCOUNTER — Other Ambulatory Visit (HOSPITAL_COMMUNITY): Payer: Self-pay

## 2019-07-27 MED ORDER — DIGOXIN 125 MCG PO TABS: 125.0000 ug | ORAL_TABLET | Freq: Every day | ORAL | 3 refills | Status: DC

## 2019-07-27 MED ORDER — POTASSIUM CHLORIDE CRYS ER 20 MEQ PO TBCR: 60.0000 meq | EXTENDED_RELEASE_TABLET | Freq: Every day | ORAL | 3 refills | Status: DC

## 2019-07-27 MED FILL — DIGOXIN 0.125 MG TABLET: 125 | 30 days supply | Qty: 30 | Fill #0

## 2019-07-27 MED FILL — POTASSIUM CHLORIDE CRYS ER: 20 | 20 days supply | Qty: 60 | Fill #0

## 2019-07-27 NOTE — Progress Notes (Signed)
Paramedicine Encounter    Patient ID: Steven Chase, male    DOB: 1986-07-22, 33 y.o.   MRN: [id number]   Patient Care Team: Steven Levins, MD as PCP - General (Internal Medicine) Nahser, Deloris Ping, MD as PCP - Cardiology (Cardiology) Steven Sis, LCSW as Social Worker (Licensed Clinical Social Worker)  Patient Active Problem List   Diagnosis Date Noted  . Acute exacerbation of CHF (congestive heart failure) (HCC) 07/15/2019  . Lipodystrophy 10/21/2018  . Obesity (BMI 30-39.9) 10/06/2018  . Chronic systolic heart failure (HCC) 02/17/2018  . Fatigue 02/17/2018  . Cardiomyopathy (HCC) 02/11/2018  . Hypertensive crisis 02/04/2018  . Chest pain 02/04/2018  . CKD (chronic kidney disease), stage II 02/04/2018  . Allergic rhinitis 01/21/2018  . Hyperglycemia 12/02/2017  . Hypokalemia 12/02/2017  . Cough 12/02/2017  . Patellar tendonitis of left knee 12/02/2017  . Preventative health care 12/10/2016  . Obesity 05/25/2016  . Lower leg edema 12/12/2014  . Essential hypertension 11/05/2014  . OSA (obstructive sleep apnea) 11/05/2014    Current Outpatient Medications:  .  carvedilol (COREG) 25 MG tablet, Take 1 tablet (25 mg total) by mouth 2 (two) times daily with a meal., Disp: 60 tablet, Rfl: 6 .  hydrALAZINE (APRESOLINE) 100 MG tablet, Take 1 tablet (100 mg total) by mouth every 8 (eight) hours., Disp: 90 tablet, Rfl: 6 .  isosorbide mononitrate (IMDUR) 60 MG 24 hr tablet, Take 1 tablet (60 mg total) by mouth daily., Disp: 30 tablet, Rfl: 6 .  potassium chloride SA (K-DUR,KLOR-CON) 20 MEQ tablet, Take 3 tablets (60 mEq total) by mouth daily., Disp: 60 tablet, Rfl: 3 .  sacubitril-valsartan (ENTRESTO) 97-103 MG, Take 1 tablet by mouth 2 (two) times daily., Disp: 60 tablet, Rfl: 6 .  spironolactone (ALDACTONE) 50 MG tablet, Take 1 tablet (50 mg total) by mouth daily., Disp: 60 tablet, Rfl: 6 .  torsemide (DEMADEX) 20 MG tablet, Take 3 tablets (60 mg total) by mouth daily., Disp: 90  tablet, Rfl: 6 .  digoxin (LANOXIN) 0.125 MG tablet, Take 1 tablet (125 mcg total) by mouth daily. (Patient not taking: Reported on 07/27/2019), Disp: 30 tablet, Rfl: 3 No Known Allergies    Social History   Socioeconomic History  . Marital status: Single    Spouse name: Not on file  . Number of children: 1  . Years of education: 97  . Highest education level: Not on file  Occupational History  . Occupation: Therapist, music: [employer]  Social Needs  . Financial resource strain: Not very hard  . Food insecurity    Worry: Sometimes true    Inability: Sometimes true  . Transportation needs    Medical: No    Non-medical: No  Tobacco Use  . Smoking status: Never Smoker  . Smokeless tobacco: Never Used  Substance and Sexual Activity  . Alcohol use: No  . Drug use: No  . Sexual activity: Not on file  Lifestyle  . Physical activity    Days per week: Not on file    Minutes per session: Not on file  . Stress: Not on file  Relationships  . Social Musician on phone: Not on file    Gets together: Not on file    Attends religious service: Not on file    Active member of club or organization: Not on file    Attends meetings of clubs or organizations: Not on file    Relationship status:  Not on file  . Intimate partner violence    Fear of current or ex partner: Not on file    Emotionally abused: Not on file    Physically abused: Not on file    Forced sexual activity: Not on file  Other Topics Concern  . Not on file  Social History Narrative   Born and raised in [place], [place]. Currently live in a private residence with his mother. Fun: Play with his daughter.   Denies religious beliefs that would effect health care.     Physical Exam Cardiovascular:     Rate and Rhythm: Normal rate and regular rhythm.     Pulses: Normal pulses.  Pulmonary:     Effort: Pulmonary effort is normal.     Breath sounds: Normal breath sounds.  Abdominal:     General:  There is no distension.  Musculoskeletal: Normal range of motion.     Right lower leg: No edema.     Left lower leg: No edema.  Skin:    General: Skin is warm and dry.     Capillary Refill: Capillary refill takes less than 2 seconds.  Neurological:     Mental Status: He is alert and oriented to person, place, and time.  Psychiatric:        Mood and Affect: Mood normal.         Future Appointments  Date Time Provider [place]  08/01/2019 10:00 AM [facility] PA/NP [facility] None  10/11/2019  3:20 PM Steven Borg, MD [facility]    BP 100/78 (BP Location: Left Arm, Patient Position: Sitting, Cuff Size: Normal)   Pulse 88   Resp 16   Wt 234 lb (106.1 kg)   SpO2 95%   BMI 37.77 kg/m   Weight yesterday- 234.6 lb Last visit weight- N/A  [name] was seen at home today for our initial visit. He stated he has been feeling well since discharge from the hospital, denying chest pain, SOB, headache, dizziness, orthopnea, fever or cough. He reported being compliant with his medications but he did not have digoxin. He stated his insurance had lapsed and he did not have any refills. I contacted the HF clinic and had a new prescription of digoxin and potassium sent to the outpatient pharmacy under the HF fund. I will pick this up and deliver it to him tomorrow. He stated he will have his insurance reinstated by the first of October so at that point he will no longer need the HF fund. He also advised that he had been taking the 49/51 mg dose of Entresto rather than doubling it to 97/103 mg as directed upon discharge. Luckily he has kept a detailed log of his blood pressures for each day since discharge and I noted that his systolic pressures have been trending in the 90's and 100's. I contacted the clinic to see if he should increase to the 97/103 mg dose and was told to have him maintain at 49/51 mg for now. I will deliver his medications from the pharmacy tomorrow.  Steven Chase,  EMT 07/27/19  ACTION: Home visit completed Next visit planned for Tomorrow to deliver digoxin

## 2019-07-28 ENCOUNTER — Telehealth (HOSPITAL_COMMUNITY): Payer: Self-pay

## 2019-07-28 NOTE — Telephone Encounter (Signed)
Steven Chase was seen at home to deliver digoxin and potassium from the outpatient pharmacy. I explained when he should take this medication and he expressed understanding. I will follow up next week.

## 2019-08-01 ENCOUNTER — Telehealth (HOSPITAL_COMMUNITY): Payer: Self-pay

## 2019-08-01 ENCOUNTER — Ambulatory Visit (HOSPITAL_COMMUNITY)
Admission: RE | Admit: 2019-08-01 | Discharge: 2019-08-01 | Disposition: A | Discharge status: Home / Self Care | Payer: Self-pay | Source: Ambulatory Visit | Attending: Cardiology | Admitting: Cardiology

## 2019-08-01 ENCOUNTER — Encounter (HOSPITAL_COMMUNITY): Payer: Self-pay

## 2019-08-01 ENCOUNTER — Other Ambulatory Visit (HOSPITAL_COMMUNITY): Payer: Self-pay

## 2019-08-01 ENCOUNTER — Other Ambulatory Visit: Payer: Self-pay

## 2019-08-01 VITALS — BP 114/92 | HR 89 | Wt 237.0 lb

## 2019-08-01 DIAGNOSIS — N183 Chronic kidney disease, stage 3 (moderate): Secondary | ICD-10-CM | POA: Insufficient documentation

## 2019-08-01 DIAGNOSIS — I428 Other cardiomyopathies: Secondary | ICD-10-CM | POA: Insufficient documentation

## 2019-08-01 DIAGNOSIS — Z7901 Long term (current) use of anticoagulants: Secondary | ICD-10-CM | POA: Insufficient documentation

## 2019-08-01 DIAGNOSIS — I429 Cardiomyopathy, unspecified: Secondary | ICD-10-CM

## 2019-08-01 DIAGNOSIS — I5022 Chronic systolic (congestive) heart failure: Secondary | ICD-10-CM

## 2019-08-01 DIAGNOSIS — I169 Hypertensive crisis, unspecified: Secondary | ICD-10-CM | POA: Insufficient documentation

## 2019-08-01 DIAGNOSIS — I10 Essential (primary) hypertension: Secondary | ICD-10-CM

## 2019-08-01 DIAGNOSIS — Z6839 Body mass index (BMI) 39.0-39.9, adult: Secondary | ICD-10-CM | POA: Insufficient documentation

## 2019-08-01 DIAGNOSIS — E669 Obesity, unspecified: Secondary | ICD-10-CM | POA: Insufficient documentation

## 2019-08-01 DIAGNOSIS — Z8249 Family history of ischemic heart disease and other diseases of the circulatory system: Secondary | ICD-10-CM | POA: Insufficient documentation

## 2019-08-01 DIAGNOSIS — N182 Chronic kidney disease, stage 2 (mild): Secondary | ICD-10-CM

## 2019-08-01 DIAGNOSIS — G4733 Obstructive sleep apnea (adult) (pediatric): Secondary | ICD-10-CM

## 2019-08-01 DIAGNOSIS — Z79899 Other long term (current) drug therapy: Secondary | ICD-10-CM | POA: Insufficient documentation

## 2019-08-01 DIAGNOSIS — I130 Hypertensive heart and chronic kidney disease with heart failure and stage 1 through stage 4 chronic kidney disease, or unspecified chronic kidney disease: Secondary | ICD-10-CM | POA: Insufficient documentation

## 2019-08-01 LAB — BASIC METABOLIC PANEL
Anion gap: 12 (ref 5–15)
BUN: 45 mg/dL — ABNORMAL HIGH (ref 6–20)
CO2: 22 mmol/L (ref 22–32)
Calcium: 10 mg/dL (ref 8.9–10.3)
Chloride: 102 mmol/L (ref 98–111)
Creatinine, Ser: 1.93 mg/dL — ABNORMAL HIGH (ref 0.61–1.24)
GFR calc Af Amer: 52 mL/min — ABNORMAL LOW (ref >60)
GFR calc non Af Amer: 44 mL/min — ABNORMAL LOW (ref >60)
Glucose, Bld: 111 mg/dL — ABNORMAL HIGH (ref 70–99)
Potassium: 4.7 mmol/L (ref 3.5–5.1)
Sodium: 136 mmol/L (ref 135–145)

## 2019-08-01 MED ORDER — TORSEMIDE 20 MG PO TABS: 40.0000 mg | ORAL_TABLET | Freq: Every day | ORAL | 6 refills | Status: DC

## 2019-08-01 NOTE — Telephone Encounter (Signed)
Called the pt to review lab work and medication changes. Pt verbalized understanding but did state that he doesn't know which pill in his pill box are the torsemide. Made pt aware that [place] was contacted on his behalf. Lab appt made. No further questions at this time.

## 2019-08-01 NOTE — Progress Notes (Signed)
PCP: Dr Jonny Ruiz  Primary Cardiologist:Dr Nahser HF MD: Dr Gala Romney   HPI: Steven Chase is a 33 y.o. male with chronic NICM(EF 15-20% in 07/2018),poorly controlledHTN, OSA, and obesity  Admitted with CP and ADHF in setting of uncontrolled HTN/HTN crisis. Prior to admit he had run out of a few of his HF medications. Diuresed with IV lasix + metolazone and later transitioned to torsemide 60 mg daily. Overall weight went down 13 pounds. HF meds adjusted with improvement in blood pressure. Entresto increased to 97-103 twice a day. Discharge weight was 242 pounds.   Today he returns for post hospital HF follow up. Overall feeling fine. Denies SOB/PND/Orthopnea. Using CPAP.  Appetite ok. No fever or chills. Weight at home has been 234-236  pounds. Taking all medications. He never increased entresto, he continued 49-51 twice a day. Followed by HF Paramedicine.   ROS: All systems negative except as listed in HPI, PMH and Problem List.  SH:  Social History   Socioeconomic History  . Marital status: Single    Spouse name: Not on file  . Number of children: 1  . Years of education: 44  . Highest education level: Not on file  Occupational History  . Occupation: Therapist, music: [employer]  Social Needs  . Financial resource strain: Not very hard  . Food insecurity    Worry: Sometimes true    Inability: Sometimes true  . Transportation needs    Medical: No    Non-medical: No  Tobacco Use  . Smoking status: Never Smoker  . Smokeless tobacco: Never Used  Substance and Sexual Activity  . Alcohol use: No  . Drug use: No  . Sexual activity: Not on file  Lifestyle  . Physical activity    Days per week: Not on file    Minutes per session: Not on file  . Stress: Not on file  Relationships  . Social Musician on phone: Not on file    Gets together: Not on file    Attends religious service: Not on file    Active member of club or organization: Not on file   Attends meetings of clubs or organizations: Not on file    Relationship status: Not on file  . Intimate partner violence    Fear of current or ex partner: Not on file    Emotionally abused: Not on file    Physically abused: Not on file    Forced sexual activity: Not on file  Other Topics Concern  . Not on file  Social History Narrative   Born and raised in [place], [place]. Currently live in a private residence with his mother. Fun: Play with his daughter.   Denies religious beliefs that would effect health care.     FH:  Family History  Problem Relation Age of Onset  . Hypertension Mother   . Heart disease Maternal Grandfather   . Hypertension Maternal Grandfather   . Hypertension Sister     Past Medical History:  Diagnosis Date  . Hypertension   . Obesity   . OSA (obstructive sleep apnea) 11/05/2014   02/2015 -severe -AHI 106/h     Current Outpatient Medications  Medication Sig Dispense Refill  . carvedilol (COREG) 25 MG tablet Take 1 tablet (25 mg total) by mouth 2 (two) times daily with a meal. 60 tablet 6  . digoxin (LANOXIN) 0.125 MG tablet Take 1 tablet (125 mcg total) by mouth daily. 30 tablet 3  .  hydrALAZINE (APRESOLINE) 100 MG tablet Take 1 tablet (100 mg total) by mouth every 8 (eight) hours. 90 tablet 6  . isosorbide mononitrate (IMDUR) 60 MG 24 hr tablet Take 1 tablet (60 mg total) by mouth daily. 30 tablet 6  . potassium chloride SA (K-DUR) 20 MEQ tablet Take 3 tablets (60 mEq total) by mouth daily. 60 tablet 3  . sacubitril-valsartan (ENTRESTO) 49-51 MG Take 1 tablet by mouth 2 (two) times daily.    Marland Kitchen spironolactone (ALDACTONE) 50 MG tablet Take 1 tablet (50 mg total) by mouth daily. 60 tablet 6  . torsemide (DEMADEX) 20 MG tablet Take 3 tablets (60 mg total) by mouth daily. 90 tablet 6   No current facility-administered medications for this encounter.     Vitals:   08/01/19 0948  BP: (!) 114/92  Pulse: 89  SpO2: 93%  Weight: 107.5 kg (237 lb)   Wt  Readings from Last 3 Encounters:  08/01/19 107.5 kg (237 lb)  07/27/19 106.1 kg (234 lb)  07/24/19 108.4 kg (239 lb)     PHYSICAL EXAM: General:  Well appearing. No resp difficulty HEENT: normal Neck: supple. JVP flat. Carotids 2+ bilaterally; no bruits. No lymphadenopathy or thryomegaly appreciated. Cor: PMI normal. Regular rate & rhythm. No rubs, gallops or murmurs. Lungs: clear Abdomen: soft, nontender, nondistended. No hepatosplenomegaly. No bruits or masses. Good bowel sounds. Extremities: no cyanosis, clubbing, rash, edema Neuro: alert & orientedx3, cranial nerves grossly intact. Moves all 4 extremities w/o difficulty. Affect pleasant.      ASSESSMENT & PLAN: 1.Chronic systolic [facility] setting of hypertensive crisis and medication non-complaince - NICM by cath 02/07/18, with low cardiac output. Suspected primarily HTN cardiomyopathy -Echo4/04/2018 LVEF 15%, Grade 2 DD, Trivial AI, Mild MR, Mild LAE, PA peak pressure 38 mm Hg, small pericardial effusion noted. - cMRI4/9/19 LVEF 19%, Normal RV, Discrete nodule in mid inferior wall concerning for possible Sarcoid involvement. No amyloid. - Echo 07/2018 EF 25%. Referred to EP for ICD, but canceled appointment. He is not interested in ICD. - Echo 07/16/19 EF 20% mild RV HK -NYHA II. Volume status stable. Continue torsemide 60 mg daily.  - Continue carvedilol 25 bid. -Continue hydralazine 100 mg three times a day + IMdur 60 mg daily. - Spiro 50 daily  - Continue entresto 49-51 mg twice a day.  - Check BMET today.   3.HTN  Stable.   4.OSA - Sleep study 03/30/18 with severe OSA. AHI 81.4 with sats down to 63% - Continue CPAP nightly    5. CKD III -Baseline 1.3-1.5 - Check BMET next week.   6. Obesity Body mass index is 39.12 kg/m.  Check BMET today. Follow up in 4 weeks. Plan to check ECHO in December.   Amy Clegg NP-C  11:04 AM

## 2019-08-01 NOTE — Progress Notes (Signed)
Paramedicine Encounter   Patient ID: Lucilla Edin , male,   DOB: Aug 15, 1986,33 y.o.,  MRN: [id number]  Mr Kernen was seen in the clinic with Darrick Grinder, NP, today. He reported feeling well and reported being compliant with his medications. No changes were made at this time but he is having labs drawn for kidney function. He forgot to bring his medications and pillbox so I will follow up at his house this afternoon to refill his pillbox.   Jacquiline Doe, EMT 08/01/2019   ACTION: Next visit planned for later today for med refill

## 2019-08-01 NOTE — Telephone Encounter (Signed)
-----   Message from Conrad [place], NP sent at 08/01/2019 11:18 AM EDT ----- Hold torsemide for x 2 days the cut torsemide back to 40 mg daily. Repeat BMET in 7 days

## 2019-08-01 NOTE — Patient Instructions (Signed)
Lab work done today. We will notify you of any abnormal lab work. No news is good news!  Please follow up with the [facility] in 3 weeks.   At the [facility], you and your health needs are our priority. As part of our continuing mission to provide you with exceptional heart care, we have created designated Provider Care Teams. These Care Teams include your primary Cardiologist (physician) and Advanced Practice Providers (APPs- Physician Assistants and Nurse Practitioners) who all work together to provide you with the care you need, when you need it.   You may see any of the following providers on your designated Care Team at your next follow up: Marland Kitchen Dr Glori Bickers . Dr Loralie Champagne . Darrick Grinder, NP   Please be sure to bring in all your medications bottles to every appointment.

## 2019-08-01 NOTE — Progress Notes (Signed)
CSW met with pt in clinic to check in as patient is new paramedicine.  Pt reports that he is doing ok and has had his first visit with the paramedicine program- thinks it will be helpful to him especially in obtaining medications and taking them correctly.  Pt reports no needs at this time- CSW will continue to follow and assist as needed  Jenna H. Uris, LCSW Clinical Social Worker [facility] Desk#: [phone] Cell#: [phone]   

## 2019-08-01 NOTE — Progress Notes (Signed)
I saw Steven Chase at home today following his appointment to make the necessary changes to his pillbox. He had not brought his pillbox to his appointment so I wanted to verify he had properly filled it. Upon checking, he had places spironolactone in his morning and evening slot and only places carvedilol in his evening slot. Additionally, there was not consistency of his torsemide dosing. I know last weeks medications were administered correctly because I filled his box so today I brought the mistakes to his attention and corrected them. Additionally, I removed torsemide from his pillbox for the next two days and then placed 40 mg/day in his box through next week. Finally, I contacted the specialty pharmacy who advised they did not have a prescription acknowledging the entresto dosing change. I will follow up with the clinic tomorrow to see that this is sent in. I made Steven Chase aware of this and he was understanding and agreeable.   Jacquiline Doe, EMT 08/01/19

## 2019-08-02 ENCOUNTER — Telehealth (HOSPITAL_COMMUNITY): Payer: Self-pay | Admitting: Licensed Clinical Social Worker

## 2019-08-02 NOTE — Telephone Encounter (Signed)
CSW informed by community paramedic that pt is short $395 on rent this month and it is due Friday.  Pt states this is the first time he has been short on rent and its because of his inability to work while he was sick- was just cleared to return to work so does not anticipate this being a problem in the future.  CSW discussed options with patient- explained that most community resources require that pt be on a last notice before they will supply emergency assistance so likely local options would not be available.  Encouraged patient to reach out to his landlord and explain the situation to see if they would be willing to accept partial payment for this month and slightly more payment for subsequent months until it is paid off. Pt spoke to landlord and they are willing to have patient pay what he can for this month since he was unable to work.  CSW encouraged patient to reach out if landlord changed her mind or if he had concerns paying for medications or food because he had to divert more funds to rent.  CSW will continue to follow and assist as needed  Jorge Ny, [facility] Desk#: [phone] Cell#: [phone]

## 2019-08-07 ENCOUNTER — Telehealth (HOSPITAL_COMMUNITY): Payer: Self-pay | Admitting: Licensed Clinical Social Worker

## 2019-08-07 NOTE — Telephone Encounter (Signed)
CSW called pt to check in regarding rent.  Pt confirms he is still ok with his landlord to pay partial rent upfront this month and get back to her on the rest- reports no other concerns at this time.  Pt did inquire who from paramedicine would be seeing him this week as [name], his primary [employer], is out this week- CSW sent message to other paramedics to inquire.  CSW also confirmed pt lab appt with patient for Wednesday- he reports no conflicts with coming  CSW will continue to follow and assist as needed  Steven Chase, [facility] Desk#: [phone] Cell#: [phone]

## 2019-08-09 ENCOUNTER — Other Ambulatory Visit (HOSPITAL_COMMUNITY): Payer: Self-pay

## 2019-08-10 ENCOUNTER — Telehealth (HOSPITAL_COMMUNITY): Payer: Self-pay | Admitting: Cardiology

## 2019-08-10 ENCOUNTER — Other Ambulatory Visit (HOSPITAL_COMMUNITY): Payer: Self-pay

## 2019-08-10 MED ORDER — ENTRESTO 97-103 MG PO TABS: 1.0000 | ORAL_TABLET | Freq: Two times a day (BID) | ORAL | Status: DC

## 2019-08-10 MED ORDER — HYDRALAZINE HCL 100 MG PO TABS: 100.0000 mg | ORAL_TABLET | Freq: Three times a day (TID) | ORAL | 2 refills | Status: DC

## 2019-08-10 MED ORDER — TORSEMIDE 20 MG PO TABS: 40.0000 mg | ORAL_TABLET | Freq: Every day | ORAL | 2 refills | Status: DC

## 2019-08-10 MED ORDER — ISOSORBIDE MONONITRATE ER 60 MG PO TB24: 60.0000 mg | ORAL_TABLET | Freq: Every day | ORAL | 2 refills | Status: DC

## 2019-08-10 MED ORDER — SPIRONOLACTONE 50 MG PO TABS: 50.0000 mg | ORAL_TABLET | Freq: Every day | ORAL | 2 refills | Status: DC

## 2019-08-10 MED ORDER — CARVEDILOL 25 MG PO TABS: 25.0000 mg | ORAL_TABLET | Freq: Two times a day (BID) | ORAL | 2 refills | Status: DC

## 2019-08-10 MED FILL — ISOSORBIDE MN ER 60 MG TAB: 60 | 30 days supply | Qty: 30 | Fill #0

## 2019-08-10 MED FILL — SPIRONOLACTONE 50 MG TABLET: 50 | 30 days supply | Qty: 30 | Fill #0

## 2019-08-10 MED FILL — CARVEDILOL 25 MG TABLET: 25 | 30 days supply | Qty: 60 | Fill #0

## 2019-08-10 MED FILL — hydrALAZINE HCL 100 MG TABS: 100 | 30 days supply | Qty: 90 | Fill #0

## 2019-08-10 NOTE — Progress Notes (Signed)
Paramedicine Encounter    Patient ID: Steven Chase, male    DOB: 12-25-1985, 33 y.o.   MRN: [id number]   Patient Care Team: Corwin Levins, MD as PCP - General (Internal Medicine) Nahser, Deloris Ping, MD as PCP - Cardiology (Cardiology) Burna Sis, LCSW as Social Worker (Licensed Clinical Social Worker)  Patient Active Problem List   Diagnosis Date Noted  . Acute exacerbation of CHF (congestive heart failure) (HCC) 07/15/2019  . Lipodystrophy 10/21/2018  . Obesity (BMI 30-39.9) 10/06/2018  . Chronic systolic heart failure (HCC) 02/17/2018  . Fatigue 02/17/2018  . Cardiomyopathy (HCC) 02/11/2018  . Hypertensive crisis 02/04/2018  . Chest pain 02/04/2018  . CKD (chronic kidney disease), stage II 02/04/2018  . Allergic rhinitis 01/21/2018  . Hyperglycemia 12/02/2017  . Hypokalemia 12/02/2017  . Cough 12/02/2017  . Patellar tendonitis of left knee 12/02/2017  . Preventative health care 12/10/2016  . Obesity 05/25/2016  . Lower leg edema 12/12/2014  . Essential hypertension 11/05/2014  . OSA (obstructive sleep apnea) 11/05/2014    Current Outpatient Medications:  .  carvedilol (COREG) 25 MG tablet, Take 1 tablet (25 mg total) by mouth 2 (two) times daily with a meal., Disp: 60 tablet, Rfl: 2 .  digoxin (LANOXIN) 0.125 MG tablet, Take 1 tablet (125 mcg total) by mouth daily., Disp: 30 tablet, Rfl: 3 .  hydrALAZINE (APRESOLINE) 100 MG tablet, Take 1 tablet (100 mg total) by mouth every 8 (eight) hours., Disp: 90 tablet, Rfl: 2 .  isosorbide mononitrate (IMDUR) 60 MG 24 hr tablet, Take 1 tablet (60 mg total) by mouth daily., Disp: 30 tablet, Rfl: 2 .  potassium chloride SA (K-DUR) 20 MEQ tablet, Take 3 tablets (60 mEq total) by mouth daily., Disp: 60 tablet, Rfl: 3 .  sacubitril-valsartan (ENTRESTO) 97-103 MG, Take 1 tablet by mouth 2 (two) times daily., Disp: 60 tablet, Rfl:  .  spironolactone (ALDACTONE) 50 MG tablet, Take 1 tablet (50 mg total) by mouth daily., Disp: 30 tablet,  Rfl: 2 .  torsemide (DEMADEX) 20 MG tablet, Take 2 tablets (40 mg total) by mouth daily., Disp: 90 tablet, Rfl: 2 No Known Allergies    Social History   Socioeconomic History  . Marital status: Single    Spouse name: Not on file  . Number of children: 1  . Years of education: 71  . Highest education level: Not on file  Occupational History  . Occupation: Therapist, music: [employer]  Social Needs  . Financial resource strain: Not very hard  . Food insecurity    Worry: Sometimes true    Inability: Sometimes true  . Transportation needs    Medical: No    Non-medical: No  Tobacco Use  . Smoking status: Never Smoker  . Smokeless tobacco: Never Used  Substance and Sexual Activity  . Alcohol use: No  . Drug use: No  . Sexual activity: Not on file  Lifestyle  . Physical activity    Days per week: Not on file    Minutes per session: Not on file  . Stress: Not on file  Relationships  . Social Musician on phone: Not on file    Gets together: Not on file    Attends religious service: Not on file    Active member of club or organization: Not on file    Attends meetings of clubs or organizations: Not on file    Relationship status: Not on file  . Intimate  partner violence    Fear of current or ex partner: Not on file    Emotionally abused: Not on file    Physically abused: Not on file    Forced sexual activity: Not on file  Other Topics Concern  . Not on file  Social History Narrative   Born and raised in [place], [place]. Currently live in a private residence with his mother. Fun: Play with his daughter.   Denies religious beliefs that would effect health care.     Physical Exam      Future Appointments  Date Time Provider [place]  08/24/2019 10:30 AM [facility] PA/NP [facility] None  10/11/2019  3:20 PM Biagio Borg, MD [facility]    BP (!) 94/0   Pulse 84   Temp 97.7 F (36.5 C)   Resp 16   Wt 234 lb (106.1 kg)   SpO2 96%    BMI 37.77 kg/m   Weight yesterday-232 Last visit weight-237  Pt reports he is feeling good, he denies sob, no c/p, no dizziness.  He wasn't sure about his torsemide dosing-he had not been taking it b/c he was told to stop and he wasn't sure if he was suppose to continue it--upon looking back per lab results he was to hold it for a couple days and then start back to 40mg  daily.  Weights at home is 231-234 this week.  He missed his lab appoint yesterday. We resch it for tomor around 08-1029.  meds verified and pill box refilled.  He needs refills on torsemide, entresto, hydralazine, carvedilol, isosorbide, spiro-- Carvedilol-from [facility], the rest was filled from [facility] pharmacy--entresto was a sample.  I asked chantel to move ll his meds [facility]. He does not have insurance at this time.  He has more samples of the 49-51, he did receive the entresto in them ail from company but it was the 97-103 dose. Spoke with chantel he was d/c on 97-103 dose and he should continue that if tolerable. Looking back at last notes he was on 97-103 and his b/p was around 564 systolic at that time as well.  No edema noted.    Marylouise Stacks, [facility] Paramedic  08/10/19

## 2019-08-10 NOTE — Telephone Encounter (Signed)
Katie called during patients home visit entresto clarified  Patient has samples of entresto 49/51 and a shipment of 19/103 from [employer], Patient can finish all sample doses then resume routine dose of 97/103 as he was previously prescribed since patient assistance was approved  Medication list updated and remaining HF medications sent to outpatient pharmacy

## 2019-08-11 ENCOUNTER — Ambulatory Visit (HOSPITAL_COMMUNITY)
Admission: RE | Admit: 2019-08-11 | Discharge: 2019-08-11 | Disposition: A | Discharge status: Home / Self Care | Payer: Self-pay | Source: Ambulatory Visit | Attending: Internal Medicine | Admitting: Internal Medicine

## 2019-08-11 ENCOUNTER — Other Ambulatory Visit: Payer: Self-pay

## 2019-08-11 DIAGNOSIS — I5022 Chronic systolic (congestive) heart failure: Secondary | ICD-10-CM | POA: Insufficient documentation

## 2019-08-11 LAB — BASIC METABOLIC PANEL
Anion gap: 12 (ref 5–15)
BUN: 28 mg/dL — ABNORMAL HIGH (ref 6–20)
CO2: 21 mmol/L — ABNORMAL LOW (ref 22–32)
Calcium: 9.6 mg/dL (ref 8.9–10.3)
Chloride: 104 mmol/L (ref 98–111)
Creatinine, Ser: 1.49 mg/dL — ABNORMAL HIGH (ref 0.61–1.24)
GFR calc Af Amer: >60 mL/min (ref >60)
GFR calc non Af Amer: >60 mL/min (ref >60)
Glucose, Bld: 97 mg/dL (ref 70–99)
Potassium: 4.6 mmol/L (ref 3.5–5.1)
Sodium: 137 mmol/L (ref 135–145)

## 2019-08-16 ENCOUNTER — Telehealth (HOSPITAL_COMMUNITY): Payer: Self-pay

## 2019-08-17 NOTE — Telephone Encounter (Signed)
I called Mr Gottlieb to schedule an appointment. He stated he was busy tomorrow and had to go into work early on Friday. I told him I could come as early as 08:00 but he said 09:30 would be better.

## 2019-08-18 ENCOUNTER — Telehealth (HOSPITAL_COMMUNITY): Payer: Self-pay

## 2019-08-18 ENCOUNTER — Other Ambulatory Visit (HOSPITAL_COMMUNITY): Payer: Self-pay

## 2019-08-18 MED ORDER — SPIRONOLACTONE 50 MG PO TABS: 50.0000 mg | ORAL_TABLET | Freq: Every day | ORAL | 3 refills | Status: DC

## 2019-08-18 MED ORDER — ISOSORBIDE MONONITRATE ER 60 MG PO TB24: 60.0000 mg | ORAL_TABLET | Freq: Every day | ORAL | 3 refills | Status: DC

## 2019-08-18 MED ORDER — CARVEDILOL 25 MG PO TABS: 25.0000 mg | ORAL_TABLET | Freq: Two times a day (BID) | ORAL | 3 refills | Status: DC

## 2019-08-18 MED ORDER — POTASSIUM CHLORIDE CRYS ER 20 MEQ PO TBCR: 60.0000 meq | EXTENDED_RELEASE_TABLET | Freq: Every day | ORAL | 3 refills | Status: DC

## 2019-08-18 MED ORDER — DIGOXIN 125 MCG PO TABS: 125.0000 ug | ORAL_TABLET | Freq: Every day | ORAL | 3 refills | Status: DC

## 2019-08-18 MED ORDER — HYDRALAZINE HCL 100 MG PO TABS: 100.0000 mg | ORAL_TABLET | Freq: Three times a day (TID) | ORAL | 3 refills | Status: DC

## 2019-08-18 MED ORDER — TORSEMIDE 20 MG PO TABS: 40.0000 mg | ORAL_TABLET | Freq: Every day | ORAL | 3 refills | Status: DC

## 2019-08-18 MED FILL — DIGOXIN 0.125 MG TABLET: 125 | 30 days supply | Qty: 30 | Fill #0

## 2019-08-18 MED FILL — TORSEMIDE 20 MG TABLET: 20 | 30 days supply | Qty: 60 | Fill #0

## 2019-08-18 MED FILL — POTASSIUM CHLORIDE CRYS ER: 20 | 30 days supply | Qty: 90 | Fill #0

## 2019-08-18 NOTE — Telephone Encounter (Signed)
I delivered Steven Chase medications to him after retrieving them from the outpatient pharmacy. He was hurried to get ready for work so he stated he would put them in his pillbox.   Jacquiline Doe, EMT 08/18/19

## 2019-08-18 NOTE — Progress Notes (Signed)
Paramedicine Encounter    Patient ID: Steven Chase, male    DOB: [birth year], 33 y.o.   MRN: [id number]   Patient Care Team: Biagio Borg, MD as PCP - General (Internal Medicine) Nahser, Wonda Cheng, MD as PCP - Cardiology (Cardiology) Jorge Ny, LCSW as Social Worker (Licensed Clinical Social Worker)  Patient Active Problem List   Diagnosis Date Noted  . Acute exacerbation of CHF (congestive heart failure) ([place]) 07/15/2019  . Lipodystrophy 10/21/2018  . Obesity (BMI 30-39.9) 10/06/2018  . Chronic systolic heart failure ([place]) 02/17/2018  . Fatigue 02/17/2018  . Cardiomyopathy ([place]) 02/11/2018  . Hypertensive crisis 02/04/2018  . Chest pain 02/04/2018  . CKD (chronic kidney disease), stage II 02/04/2018  . Allergic rhinitis 01/21/2018  . Hyperglycemia 12/02/2017  . Hypokalemia 12/02/2017  . Cough 12/02/2017  . Patellar tendonitis of left knee 12/02/2017  . Preventative health care 12/10/2016  . Obesity 05/25/2016  . Lower leg edema 12/12/2014  . Essential hypertension 11/05/2014  . OSA (obstructive sleep apnea) 11/05/2014    Current Outpatient Medications:  .  carvedilol (COREG) 25 MG tablet, Take 1 tablet (25 mg total) by mouth 2 (two) times daily with a meal., Disp: 60 tablet, Rfl: 2 .  digoxin (LANOXIN) 0.125 MG tablet, Take 1 tablet (125 mcg total) by mouth daily., Disp: 30 tablet, Rfl: 3 .  hydrALAZINE (APRESOLINE) 100 MG tablet, Take 1 tablet (100 mg total) by mouth every 8 (eight) hours., Disp: 90 tablet, Rfl: 2 .  isosorbide mononitrate (IMDUR) 60 MG 24 hr tablet, Take 1 tablet (60 mg total) by mouth daily., Disp: 30 tablet, Rfl: 2 .  potassium chloride SA (K-DUR) 20 MEQ tablet, Take 3 tablets (60 mEq total) by mouth daily., Disp: 60 tablet, Rfl: 3 .  spironolactone (ALDACTONE) 50 MG tablet, Take 1 tablet (50 mg total) by mouth daily., Disp: 30 tablet, Rfl: 2 .  torsemide (DEMADEX) 20 MG tablet, Take 2 tablets (40 mg total) by mouth daily., Disp: 90 tablet, Rfl:  2 .  sacubitril-valsartan (ENTRESTO) 97-103 MG, Take 1 tablet by mouth 2 (two) times daily., Disp: 60 tablet, Rfl:  No Known Allergies    Social History   Socioeconomic History  . Marital status: Single    Spouse name: Not on file  . Number of children: 1  . Years of education: 30  . Highest education level: Not on file  Occupational History  . Occupation: Research scientist (physical sciences): [employer]  Social Needs  . Financial resource strain: Not very hard  . Food insecurity    Worry: Sometimes true    Inability: Sometimes true  . Transportation needs    Medical: No    Non-medical: No  Tobacco Use  . Smoking status: Never Smoker  . Smokeless tobacco: Never Used  Substance and Sexual Activity  . Alcohol use: No  . Drug use: No  . Sexual activity: Not on file  Lifestyle  . Physical activity    Days per week: Not on file    Minutes per session: Not on file  . Stress: Not on file  Relationships  . Social Herbalist on phone: Not on file    Gets together: Not on file    Attends religious service: Not on file    Active member of club or organization: Not on file    Attends meetings of clubs or organizations: Not on file    Relationship status: Not on file  . Intimate  partner violence    Fear of current or ex partner: Not on file    Emotionally abused: Not on file    Physically abused: Not on file    Forced sexual activity: Not on file  Other Topics Concern  . Not on file  Social History Narrative   Born and raised in [place], [place]. Currently live in a private residence with his mother. Fun: Play with his daughter.   Denies religious beliefs that would effect health care.     Physical Exam Cardiovascular:     Rate and Rhythm: Regular rhythm. Tachycardia present.     Pulses: Normal pulses.  Pulmonary:     Effort: Pulmonary effort is normal.     Breath sounds: Normal breath sounds.  Musculoskeletal:     Right lower leg: No edema.     Left lower leg: No  edema.  Skin:    General: Skin is warm and dry.     Capillary Refill: Capillary refill takes less than 2 seconds.  Neurological:     Mental Status: He is alert and oriented to person, place, and time.  Psychiatric:        Mood and Affect: Mood normal.         Future Appointments  Date Time Provider Department Center  08/24/2019 10:30 AM [facility] PA/NP [facility] None  10/11/2019  3:20 PM Corwin Levins, MD [facility]    BP (!) 150/106 (BP Location: Left Arm, Patient Position: Standing, Cuff Size: Normal)   Pulse 100   Resp 16   Wt 233 lb 6.4 oz (105.9 kg)   SpO2 98%   BMI 37.67 kg/m   Weight yesterday- 232.4 lb Last visit weight- 234 lb  Steven Chase was seen at home today and reported feeling well. He denied chest pain, SOB, headache, dizziness, orthopnea, cough or fever since our last visit. He stated he has been compliant with his medications over the past week and his weight has been stable. His medications were verified however he was nearly out of most medications so I contacted the HF clinic and had them sent to the outpatient pharmacy. I will pick them up for him and bring them by before he has to leave for work.   [name], EMT 08/18/19  ACTION: Home visit completed Next visit planned for 1 week

## 2019-08-24 ENCOUNTER — Other Ambulatory Visit (HOSPITAL_COMMUNITY): Payer: Self-pay

## 2019-08-24 ENCOUNTER — Ambulatory Visit (HOSPITAL_COMMUNITY)
Admission: RE | Admit: 2019-08-24 | Discharge: 2019-08-24 | Disposition: A | Discharge status: Home / Self Care | Payer: Self-pay | Source: Ambulatory Visit | Attending: Internal Medicine | Admitting: Internal Medicine

## 2019-08-24 ENCOUNTER — Other Ambulatory Visit: Payer: Self-pay

## 2019-08-24 ENCOUNTER — Encounter (HOSPITAL_COMMUNITY): Payer: Self-pay

## 2019-08-24 VITALS — BP 116/60 | HR 86 | Wt 237.4 lb

## 2019-08-24 DIAGNOSIS — E669 Obesity, unspecified: Secondary | ICD-10-CM | POA: Insufficient documentation

## 2019-08-24 DIAGNOSIS — Z8249 Family history of ischemic heart disease and other diseases of the circulatory system: Secondary | ICD-10-CM | POA: Insufficient documentation

## 2019-08-24 DIAGNOSIS — I130 Hypertensive heart and chronic kidney disease with heart failure and stage 1 through stage 4 chronic kidney disease, or unspecified chronic kidney disease: Secondary | ICD-10-CM | POA: Insufficient documentation

## 2019-08-24 DIAGNOSIS — Z6838 Body mass index (BMI) 38.0-38.9, adult: Secondary | ICD-10-CM | POA: Insufficient documentation

## 2019-08-24 DIAGNOSIS — N183 Chronic kidney disease, stage 3 unspecified: Secondary | ICD-10-CM | POA: Insufficient documentation

## 2019-08-24 DIAGNOSIS — Z79899 Other long term (current) drug therapy: Secondary | ICD-10-CM | POA: Insufficient documentation

## 2019-08-24 DIAGNOSIS — I5022 Chronic systolic (congestive) heart failure: Secondary | ICD-10-CM | POA: Insufficient documentation

## 2019-08-24 DIAGNOSIS — I428 Other cardiomyopathies: Secondary | ICD-10-CM | POA: Insufficient documentation

## 2019-08-24 DIAGNOSIS — G4733 Obstructive sleep apnea (adult) (pediatric): Secondary | ICD-10-CM | POA: Insufficient documentation

## 2019-08-24 MED ORDER — ENTRESTO 49-51 MG PO TABS: 1.0000 | ORAL_TABLET | Freq: Two times a day (BID) | ORAL | 11 refills | Status: DC

## 2019-08-24 NOTE — Progress Notes (Signed)
Paramedicine Encounter   Patient ID: Steven Chase , male,   DOB: [birth year],32 y.o.,  MRN: [id number]  Mr Stmartin was seen at the HF clinic today with Darrick Grinder, NP. He reported feeling well and being compliant with his medications. His Entresto dosage was confirmed at 49/51 mg however he did not bring his pillbox so I will follow up tomorrow morning to refill his box.   Jacquiline Doe, EMT 08/24/2019   ACTION: Next visit planned for tomorrow morning.

## 2019-08-24 NOTE — Progress Notes (Signed)
PCP: Dr Jenny Reichmann  Primary Cardiologist:Dr Nahser HF MD: Dr Haroldine Laws   HPI: Steven Chase is a 33 y.o. male with chronic NICM(EF 15-20% in 07/2018),poorly controlledHTN, OSA, and obesity  Admitted with CP and ADHF in setting of uncontrolled HTN/HTN crisis. Prior to admit he had run out of a few of his HF medications. Diuresed with IV lasix + metolazone and later transitioned to torsemide 60 mg daily. Overall weight went down 13 pounds. HF meds adjusted with improvement in blood pressure. Entresto increased to 97-103 twice a day. Discharge weight was 242 pounds.   Today he returns for HF follow up.Overall feeling better. Denies SOB/PND/Orthopnea. Using CPAP every night. Appetite ok. No fever or chills. Weight at home 234 pounds. Taking all medications. Able to work again. No issues working. Followed by HF Paramedicine.   ROS: All systems negative except as listed in HPI, PMH and Problem List.  SH:  Social History   Socioeconomic History  . Marital status: Single    Spouse name: Not on file  . Number of children: 1  . Years of education: 56  . Highest education level: Not on file  Occupational History  . Occupation: Research scientist (physical sciences): [employer]  Social Needs  . Financial resource strain: Not very hard  . Food insecurity    Worry: Sometimes true    Inability: Sometimes true  . Transportation needs    Medical: No    Non-medical: No  Tobacco Use  . Smoking status: Never Smoker  . Smokeless tobacco: Never Used  Substance and Sexual Activity  . Alcohol use: No  . Drug use: No  . Sexual activity: Not on file  Lifestyle  . Physical activity    Days per week: Not on file    Minutes per session: Not on file  . Stress: Not on file  Relationships  . Social Herbalist on phone: Not on file    Gets together: Not on file    Attends religious service: Not on file    Active member of club or organization: Not on file    Attends meetings of clubs or  organizations: Not on file    Relationship status: Not on file  . Intimate partner violence    Fear of current or ex partner: Not on file    Emotionally abused: Not on file    Physically abused: Not on file    Forced sexual activity: Not on file  Other Topics Concern  . Not on file  Social History Narrative   Born and raised in [place], [place]. Currently live in a private residence with his mother. Fun: Play with his daughter.   Denies religious beliefs that would effect health care.     FH:  Family History  Problem Relation Age of Onset  . Hypertension Mother   . Heart disease Maternal Grandfather   . Hypertension Maternal Grandfather   . Hypertension Sister     Past Medical History:  Diagnosis Date  . Hypertension   . Obesity   . OSA (obstructive sleep apnea) 11/05/2014   02/2015 -severe -AHI 106/h     Current Outpatient Medications  Medication Sig Dispense Refill  . carvedilol (COREG) 25 MG tablet Take 1 tablet (25 mg total) by mouth 2 (two) times daily with a meal. 60 tablet 3  . digoxin (LANOXIN) 0.125 MG tablet Take 1 tablet (125 mcg total) by mouth daily. 30 tablet 3  . hydrALAZINE (APRESOLINE) 100 MG  tablet Take 1 tablet (100 mg total) by mouth every 8 (eight) hours. 90 tablet 3  . isosorbide mononitrate (IMDUR) 60 MG 24 hr tablet Take 1 tablet (60 mg total) by mouth daily. 30 tablet 3  . potassium chloride SA (KLOR-CON) 20 MEQ tablet Take 3 tablets (60 mEq total) by mouth daily. 90 tablet 3  . sacubitril-valsartan (ENTRESTO) 49-51 MG Take 1 tablet by mouth 2 (two) times daily.    Marland Kitchen spironolactone (ALDACTONE) 50 MG tablet Take 1 tablet (50 mg total) by mouth daily. 30 tablet 3  . torsemide (DEMADEX) 20 MG tablet Take 2 tablets (40 mg total) by mouth daily. 60 tablet 3   No current facility-administered medications for this encounter.     Vitals:   08/24/19 0954  BP: 116/60  Pulse: 86  SpO2: 94%  Weight: 107.7 kg (237 lb 6.4 oz)   Wt Readings from Last 3  Encounters:  08/24/19 107.7 kg (237 lb 6.4 oz)  08/18/19 105.9 kg (233 lb 6.4 oz)  08/10/19 106.1 kg (234 lb)     PHYSICAL EXAM: General:  Well appearing. No resp difficulty HEENT: normal Neck: supple. no JVD. Carotids 2+ bilat; no bruits. No lymphadenopathy or thryomegaly appreciated. Cor: PMI nondisplaced. Regular rate & rhythm. No rubs, gallops or murmurs. Lungs: clear Abdomen: soft, nontender, distended. No hepatosplenomegaly. No bruits or masses. Good bowel sounds. Extremities: no cyanosis, clubbing, rash, edema Neuro: alert & orientedx3, cranial nerves grossly intact. moves all 4 extremities w/o difficulty. Affect flat.      ASSESSMENT & PLAN: 1.Chronic systolic [facility] setting of hypertensive crisis and medication non-complaince - NICM by cath 02/07/18, with low cardiac output. Suspected primarily HTN cardiomyopathy -Echo4/04/2018 LVEF 15%, Grade 2 DD, Trivial AI, Mild MR, Mild LAE, PA peak pressure 38 mm Hg, small pericardial effusion noted. - cMRI4/9/19 LVEF 19%, Normal RV, Discrete nodule in mid inferior wall concerning for possible Sarcoid involvement. No amyloid. - Echo 07/2018 EF 25%. Referred to EP for ICD, but canceled appointment. He is not interested in ICD. - Echo 07/16/19 EF 20% mild RV HK -NYHA II. Volume status stable. Continue torsemide 40 mg daily.  - Continue carvedilol 25 bid. -Continue hydralazine 100 mg three times a day + IMdur 60 mg daily. - Spiro 50 daily  - Continue entresto 49-51 mg twice a day.  - Check BMET today.   3.HTN  Stable.   4.OSA - Sleep study 03/30/18 with severe OSA. AHI 81.4 with sats down to 63% - Continue CPAP nightly.    5. CKD III -Baseline 1.3-1.5 Recent BMET stable.   6. Obesity Body mass index is 38.32 kg/m. Discussed portion control.   Follow up in 8 weeks with Dr Gala Romney and an ECHO.     Jamareon Shimel NP-C  10:09 AM

## 2019-08-24 NOTE — Patient Instructions (Signed)
Your physician has requested that you have an echocardiogram. Echocardiography is a painless test that uses sound waves to create images of your heart. It provides your doctor with information about the size and shape of your heart and how well your heart's chambers and valves are working. This procedure takes approximately one hour. There are no restrictions for this procedure. This will be done at your next appointment.  Please follow up with the [facility] in 8 weeks with an echocardiogram.  At the [facility], you and your health needs are our priority. As part of our continuing mission to provide you with exceptional heart care, we have created designated Provider Care Teams. These Care Teams include your primary Cardiologist (physician) and Advanced Practice Providers (APPs- Physician Assistants and Nurse Practitioners) who all work together to provide you with the care you need, when you need it.   You may see any of the following providers on your designated Care Team at your next follow up: Marland Kitchen Dr Glori Bickers . Dr Loralie Champagne . Darrick Grinder, NP . Lyda Jester, PA   Please be sure to bring in all your medications bottles to every appointment.

## 2019-08-25 ENCOUNTER — Other Ambulatory Visit (HOSPITAL_COMMUNITY): Payer: Self-pay | Admitting: *Deleted

## 2019-08-25 ENCOUNTER — Other Ambulatory Visit (HOSPITAL_COMMUNITY): Payer: Self-pay

## 2019-08-25 MED ORDER — ENTRESTO 49-51 MG PO TABS: 1.0000 | ORAL_TABLET | Freq: Two times a day (BID) | ORAL | 11 refills | Status: DC

## 2019-08-25 NOTE — Progress Notes (Signed)
Paramedicine Encounter    Patient ID: Steven Chase, male    DOB: [birth year], 33 y.o.   MRN: [id number]   Patient Care Team: Steven Borg, MD as PCP - General (Internal Medicine) Nahser, Wonda Cheng, MD as PCP - Cardiology (Cardiology) Steven Ny, LCSW as Social Worker (Licensed Clinical Social Worker)  Patient Active Problem List   Diagnosis Date Noted  . Acute exacerbation of CHF (congestive heart failure) ([facility]) 07/15/2019  . Lipodystrophy 10/21/2018  . Obesity (BMI 30-39.9) 10/06/2018  . Chronic systolic heart failure ([place]) 02/17/2018  . Fatigue 02/17/2018  . Cardiomyopathy ([facility]) 02/11/2018  . Hypertensive crisis 02/04/2018  . Chest pain 02/04/2018  . CKD (chronic kidney disease), stage II 02/04/2018  . Allergic rhinitis 01/21/2018  . Hyperglycemia 12/02/2017  . Hypokalemia 12/02/2017  . Cough 12/02/2017  . Patellar tendonitis of left knee 12/02/2017  . Preventative health care 12/10/2016  . Obesity 05/25/2016  . Lower leg edema 12/12/2014  . Essential hypertension 11/05/2014  . OSA (obstructive sleep apnea) 11/05/2014    Current Outpatient Medications:  .  carvedilol (COREG) 25 MG tablet, Take 1 tablet (25 mg total) by mouth 2 (two) times daily with a meal., Disp: 60 tablet, Rfl: 3 .  digoxin (LANOXIN) 0.125 MG tablet, Take 1 tablet (125 mcg total) by mouth daily., Disp: 30 tablet, Rfl: 3 .  hydrALAZINE (APRESOLINE) 100 MG tablet, Take 1 tablet (100 mg total) by mouth every 8 (eight) hours., Disp: 90 tablet, Rfl: 3 .  isosorbide mononitrate (IMDUR) 60 MG 24 hr tablet, Take 1 tablet (60 mg total) by mouth daily., Disp: 30 tablet, Rfl: 3 .  potassium chloride SA (KLOR-CON) 20 MEQ tablet, Take 3 tablets (60 mEq total) by mouth daily., Disp: 90 tablet, Rfl: 3 .  sacubitril-valsartan (ENTRESTO) 49-51 MG, Take 1 tablet by mouth 2 (two) times daily., Disp: 60 tablet, Rfl: 11 .  spironolactone (ALDACTONE) 50 MG tablet, Take 1 tablet (50 mg total) by mouth daily., Disp: 30  tablet, Rfl: 3 .  torsemide (DEMADEX) 20 MG tablet, Take 2 tablets (40 mg total) by mouth daily., Disp: 60 tablet, Rfl: 3 No Known Allergies    Social History   Socioeconomic History  . Marital status: Single    Spouse name: Not on file  . Number of children: 1  . Years of education: 85  . Highest education level: Not on file  Occupational History  . Occupation: Research scientist (physical sciences): [employer]  Social Needs  . Financial resource strain: Not very hard  . Food insecurity    Worry: Sometimes true    Inability: Sometimes true  . Transportation needs    Medical: No    Non-medical: No  Tobacco Use  . Smoking status: Never Smoker  . Smokeless tobacco: Never Used  Substance and Sexual Activity  . Alcohol use: No  . Drug use: No  . Sexual activity: Not on file  Lifestyle  . Physical activity    Days per week: Not on file    Minutes per session: Not on file  . Stress: Not on file  Relationships  . Social Herbalist on phone: Not on file    Gets together: Not on file    Attends religious service: Not on file    Active member of club or organization: Not on file    Attends meetings of clubs or organizations: Not on file    Relationship status: Not on file  . Intimate  partner violence    Fear of current or ex partner: Not on file    Emotionally abused: Not on file    Physically abused: Not on file    Forced sexual activity: Not on file  Other Topics Concern  . Not on file  Social History Narrative   Born and raised in [place], [place]. Currently live in a private residence with his mother. Fun: Play with his daughter.   Denies religious beliefs that would effect health care.     Physical Exam      Future Appointments  Date Time Provider [place]  10/11/2019  3:20 PM Steven Borg, MD [facility]  11/10/2019  9:00 AM [facility] E[facility] OP 1 [facility]  11/10/2019 10:00 AM Bensimhon, Shaune Pascal, MD [facility] None    Mr Steven Chase was seen at home this  morning and reported feeling well. He denied chest pain, SOB, headache, dizziness, orthopnea, fever or cough. He stated he has been compliant with his medications and his weight has been stable. He was just seen in the HF clinic yesterday so the purpose of today's visit with to refill his pillbox. Nothing further was necessary at this time. I will follow up next Tuesday.   Jacquiline Doe, EMT 08/25/19  ACTION: Home visit completed Next visit planned for Tuesday

## 2019-08-28 ENCOUNTER — Telehealth (HOSPITAL_COMMUNITY): Payer: Self-pay | Admitting: Licensed Clinical Social Worker

## 2019-08-28 NOTE — Telephone Encounter (Signed)
CSW called [employer] to confirm they have new prescription of Entresto for patient- they confirmed they have processed the new script and can deliver it as soon as they hear back from the patient.  CSW connected pt to the call and they setup delivery for tomorrow.  CSW will continue to follow and assist as needed  Jorge Ny, [facility] Desk#: [phone] Cell#: [phone]

## 2019-08-29 ENCOUNTER — Other Ambulatory Visit (HOSPITAL_COMMUNITY): Payer: Self-pay

## 2019-08-29 NOTE — Progress Notes (Signed)
Paramedicine Encounter    Patient ID: Steven Chase, male    DOB: [birth year], 33 y.o.   MRN: [id number]   Patient Care Team: Steven Borg, MD as PCP - General (Internal Medicine) Nahser, Wonda Cheng, MD as PCP - Cardiology (Cardiology) Steven Ny, LCSW as Social Worker (Licensed Clinical Social Worker)  Patient Active Problem List   Diagnosis Date Noted  . Acute exacerbation of CHF (congestive heart failure) ([facility]) 07/15/2019  . Lipodystrophy 10/21/2018  . Obesity (BMI 30-39.9) 10/06/2018  . Chronic systolic heart failure ([place]) 02/17/2018  . Fatigue 02/17/2018  . Cardiomyopathy ([facility]) 02/11/2018  . Hypertensive crisis 02/04/2018  . Chest pain 02/04/2018  . CKD (chronic kidney disease), stage II 02/04/2018  . Allergic rhinitis 01/21/2018  . Hyperglycemia 12/02/2017  . Hypokalemia 12/02/2017  . Cough 12/02/2017  . Patellar tendonitis of left knee 12/02/2017  . Preventative health care 12/10/2016  . Obesity 05/25/2016  . Lower leg edema 12/12/2014  . Essential hypertension 11/05/2014  . OSA (obstructive sleep apnea) 11/05/2014    Current Outpatient Medications:  .  carvedilol (COREG) 25 MG tablet, Take 1 tablet (25 mg total) by mouth 2 (two) times daily with a meal., Disp: 60 tablet, Rfl: 3 .  digoxin (LANOXIN) 0.125 MG tablet, Take 1 tablet (125 mcg total) by mouth daily., Disp: 30 tablet, Rfl: 3 .  hydrALAZINE (APRESOLINE) 100 MG tablet, Take 1 tablet (100 mg total) by mouth every 8 (eight) hours., Disp: 90 tablet, Rfl: 3 .  isosorbide mononitrate (IMDUR) 60 MG 24 hr tablet, Take 1 tablet (60 mg total) by mouth daily., Disp: 30 tablet, Rfl: 3 .  potassium chloride SA (KLOR-CON) 20 MEQ tablet, Take 3 tablets (60 mEq total) by mouth daily., Disp: 90 tablet, Rfl: 3 .  sacubitril-valsartan (ENTRESTO) 49-51 MG, Take 1 tablet by mouth 2 (two) times daily., Disp: 60 tablet, Rfl: 11 .  spironolactone (ALDACTONE) 50 MG tablet, Take 1 tablet (50 mg total) by mouth daily., Disp: 30  tablet, Rfl: 3 .  torsemide (DEMADEX) 20 MG tablet, Take 2 tablets (40 mg total) by mouth daily., Disp: 60 tablet, Rfl: 3 No Known Allergies    Social History   Socioeconomic History  . Marital status: Single    Spouse name: Not on file  . Number of children: 1  . Years of education: 85  . Highest education level: Not on file  Occupational History  . Occupation: Research scientist (physical sciences): [employer]  Social Needs  . Financial resource strain: Not very hard  . Food insecurity    Worry: Sometimes true    Inability: Sometimes true  . Transportation needs    Medical: No    Non-medical: No  Tobacco Use  . Smoking status: Never Smoker  . Smokeless tobacco: Never Used  Substance and Sexual Activity  . Alcohol use: No  . Drug use: No  . Sexual activity: Not on file  Lifestyle  . Physical activity    Days per week: Not on file    Minutes per session: Not on file  . Stress: Not on file  Relationships  . Social Herbalist on phone: Not on file    Gets together: Not on file    Attends religious service: Not on file    Active member of club or organization: Not on file    Attends meetings of clubs or organizations: Not on file    Relationship status: Not on file  . Intimate  partner violence    Fear of current or ex partner: Not on file    Emotionally abused: Not on file    Physically abused: Not on file    Forced sexual activity: Not on file  Other Topics Concern  . Not on file  Social History Narrative   Born and raised in [place], [place]. Currently live in a private residence with his mother. Fun: Play with his daughter.   Denies religious beliefs that would effect health care.     Physical Exam Cardiovascular:     Rate and Rhythm: Normal rate and regular rhythm.     Pulses: Normal pulses.  Pulmonary:     Effort: Pulmonary effort is normal.  Abdominal:     General: There is no distension.  Musculoskeletal: Normal range of motion.     Right lower leg:  No edema.     Left lower leg: No edema.  Skin:    General: Skin is warm and dry.     Capillary Refill: Capillary refill takes less than 2 seconds.  Neurological:     Mental Status: He is alert and oriented to person, place, and time.  Psychiatric:        Mood and Affect: Mood normal.         Future Appointments  Date Time Provider Department Center  10/11/2019  3:20 PM Steven Levins, MD [facility]  11/10/2019  9:00 AM [facility] ECHO OP 1 [facility]  11/10/2019 10:00 AM Bensimhon, Bevelyn Buckles, MD [facility] None    BP 104/84 (BP Location: Left Arm, Patient Position: Sitting, Cuff Size: Normal)   Pulse 80   Temp 98.6 F (37 C)   Resp 16   Wt 235 lb (106.6 kg)   SpO2 96%   BMI 37.93 kg/m   Weight yesterday- 233 lb Last visit weight- 237 lb   Steven Chase was seen at home today and reported feeling well. He denied chest pain, OB, headache, dizziness, orthopnea, fever or cough since our last visit. He stated he has been compliant with his medications and his weight has been stable. His medications were verified and his pillbox was refilled. We discussed his insurance and it turns out he previously had insurance via the [employer] so I gathered some financial information and will look for a plan to suit him when open enrollment begins next week. I will follow up next week. 1600[employer]mo [employer]- [address] [place], [place] 08/29/19  ACTION: Home visit completed Next visit planned for 1 week

## 2019-08-31 ENCOUNTER — Encounter (HOSPITAL_COMMUNITY): Payer: Self-pay | Admitting: Emergency Medicine

## 2019-08-31 ENCOUNTER — Other Ambulatory Visit: Payer: Self-pay

## 2019-08-31 ENCOUNTER — Ambulatory Visit (HOSPITAL_COMMUNITY)
Admission: EM | Admit: 2019-08-31 | Discharge: 2019-08-31 | Disposition: A | Discharge status: Home / Self Care | Payer: Self-pay | Attending: Emergency Medicine | Admitting: Emergency Medicine

## 2019-08-31 DIAGNOSIS — M25562 Pain in left knee: Secondary | ICD-10-CM

## 2019-08-31 MED ORDER — IBUPROFEN 600 MG PO TABS: 600.0000 mg | ORAL_TABLET | Freq: Four times a day (QID) | ORAL | 0 refills | Status: DC | PRN

## 2019-08-31 NOTE — ED Provider Notes (Signed)
[facility]    CSN: [id number] Arrival date & time: 08/31/19  1415      History   Chief Complaint Chief Complaint  Patient presents with  . Knee Pain    HPI Steven Chase is a 33 y.o. male.   Patient presents with left knee pain since waking up this morning.  He denies numbness, paresthesias, weakness.  He denies falls or injury.  He denies fever, redness, swelling, or other symptoms.  No treatments attempted at home.  He states he needs a work note to be out through the whole weekend until Monday.  The history is provided by the patient.    Past Medical History:  Diagnosis Date  . Hypertension   . Obesity   . OSA (obstructive sleep apnea) 11/05/2014   02/2015 -severe -AHI 106/h     Patient Active Problem List   Diagnosis Date Noted  . Acute exacerbation of CHF (congestive heart failure) (HCC) 07/15/2019  . Lipodystrophy 10/21/2018  . Obesity (BMI 30-39.9) 10/06/2018  . Chronic systolic heart failure (HCC) 02/17/2018  . Fatigue 02/17/2018  . Cardiomyopathy (HCC) 02/11/2018  . Hypertensive crisis 02/04/2018  . Chest pain 02/04/2018  . CKD (chronic kidney disease), stage II 02/04/2018  . Allergic rhinitis 01/21/2018  . Hyperglycemia 12/02/2017  . Hypokalemia 12/02/2017  . Cough 12/02/2017  . Patellar tendonitis of left knee 12/02/2017  . Preventative health care 12/10/2016  . Obesity 05/25/2016  . Lower leg edema 12/12/2014  . Essential hypertension 11/05/2014  . OSA (obstructive sleep apnea) 11/05/2014    Past Surgical History:  Procedure Laterality Date  . HERNIA REPAIR    . RIGHT/LEFT HEART CATH AND CORONARY ANGIOGRAPHY N/A 02/07/2018   Procedure: RIGHT/LEFT HEART CATH AND CORONARY ANGIOGRAPHY;  Surgeon: Marykay Lex, MD;  Location: [facility] INVASIVE CV LAB;  Service: Cardiovascular;  Laterality: N/A;       Home Medications    Prior to Admission medications   Medication Sig Start Date End Date Taking? Authorizing Provider  carvedilol (COREG)  25 MG tablet Take 1 tablet (25 mg total) by mouth 2 (two) times daily with a meal. 08/18/19  Yes Bensimhon, Bevelyn Buckles, MD  digoxin (LANOXIN) 0.125 MG tablet Take 1 tablet (125 mcg total) by mouth daily. 08/18/19  Yes Bensimhon, Bevelyn Buckles, MD  hydrALAZINE (APRESOLINE) 100 MG tablet Take 1 tablet (100 mg total) by mouth every 8 (eight) hours. 08/18/19  Yes Bensimhon, Bevelyn Buckles, MD  isosorbide mononitrate (IMDUR) 60 MG 24 hr tablet Take 1 tablet (60 mg total) by mouth daily. 08/18/19  Yes Bensimhon, Bevelyn Buckles, MD  potassium chloride SA (KLOR-CON) 20 MEQ tablet Take 3 tablets (60 mEq total) by mouth daily. 08/18/19  Yes Bensimhon, Bevelyn Buckles, MD  sacubitril-valsartan (ENTRESTO) 49-51 MG Take 1 tablet by mouth 2 (two) times daily. 08/25/19  Yes Clegg, Amy D, NP  spironolactone (ALDACTONE) 50 MG tablet Take 1 tablet (50 mg total) by mouth daily. 08/18/19  Yes Bensimhon, Bevelyn Buckles, MD  torsemide (DEMADEX) 20 MG tablet Take 2 tablets (40 mg total) by mouth daily. 08/18/19  Yes Bensimhon, Bevelyn Buckles, MD  ibuprofen (ADVIL) 600 MG tablet Take 1 tablet (600 mg total) by mouth every 6 (six) hours as needed. 08/31/19   Mickie Bail, NP    Family History Family History  Problem Relation Age of Onset  . Hypertension Mother   . Heart disease Maternal Grandfather   . Hypertension Maternal Grandfather   . Hypertension Sister  Social History Social History   Tobacco Use  . Smoking status: Never Smoker  . Smokeless tobacco: Never Used  Substance Use Topics  . Alcohol use: No  . Drug use: No     Allergies   Patient has no known allergies.   Review of Systems Review of Systems  Constitutional: Negative for chills and fever.  HENT: Negative for ear pain and sore throat.   Eyes: Negative for pain and visual disturbance.  Respiratory: Negative for cough and shortness of breath.   Cardiovascular: Negative for chest pain and palpitations.  Gastrointestinal: Negative for abdominal pain and vomiting.   Genitourinary: Negative for dysuria and hematuria.  Musculoskeletal: Positive for arthralgias. Negative for back pain.  Skin: Negative for color change and rash.  Neurological: Negative for seizures, syncope, weakness and numbness.  All other systems reviewed and are negative.    Physical Exam Triage Vital Signs ED Triage Vitals  Enc Vitals Group     BP      Pulse      Resp      Temp      Temp src      SpO2      Weight      Height      Head Circumference      Peak Flow      Pain Score      Pain Loc      Pain Edu?      Excl. in GC?    No data found.  Updated Vital Signs BP 94/69 (BP Location: Left Arm) Comment: checked twice and verified  Pulse 91   Temp 98.1 F (36.7 C) (Temporal)   Resp 16   SpO2 94%   Visual Acuity Right Eye Distance:   Left Eye Distance:   Bilateral Distance:    Right Eye Near:   Left Eye Near:    Bilateral Near:     Physical Exam Vitals signs and nursing note reviewed.  Constitutional:      Appearance: He is well-developed.  HENT:     Head: Normocephalic and atraumatic.     Mouth/Throat:     Mouth: Mucous membranes are moist.     Pharynx: Oropharynx is clear.  Eyes:     Conjunctiva/sclera: Conjunctivae normal.  Neck:     Musculoskeletal: Neck supple.  Cardiovascular:     Rate and Rhythm: Normal rate and regular rhythm.     Heart sounds: No murmur.  Pulmonary:     Effort: Pulmonary effort is normal. No respiratory distress.     Breath sounds: Normal breath sounds.  Abdominal:     Palpations: Abdomen is soft.     Tenderness: There is no abdominal tenderness. There is no guarding or rebound.  Musculoskeletal:        General: No swelling, tenderness, deformity or signs of injury.  Skin:    General: Skin is warm and dry.     Capillary Refill: Capillary refill takes less than 2 seconds.     Findings: No bruising, erythema, lesion or rash.  Neurological:     General: No focal deficit present.     Mental Status: He is alert and  oriented to person, place, and time.     Sensory: No sensory deficit.     Motor: No weakness.     Gait: Gait normal.      UC Treatments / Results  Labs (all labs ordered are listed, but only abnormal results are displayed) Labs Reviewed - No data to  display  EKG   Radiology No results found.  Procedures Procedures (including critical care time)  Medications Ordered in UC Medications - No data to display  Initial Impression / Assessment and Plan / UC Course  I have reviewed the triage vital signs and the nursing notes.  Pertinent labs & imaging results that were available during my care of the patient were reviewed by me and considered in my medical decision making (see chart for details).     Acute left knee pain.  Treating with prescribed ibuprofen.  Instructed patient to rest and elevate his knee; and to apply ice packs as directed.  Instructed him to follow-up with his PCP if his symptoms persist or worsen.  Work note provided for today.  Patient agrees to plan of care.     Final Clinical Impressions(s) / UC Diagnoses   Final diagnoses:  Acute pain of left knee     Discharge Instructions     Take the ibuprofen as prescribed.  Rest and elevate your knee.  Apply ice packs 2-3 times a day for up to 20 minutes each.    Follow up with your primary care provider if your symptoms continue or worsen;  Or if you develop new symptoms, such as numbness, tingling, or weakness.        ED Prescriptions    Medication Sig Dispense Auth. Provider   ibuprofen (ADVIL) 600 MG tablet Take 1 tablet (600 mg total) by mouth every 6 (six) hours as needed. 30 tablet Sharion Balloon, NP     I have reviewed the PDMP during this encounter.   Sharion Balloon, NP 08/31/19 1501

## 2019-08-31 NOTE — ED Triage Notes (Signed)
Woke up with left knee hurting, no injury

## 2019-08-31 NOTE — Discharge Instructions (Addendum)
Take the ibuprofen as prescribed.  Rest and elevate your knee.  Apply ice packs 2-3 times a day for up to 20 minutes each.    Follow up with your primary care provider if your symptoms continue or worsen;  Or if you develop new symptoms, such as numbness, tingling, or weakness.

## 2019-09-05 ENCOUNTER — Telehealth (HOSPITAL_COMMUNITY): Payer: Self-pay

## 2019-09-05 NOTE — Telephone Encounter (Signed)
I returned a missed call from Steven Chase. He did not answer so I left a message requesting he call me back.

## 2019-09-06 ENCOUNTER — Telehealth (HOSPITAL_COMMUNITY): Payer: Self-pay | Admitting: Licensed Clinical Social Worker

## 2019-09-06 ENCOUNTER — Other Ambulatory Visit (HOSPITAL_COMMUNITY): Payer: Self-pay

## 2019-09-06 MED FILL — hydrALAZINE HCL 100 MG TABS: 100 | 30 days supply | Qty: 90 | Fill #1

## 2019-09-06 MED FILL — SPIRONOLACTONE 50 MG TABLET: 50 | 30 days supply | Qty: 30 | Fill #1

## 2019-09-06 MED FILL — ISOSORBIDE MN ER 60 MG TAB: 60 | 30 days supply | Qty: 30 | Fill #1

## 2019-09-06 NOTE — Telephone Encounter (Signed)
CSW received call from [employer] that pt had still not received his [employer] from [employer] which [facility] assisted in ordering on 10/26  CSW called [employer] who stated that there had been several delivery attempts but that because no one was at the address to sign it was sent to a [employer] access point ([employer] at [address])- they state pt will need to pick up the medication himself with his ID.  CSW updated paramedic and pt- will continue to follow and assist as needed  Jorge Ny, [facility] Desk#: [phone] Cell#: [phone]

## 2019-09-06 NOTE — Progress Notes (Signed)
Paramedicine Encounter    Patient ID: Steven Chase, male    DOB: [birth year], 33 y.o.   MRN: [id number]   Patient Care Team: Corwin Levins, MD as PCP - General (Internal Medicine) Nahser, Deloris Ping, MD as PCP - Cardiology (Cardiology) Burna Sis, LCSW as Social Worker (Licensed Clinical Social Worker)  Patient Active Problem List   Diagnosis Date Noted  . Acute exacerbation of CHF (congestive heart failure) (HCC) 07/15/2019  . Lipodystrophy 10/21/2018  . Obesity (BMI 30-39.9) 10/06/2018  . Chronic systolic heart failure (HCC) 02/17/2018  . Fatigue 02/17/2018  . Cardiomyopathy (HCC) 02/11/2018  . Hypertensive crisis 02/04/2018  . Chest pain 02/04/2018  . CKD (chronic kidney disease), stage II 02/04/2018  . Allergic rhinitis 01/21/2018  . Hyperglycemia 12/02/2017  . Hypokalemia 12/02/2017  . Cough 12/02/2017  . Patellar tendonitis of left knee 12/02/2017  . Preventative health care 12/10/2016  . Obesity 05/25/2016  . Lower leg edema 12/12/2014  . Essential hypertension 11/05/2014  . OSA (obstructive sleep apnea) 11/05/2014    Current Outpatient Medications:  .  carvedilol (COREG) 25 MG tablet, Take 1 tablet (25 mg total) by mouth 2 (two) times daily with a meal., Disp: 60 tablet, Rfl: 3 .  digoxin (LANOXIN) 0.125 MG tablet, Take 1 tablet (125 mcg total) by mouth daily., Disp: 30 tablet, Rfl: 3 .  hydrALAZINE (APRESOLINE) 100 MG tablet, Take 1 tablet (100 mg total) by mouth every 8 (eight) hours., Disp: 90 tablet, Rfl: 3 .  ibuprofen (ADVIL) 600 MG tablet, Take 1 tablet (600 mg total) by mouth every 6 (six) hours as needed., Disp: 30 tablet, Rfl: 0 .  isosorbide mononitrate (IMDUR) 60 MG 24 hr tablet, Take 1 tablet (60 mg total) by mouth daily., Disp: 30 tablet, Rfl: 3 .  potassium chloride SA (KLOR-CON) 20 MEQ tablet, Take 3 tablets (60 mEq total) by mouth daily., Disp: 90 tablet, Rfl: 3 .  spironolactone (ALDACTONE) 50 MG tablet, Take 1 tablet (50 mg total) by mouth daily.,  Disp: 30 tablet, Rfl: 3 .  torsemide (DEMADEX) 20 MG tablet, Take 2 tablets (40 mg total) by mouth daily., Disp: 60 tablet, Rfl: 3 .  sacubitril-valsartan (ENTRESTO) 49-51 MG, Take 1 tablet by mouth 2 (two) times daily., Disp: 60 tablet, Rfl: 11 No Known Allergies    Social History   Socioeconomic History  . Marital status: Single    Spouse name: Not on file  . Number of children: 1  . Years of education: 76  . Highest education level: Not on file  Occupational History  . Occupation: Therapist, music: [employer]  Social Needs  . Financial resource strain: Not very hard  . Food insecurity    Worry: Sometimes true    Inability: Sometimes true  . Transportation needs    Medical: No    Non-medical: No  Tobacco Use  . Smoking status: Never Smoker  . Smokeless tobacco: Never Used  Substance and Sexual Activity  . Alcohol use: No  . Drug use: No  . Sexual activity: Not on file  Lifestyle  . Physical activity    Days per week: Not on file    Minutes per session: Not on file  . Stress: Not on file  Relationships  . Social Musician on phone: Not on file    Gets together: Not on file    Attends religious service: Not on file    Active member of club or organization:  Not on file    Attends meetings of clubs or organizations: Not on file    Relationship status: Not on file  . Intimate partner violence    Fear of current or ex partner: Not on file    Emotionally abused: Not on file    Physically abused: Not on file    Forced sexual activity: Not on file  Other Topics Concern  . Not on file  Social History Narrative   Born and raised in [place], [place]. Currently live in a private residence with his mother. Fun: Play with his daughter.   Denies religious beliefs that would effect health care.     Physical Exam      Future Appointments  Date Time Provider [place]  10/11/2019  3:20 PM Biagio Borg, MD [facility]  11/10/2019  9:00 AM  [facility] 1 [facility]  11/10/2019 10:00 AM Bensimhon, Shaune Pascal, MD [facility] None    BP 124/90 (BP Location: Left Arm, Patient Position: Sitting, Cuff Size: Normal)   Pulse 87   Resp 16   Wt 245 lb 9.6 oz (111.4 kg)   SpO2 96%   BMI 39.64 kg/m   Weight yesterday- 234 lb Last visit weight- 235 lb  Steven Chase was seen at home today and reported feeling well. He denied chest pain, SOB, headache, dizziness, orthopnea, cough or fever in the past week. He stated he has been compliant with his medications and his weight has been stable. He has not received shipment of his Entresto 49/51 mg so he started taking the 97/103 mg which he had previously been given. I looked at his blood pressure record and it shows that he has been tolerating it well with only one day of a sub 035 mmHg systolic pressure. Given this information I will confer with the HF staff and see if they want to increase his dosing to the 97/103 mg. His shipment of 49/51 mg Delene Loll is at a [facility] pick up center. Steven Chase has the address and said he will pick it up tomorrow. His medications were verified and his pillbox was refilled. I will follow up after hearing back from the HF clinic staff about his Entresto dose.   Hydralazine, spironolactone, isosorbide ordered  Jacquiline Doe, EMT 09/06/19  ACTION: Home visit completed Next visit planned for 1 week

## 2019-09-11 ENCOUNTER — Other Ambulatory Visit: Payer: Self-pay

## 2019-09-11 ENCOUNTER — Encounter (HOSPITAL_COMMUNITY): Payer: Self-pay

## 2019-09-11 ENCOUNTER — Ambulatory Visit (HOSPITAL_COMMUNITY)
Admission: EM | Admit: 2019-09-11 | Discharge: 2019-09-11 | Disposition: A | Discharge status: Home / Self Care | Payer: Self-pay | Attending: Family Medicine | Admitting: Family Medicine

## 2019-09-11 DIAGNOSIS — Z20822 Contact with and (suspected) exposure to covid-19: Secondary | ICD-10-CM

## 2019-09-11 DIAGNOSIS — Z20828 Contact with and (suspected) exposure to other viral communicable diseases: Secondary | ICD-10-CM | POA: Insufficient documentation

## 2019-09-11 DIAGNOSIS — R112 Nausea with vomiting, unspecified: Secondary | ICD-10-CM | POA: Insufficient documentation

## 2019-09-11 NOTE — ED Triage Notes (Signed)
Pt presents to UC stating he would like a covid test. Pt denies any symptoms or exposures.

## 2019-09-11 NOTE — ED Provider Notes (Signed)
[place]    CSN: [id number] Arrival date & time: 09/11/19  1458      History   Chief Complaint Chief Complaint  Patient presents with  . covid test    HPI Steven Chase is a 33 y.o. male.   Steven Chase presents with complaints of episode of emesis today at work. He felt hot while at work, this was followed by emesis. No dizziness. No shortness of breath , no chest pain . No arm or jaw pain. No further nausea or vomiting. No abdominal pain. He feels well now. He was directed to go home. States he felt that he should be screened for covid. States he took his BP medications this morning without eating. Has once become dizzy, but otherwise no regularly dizziness. Has noted his bp has been lower recently. No headache. No vision changes. No known ill contacts. History  Of chf, obesity, ckd, cardiomyopathy, htn. He is on multiple medications for his blood pressure. Follows with cardiology and last seen on 08/24/19.     ROS per HPI, negative if not otherwise mentioned.      Past Medical History:  Diagnosis Date  . Hypertension   . Obesity   . OSA (obstructive sleep apnea) 11/05/2014   02/2015 -severe -AHI 106/h     Patient Active Problem List   Diagnosis Date Noted  . Acute exacerbation of CHF (congestive heart failure) ([place]) 07/15/2019  . Lipodystrophy 10/21/2018  . Obesity (BMI 30-39.9) 10/06/2018  . Chronic systolic heart failure ([place]) 02/17/2018  . Fatigue 02/17/2018  . Cardiomyopathy ([place]) 02/11/2018  . Hypertensive crisis 02/04/2018  . Chest pain 02/04/2018  . CKD (chronic kidney disease), stage II 02/04/2018  . Allergic rhinitis 01/21/2018  . Hyperglycemia 12/02/2017  . Hypokalemia 12/02/2017  . Cough 12/02/2017  . Patellar tendonitis of left knee 12/02/2017  . Preventative health care 12/10/2016  . Obesity 05/25/2016  . Lower leg edema 12/12/2014  . Essential hypertension 11/05/2014  . OSA (obstructive sleep apnea) 11/05/2014    Past  Surgical History:  Procedure Laterality Date  . HERNIA REPAIR    . RIGHT/LEFT HEART CATH AND CORONARY ANGIOGRAPHY N/A 02/07/2018   Procedure: RIGHT/LEFT HEART CATH AND CORONARY ANGIOGRAPHY;  Surgeon: Leonie Man, MD;  Location: [facility];  Service: Cardiovascular;  Laterality: N/A;       Home Medications    Prior to Admission medications   Medication Sig Start Date End Date Taking? Authorizing Provider  carvedilol (COREG) 25 MG tablet Take 1 tablet (25 mg total) by mouth 2 (two) times daily with a meal. 08/18/19   Bensimhon, Shaune Pascal, MD  digoxin (LANOXIN) 0.125 MG tablet Take 1 tablet (125 mcg total) by mouth daily. 08/18/19   Bensimhon, Shaune Pascal, MD  hydrALAZINE (APRESOLINE) 100 MG tablet Take 1 tablet (100 mg total) by mouth every 8 (eight) hours. 08/18/19   Bensimhon, Shaune Pascal, MD  ibuprofen (ADVIL) 600 MG tablet Take 1 tablet (600 mg total) by mouth every 6 (six) hours as needed. 08/31/19   Sharion Balloon, NP  isosorbide mononitrate (IMDUR) 60 MG 24 hr tablet Take 1 tablet (60 mg total) by mouth daily. 08/18/19   Bensimhon, Shaune Pascal, MD  potassium chloride SA (KLOR-CON) 20 MEQ tablet Take 3 tablets (60 mEq total) by mouth daily. 08/18/19   Bensimhon, Shaune Pascal, MD  sacubitril-valsartan (ENTRESTO) 49-51 MG Take 1 tablet by mouth 2 (two) times daily. 08/25/19   Clegg, Amy D, NP  spironolactone (ALDACTONE)  50 MG tablet Take 1 tablet (50 mg total) by mouth daily. 08/18/19   Bensimhon, Bevelyn Buckles, MD  torsemide (DEMADEX) 20 MG tablet Take 2 tablets (40 mg total) by mouth daily. 08/18/19   Bensimhon, Bevelyn Buckles, MD    Family History Family History  Problem Relation Age of Onset  . Hypertension Mother   . Heart disease Maternal Grandfather   . Hypertension Maternal Grandfather   . Hypertension Sister     Social History Social History   Tobacco Use  . Smoking status: Never Smoker  . Smokeless tobacco: Never Used  Substance Use Topics  . Alcohol use: No  . Drug use: No      Allergies   Patient has no known allergies.   Review of Systems Review of Systems   Physical Exam Triage Vital Signs ED Triage Vitals  Enc Vitals Group     BP 09/11/19 1639 (!) 97/56     Pulse Rate 09/11/19 1639 84     Resp 09/11/19 1639 16     Temp 09/11/19 1639 97.8 F (36.6 C)     Temp Source 09/11/19 1639 Oral     SpO2 09/11/19 1639 98 %     Weight --      Height --      Head Circumference --      Peak Flow --      Pain Score 09/11/19 1641 0     Pain Loc --      Pain Edu? --      Excl. in GC? --    No data found.  Updated Vital Signs BP (!) 97/56 (BP Location: Right Arm)   Pulse 84   Temp 97.8 F (36.6 C) (Oral)   Resp 16   SpO2 98%    Physical Exam Constitutional:      Appearance: He is well-developed.  Cardiovascular:     Rate and Rhythm: Normal rate.  Pulmonary:     Effort: Pulmonary effort is normal.  Abdominal:     Tenderness: There is no abdominal tenderness.  Skin:    General: Skin is warm and dry.  Neurological:     Mental Status: He is alert and oriented to person, place, and time.      UC Treatments / Results  Labs (all labs ordered are listed, but only abnormal results are displayed) Labs Reviewed  SARS CORONAVIRUS 2 (TAT 6-24 HRS)    EKG   Radiology No results found.  Procedures Procedures (including critical care time)  Medications Ordered in UC Medications - No data to display  Initial Impression / Assessment and Plan / UC Course  I have reviewed the triage vital signs and the nursing notes.  Pertinent labs & imaging results that were available during my care of the patient were reviewed by me and considered in my medical decision making (see chart for details).     1 episode of emesis earlier today. No further abdominal pain or nausea. No vomiting. bp noted in 90's/50's, which appears to be near baseline for him. No chest pain , no palpitations, no dizziness or light headedness. He took his medications on empty  stomach today. covid testing collected per patient request as well. Will notify of any positive findings and if any changes to treatment are needed.  Return precautions provided. Patient verbalized understanding and agreeable to plan.  Ambulatory out of clinic without difficulty.    Final Clinical Impressions(s) / UC Diagnoses   Final diagnoses:  Encounter for laboratory  testing for COVID-19 virus     Discharge Instructions     Starting Monday, Nov. 9, hours will change to 10 a.m. - 3 p.m. [place] site closed on Mondays.  [facility] COVID-19 Testing [facility] [phone] [address] Visitor's Entrance [place], [place] [address]   [facility] COVID-19 Testing - [facility] [phone] [address] [place], [place] [address]  [facility] COVID-19 Testing [facility] [phone] [address] Short Stay Entrance [place], [place] [address]   ED Prescriptions    None     PDMP not reviewed this encounter.   Georgetta Haber, NP 09/11/19 1700

## 2019-09-11 NOTE — Discharge Instructions (Signed)
Starting Monday, Nov. 9, hours will change to 10 a.m. - 3 p.m. [place] site closed on Mondays.  [place] COVID-19 Testing [facility] [place], [place] [address]   [address] [place], [place] [address]  [facility] [place] Stay Entrance [place], [place] [address]

## 2019-09-13 ENCOUNTER — Other Ambulatory Visit (HOSPITAL_COMMUNITY): Payer: Self-pay

## 2019-09-13 LAB — NOVEL CORONAVIRUS, NAA (HOSP ORDER, SEND-OUT TO REF LAB; TAT 18-24 HRS): SARS-CoV-2, NAA: NOT DETECTED

## 2019-09-13 MED FILL — POTASSIUM CHLORIDE CRYS ER: 20 | 30 days supply | Qty: 90 | Fill #1

## 2019-09-13 MED FILL — DIGOXIN 0.125 MG TABLET: 125 | 30 days supply | Qty: 30 | Fill #1

## 2019-09-13 NOTE — Progress Notes (Signed)
Paramedicine Encounter    Patient ID: Steven Chase, male    DOB: January 13, 1986, 33 y.o.   MRN: [id number]   Patient Care Team: Corwin Levins, MD as PCP - General (Internal Medicine) Nahser, Deloris Ping, MD as PCP - Cardiology (Cardiology) Burna Sis, LCSW as Social Worker (Licensed Clinical Social Worker)  Patient Active Problem List   Diagnosis Date Noted  . Acute exacerbation of CHF (congestive heart failure) (HCC) 07/15/2019  . Lipodystrophy 10/21/2018  . Obesity (BMI 30-39.9) 10/06/2018  . Chronic systolic heart failure (HCC) 02/17/2018  . Fatigue 02/17/2018  . Cardiomyopathy (HCC) 02/11/2018  . Hypertensive crisis 02/04/2018  . Chest pain 02/04/2018  . CKD (chronic kidney disease), stage II 02/04/2018  . Allergic rhinitis 01/21/2018  . Hyperglycemia 12/02/2017  . Hypokalemia 12/02/2017  . Cough 12/02/2017  . Patellar tendonitis of left knee 12/02/2017  . Preventative health care 12/10/2016  . Obesity 05/25/2016  . Lower leg edema 12/12/2014  . Essential hypertension 11/05/2014  . OSA (obstructive sleep apnea) 11/05/2014    Current Outpatient Medications:  .  carvedilol (COREG) 25 MG tablet, Take 1 tablet (25 mg total) by mouth 2 (two) times daily with a meal., Disp: 60 tablet, Rfl: 3 .  digoxin (LANOXIN) 0.125 MG tablet, Take 1 tablet (125 mcg total) by mouth daily., Disp: 30 tablet, Rfl: 3 .  hydrALAZINE (APRESOLINE) 100 MG tablet, Take 1 tablet (100 mg total) by mouth every 8 (eight) hours., Disp: 90 tablet, Rfl: 3 .  isosorbide mononitrate (IMDUR) 60 MG 24 hr tablet, Take 1 tablet (60 mg total) by mouth daily., Disp: 30 tablet, Rfl: 3 .  potassium chloride SA (KLOR-CON) 20 MEQ tablet, Take 3 tablets (60 mEq total) by mouth daily., Disp: 90 tablet, Rfl: 3 .  sacubitril-valsartan (ENTRESTO) 49-51 MG, Take 1 tablet by mouth 2 (two) times daily., Disp: 60 tablet, Rfl: 11 .  spironolactone (ALDACTONE) 50 MG tablet, Take 1 tablet (50 mg total) by mouth daily., Disp: 30  tablet, Rfl: 3 .  torsemide (DEMADEX) 20 MG tablet, Take 2 tablets (40 mg total) by mouth daily., Disp: 60 tablet, Rfl: 3 .  ibuprofen (ADVIL) 600 MG tablet, Take 1 tablet (600 mg total) by mouth every 6 (six) hours as needed., Disp: 30 tablet, Rfl: 0 No Known Allergies    Social History   Socioeconomic History  . Marital status: Single    Spouse name: Not on file  . Number of children: 1  . Years of education: 3  . Highest education level: Not on file  Occupational History  . Occupation: Therapist, music: [employer]  Social Needs  . Financial resource strain: Not very hard  . Food insecurity    Worry: Sometimes true    Inability: Sometimes true  . Transportation needs    Medical: No    Non-medical: No  Tobacco Use  . Smoking status: Never Smoker  . Smokeless tobacco: Never Used  Substance and Sexual Activity  . Alcohol use: No  . Drug use: No  . Sexual activity: Not on file  Lifestyle  . Physical activity    Days per week: Not on file    Minutes per session: Not on file  . Stress: Not on file  Relationships  . Social Musician on phone: Not on file    Gets together: Not on file    Attends religious service: Not on file    Active member of club or organization:  Not on file    Attends meetings of clubs or organizations: Not on file    Relationship status: Not on file  . Intimate partner violence    Fear of current or ex partner: Not on file    Emotionally abused: Not on file    Physically abused: Not on file    Forced sexual activity: Not on file  Other Topics Concern  . Not on file  Social History Narrative   Born and raised in [place], [place]. Currently live in a private residence with his mother. Fun: Play with his daughter.   Denies religious beliefs that would effect health care.     Physical Exam Cardiovascular:     Rate and Rhythm: Normal rate and regular rhythm.     Pulses: Normal pulses.  Pulmonary:     Effort: Pulmonary  effort is normal.     Breath sounds: Normal breath sounds.  Musculoskeletal: Normal range of motion.     Right lower leg: No edema.     Left lower leg: No edema.  Skin:    General: Skin is warm and dry.  Neurological:     Mental Status: He is alert and oriented to person, place, and time.  Psychiatric:        Mood and Affect: Mood normal.         Future Appointments  Date Time Provider [place]  10/11/2019  3:20 PM Biagio Borg, MD [facility]  11/10/2019  9:00 AM [facility] 1 [facility]  11/10/2019 10:00 AM Bensimhon, Shaune Pascal, MD [facility] None    BP 108/74 (BP Location: Left Arm, Patient Position: Sitting, Cuff Size: Normal)   Pulse 96   Resp 16   Wt 232 lb (105.2 kg)   SpO2 94%   BMI 37.45 kg/m   Weight yesterday- 235.2 lb Last visit weight- 245 lb  Mr Stiehl was seen at home today and reported feeling well. He denied chest pain, SOB, headache, dizziness, orthopnea, fever or cough over the past several days. He had one episode of nausea and vomiting last week and was seen at urgent care. He stated the symptoms resolved without intervention and believes it is a result of taking his medications on an empty stomach. He stated he has been compliant with his medications and his weight has been stable. His medications were verified and his pillbox was refilled. I will follow up next week unless it becomes necessary sooner.   Jacquiline Doe, EMT 09/13/19  ACTION: Home visit completed Next visit planned for 1 week

## 2019-09-17 IMAGING — DX DG CHEST 1V PORT
1 series · 1 of 1 positions shown · non-contrast
Comparison: 02/04/2018

CLINICAL DATA: Epigastric pain

EXAM:
PORTABLE CHEST 1 VIEW

[chest ap]
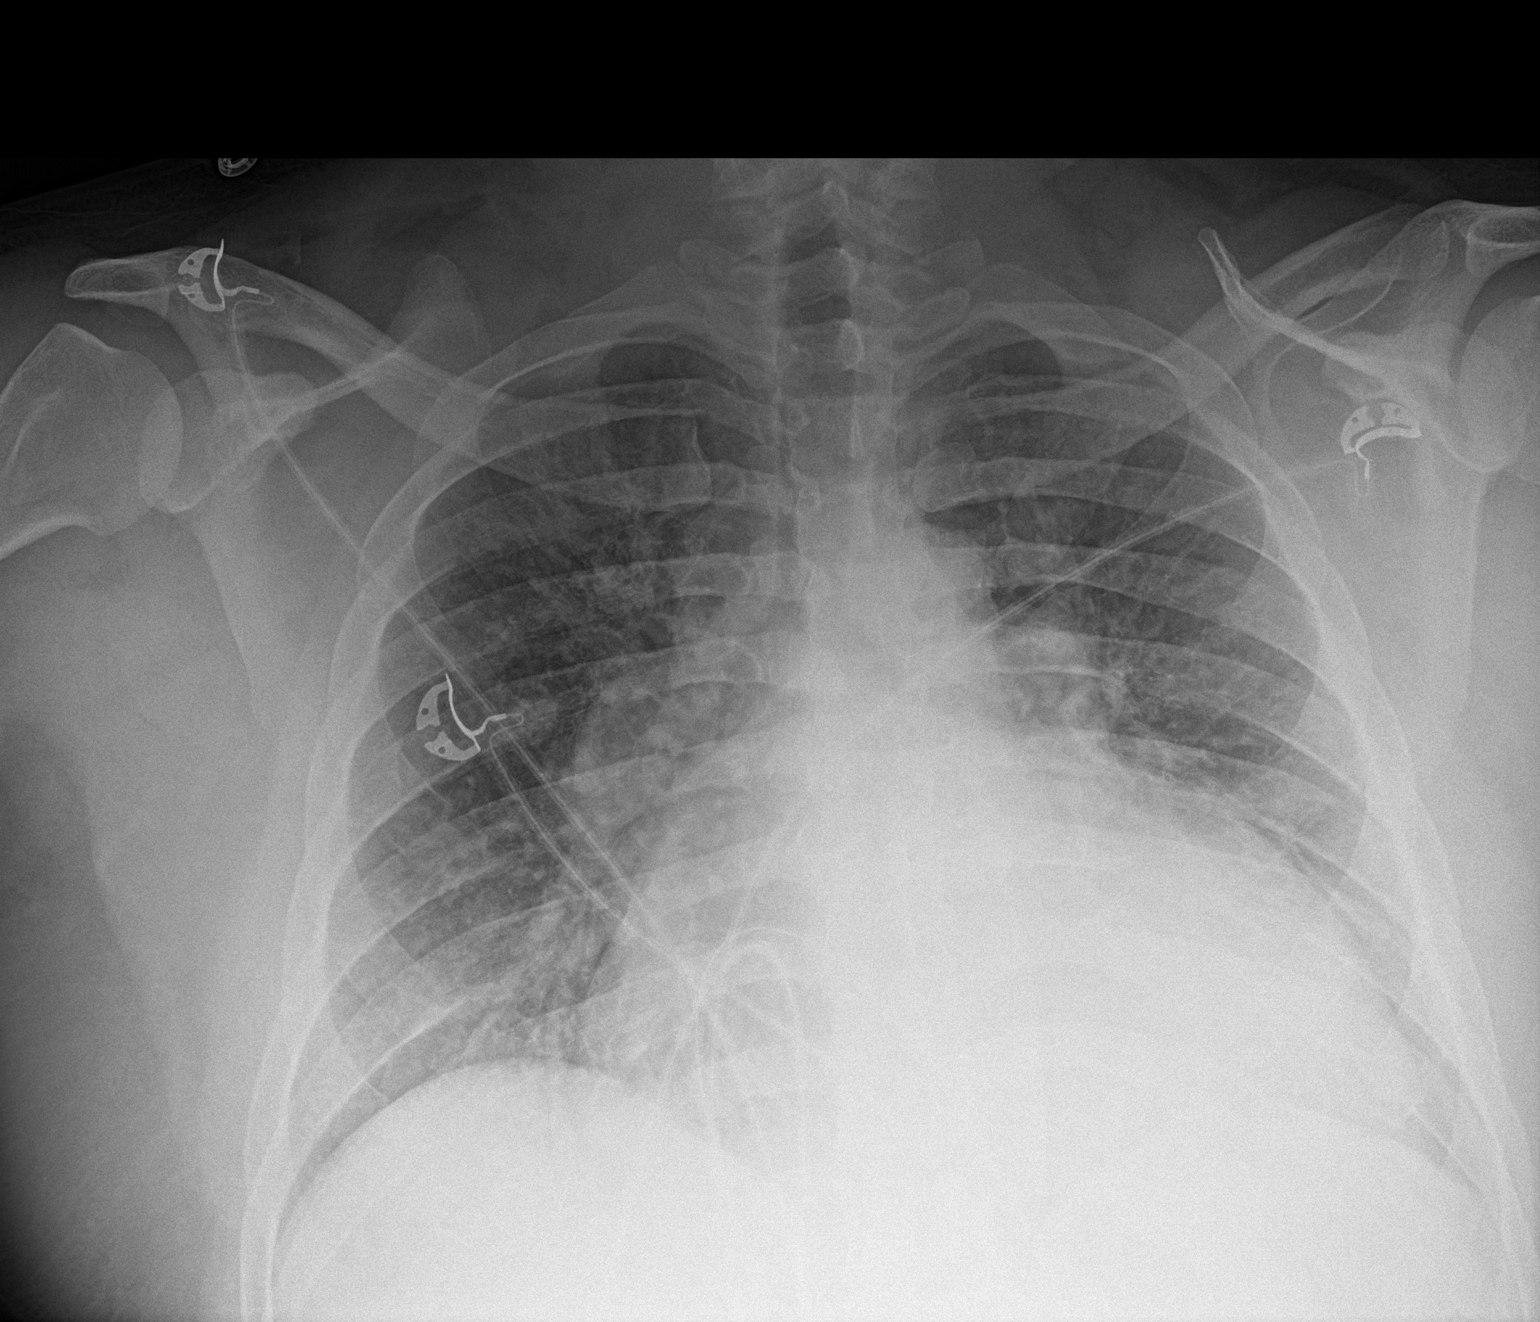

[1 of 1 positions shown; findings below may reference images not displayed]

FINDINGS: Cardiomegaly with vascular congestion. Mild hazy and interstitial
perihilar opacity. No pleural effusion. No focal consolidation or
pneumothorax.
IMPRESSION: Cardiomegaly with vascular congestion and mild hazy and interstitial
perihilar opacities suspect for minimal edema

## 2019-09-18 ENCOUNTER — Other Ambulatory Visit: Payer: Self-pay

## 2019-09-18 ENCOUNTER — Encounter (HOSPITAL_COMMUNITY): Payer: Self-pay | Admitting: Emergency Medicine

## 2019-09-18 ENCOUNTER — Ambulatory Visit (HOSPITAL_COMMUNITY)
Admission: EM | Admit: 2019-09-18 | Discharge: 2019-09-18 | Disposition: A | Discharge status: Home / Self Care | Payer: Self-pay | Attending: Internal Medicine | Admitting: Internal Medicine

## 2019-09-18 DIAGNOSIS — M25521 Pain in right elbow: Secondary | ICD-10-CM

## 2019-09-18 MED ORDER — PREDNISONE 20 MG PO TABS: 40.0000 mg | ORAL_TABLET | Freq: Every day | ORAL | 0 refills | Status: AC

## 2019-09-18 MED ORDER — NAPROXEN 500 MG PO TABS: 500.0000 mg | ORAL_TABLET | Freq: Two times a day (BID) | ORAL | 0 refills | Status: DC

## 2019-09-18 NOTE — Discharge Instructions (Signed)
Please take prednisone daily for 5 days with food Rest elbow Avoid aggravating motions  Follow up if not improving or worsening

## 2019-09-18 NOTE — ED Provider Notes (Signed)
[facility]    CSN: [id number] Arrival date & time: 09/18/19  1655      History   Chief Complaint Chief Complaint  Patient presents with  . Arm Pain    HPI Steven Chase is a 33 y.o. male history of hypertension, OSA, CHF, CKD stage II, presenting today for evaluation of elbow pain.  Patient states that this morning he woke up with right elbow pain.  States that he has pain with bending his elbow.  Denies any injury trauma or fall to his elbow.  He denies history of similar.  He denies history of gout.  He has not taken anything for his pain.  Patient is left-handed, denies repetitive use at work.  HPI  Past Medical History:  Diagnosis Date  . Hypertension   . Obesity   . OSA (obstructive sleep apnea) 11/05/2014   02/2015 -severe -AHI 106/h     Patient Active Problem List   Diagnosis Date Noted  . Acute exacerbation of CHF (congestive heart failure) (HCC) 07/15/2019  . Lipodystrophy 10/21/2018  . Obesity (BMI 30-39.9) 10/06/2018  . Chronic systolic heart failure (HCC) 02/17/2018  . Fatigue 02/17/2018  . Cardiomyopathy (HCC) 02/11/2018  . Hypertensive crisis 02/04/2018  . Chest pain 02/04/2018  . CKD (chronic kidney disease), stage II 02/04/2018  . Allergic rhinitis 01/21/2018  . Hyperglycemia 12/02/2017  . Hypokalemia 12/02/2017  . Cough 12/02/2017  . Patellar tendonitis of left knee 12/02/2017  . Preventative health care 12/10/2016  . Obesity 05/25/2016  . Lower leg edema 12/12/2014  . Essential hypertension 11/05/2014  . OSA (obstructive sleep apnea) 11/05/2014    Past Surgical History:  Procedure Laterality Date  . HERNIA REPAIR    . RIGHT/LEFT HEART CATH AND CORONARY ANGIOGRAPHY N/A 02/07/2018   Procedure: RIGHT/LEFT HEART CATH AND CORONARY ANGIOGRAPHY;  Surgeon: Marykay Lex, MD;  Location: [facility] INVASIVE CV LAB;  Service: Cardiovascular;  Laterality: N/A;       Home Medications    Prior to Admission medications   Medication Sig Start  Date End Date Taking? Authorizing Provider  carvedilol (COREG) 25 MG tablet Take 1 tablet (25 mg total) by mouth 2 (two) times daily with a meal. 08/18/19  Yes Bensimhon, Bevelyn Buckles, MD  digoxin (LANOXIN) 0.125 MG tablet Take 1 tablet (125 mcg total) by mouth daily. 08/18/19  Yes Bensimhon, Bevelyn Buckles, MD  hydrALAZINE (APRESOLINE) 100 MG tablet Take 1 tablet (100 mg total) by mouth every 8 (eight) hours. 08/18/19  Yes Bensimhon, Bevelyn Buckles, MD  isosorbide mononitrate (IMDUR) 60 MG 24 hr tablet Take 1 tablet (60 mg total) by mouth daily. 08/18/19  Yes Bensimhon, Bevelyn Buckles, MD  potassium chloride SA (KLOR-CON) 20 MEQ tablet Take 3 tablets (60 mEq total) by mouth daily. 08/18/19  Yes Bensimhon, Bevelyn Buckles, MD  sacubitril-valsartan (ENTRESTO) 49-51 MG Take 1 tablet by mouth 2 (two) times daily. 08/25/19  Yes Clegg, Amy D, NP  spironolactone (ALDACTONE) 50 MG tablet Take 1 tablet (50 mg total) by mouth daily. 08/18/19  Yes Bensimhon, Bevelyn Buckles, MD  torsemide (DEMADEX) 20 MG tablet Take 2 tablets (40 mg total) by mouth daily. 08/18/19  Yes Bensimhon, Bevelyn Buckles, MD  ibuprofen (ADVIL) 600 MG tablet Take 1 tablet (600 mg total) by mouth every 6 (six) hours as needed. 08/31/19   Mickie Bail, NP  predniSONE (DELTASONE) 20 MG tablet Take 2 tablets (40 mg total) by mouth daily with breakfast for 5 days. 09/18/19 09/23/19  , Junius Creamer,  PA-C    Family History Family History  Problem Relation Age of Onset  . Hypertension Mother   . Heart disease Maternal Grandfather   . Hypertension Maternal Grandfather   . Hypertension Sister     Social History Social History   Tobacco Use  . Smoking status: Never Smoker  . Smokeless tobacco: Never Used  Substance Use Topics  . Alcohol use: No  . Drug use: No     Allergies   Patient has no known allergies.   Review of Systems Review of Systems  Constitutional: Negative for fatigue and fever.  Eyes: Negative for redness, itching and visual disturbance.   Respiratory: Negative for shortness of breath.   Cardiovascular: Negative for chest pain and leg swelling.  Gastrointestinal: Negative for nausea and vomiting.  Musculoskeletal: Positive for arthralgias. Negative for myalgias.  Skin: Negative for color change, rash and wound.  Neurological: Negative for dizziness, syncope, weakness, light-headedness and headaches.     Physical Exam Triage Vital Signs ED Triage Vitals  Enc Vitals Group     BP 09/18/19 1819 108/63     Pulse Rate 09/18/19 1819 87     Resp 09/18/19 1819 18     Temp 09/18/19 1819 98.2 F (36.8 C)     Temp Source 09/18/19 1819 Oral     SpO2 09/18/19 1819 97 %     Weight --      Height --      Head Circumference --      Peak Flow --      Pain Score 09/18/19 1815 5     Pain Loc --      Pain Edu? --      Excl. in GC? --    No data found.  Updated Vital Signs BP 108/63 (BP Location: Left Arm) Comment (BP Location): large cuff  Pulse 87   Temp 98.2 F (36.8 C) (Oral)   Resp 18   SpO2 97%   Visual Acuity Right Eye Distance:   Left Eye Distance:   Bilateral Distance:    Right Eye Near:   Left Eye Near:    Bilateral Near:     Physical Exam Vitals signs and nursing note reviewed.  Constitutional:      Appearance: He is well-developed.     Comments: No acute distress  HENT:     Head: Normocephalic and atraumatic.     Nose: Nose normal.  Eyes:     Conjunctiva/sclera: Conjunctivae normal.  Neck:     Musculoskeletal: Neck supple.  Cardiovascular:     Rate and Rhythm: Normal rate.  Pulmonary:     Effort: Pulmonary effort is normal. No respiratory distress.  Abdominal:     General: There is no distension.  Musculoskeletal: Normal range of motion.     Comments: Right elbow: No obvious swelling erythema or discoloration, no overlying warmth tenderness to palpation to lateral and medial aspects, does not extend distally or more proximally, is able to move elbow, but cannot fully flex or fully extend elbow  due to pain has approximately 60 to 140 degrees of motion; reports pain with pronation of forearm, no pain with supination or wrist flexion or extension  Radial pulse 2+, cap refill brisk  Skin:    General: Skin is warm and dry.  Neurological:     Mental Status: He is alert and oriented to person, place, and time.      UC Treatments / Results  Labs (all labs ordered are listed, but only  abnormal results are displayed) Labs Reviewed - No data to display  EKG   Radiology No results found.  Procedures Procedures (including critical care time)  Medications Ordered in UC Medications - No data to display  Initial Impression / Assessment and Plan / UC Course  I have reviewed the triage vital signs and the nursing notes.  Pertinent labs & imaging results that were available during my care of the patient were reviewed by me and considered in my medical decision making (see chart for details).     Patient declines any injury multiple times, do not suspect acute bony abnormality.  Most likely inflammatory.  Possible tendinitis, will treat as such with anti-inflammatories.  Has elevated creatinine and history of CKD, will avoid NSAIDs.  Will place on prednisone and have take over the next 5 days.  No sign of septic joint at this time.  Continue to monitor,Discussed strict return precautions. Patient verbalized understanding and is agreeable with plan.  Final Clinical Impressions(s) / UC Diagnoses   Final diagnoses:  Right elbow pain     Discharge Instructions     Please take prednisone daily for 5 days with food Rest elbow Avoid aggravating motions  Follow up if not improving or worsening   ED Prescriptions    Medication Sig Dispense Auth. Provider   naproxen (NAPROSYN) 500 MG tablet  (Status: Discontinued) Take 1 tablet (500 mg total) by mouth 2 (two) times daily. 30 tablet ,  C, PA-C   predniSONE (DELTASONE) 20 MG tablet Take 2 tablets (40 mg total) by mouth  daily with breakfast for 5 days. 10 tablet , [place] C, PA-C     PDMP not reviewed this encounter.   Janith Lima, [place] 09/18/19 2125

## 2019-09-18 NOTE — ED Triage Notes (Addendum)
patient reports right arm pain involving right shoulder and elbow.  Patient denies injury.  Pain started today.  Has a history of the same about a month ago, but resolved on its own.  Right radial pulse is 2+, brisk cap refill to [facility]

## 2019-09-25 ENCOUNTER — Telehealth (HOSPITAL_COMMUNITY): Payer: Self-pay | Admitting: Licensed Clinical Social Worker

## 2019-09-25 NOTE — Telephone Encounter (Signed)
CSW reaching out to patient because they are currently receiving medications through [facility].  CSW called to discuss if pt is eligible for any alternative coverage options.  Pt had planned on starting his insurance back up that he had through his employer but has not done so at this time- CSW encouraged him to inquire with his work about this.  CSW also mentioned ACA insurance if pt is not eligible to get coverage through his employer- sent him an instructional sheet on how to look into insurance through the [facility]  CSW will continue to follow to assist as needed.  Jorge Ny, LCSW Clinical Social Worker [facility] Desk#: [phone] Cell#: [phone]

## 2019-09-27 ENCOUNTER — Other Ambulatory Visit (HOSPITAL_COMMUNITY): Payer: Self-pay

## 2019-09-27 NOTE — Progress Notes (Signed)
Paramedicine Encounter    Patient ID: Steven Chase, male    DOB: 07-18-1986, 33 y.o.   MRN: [id number]   Patient Care Team: Corwin Levins, MD as PCP - General (Internal Medicine) Nahser, Deloris Ping, MD as PCP - Cardiology (Cardiology) Burna Sis, LCSW as Social Worker (Licensed Clinical Social Worker)  Patient Active Problem List   Diagnosis Date Noted  . Acute exacerbation of CHF (congestive heart failure) (HCC) 07/15/2019  . Lipodystrophy 10/21/2018  . Obesity (BMI 30-39.9) 10/06/2018  . Chronic systolic heart failure (HCC) 02/17/2018  . Fatigue 02/17/2018  . Cardiomyopathy (HCC) 02/11/2018  . Hypertensive crisis 02/04/2018  . Chest pain 02/04/2018  . CKD (chronic kidney disease), stage II 02/04/2018  . Allergic rhinitis 01/21/2018  . Hyperglycemia 12/02/2017  . Hypokalemia 12/02/2017  . Cough 12/02/2017  . Patellar tendonitis of left knee 12/02/2017  . Preventative health care 12/10/2016  . Obesity 05/25/2016  . Lower leg edema 12/12/2014  . Essential hypertension 11/05/2014  . OSA (obstructive sleep apnea) 11/05/2014    Current Outpatient Medications:  .  carvedilol (COREG) 25 MG tablet, Take 1 tablet (25 mg total) by mouth 2 (two) times daily with a meal., Disp: 60 tablet, Rfl: 3 .  digoxin (LANOXIN) 0.125 MG tablet, Take 1 tablet (125 mcg total) by mouth daily., Disp: 30 tablet, Rfl: 3 .  hydrALAZINE (APRESOLINE) 100 MG tablet, Take 1 tablet (100 mg total) by mouth every 8 (eight) hours., Disp: 90 tablet, Rfl: 3 .  isosorbide mononitrate (IMDUR) 60 MG 24 hr tablet, Take 1 tablet (60 mg total) by mouth daily., Disp: 30 tablet, Rfl: 3 .  potassium chloride SA (KLOR-CON) 20 MEQ tablet, Take 3 tablets (60 mEq total) by mouth daily., Disp: 90 tablet, Rfl: 3 .  sacubitril-valsartan (ENTRESTO) 49-51 MG, Take 1 tablet by mouth 2 (two) times daily., Disp: 60 tablet, Rfl: 11 .  spironolactone (ALDACTONE) 50 MG tablet, Take 1 tablet (50 mg total) by mouth daily., Disp: 30  tablet, Rfl: 3 .  torsemide (DEMADEX) 20 MG tablet, Take 2 tablets (40 mg total) by mouth daily., Disp: 60 tablet, Rfl: 3 .  ibuprofen (ADVIL) 600 MG tablet, Take 1 tablet (600 mg total) by mouth every 6 (six) hours as needed. (Patient not taking: Reported on 09/27/2019), Disp: 30 tablet, Rfl: 0 No Known Allergies    Social History   Socioeconomic History  . Marital status: Single    Spouse name: Not on file  . Number of children: 1  . Years of education: 90  . Highest education level: Not on file  Occupational History  . Occupation: Therapist, music: [employer]  Social Needs  . Financial resource strain: Not very hard  . Food insecurity    Worry: Sometimes true    Inability: Sometimes true  . Transportation needs    Medical: No    Non-medical: No  Tobacco Use  . Smoking status: Never Smoker  . Smokeless tobacco: Never Used  Substance and Sexual Activity  . Alcohol use: No  . Drug use: No  . Sexual activity: Not on file  Lifestyle  . Physical activity    Days per week: Not on file    Minutes per session: Not on file  . Stress: Not on file  Relationships  . Social Musician on phone: Not on file    Gets together: Not on file    Attends religious service: Not on file  Active member of club or organization: Not on file    Attends meetings of clubs or organizations: Not on file    Relationship status: Not on file  . Intimate partner violence    Fear of current or ex partner: Not on file    Emotionally abused: Not on file    Physically abused: Not on file    Forced sexual activity: Not on file  Other Topics Concern  . Not on file  Social History Narrative   Born and raised in [place], [place]. Currently live in a private residence with his mother. Fun: Play with his daughter.   Denies religious beliefs that would effect health care.     Physical Exam Cardiovascular:     Rate and Rhythm: Normal rate and regular rhythm.     Pulses: Normal  pulses.  Pulmonary:     Effort: Pulmonary effort is normal.     Breath sounds: Normal breath sounds.  Musculoskeletal: Normal range of motion.     Right lower leg: No edema.     Left lower leg: No edema.  Skin:    General: Skin is warm and dry.  Neurological:     Mental Status: He is alert and oriented to person, place, and time.  Psychiatric:        Mood and Affect: Mood normal.         Future Appointments  Date Time Provider [place]  10/11/2019  3:20 PM Biagio Borg, MD [facility]  11/10/2019  9:00 AM [facility] OP 1 [facility]  11/10/2019 10:00 AM Bensimhon, Shaune Pascal, MD [facility] None    BP 110/80 (BP Location: Left Arm, Patient Position: Sitting, Cuff Size: Normal)   Pulse 98   Resp 16   Wt 235 lb (106.6 kg)   SpO2 94%   BMI 37.93 kg/m   Weight yesterday- 231 lb Last visit weight- 232 lb  Mr Hern was seen at home today and reported feeling well. He denied chest pain, SOB, headache, dizziness, orthopnea, fever or cough since our last visit. He stated he has been compliant with his medications over the past week and his weigh thas been stable. He had refilled his pillbox prior to my arrival so I check it for accuracy. I noted that he had put 50 mg of carvedilol in his morning slot in addition to the 25 mg in the evening. He also has spironolactone in his box in the morning and evening and on evening he had placed two entresto tablets in the evening slot. I brought these mistakes to his attention and made to corrections. I will follow up next week.   Jacquiline Doe, EMT 09/27/19  ACTION: Home visit completed Next visit planned for 1 week

## 2019-10-05 ENCOUNTER — Other Ambulatory Visit (HOSPITAL_COMMUNITY): Payer: Self-pay

## 2019-10-05 MED FILL — ISOSORBIDE MN ER 60 MG TAB: 60 | 30 days supply | Qty: 30 | Fill #2

## 2019-10-05 MED FILL — SPIRONOLACTONE 50 MG TABLET: 50 | 30 days supply | Qty: 30 | Fill #2

## 2019-10-05 NOTE — Progress Notes (Signed)
Paramedicine Encounter    Patient ID: Steven Chase, male    DOB: 01-23-1986, 33 y.o.   MRN: [id number]   Patient Care Team: Corwin Levins, MD as PCP - General (Internal Medicine) Nahser, Deloris Ping, MD as PCP - Cardiology (Cardiology) Burna Sis, LCSW as Social Worker (Licensed Clinical Social Worker)  Patient Active Problem List   Diagnosis Date Noted  . Acute exacerbation of CHF (congestive heart failure) (HCC) 07/15/2019  . Lipodystrophy 10/21/2018  . Obesity (BMI 30-39.9) 10/06/2018  . Chronic systolic heart failure (HCC) 02/17/2018  . Fatigue 02/17/2018  . Cardiomyopathy (HCC) 02/11/2018  . Hypertensive crisis 02/04/2018  . Chest pain 02/04/2018  . CKD (chronic kidney disease), stage II 02/04/2018  . Allergic rhinitis 01/21/2018  . Hyperglycemia 12/02/2017  . Hypokalemia 12/02/2017  . Cough 12/02/2017  . Patellar tendonitis of left knee 12/02/2017  . Preventative health care 12/10/2016  . Obesity 05/25/2016  . Lower leg edema 12/12/2014  . Essential hypertension 11/05/2014  . OSA (obstructive sleep apnea) 11/05/2014    Current Outpatient Medications:  .  carvedilol (COREG) 25 MG tablet, Take 1 tablet (25 mg total) by mouth 2 (two) times daily with a meal., Disp: 60 tablet, Rfl: 3 .  digoxin (LANOXIN) 0.125 MG tablet, Take 1 tablet (125 mcg total) by mouth daily., Disp: 30 tablet, Rfl: 3 .  hydrALAZINE (APRESOLINE) 100 MG tablet, Take 1 tablet (100 mg total) by mouth every 8 (eight) hours., Disp: 90 tablet, Rfl: 3 .  isosorbide mononitrate (IMDUR) 60 MG 24 hr tablet, Take 1 tablet (60 mg total) by mouth daily., Disp: 30 tablet, Rfl: 3 .  potassium chloride SA (KLOR-CON) 20 MEQ tablet, Take 3 tablets (60 mEq total) by mouth daily., Disp: 90 tablet, Rfl: 3 .  sacubitril-valsartan (ENTRESTO) 49-51 MG, Take 1 tablet by mouth 2 (two) times daily., Disp: 60 tablet, Rfl: 11 .  spironolactone (ALDACTONE) 50 MG tablet, Take 1 tablet (50 mg total) by mouth daily., Disp: 30  tablet, Rfl: 3 .  torsemide (DEMADEX) 20 MG tablet, Take 2 tablets (40 mg total) by mouth daily., Disp: 60 tablet, Rfl: 3 .  ibuprofen (ADVIL) 600 MG tablet, Take 1 tablet (600 mg total) by mouth every 6 (six) hours as needed. (Patient not taking: Reported on 09/27/2019), Disp: 30 tablet, Rfl: 0 No Known Allergies    Social History   Socioeconomic History  . Marital status: Single    Spouse name: Not on file  . Number of children: 1  . Years of education: 33  . Highest education level: Not on file  Occupational History  . Occupation: Therapist, music: [employer]  Social Needs  . Financial resource strain: Not very hard  . Food insecurity    Worry: Sometimes true    Inability: Sometimes true  . Transportation needs    Medical: No    Non-medical: No  Tobacco Use  . Smoking status: Never Smoker  . Smokeless tobacco: Never Used  Substance and Sexual Activity  . Alcohol use: No  . Drug use: No  . Sexual activity: Not on file  Lifestyle  . Physical activity    Days per week: Not on file    Minutes per session: Not on file  . Stress: Not on file  Relationships  . Social Musician on phone: Not on file    Gets together: Not on file    Attends religious service: Not on file  Active member of club or organization: Not on file    Attends meetings of clubs or organizations: Not on file    Relationship status: Not on file  . Intimate partner violence    Fear of current or ex partner: Not on file    Emotionally abused: Not on file    Physically abused: Not on file    Forced sexual activity: Not on file  Other Topics Concern  . Not on file  Social History Narrative   Born and raised in [place], [place]. Currently live in a private residence with his mother. Fun: Play with his daughter.   Denies religious beliefs that would effect health care.     Physical Exam Cardiovascular:     Rate and Rhythm: Normal rate and regular rhythm.     Pulses: Normal  pulses.  Pulmonary:     Effort: Pulmonary effort is normal.     Breath sounds: Normal breath sounds.  Musculoskeletal: Normal range of motion.     Right lower leg: No edema.     Left lower leg: No edema.  Skin:    General: Skin is warm and dry.     Capillary Refill: Capillary refill takes less than 2 seconds.  Neurological:     Mental Status: He is alert and oriented to person, place, and time.  Psychiatric:        Mood and Affect: Mood normal.         Future Appointments  Date Time Provider [place]  10/11/2019  3:20 PM Biagio Borg, MD [facility]  11/10/2019  9:00 AM [facility] OP 1 [facility]  11/10/2019 10:00 AM Bensimhon, Shaune Pascal, MD [facility] None    BP 100/82 (BP Location: Left Arm, Patient Position: Sitting, Cuff Size: Normal)   Pulse 84   Resp 16   Wt 231 lb 11.2 oz (105.1 kg)   SpO2 95%   BMI 37.40 kg/m   Weight yesterday- 234.6 lb Last visit weight- 235 lb  Steven Chase was seen at home today and reported feeling well. He denied chest pain, SOB, headache, dizziness, orthopnea, fever or cough since our last visit. He stated he has been compliant with his medications over the last week and his weight has been stable. His medications were verified and his pillbox was refilled. I will follow up next week unless it becomes necessary sooner.   Jacquiline Doe, EMT 10/05/19  ACTION: Home visit completed Next visit planned for 1 week

## 2019-10-11 ENCOUNTER — Ambulatory Visit (INDEPENDENT_AMBULATORY_CARE_PROVIDER_SITE_OTHER): Payer: Self-pay | Admitting: Internal Medicine

## 2019-10-11 ENCOUNTER — Encounter: Payer: Self-pay | Admitting: Internal Medicine

## 2019-10-11 ENCOUNTER — Other Ambulatory Visit: Payer: Self-pay

## 2019-10-11 VITALS — BP 116/68 | HR 100 | Temp 98.3°F | Ht 66.0 in | Wt 244.0 lb

## 2019-10-11 DIAGNOSIS — N182 Chronic kidney disease, stage 2 (mild): Secondary | ICD-10-CM

## 2019-10-11 DIAGNOSIS — Z0000 Encounter for general adult medical examination without abnormal findings: Secondary | ICD-10-CM

## 2019-10-11 DIAGNOSIS — I10 Essential (primary) hypertension: Secondary | ICD-10-CM

## 2019-10-11 DIAGNOSIS — R739 Hyperglycemia, unspecified: Secondary | ICD-10-CM

## 2019-10-11 LAB — POCT GLYCOSYLATED HEMOGLOBIN (HGB A1C): Hemoglobin A1C: 6.6 % — AB (ref 4.0–5.6)

## 2019-10-11 NOTE — Patient Instructions (Signed)
Your A1c was OK today (for the sugar)  Please continue all other medications as before, and refills have been done if requested.  Please have the pharmacy call with any other refills you may need.  Please continue your efforts at being more active, low cholesterol diet, and weight control.  You are otherwise up to date with prevention measures today.  Please keep your appointments with your specialists as you may have planned  Please return in 6 months, or sooner if needed

## 2019-10-11 NOTE — Assessment & Plan Note (Signed)
stable overall by history and exam, recent data reviewed with pt, and pt to continue medical treatment as before,  to f/u any worsening symptoms or concerns  

## 2019-10-11 NOTE — Assessment & Plan Note (Signed)

## 2019-10-11 NOTE — Progress Notes (Addendum)
Subjective:    Patient ID: Steven Chase, male    DOB: [birth year], 33 y.o.   MRN: [id number]  HPI  Here for wellness and f/u;  Overall doing ok;  Pt denies Chest pain, worsening SOB, DOE, wheezing, orthopnea, PND, worsening LE edema, palpitations, dizziness or syncope.  Pt denies neurological change such as new headache, facial or extremity weakness.  Pt denies polydipsia, polyuria, or low sugar symptoms. Pt states overall good compliance with treatment and medications, good tolerability, and has been trying to follow appropriate diet.  Pt denies worsening depressive symptoms, suicidal ideation or panic. No fever, night sweats, wt loss, loss of appetite, or other constitutional symptoms.  Pt states good ability with ADL's, has low fall risk, home safety reviewed and adequate, no other significant changes in hearing or vision, and not active with exercise.  No new complaints. Follows regularly with cards Past Medical History:  Diagnosis Date  . Hypertension   . Obesity   . OSA (obstructive sleep apnea) 11/05/2014   02/2015 -severe -AHI 106/h    Past Surgical History:  Procedure Laterality Date  . HERNIA REPAIR    . RIGHT/LEFT HEART CATH AND CORONARY ANGIOGRAPHY N/A 02/07/2018   Procedure: RIGHT/LEFT HEART CATH AND CORONARY ANGIOGRAPHY;  Surgeon: Leonie Man, MD;  Location: [facility];  Service: Cardiovascular;  Laterality: N/A;    reports that he has never smoked. He has never used smokeless tobacco. He reports that he does not drink alcohol or use drugs. family history includes Heart disease in his maternal grandfather; Hypertension in his maternal grandfather, mother, and sister. No Known Allergies Current Outpatient Medications on File Prior to Visit  Medication Sig Dispense Refill  . carvedilol (COREG) 25 MG tablet Take 1 tablet (25 mg total) by mouth 2 (two) times daily with a meal. 60 tablet 3  . digoxin (LANOXIN) 0.125 MG tablet Take 1 tablet (125 mcg total) by mouth daily.  30 tablet 3  . hydrALAZINE (APRESOLINE) 100 MG tablet Take 1 tablet (100 mg total) by mouth every 8 (eight) hours. 90 tablet 3  . ibuprofen (ADVIL) 600 MG tablet Take 1 tablet (600 mg total) by mouth every 6 (six) hours as needed. 30 tablet 0  . isosorbide mononitrate (IMDUR) 60 MG 24 hr tablet Take 1 tablet (60 mg total) by mouth daily. 30 tablet 3  . potassium chloride SA (KLOR-CON) 20 MEQ tablet Take 3 tablets (60 mEq total) by mouth daily. 90 tablet 3  . sacubitril-valsartan (ENTRESTO) 49-51 MG Take 1 tablet by mouth 2 (two) times daily. 60 tablet 11  . spironolactone (ALDACTONE) 50 MG tablet Take 1 tablet (50 mg total) by mouth daily. 30 tablet 3  . torsemide (DEMADEX) 20 MG tablet Take 2 tablets (40 mg total) by mouth daily. 60 tablet 3   No current facility-administered medications on file prior to visit.    Review of Systems Constitutional: Negative for other unusual diaphoresis, sweats, appetite or weight changes HENT: Negative for other worsening hearing loss, ear pain, facial swelling, mouth sores or neck stiffness.   Eyes: Negative for other worsening pain, redness or other visual disturbance.  Respiratory: Negative for other stridor or swelling Cardiovascular: Negative for other palpitations or other chest pain  Gastrointestinal: Negative for worsening diarrhea or loose stools, blood in stool, distention or other pain Genitourinary: Negative for hematuria, flank pain or other change in urine volume.  Musculoskeletal: Negative for myalgias or other joint swelling.  Skin: Negative for other color change,  or other wound or worsening drainage.  Neurological: Negative for other syncope or numbness. Hematological: Negative for other adenopathy or swelling Psychiatric/Behavioral: Negative for hallucinations, other worsening agitation, SI, self-injury, or new decreased concentration All otherwise neg per pt     Objective:   Physical Exam BP 116/68   Pulse 100   Temp 98.3 F (36.8  C) (Oral)   Ht 5\' 6"  (1.676 m)   Wt 244 lb (110.7 kg)   SpO2 95%   BMI 39.38 kg/m  VS noted,  Constitutional: Pt is oriented to person, place, and time. Appears well-developed and well-nourished, in no significant distress and comfortable Head: Normocephalic and atraumatic  Eyes: Conjunctivae and EOM are normal. Pupils are equal, round, and reactive to light Right Ear: External ear normal without discharge Left Ear: External ear normal without discharge Nose: Nose without discharge or deformity Mouth/Throat: Oropharynx is without other ulcerations and moist  Neck: Normal range of motion. Neck supple. No JVD present. No tracheal deviation present or significant neck LA or mass Cardiovascular: Normal rate, regular rhythm, normal heart sounds and intact distal pulses.   Pulmonary/Chest: WOB normal and breath sounds without rales or wheezing  Abdominal: Soft. Bowel sounds are normal. NT. No HSM  Musculoskeletal: Normal range of motion. Exhibits no edema Lymphadenopathy: Has no other cervical adenopathy.  Neurological: Pt is alert and oriented to person, place, and time. Pt has normal reflexes. No cranial nerve deficit. Motor grossly intact, Gait intact Skin: Skin is warm and dry. No rash noted or new ulcerations Psychiatric:  Has normal mood and affect. Behavior is normal without agitation All otherwise neg per pt  Lab Results  Component Value Date   WBC 8.0 07/18/2019   HGB 17.8 (H) 07/18/2019   HCT 56.1 (H) 07/18/2019   PLT 242 07/18/2019   GLUCOSE 97 08/11/2019   CHOL 230 (H) 02/06/2018   TRIG 209 (H) 02/06/2018   HDL 37 (L) 02/06/2018   LDLCALC 151 (H) 02/06/2018   ALT 22 07/15/2019   AST 28 07/15/2019   NA 137 08/11/2019   K 4.6 08/11/2019   CL 104 08/11/2019   CREATININE 1.49 (H) 08/11/2019   BUN 28 (H) 08/11/2019   CO2 21 (L) 08/11/2019   TSH 1.953 02/06/2018   INR 1.24 02/06/2018   HGBA1C 6.6 (A) 10/11/2019   MICROALBUR 0.42 08/24/2008    POCT HgB A1C Order:  08/26/2008 Status:  Final result Visible to patient:  No (scheduled for 10/17/2019 4:17 PM) Dx:  Hyperglycemia  Ref Range & Units 1 d ago  (10/11/19) 2 mo ago  (07/19/19) 1 yr ago  (10/06/18) 1 yr ago  (02/06/18) 2 yr ago  (01/14/17)  Hemoglobin A1C 4.0 - 5.6 % 6.6Abnormal   6.0High  R, CM  5.6  5.5 R, CM  6.0 R           Assessment & Plan:

## 2019-10-13 ENCOUNTER — Other Ambulatory Visit (HOSPITAL_COMMUNITY): Payer: Self-pay | Admitting: *Deleted

## 2019-10-13 ENCOUNTER — Other Ambulatory Visit (HOSPITAL_COMMUNITY): Payer: Self-pay

## 2019-10-13 MED ORDER — HYDRALAZINE HCL 100 MG PO TABS: 100.0000 mg | ORAL_TABLET | Freq: Three times a day (TID) | ORAL | 3 refills | Status: DC

## 2019-10-13 MED FILL — POTASSIUM CHLORIDE CRYS ER: 20 | 30 days supply | Qty: 90 | Fill #2

## 2019-10-13 MED FILL — DIGOXIN 0.125 MG TABLET: 125 | 30 days supply | Qty: 30 | Fill #2

## 2019-10-13 MED FILL — hydrALAZINE HCL 100 MG TABS: 100 | 30 days supply | Qty: 90 | Fill #0

## 2019-10-13 NOTE — Progress Notes (Signed)
Paramedicine Encounter    Patient ID: Steven Chase, male    DOB: 11-01-1986, 33 y.o.   MRN: [id number]   Patient Care Team: Corwin Levins, MD as PCP - General (Internal Medicine) Nahser, Deloris Ping, MD as PCP - Cardiology (Cardiology) Burna Sis, LCSW as Social Worker (Licensed Clinical Social Worker)  Patient Active Problem List   Diagnosis Date Noted  . Acute exacerbation of CHF (congestive heart failure) (HCC) 07/15/2019  . Lipodystrophy 10/21/2018  . Obesity (BMI 30-39.9) 10/06/2018  . Chronic systolic heart failure (HCC) 02/17/2018  . Fatigue 02/17/2018  . Cardiomyopathy (HCC) 02/11/2018  . Hypertensive crisis 02/04/2018  . Chest pain 02/04/2018  . CKD (chronic kidney disease), stage II 02/04/2018  . Allergic rhinitis 01/21/2018  . Hyperglycemia 12/02/2017  . Hypokalemia 12/02/2017  . Cough 12/02/2017  . Patellar tendonitis of left knee 12/02/2017  . Preventative health care 12/10/2016  . Obesity 05/25/2016  . Lower leg edema 12/12/2014  . Essential hypertension 11/05/2014  . OSA (obstructive sleep apnea) 11/05/2014    Current Outpatient Medications:  .  carvedilol (COREG) 25 MG tablet, Take 1 tablet (25 mg total) by mouth 2 (two) times daily with a meal., Disp: 60 tablet, Rfl: 3 .  digoxin (LANOXIN) 0.125 MG tablet, Take 1 tablet (125 mcg total) by mouth daily., Disp: 30 tablet, Rfl: 3 .  hydrALAZINE (APRESOLINE) 100 MG tablet, Take 1 tablet (100 mg total) by mouth every 8 (eight) hours., Disp: 90 tablet, Rfl: 3 .  isosorbide mononitrate (IMDUR) 60 MG 24 hr tablet, Take 1 tablet (60 mg total) by mouth daily., Disp: 30 tablet, Rfl: 3 .  potassium chloride SA (KLOR-CON) 20 MEQ tablet, Take 3 tablets (60 mEq total) by mouth daily., Disp: 90 tablet, Rfl: 3 .  sacubitril-valsartan (ENTRESTO) 49-51 MG, Take 1 tablet by mouth 2 (two) times daily., Disp: 60 tablet, Rfl: 11 .  spironolactone (ALDACTONE) 50 MG tablet, Take 1 tablet (50 mg total) by mouth daily., Disp: 30  tablet, Rfl: 3 .  torsemide (DEMADEX) 20 MG tablet, Take 2 tablets (40 mg total) by mouth daily., Disp: 60 tablet, Rfl: 3 .  ibuprofen (ADVIL) 600 MG tablet, Take 1 tablet (600 mg total) by mouth every 6 (six) hours as needed. (Patient not taking: Reported on 10/13/2019), Disp: 30 tablet, Rfl: 0 No Known Allergies    Social History   Socioeconomic History  . Marital status: Single    Spouse name: Not on file  . Number of children: 1  . Years of education: 7  . Highest education level: Not on file  Occupational History  . 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 Smoking status: Never Smoker  . Smokeless tobacco: Never Used  Substance and Sexual Activity  . Alcohol use: No  . Drug use: No  . Sexual activity: Not on file  Other Topics Concern  . Not on file  Social History Narrative   Born and raised in [place], [place]. Currently live in a private residence with his mother. Fun: Play with his daughter.   Denies religious beliefs that would effect health care.    Social Determinants of Health   Financial Resource Strain: Low Risk   . Difficulty of Paying Living Expenses: Not very hard  Food Insecurity: Food Insecurity Present  . Worried About Programme researcher, broadcasting/film/video in the Last Year: Sometimes true  . Ran Out of Food in the Last Year: Sometimes true  Transportation Needs: No  Transportation Needs  . Lack of Transportation (Medical): No  . Lack of Transportation (Non-Medical): No  Physical Activity:   . Days of Exercise per Week: Not on file  . Minutes of Exercise per Session: Not on file  Stress:   . Feeling of Stress : Not on file  Social Connections:   . Frequency of Communication with Friends and Family: Not on file  . Frequency of Social Gatherings with Friends and Family: Not on file  . Attends Religious Services: Not on file  . Active Member of Clubs or Organizations: Not on file  . Attends Archivist Meetings: Not on file  .  Marital Status: Not on file  Intimate Partner Violence:   . Fear of Current or Ex-Partner: Not on file  . Emotionally Abused: Not on file  . Physically Abused: Not on file  . Sexually Abused: Not on file    Physical Exam Cardiovascular:     Rate and Rhythm: Normal rate and regular rhythm.     Pulses: Normal pulses.  Pulmonary:     Effort: Pulmonary effort is normal.     Breath sounds: Normal breath sounds.  Musculoskeletal:     Right lower leg: No edema.     Left lower leg: No edema.  Skin:    General: Skin is warm and dry.     Capillary Refill: Capillary refill takes less than 2 seconds.  Neurological:     Mental Status: He is alert and oriented to person, place, and time.  Psychiatric:        Mood and Affect: Mood normal.         Future Appointments  Date Time Provider [place]  11/10/2019  9:00 AM [facility] OP 1 [facility]  11/10/2019 10:00 AM Bensimhon, Shaune Pascal, MD [facility] None  04/10/2020  3:00 PM Biagio Borg, MD [facility]    BP 98/70 (BP Location: Left Arm, Patient Position: Sitting, Cuff Size: Normal)   Pulse 90   Resp 16   Wt 233 lb 9.6 oz (106 kg)   SpO2 95%   BMI 37.70 kg/m   Weight yesterday- 234 lb Last visit weight- 244 lb  [name] was seen at home today and reported feeling well. He denied chest pain, SOB, headache, dizziness, orthopnea, fever or cough since our last visit. He stated he has been compliant with his medications and  His weight has been stable. His medications were verified and his pillbox was refilled. I will follow up next week.   Jacquiline Doe, EMT 10/13/19  ACTION: Home visit completed Next visit planned for 1 week

## 2019-10-19 ENCOUNTER — Encounter (HOSPITAL_COMMUNITY): Payer: Self-pay | Admitting: Emergency Medicine

## 2019-10-19 ENCOUNTER — Ambulatory Visit (HOSPITAL_COMMUNITY)
Admission: EM | Admit: 2019-10-19 | Discharge: 2019-10-19 | Disposition: A | Discharge status: Home / Self Care | Payer: Self-pay | Attending: Internal Medicine | Admitting: Internal Medicine

## 2019-10-19 ENCOUNTER — Other Ambulatory Visit (HOSPITAL_COMMUNITY): Payer: Self-pay

## 2019-10-19 ENCOUNTER — Other Ambulatory Visit: Payer: Self-pay

## 2019-10-19 DIAGNOSIS — R369 Urethral discharge, unspecified: Secondary | ICD-10-CM | POA: Insufficient documentation

## 2019-10-19 LAB — HIV ANTIBODY (ROUTINE TESTING W REFLEX): HIV Screen 4th Generation wRfx: NONREACTIVE

## 2019-10-19 MED ORDER — AZITHROMYCIN 250 MG PO TABS
1000.0000 mg | ORAL_TABLET | Freq: Once | ORAL | Status: AC
  Administered 2019-10-19: 1000 mg via ORAL

## 2019-10-19 MED ORDER — LIDOCAINE HCL (PF) 1 % IJ SOLN
INTRAMUSCULAR | Status: AC
  Filled 2019-10-19: qty 2

## 2019-10-19 MED ORDER — AZITHROMYCIN 250 MG PO TABS
ORAL_TABLET | ORAL | Status: AC
  Filled 2019-10-19: qty 4

## 2019-10-19 MED ORDER — CEFTRIAXONE SODIUM 250 MG IJ SOLR
INTRAMUSCULAR | Status: AC
  Filled 2019-10-19: qty 250

## 2019-10-19 MED ORDER — CEFTRIAXONE SODIUM 250 MG IJ SOLR
250.0000 mg | Freq: Once | INTRAMUSCULAR | Status: AC
  Administered 2019-10-19: 14:00:00 250 mg via INTRAMUSCULAR

## 2019-10-19 NOTE — Discharge Instructions (Signed)
Avoid sexual intercourse over the next 7 days Please refer to safe sex practice information attached to the discharge paperwork.

## 2019-10-19 NOTE — ED Triage Notes (Signed)
Pt here for STD check... reports dysuria associated w/white/yellowish penile d/c  Denies fevers  Sexually active w/occasional condom use.   A&O X4... NAD.Marland Kitchen. ambulatory

## 2019-10-19 NOTE — ED Provider Notes (Signed)
[facility]    CSN: [id number] Arrival date & time: 10/19/19  1243      History   Chief Complaint Chief Complaint  Patient presents with  . Exposure to STD    HPI Steven Chase is a 33 y.o. male with a history of hypertension-controlled comes to urgent care requesting STD screen.  Patient has had unprotected sexual intercourse with several people.  He started experiencing some dysuria with a yellowish penile discharge over the past couple of days.  He denies any testicular pain.  No groin pain.  No ulceration on the genitals.  No rash.   He has not tried any medications prior to now.  No nausea or vomiting.  HPI  Past Medical History:  Diagnosis Date  . Hypertension   . Obesity   . OSA (obstructive sleep apnea) 11/05/2014   02/2015 -severe -AHI 106/h     Patient Active Problem List   Diagnosis Date Noted  . Acute exacerbation of CHF (congestive heart failure) (HCC) 07/15/2019  . Lipodystrophy 10/21/2018  . Obesity (BMI 30-39.9) 10/06/2018  . Chronic systolic heart failure (HCC) 02/17/2018  . Fatigue 02/17/2018  . Cardiomyopathy (HCC) 02/11/2018  . Hypertensive crisis 02/04/2018  . Chest pain 02/04/2018  . CKD (chronic kidney disease), stage II 02/04/2018  . Allergic rhinitis 01/21/2018  . Hyperglycemia 12/02/2017  . Hypokalemia 12/02/2017  . Cough 12/02/2017  . Patellar tendonitis of left knee 12/02/2017  . Preventative health care 12/10/2016  . Obesity 05/25/2016  . Lower leg edema 12/12/2014  . Essential hypertension 11/05/2014  . OSA (obstructive sleep apnea) 11/05/2014    Past Surgical History:  Procedure Laterality Date  . HERNIA REPAIR    . RIGHT/LEFT HEART CATH AND CORONARY ANGIOGRAPHY N/A 02/07/2018   Procedure: RIGHT/LEFT HEART CATH AND CORONARY ANGIOGRAPHY;  Surgeon: Marykay Lex, MD;  Location: [facility] INVASIVE CV LAB;  Service: Cardiovascular;  Laterality: N/A;       Home Medications    Prior to Admission medications   Medication  Sig Start Date End Date Taking? Authorizing Provider  carvedilol (COREG) 25 MG tablet Take 1 tablet (25 mg total) by mouth 2 (two) times daily with a meal. 08/18/19   Bensimhon, Bevelyn Buckles, MD  digoxin (LANOXIN) 0.125 MG tablet Take 1 tablet (125 mcg total) by mouth daily. 08/18/19   Bensimhon, Bevelyn Buckles, MD  hydrALAZINE (APRESOLINE) 100 MG tablet Take 1 tablet (100 mg total) by mouth every 8 (eight) hours. 10/13/19   Bensimhon, Bevelyn Buckles, MD  ibuprofen (ADVIL) 600 MG tablet Take 1 tablet (600 mg total) by mouth every 6 (six) hours as needed. Patient not taking: Reported on 10/13/2019 08/31/19   Mickie Bail, NP  isosorbide mononitrate (IMDUR) 60 MG 24 hr tablet Take 1 tablet (60 mg total) by mouth daily. 08/18/19   Bensimhon, Bevelyn Buckles, MD  potassium chloride SA (KLOR-CON) 20 MEQ tablet Take 3 tablets (60 mEq total) by mouth daily. 08/18/19   Bensimhon, Bevelyn Buckles, MD  sacubitril-valsartan (ENTRESTO) 49-51 MG Take 1 tablet by mouth 2 (two) times daily. 08/25/19   Clegg, Amy D, NP  spironolactone (ALDACTONE) 50 MG tablet Take 1 tablet (50 mg total) by mouth daily. 08/18/19   Bensimhon, Bevelyn Buckles, MD  torsemide (DEMADEX) 20 MG tablet Take 2 tablets (40 mg total) by mouth daily. 08/18/19   Bensimhon, Bevelyn Buckles, MD    Family History Family History  Problem Relation Age of Onset  . Hypertension Mother   . Heart disease Maternal  Grandfather   . Hypertension Maternal Grandfather   . Hypertension Sister     Social History Social History   Tobacco Use  . Smoking status: Never Smoker  . Smokeless tobacco: Never Used  Substance Use Topics  . Alcohol use: No  . Drug use: No     Allergies   Patient has no known allergies.   Review of Systems Review of Systems  Constitutional: Negative.  Negative for activity change and chills.  HENT: Negative.  Negative for postnasal drip and sinus pressure.   Respiratory: Negative.   Gastrointestinal: Negative.  Negative for abdominal pain and nausea.    Genitourinary: Positive for discharge, dysuria and penile pain. Negative for frequency, genital sores, hematuria, penile swelling, scrotal swelling, testicular pain and urgency.  Musculoskeletal: Negative for arthralgias, joint swelling and myalgias.  Skin: Negative.  Negative for color change, rash and wound.  Neurological: Negative.  Negative for dizziness, syncope and light-headedness.     Physical Exam Triage Vital Signs ED Triage Vitals  Enc Vitals Group     BP 10/19/19 1300 104/65     Pulse Rate 10/19/19 1300 86     Resp 10/19/19 1300 16     Temp 10/19/19 1300 97.8 F (36.6 C)     Temp Source 10/19/19 1300 Oral     SpO2 10/19/19 1300 94 %     Weight --      Height --      Head Circumference --      Peak Flow --      Pain Score 10/19/19 1302 0     Pain Loc --      Pain Edu? --      Excl. in GC? --    No data found.  Updated Vital Signs BP 104/65 (BP Location: Left Arm)   Pulse 86   Temp 97.8 F (36.6 C) (Oral)   Resp 16   SpO2 94%   Visual Acuity Right Eye Distance:   Left Eye Distance:   Bilateral Distance:    Right Eye Near:   Left Eye Near:    Bilateral Near:     Physical Exam Vitals and nursing note reviewed.  Constitutional:      General: He is not in acute distress.    Appearance: Normal appearance. He is not ill-appearing.  Cardiovascular:     Rate and Rhythm: Normal rate and regular rhythm.     Pulses: Normal pulses.     Heart sounds: Normal heart sounds.  Pulmonary:     Effort: Pulmonary effort is normal.     Breath sounds: Normal breath sounds.  Abdominal:     General: Bowel sounds are normal.     Palpations: Abdomen is soft. There is no mass.  Genitourinary:    Testes: Normal.     Comments: Yellowish penile discharge noted.  Testis is not tender or swollen.  No inguinal swelling or discomfort. Musculoskeletal:        General: No swelling or tenderness. Normal range of motion.  Skin:    General: Skin is warm and dry.     Capillary  Refill: Capillary refill takes less than 2 seconds.  Neurological:     General: No focal deficit present.     Mental Status: He is alert.      UC Treatments / Results  Labs (all labs ordered are listed, but only abnormal results are displayed) Labs Reviewed  HIV ANTIBODY (ROUTINE TESTING W REFLEX)  RPR  CYTOLOGY, (ORAL, ANAL, URETHRAL) ANCILLARY  ONLY    EKG   Radiology No results found.  Procedures Procedures (including critical care time)  Medications Ordered in UC Medications  cefTRIAXone (ROCEPHIN) injection 250 mg (250 mg Intramuscular Given 10/19/19 1334)  azithromycin (ZITHROMAX) tablet 1,000 mg (1,000 mg Oral Given 10/19/19 1334)    Initial Impression / Assessment and Plan / UC Course  I have reviewed the triage vital signs and the nursing notes.  Pertinent labs & imaging results that were available during my care of the patient were reviewed by me and considered in my medical decision making (see chart for details).     1.  Penile discharge-STD Azithromycin 1 g orally x1 dose Ceftriaxone 250 mg IM x1 dose Urethral swab for GC/chlamydia/trichomonas HIV/RPR If work-up is remarkable, we will reach out to patient to amend his treatment regimen  Final Clinical Impressions(s) / UC Diagnoses   Final diagnoses:  Penile discharge     Discharge Instructions     Avoid sexual intercourse over the next 7 days Please refer to safe sex practice information attached to the discharge paperwork.    ED Prescriptions    None     PDMP not reviewed this encounter.   Chase Picket, MD 10/22/19 Joen Laura

## 2019-10-19 NOTE — Progress Notes (Signed)
Paramedicine Encounter    Patient ID: Steven Chase, male    DOB: 11-01-1986, 33 y.o.   MRN: [id number]   Patient Care Team: Steven Levins, MD as PCP - General (Internal Medicine) Nahser, Deloris Ping, MD as PCP - Cardiology (Cardiology) Steven Sis, LCSW as Social Worker (Licensed Clinical Social Worker)  Patient Active Problem List   Diagnosis Date Noted  . Acute exacerbation of CHF (congestive heart failure) (HCC) 07/15/2019  . Lipodystrophy 10/21/2018  . Obesity (BMI 30-39.9) 10/06/2018  . Chronic systolic heart failure (HCC) 02/17/2018  . Fatigue 02/17/2018  . Cardiomyopathy (HCC) 02/11/2018  . Hypertensive crisis 02/04/2018  . Chest pain 02/04/2018  . CKD (chronic kidney disease), stage II 02/04/2018  . Allergic rhinitis 01/21/2018  . Hyperglycemia 12/02/2017  . Hypokalemia 12/02/2017  . Cough 12/02/2017  . Patellar tendonitis of left knee 12/02/2017  . Preventative health care 12/10/2016  . Obesity 05/25/2016  . Lower leg edema 12/12/2014  . Essential hypertension 11/05/2014  . OSA (obstructive sleep apnea) 11/05/2014    Current Outpatient Medications:  .  carvedilol (COREG) 25 MG tablet, Take 1 tablet (25 mg total) by mouth 2 (two) times daily with a meal., Disp: 60 tablet, Rfl: 3 .  digoxin (LANOXIN) 0.125 MG tablet, Take 1 tablet (125 mcg total) by mouth daily., Disp: 30 tablet, Rfl: 3 .  hydrALAZINE (APRESOLINE) 100 MG tablet, Take 1 tablet (100 mg total) by mouth every 8 (eight) hours., Disp: 90 tablet, Rfl: 3 .  isosorbide mononitrate (IMDUR) 60 MG 24 hr tablet, Take 1 tablet (60 mg total) by mouth daily., Disp: 30 tablet, Rfl: 3 .  potassium chloride SA (KLOR-CON) 20 MEQ tablet, Take 3 tablets (60 mEq total) by mouth daily., Disp: 90 tablet, Rfl: 3 .  sacubitril-valsartan (ENTRESTO) 49-51 MG, Take 1 tablet by mouth 2 (two) times daily., Disp: 60 tablet, Rfl: 11 .  spironolactone (ALDACTONE) 50 MG tablet, Take 1 tablet (50 mg total) by mouth daily., Disp: 30  tablet, Rfl: 3 .  torsemide (DEMADEX) 20 MG tablet, Take 2 tablets (40 mg total) by mouth daily., Disp: 60 tablet, Rfl: 3 .  ibuprofen (ADVIL) 600 MG tablet, Take 1 tablet (600 mg total) by mouth every 6 (six) hours as needed. (Patient not taking: Reported on 10/13/2019), Disp: 30 tablet, Rfl: 0 No Known Allergies    Social History   Socioeconomic History  . Marital status: Single    Spouse name: Not on file  . Number of children: 1  . Years of education: 7  . Highest education level: Not on file  Occupational History  . 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 Smoking status: Never Smoker  . Smokeless tobacco: Never Used  Substance and Sexual Activity  . Alcohol use: No  . Drug use: No  . Sexual activity: Not on file  Other Topics Concern  . Not on file  Social History Narrative   Born and raised in [place], [place]. Currently live in a private residence with his mother. Fun: Play with his daughter.   Denies religious beliefs that would effect health care.    Social Determinants of Health   Financial Resource Strain: Low Risk   . Difficulty of Paying Living Expenses: Not very hard  Food Insecurity: Food Insecurity Present  . Worried About Programme researcher, broadcasting/film/video in the Last Year: Sometimes true  . Ran Out of Food in the Last Year: Sometimes true  Transportation Needs: No  Transportation Needs  . Lack of Transportation (Medical): No  . Lack of Transportation (Non-Medical): No  Physical Activity:   . Days of Exercise per Week: Not on file  . Minutes of Exercise per Session: Not on file  Stress:   . Feeling of Stress : Not on file  Social Connections:   . Frequency of Communication with Friends and Family: Not on file  . Frequency of Social Gatherings with Friends and Family: Not on file  . Attends Religious Services: Not on file  . Active Member of Clubs or Organizations: Not on file  . Attends Archivist Meetings: Not on file  .  Marital Status: Not on file  Intimate Partner Violence:   . Fear of Current or Ex-Partner: Not on file  . Emotionally Abused: Not on file  . Physically Abused: Not on file  . Sexually Abused: Not on file    Physical Exam Cardiovascular:     Rate and Rhythm: Normal rate and regular rhythm.     Pulses: Normal pulses.  Pulmonary:     Effort: Pulmonary effort is normal.     Breath sounds: Normal breath sounds.  Musculoskeletal:        General: Normal range of motion.     Right lower leg: No edema.     Left lower leg: No edema.  Skin:    General: Skin is warm and dry.     Capillary Refill: Capillary refill takes less than 2 seconds.  Neurological:     Mental Status: He is alert and oriented to person, place, and time.  Psychiatric:        Mood and Affect: Mood normal.         Future Appointments  Date Time Provider [name]  11/10/2019  9:00 AM [facility] 1 [facility]  11/10/2019 10:00 AM Bensimhon, Shaune Pascal, MD [facility] None  04/10/2020  3:00 PM Biagio Borg, MD [facility]    BP (!) 96/50 (BP Location: Left Arm, Patient Position: Sitting, Cuff Size: Normal)   Pulse 90   Wt 233 lb 8 oz (105.9 kg)   SpO2 96%   BMI 37.69 kg/m   Weight yesterday- 235 lb Last visit weight- 233 lb  Steven Chase was seen at home today and reported feeling generally well. He denied chest pain, SOB, headache, dizziness, orthopnea, fever or cough since our last visit. He stated he has been compliant with his medications over the past week and his weight has been stable. His medications were verified but he did not have enough of several to refill his pillbox. He stated he would be able to pick them up today and agreed to have me back tomorrow to finish filling his pillbox.   Steven Chase, EMT 10/19/19  ACTION: Home visit completed Next visit planned for tomorrow

## 2019-10-20 ENCOUNTER — Other Ambulatory Visit (HOSPITAL_COMMUNITY): Payer: Self-pay

## 2019-10-20 LAB — RPR: RPR Ser Ql: NONREACTIVE

## 2019-10-20 NOTE — Progress Notes (Signed)
Mr Gearheart was seen at home today and reported feeling well. The purpose of this visit was to deliver medications from the pharmacy and finish filling his pillbox. Nothing further was necessary and we agreed to meet next week before the holiday. I will follow up then.   Jacquiline Doe, EMT 10/20/19

## 2019-10-23 ENCOUNTER — Telehealth (HOSPITAL_COMMUNITY): Payer: Self-pay | Admitting: Emergency Medicine

## 2019-10-23 LAB — CYTOLOGY, (ORAL, ANAL, URETHRAL) ANCILLARY ONLY
Chlamydia: NEGATIVE
Neisseria Gonorrhea: POSITIVE — AB
Trichomonas: NEGATIVE

## 2019-10-23 NOTE — Telephone Encounter (Signed)
Test for gonorrhea was positive. This was treated at the urgent care visit with IM rocephin 250mg and po zithromax 1g. Pt needs education to refrain from sexual intercourse for 7 days after treatment to give the medicine time to work. Sexual partners need to be notified and tested/treated. Condoms may reduce risk of reinfection. Recheck or followup with PCP for further evaluation if symptoms are not improving. [facility] notified.   Attempted to reach patient. No answer at this time. Voicemail left.     

## 2019-10-24 ENCOUNTER — Telehealth: Payer: Self-pay | Admitting: Emergency Medicine

## 2019-10-24 NOTE — Telephone Encounter (Signed)
Patient contacted by phone and made aware of  cytolgoy  results. Pt verbalized understanding and had all questions answered.

## 2019-10-25 ENCOUNTER — Other Ambulatory Visit (HOSPITAL_COMMUNITY): Payer: Self-pay

## 2019-10-25 NOTE — Progress Notes (Signed)
Paramedicine Encounter    Patient ID: Steven Chase, male    DOB: Jan 25, 1986, 33 y.o.   MRN: [id number]   Patient Care Team: Corwin Levins, MD as PCP - General (Internal Medicine) Nahser, Deloris Ping, MD as PCP - Cardiology (Cardiology) Burna Sis, LCSW as Social Worker (Licensed Clinical Social Worker)  Patient Active Problem List   Diagnosis Date Noted  . Acute exacerbation of CHF (congestive heart failure) (HCC) 07/15/2019  . Lipodystrophy 10/21/2018  . Obesity (BMI 30-39.9) 10/06/2018  . Chronic systolic heart failure (HCC) 02/17/2018  . Fatigue 02/17/2018  . Cardiomyopathy (HCC) 02/11/2018  . Hypertensive crisis 02/04/2018  . Chest pain 02/04/2018  . CKD (chronic kidney disease), stage II 02/04/2018  . Allergic rhinitis 01/21/2018  . Hyperglycemia 12/02/2017  . Hypokalemia 12/02/2017  . Cough 12/02/2017  . Patellar tendonitis of left knee 12/02/2017  . Preventative health care 12/10/2016  . Obesity 05/25/2016  . Lower leg edema 12/12/2014  . Essential hypertension 11/05/2014  . OSA (obstructive sleep apnea) 11/05/2014    Current Outpatient Medications:  .  carvedilol (COREG) 25 MG tablet, Take 1 tablet (25 mg total) by mouth 2 (two) times daily with a meal., Disp: 60 tablet, Rfl: 3 .  digoxin (LANOXIN) 0.125 MG tablet, Take 1 tablet (125 mcg total) by mouth daily., Disp: 30 tablet, Rfl: 3 .  hydrALAZINE (APRESOLINE) 100 MG tablet, Take 1 tablet (100 mg total) by mouth every 8 (eight) hours., Disp: 90 tablet, Rfl: 3 .  ibuprofen (ADVIL) 600 MG tablet, Take 1 tablet (600 mg total) by mouth every 6 (six) hours as needed. (Patient not taking: Reported on 10/13/2019), Disp: 30 tablet, Rfl: 0 .  isosorbide mononitrate (IMDUR) 60 MG 24 hr tablet, Take 1 tablet (60 mg total) by mouth daily., Disp: 30 tablet, Rfl: 3 .  potassium chloride SA (KLOR-CON) 20 MEQ tablet, Take 3 tablets (60 mEq total) by mouth daily., Disp: 90 tablet, Rfl: 3 .  sacubitril-valsartan (ENTRESTO) 49-51  MG, Take 1 tablet by mouth 2 (two) times daily., Disp: 60 tablet, Rfl: 11 .  spironolactone (ALDACTONE) 50 MG tablet, Take 1 tablet (50 mg total) by mouth daily., Disp: 30 tablet, Rfl: 3 .  torsemide (DEMADEX) 20 MG tablet, Take 2 tablets (40 mg total) by mouth daily., Disp: 60 tablet, Rfl: 3 No Known Allergies    Social History   Socioeconomic History  . Marital status: Single    Spouse name: Not on file  . Number of children: 1  . Years of education: 83  . Highest education level: Not on file  Occupational History  . 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 Smoking status: Never Smoker  . Smokeless tobacco: Never Used  Substance and Sexual Activity  . Alcohol use: No  . Drug use: No  . Sexual activity: Not on file  Other Topics Concern  . Not on file  Social History Narrative   Born and raised in [place], [place]. Currently live in a private residence with his mother. Fun: Play with his daughter.   Denies religious beliefs that would effect health care.    Social Determinants of Health   Financial Resource Strain: Low Risk   . Difficulty of Paying Living Expenses: Not very hard  Food Insecurity: Food Insecurity Present  . Worried About Programme researcher, broadcasting/film/video in the Last Year: Sometimes true  . Ran Out of Food in the Last Year: Sometimes true  Transportation Needs: No  Transportation Needs  . Lack of Transportation (Medical): No  . Lack of Transportation (Non-Medical): No  Physical Activity:   . Days of Exercise per Week: Not on file  . Minutes of Exercise per Session: Not on file  Stress:   . Feeling of Stress : Not on file  Social Connections:   . Frequency of Communication with Friends and Family: Not on file  . Frequency of Social Gatherings with Friends and Family: Not on file  . Attends Religious Services: Not on file  . Active Member of Clubs or Organizations: Not on file  . Attends Archivist Meetings: Not on file  . Marital  Status: Not on file  Intimate Partner Violence:   . Fear of Current or Ex-Partner: Not on file  . Emotionally Abused: Not on file  . Physically Abused: Not on file  . Sexually Abused: Not on file    Physical Exam Cardiovascular:     Rate and Rhythm: Normal rate and regular rhythm.     Pulses: Normal pulses.  Pulmonary:     Effort: Pulmonary effort is normal.     Breath sounds: Normal breath sounds.  Musculoskeletal:        General: Normal range of motion.     Right lower leg: No edema.     Left lower leg: No edema.  Skin:    General: Skin is warm and dry.     Capillary Refill: Capillary refill takes less than 2 seconds.  Neurological:     Mental Status: He is alert and oriented to person, place, and time.  Psychiatric:        Mood and Affect: Mood normal.         Future Appointments  Date Time Provider [place]  11/10/2019  9:00 AM [facility] 1 [facility]  11/10/2019 10:00 AM Bensimhon, Shaune Pascal, MD [facility] None  04/10/2020  3:00 PM Biagio Borg, MD [facility] None    BP 128/84 (BP Location: Left Arm, Patient Position: Sitting, Cuff Size: Normal)   Pulse 86   Resp 16   Wt 238 lb 9.6 oz (108.2 kg)   SpO2 97%   BMI 38.51 kg/m    Weight yesterday- 236 lb Last visit weight- 236 lb  Steven Chase was seen at home today and reported feeling well. He denied chest pain, SOB, headache, dizziness, orthopnea, fever or cough. He stated he has been compliant with his medications over the past week and his weight has been generally stable. His medications were verified and his pillbox was refilled. I will follow up next week.   Jacquiline Doe, EMT 10/25/19  ACTION: Home visit completed Next visit planned for 1 week

## 2019-10-26 MED FILL — TORSEMIDE 20 MG TABLET: 20 | 30 days supply | Qty: 60 | Fill #2

## 2019-11-02 ENCOUNTER — Other Ambulatory Visit (HOSPITAL_COMMUNITY): Payer: Self-pay

## 2019-11-02 ENCOUNTER — Other Ambulatory Visit (HOSPITAL_COMMUNITY): Payer: Self-pay | Admitting: Cardiology

## 2019-11-02 MED ORDER — SPIRONOLACTONE 50 MG PO TABS: 50.0000 mg | ORAL_TABLET | Freq: Every day | ORAL | 3 refills | Status: DC

## 2019-11-02 MED FILL — SPIRONOLACTONE 50 MG TABLET: 50 | 30 days supply | Qty: 30 | Fill #0

## 2019-11-02 NOTE — Progress Notes (Signed)
Paramedicine Encounter    Patient ID: Steven Chase, male    DOB: 05/02/1986, 33 y.o.   MRN: [id number]   Patient Care Team: Steven Levins, MD as PCP - General (Internal Medicine) Nahser, Deloris Ping, MD as PCP - Cardiology (Cardiology) Steven Sis, LCSW as Social Worker (Licensed Clinical Social Worker)  Patient Active Problem List   Diagnosis Date Noted  . Acute exacerbation of CHF (congestive heart failure) (HCC) 07/15/2019  . Lipodystrophy 10/21/2018  . Obesity (BMI 30-39.9) 10/06/2018  . Chronic systolic heart failure (HCC) 02/17/2018  . Fatigue 02/17/2018  . Cardiomyopathy (HCC) 02/11/2018  . Hypertensive crisis 02/04/2018  . Chest pain 02/04/2018  . CKD (chronic kidney disease), stage II 02/04/2018  . Allergic rhinitis 01/21/2018  . Hyperglycemia 12/02/2017  . Hypokalemia 12/02/2017  . Cough 12/02/2017  . Patellar tendonitis of left knee 12/02/2017  . Preventative health care 12/10/2016  . Obesity 05/25/2016  . Lower leg edema 12/12/2014  . Essential hypertension 11/05/2014  . OSA (obstructive sleep apnea) 11/05/2014    Current Outpatient Medications:  .  carvedilol (COREG) 25 MG tablet, Take 1 tablet (25 mg total) by mouth 2 (two) times daily with a meal., Disp: 60 tablet, Rfl: 3 .  digoxin (LANOXIN) 0.125 MG tablet, Take 1 tablet (125 mcg total) by mouth daily., Disp: 30 tablet, Rfl: 3 .  hydrALAZINE (APRESOLINE) 100 MG tablet, Take 1 tablet (100 mg total) by mouth every 8 (eight) hours., Disp: 90 tablet, Rfl: 3 .  ibuprofen (ADVIL) 600 MG tablet, Take 1 tablet (600 mg total) by mouth every 6 (six) hours as needed. (Patient not taking: Reported on 10/13/2019), Disp: 30 tablet, Rfl: 0 .  isosorbide mononitrate (IMDUR) 60 MG 24 hr tablet, Take 1 tablet (60 mg total) by mouth daily., Disp: 30 tablet, Rfl: 3 .  potassium chloride SA (KLOR-CON) 20 MEQ tablet, Take 3 tablets (60 mEq total) by mouth daily., Disp: 90 tablet, Rfl: 3 .  sacubitril-valsartan (ENTRESTO) 49-51  MG, Take 1 tablet by mouth 2 (two) times daily., Disp: 60 tablet, Rfl: 11 .  spironolactone (ALDACTONE) 50 MG tablet, Take 1 tablet (50 mg total) by mouth daily., Disp: 30 tablet, Rfl: 3 .  torsemide (DEMADEX) 20 MG tablet, Take 2 tablets (40 mg total) by mouth daily., Disp: 60 tablet, Rfl: 3 No Known Allergies    Social History   Socioeconomic History  . Marital status: Single    Spouse name: Not on file  . Number of children: 1  . Years of education: 70  . Highest education level: Not on file  Occupational History  . Occupation: Therapist, music: [employer]  Tobacco Use  . Smoking status: Never Smoker  . Smokeless tobacco: Never Used  Substance and Sexual Activity  . Alcohol use: No  . Drug use: No  . Sexual activity: Not on file  Other Topics Concern  . Not on file  Social History Narrative   Born and raised in [place], [place]. Currently live in a private residence with his mother. Fun: Play with his daughter.   Denies religious beliefs that would effect health care.    Social Determinants of Health   Financial Resource Strain: Low Risk   . Difficulty of Paying Living Expenses: Not very hard  Food Insecurity: Food Insecurity Present  . Worried About Programme researcher, broadcasting/film/video in the Last Year: Sometimes true  . Ran Out of Food in the Last Year: Sometimes true  Transportation Needs: No  Transportation Needs  . Lack of Transportation (Medical): No  . Lack of Transportation (Non-Medical): No  Physical Activity:   . Days of Exercise per Week: Not on file  . Minutes of Exercise per Session: Not on file  Stress:   . Feeling of Stress : Not on file  Social Connections:   . Frequency of Communication with Friends and Family: Not on file  . Frequency of Social Gatherings with Friends and Family: Not on file  . Attends Religious Services: Not on file  . Active Member of Clubs or Organizations: Not on file  . Attends Archivist Meetings: Not on file  . Marital  Status: Not on file  Intimate Partner Violence:   . Fear of Current or Ex-Partner: Not on file  . Emotionally Abused: Not on file  . Physically Abused: Not on file  . Sexually Abused: Not on file    Physical Exam Cardiovascular:     Rate and Rhythm: Normal rate and regular rhythm.     Pulses: Normal pulses.  Pulmonary:     Effort: Pulmonary effort is normal.     Breath sounds: Normal breath sounds.  Musculoskeletal:        General: Normal range of motion.     Right lower leg: No edema.     Left lower leg: No edema.  Skin:    General: Skin is warm and dry.     Capillary Refill: Capillary refill takes less than 2 seconds.  Neurological:     Mental Status: He is alert and oriented to person, place, and time.  Psychiatric:        Mood and Affect: Mood normal.         Future Appointments  Date Time Provider Steven Chase  11/10/2019  9:00 AM [facility] Steven Chase Steven Chase  11/10/2019 10:00 AM Steven Chase, Steven Pascal, MD Steven Chase None  04/10/2020  3:00 PM Steven Borg, MD Steven Chase None    BP (!) 100/51 (BP Location: Left Arm, Patient Position: Sitting, Cuff Size: Normal)   Pulse 86   Resp 16   Wt 238 lb 11.2 oz (108.3 kg)   SpO2 97%   BMI 38.53 kg/m   Weight yesterday- 235 lb Last visit weight- 238 lb  Steven Chase was seen at home today and reported feeling well. He denied chest pain, SOB, headache, dizziness, orthopnea, fever or cough since our last visit. He stated he has been compliant with his medications over the past week and his weight has been stable. His medications were verified and his pillbox was refilled. I will follow up next week.   Steven Chase, EMT 11/02/19  ACTION: Home visit completed Next visit planned for 2 weeks

## 2019-11-06 MED FILL — DIGOXIN 0.125 MG TABLET: 125 | 30 days supply | Qty: 30 | Fill #3

## 2019-11-08 ENCOUNTER — Other Ambulatory Visit (HOSPITAL_COMMUNITY): Payer: Self-pay

## 2019-11-08 ENCOUNTER — Encounter (HOSPITAL_COMMUNITY): Payer: Self-pay

## 2019-11-08 ENCOUNTER — Ambulatory Visit (HOSPITAL_COMMUNITY)
Admission: EM | Admit: 2019-11-08 | Discharge: 2019-11-08 | Disposition: A | Discharge status: Home / Self Care | Payer: Self-pay | Attending: Family Medicine | Admitting: Family Medicine

## 2019-11-08 ENCOUNTER — Other Ambulatory Visit: Payer: Self-pay

## 2019-11-08 ENCOUNTER — Ambulatory Visit (INDEPENDENT_AMBULATORY_CARE_PROVIDER_SITE_OTHER): Payer: Self-pay

## 2019-11-08 DIAGNOSIS — X501XXA Overexertion from prolonged static or awkward postures, initial encounter: Secondary | ICD-10-CM

## 2019-11-08 DIAGNOSIS — S8992XA Unspecified injury of left lower leg, initial encounter: Secondary | ICD-10-CM

## 2019-11-08 NOTE — Progress Notes (Signed)
Paramedicine Encounter    Patient ID: Steven Chase, male    DOB: 11-01-1986, 34 y.o.   MRN: [id number]   Patient Care Team: Corwin Levins, MD as PCP - General (Internal Medicine) Nahser, Deloris Ping, MD as PCP - Cardiology (Cardiology) Burna Sis, LCSW as Social Worker (Licensed Clinical Social Worker)  Patient Active Problem List   Diagnosis Date Noted  . Acute exacerbation of CHF (congestive heart failure) (HCC) 07/15/2019  . Lipodystrophy 10/21/2018  . Obesity (BMI 30-39.9) 10/06/2018  . Chronic systolic heart failure (HCC) 02/17/2018  . Fatigue 02/17/2018  . Cardiomyopathy (HCC) 02/11/2018  . Hypertensive crisis 02/04/2018  . Chest pain 02/04/2018  . CKD (chronic kidney disease), stage II 02/04/2018  . Allergic rhinitis 01/21/2018  . Hyperglycemia 12/02/2017  . Hypokalemia 12/02/2017  . Cough 12/02/2017  . Patellar tendonitis of left knee 12/02/2017  . Preventative health care 12/10/2016  . Obesity 05/25/2016  . Lower leg edema 12/12/2014  . Essential hypertension 11/05/2014  . OSA (obstructive sleep apnea) 11/05/2014    Current Outpatient Medications:  .  carvedilol (COREG) 25 MG tablet, Take 1 tablet (25 mg total) by mouth 2 (two) times daily with a meal., Disp: 60 tablet, Rfl: 3 .  digoxin (LANOXIN) 0.125 MG tablet, Take 1 tablet (125 mcg total) by mouth daily., Disp: 30 tablet, Rfl: 3 .  hydrALAZINE (APRESOLINE) 100 MG tablet, Take 1 tablet (100 mg total) by mouth every 8 (eight) hours., Disp: 90 tablet, Rfl: 3 .  isosorbide mononitrate (IMDUR) 60 MG 24 hr tablet, Take 1 tablet (60 mg total) by mouth daily., Disp: 30 tablet, Rfl: 3 .  potassium chloride SA (KLOR-CON) 20 MEQ tablet, Take 3 tablets (60 mEq total) by mouth daily., Disp: 90 tablet, Rfl: 3 .  sacubitril-valsartan (ENTRESTO) 49-51 MG, Take 1 tablet by mouth 2 (two) times daily., Disp: 60 tablet, Rfl: 11 .  spironolactone (ALDACTONE) 50 MG tablet, Take 1 tablet (50 mg total) by mouth daily., Disp: 30  tablet, Rfl: 3 .  torsemide (DEMADEX) 20 MG tablet, Take 2 tablets (40 mg total) by mouth daily., Disp: 60 tablet, Rfl: 3 .  ibuprofen (ADVIL) 600 MG tablet, Take 1 tablet (600 mg total) by mouth every 6 (six) hours as needed. (Patient not taking: Reported on 10/13/2019), Disp: 30 tablet, Rfl: 0 No Known Allergies    Social History   Socioeconomic History  . Marital status: Single    Spouse name: Not on file  . Number of children: 1  . Years of education: 7  . Highest education level: Not on file  Occupational History  . 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 Smoking status: Never Smoker  . Smokeless tobacco: Never Used  Substance and Sexual Activity  . Alcohol use: No  . Drug use: No  . Sexual activity: Not on file  Other Topics Concern  . Not on file  Social History Narrative   Born and raised in [place], [place]. Currently live in a private residence with his mother. Fun: Play with his daughter.   Denies religious beliefs that would effect health care.    Social Determinants of Health   Financial Resource Strain: Low Risk   . Difficulty of Paying Living Expenses: Not very hard  Food Insecurity: Food Insecurity Present  . Worried About Programme researcher, broadcasting/film/video in the Last Year: Sometimes true  . Ran Out of Food in the Last Year: Sometimes true  Transportation Needs: No  Transportation Needs  . Lack of Transportation (Medical): No  . Lack of Transportation (Non-Medical): No  Physical Activity:   . Days of Exercise per Week: Not on file  . Minutes of Exercise per Session: Not on file  Stress:   . Feeling of Stress : Not on file  Social Connections:   . Frequency of Communication with Friends and Family: Not on file  . Frequency of Social Gatherings with Friends and Family: Not on file  . Attends Religious Services: Not on file  . Active Member of Clubs or Organizations: Not on file  . Attends Archivist Meetings: Not on file  .  Marital Status: Not on file  Intimate Partner Violence:   . Fear of Current or Ex-Partner: Not on file  . Emotionally Abused: Not on file  . Physically Abused: Not on file  . Sexually Abused: Not on file    Physical Exam Cardiovascular:     Rate and Rhythm: Normal rate and regular rhythm.     Pulses: Normal pulses.  Pulmonary:     Effort: Pulmonary effort is normal.     Breath sounds: Normal breath sounds.  Abdominal:     General: Abdomen is flat. There is no distension.  Musculoskeletal:        General: Normal range of motion.     Right lower leg: No edema.     Left lower leg: No edema.  Skin:    General: Skin is warm and dry.     Capillary Refill: Capillary refill takes less than 2 seconds.  Neurological:     Mental Status: He is alert and oriented to person, place, and time.  Psychiatric:        Mood and Affect: Mood normal.         Future Appointments  Date Time Provider [place]  11/10/2019  9:00 AM [facility] 1 [facility]  11/10/2019 10:00 AM Bensimhon, Shaune Pascal, MD [facility] None  04/10/2020  3:00 PM Biagio Borg, MD [facility] None    BP 127/76 (BP Location: Left Arm, Patient Position: Sitting, Cuff Size: Normal)   Pulse 83   Resp 16   Wt 238 lb 9.6 oz (108.2 kg)   SpO2 97%   BMI 38.51 kg/m   Weight yesterday- 236 lb Last visit weight- 238 lb  Steven Chase was seen at home today and reported feeling well. He denied chest pain, SOB, headache, dizziness, orthopnea, fever or cough since our last visit. He stated he has been compliant with his medications and his weight has been stable. His medications were verified and his pillbox was refilled. I will follow up in two weeks.   Jacquiline Doe, EMT 11/08/19  ACTION: Home visit completed Next visit planned for 2 weeks

## 2019-11-08 NOTE — ED Triage Notes (Signed)
Pt states he has left knee pain. This started this morning. Pt states he was getting out of bed and he misstepped. Pt states he twisted his knee.

## 2019-11-08 NOTE — Discharge Instructions (Addendum)
Your x ray showed a small effusion most likely from the injury. No broken bones.  We will apply ace wrap and have you rest, ice and elevate the knee.  Ibuprofen 600 mg every 8 hours.  Follow up as needed for continued or worsening symptoms'

## 2019-11-09 NOTE — ED Provider Notes (Addendum)
[facility]    CSN: [id number] Arrival date & time: 11/08/19  1350      History   Chief Complaint Chief Complaint  Patient presents with  . Knee Pain    HPI Steven Chase is a 34 y.o. male.   Patient is a 34 year old male with past medical history of hypertension, obesity, OSA, cardiomyopathy, CKD, patellar tendinitis.  He presents today with left knee pain.  This started this morning after he was getting out of bed and missed a step twisting the left knee.  Since he has had mild generalized knee pain with swelling.  Able to ambulate on the knee.  Did not take any medication for symptoms.  Denies any radiation of pain, numbness or tingling.  ROS per HPI      Past Medical History:  Diagnosis Date  . Hypertension   . Obesity   . OSA (obstructive sleep apnea) 11/05/2014   02/2015 -severe -AHI 106/h     Patient Active Problem List   Diagnosis Date Noted  . Acute exacerbation of CHF (congestive heart failure) (HCC) 07/15/2019  . Lipodystrophy 10/21/2018  . Obesity (BMI 30-39.9) 10/06/2018  . Chronic systolic heart failure (HCC) 02/17/2018  . Fatigue 02/17/2018  . Cardiomyopathy (HCC) 02/11/2018  . Hypertensive crisis 02/04/2018  . Chest pain 02/04/2018  . CKD (chronic kidney disease), stage II 02/04/2018  . Allergic rhinitis 01/21/2018  . Hyperglycemia 12/02/2017  . Hypokalemia 12/02/2017  . Cough 12/02/2017  . Patellar tendonitis of left knee 12/02/2017  . Preventative health care 12/10/2016  . Obesity 05/25/2016  . Lower leg edema 12/12/2014  . Essential hypertension 11/05/2014  . OSA (obstructive sleep apnea) 11/05/2014    Past Surgical History:  Procedure Laterality Date  . HERNIA REPAIR    . RIGHT/LEFT HEART CATH AND CORONARY ANGIOGRAPHY N/A 02/07/2018   Procedure: RIGHT/LEFT HEART CATH AND CORONARY ANGIOGRAPHY;  Surgeon: Marykay Lex, MD;  Location: [facility] INVASIVE CV LAB;  Service: Cardiovascular;  Laterality: N/A;       Home Medications      Prior to Admission medications   Medication Sig Start Date End Date Taking? Authorizing Provider  carvedilol (COREG) 25 MG tablet Take 1 tablet (25 mg total) by mouth 2 (two) times daily with a meal. 08/18/19   Bensimhon, Bevelyn Buckles, MD  digoxin (LANOXIN) 0.125 MG tablet Take 1 tablet (125 mcg total) by mouth daily. 08/18/19   Bensimhon, Bevelyn Buckles, MD  hydrALAZINE (APRESOLINE) 100 MG tablet Take 1 tablet (100 mg total) by mouth every 8 (eight) hours. 10/13/19   Bensimhon, Bevelyn Buckles, MD  ibuprofen (ADVIL) 600 MG tablet Take 1 tablet (600 mg total) by mouth every 6 (six) hours as needed. Patient not taking: Reported on 10/13/2019 08/31/19   Mickie Bail, NP  isosorbide mononitrate (IMDUR) 60 MG 24 hr tablet Take 1 tablet (60 mg total) by mouth daily. 08/18/19   Bensimhon, Bevelyn Buckles, MD  potassium chloride SA (KLOR-CON) 20 MEQ tablet Take 3 tablets (60 mEq total) by mouth daily. 08/18/19   Bensimhon, Bevelyn Buckles, MD  sacubitril-valsartan (ENTRESTO) 49-51 MG Take 1 tablet by mouth 2 (two) times daily. 08/25/19   Clegg, Amy D, NP  spironolactone (ALDACTONE) 50 MG tablet Take 1 tablet (50 mg total) by mouth daily. 11/02/19   Bensimhon, Bevelyn Buckles, MD  torsemide (DEMADEX) 20 MG tablet Take 2 tablets (40 mg total) by mouth daily. 08/18/19   Bensimhon, Bevelyn Buckles, MD    Family History Family History  Problem  Relation Age of Onset  . Hypertension Mother   . Heart disease Maternal Grandfather   . Hypertension Maternal Grandfather   . Hypertension Sister     Social History Social History   Tobacco Use  . Smoking status: Never Smoker  . Smokeless tobacco: Never Used  Substance Use Topics  . Alcohol use: No  . Drug use: No     Allergies   Patient has no known allergies.   Review of Systems Review of Systems   Physical Exam Triage Vital Signs ED Triage Vitals  Enc Vitals Group     BP 11/08/19 1445 97/61     Pulse Rate 11/08/19 1445 88     Resp 11/08/19 1445 16     Temp 11/08/19 1445 (!)  97.5 F (36.4 C)     Temp Source 11/08/19 1445 Oral     SpO2 11/08/19 1445 95 %     Weight 11/08/19 1504 238 lb (108 kg)     Height --      Head Circumference --      Peak Flow --      Pain Score 11/08/19 1503 8     Pain Loc --      Pain Edu? --      Excl. in GC? --    No data found.  Updated Vital Signs BP 97/61 (BP Location: Right Arm)   Pulse 88   Temp (!) 97.5 F (36.4 C) (Oral)   Resp 16   Wt 238 lb (108 kg)   SpO2 95%   BMI 38.41 kg/m   Visual Acuity Right Eye Distance:   Left Eye Distance:   Bilateral Distance:    Right Eye Near:   Left Eye Near:    Bilateral Near:     Physical Exam Vitals and nursing note reviewed.  Constitutional:      General: He is not in acute distress.    Appearance: Normal appearance. He is not ill-appearing, toxic-appearing or diaphoretic.  HENT:     Head: Normocephalic and atraumatic.     Nose: Nose normal.  Eyes:     Conjunctiva/sclera: Conjunctivae normal.  Pulmonary:     Effort: Pulmonary effort is normal.  Musculoskeletal:        General: Swelling and tenderness present. No deformity or signs of injury.     Cervical back: Normal range of motion.     Left knee: Swelling, effusion and bony tenderness present. No erythema, ecchymosis or lacerations. Decreased range of motion. Tenderness present over the patellar tendon. No LCL laxity, MCL laxity, ACL laxity or PCL laxity.    Right lower leg: No edema.     Left lower leg: No edema.       Legs:  Skin:    General: Skin is warm and dry.  Neurological:     Mental Status: He is alert.  Psychiatric:        Mood and Affect: Mood normal.      UC Treatments / Results  Labs (all labs ordered are listed, but only abnormal results are displayed) Labs Reviewed - No data to display  EKG   Radiology DG Knee Complete 4 Views Left  Result Date: 11/08/2019 CLINICAL DATA:  Pain EXAM: LEFT KNEE - COMPLETE 4+ VIEW COMPARISON:  None. FINDINGS: No evidence of fracture, dislocation,  or joint effusion. No evidence of arthropathy or other focal bone abnormality. Soft tissues are unremarkable. There is a small suprapatellar joint effusion. IMPRESSION: Small suprapatellar joint effusion. No acute bony  abnormality. Electronically Signed   By: Constance Holster M.D.   On: 11/08/2019 15:45    Procedures Procedures (including critical care time)  Medications Ordered in UC Medications - No data to display  Initial Impression / Assessment and Plan / UC Course  I have reviewed the triage vital signs and the nursing notes.  Pertinent labs & imaging results that were available during my care of the patient were reviewed by me and considered in my medical decision making (see chart for details).     Injury left knee-x-ray reveals small suprapatellar joint effusion. No acute fracture or dislocation No concern for ligamentous injury at this time Rest, ice, elevate and ibuprofen for pain and inflammation Ace wrap applied here in clinic Follow up as needed for continued or worsening symptoms  Final Clinical Impressions(s) / UC Diagnoses   Final diagnoses:  Injury of left knee, initial encounter     Discharge Instructions     Your x ray showed a small effusion most likely from the injury. No broken bones.  We will apply ace wrap and have you rest, ice and elevate the knee.  Ibuprofen 600 mg every 8 hours.  Follow up as needed for continued or worsening symptoms'     ED Prescriptions    None     PDMP not reviewed this encounter.   Orvan July, NP 11/09/19 1036    Loura Halt A, NP 11/09/19 1038

## 2019-11-10 ENCOUNTER — Ambulatory Visit (HOSPITAL_BASED_OUTPATIENT_CLINIC_OR_DEPARTMENT_OTHER)
Admission: RE | Admit: 2019-11-10 | Discharge: 2019-11-10 | Disposition: A | Discharge status: Home / Self Care | Payer: Self-pay | Source: Ambulatory Visit | Attending: Adult Health | Admitting: Adult Health

## 2019-11-10 ENCOUNTER — Ambulatory Visit (HOSPITAL_COMMUNITY)
Admission: RE | Admit: 2019-11-10 | Discharge: 2019-11-10 | Disposition: A | Discharge status: Home / Self Care | Payer: Self-pay | Source: Ambulatory Visit | Attending: Internal Medicine | Admitting: Internal Medicine

## 2019-11-10 ENCOUNTER — Other Ambulatory Visit: Payer: Self-pay

## 2019-11-10 ENCOUNTER — Telehealth (HOSPITAL_COMMUNITY): Payer: Self-pay

## 2019-11-10 ENCOUNTER — Encounter (HOSPITAL_COMMUNITY): Payer: Self-pay | Admitting: *Deleted

## 2019-11-10 ENCOUNTER — Ambulatory Visit (HOSPITAL_COMMUNITY)
Admission: RE | Admit: 2019-11-10 | Discharge: 2019-11-10 | Disposition: A | Discharge status: Home / Self Care | Payer: Self-pay | Source: Ambulatory Visit | Attending: Cardiology | Admitting: Cardiology

## 2019-11-10 ENCOUNTER — Encounter (HOSPITAL_COMMUNITY): Payer: Self-pay | Admitting: Internal Medicine

## 2019-11-10 ENCOUNTER — Inpatient Hospital Stay (HOSPITAL_COMMUNITY)
Admission: EM | Admit: 2019-11-10 | Discharge: 2019-11-13 | DRG: 683 | Disposition: A | Discharge status: Home / Self Care | Payer: 59 | Attending: Internal Medicine | Admitting: Internal Medicine

## 2019-11-10 VITALS — BP 98/63 | HR 88 | Wt 239.4 lb

## 2019-11-10 DIAGNOSIS — I130 Hypertensive heart and chronic kidney disease with heart failure and stage 1 through stage 4 chronic kidney disease, or unspecified chronic kidney disease: Secondary | ICD-10-CM | POA: Insufficient documentation

## 2019-11-10 DIAGNOSIS — I10 Essential (primary) hypertension: Secondary | ICD-10-CM

## 2019-11-10 DIAGNOSIS — I5043 Acute on chronic combined systolic (congestive) and diastolic (congestive) heart failure: Secondary | ICD-10-CM

## 2019-11-10 DIAGNOSIS — E860 Dehydration: Secondary | ICD-10-CM | POA: Diagnosis present

## 2019-11-10 DIAGNOSIS — I428 Other cardiomyopathies: Secondary | ICD-10-CM | POA: Insufficient documentation

## 2019-11-10 DIAGNOSIS — Y92009 Unspecified place in unspecified non-institutional (private) residence as the place of occurrence of the external cause: Secondary | ICD-10-CM | POA: Diagnosis not present

## 2019-11-10 DIAGNOSIS — M25462 Effusion, left knee: Secondary | ICD-10-CM | POA: Diagnosis present

## 2019-11-10 DIAGNOSIS — I5022 Chronic systolic (congestive) heart failure: Secondary | ICD-10-CM | POA: Diagnosis present

## 2019-11-10 DIAGNOSIS — M109 Gout, unspecified: Secondary | ICD-10-CM | POA: Diagnosis present

## 2019-11-10 DIAGNOSIS — T39395A Adverse effect of other nonsteroidal anti-inflammatory drugs [NSAID], initial encounter: Secondary | ICD-10-CM | POA: Diagnosis present

## 2019-11-10 DIAGNOSIS — G4733 Obstructive sleep apnea (adult) (pediatric): Secondary | ICD-10-CM | POA: Insufficient documentation

## 2019-11-10 DIAGNOSIS — Z6838 Body mass index (BMI) 38.0-38.9, adult: Secondary | ICD-10-CM

## 2019-11-10 DIAGNOSIS — Z8249 Family history of ischemic heart disease and other diseases of the circulatory system: Secondary | ICD-10-CM | POA: Diagnosis not present

## 2019-11-10 DIAGNOSIS — N182 Chronic kidney disease, stage 2 (mild): Secondary | ICD-10-CM

## 2019-11-10 DIAGNOSIS — Z791 Long term (current) use of non-steroidal anti-inflammatories (NSAID): Secondary | ICD-10-CM | POA: Diagnosis not present

## 2019-11-10 DIAGNOSIS — Z79899 Other long term (current) drug therapy: Secondary | ICD-10-CM | POA: Diagnosis not present

## 2019-11-10 DIAGNOSIS — Z5320 Procedure and treatment not carried out because of patient's decision for unspecified reasons: Secondary | ICD-10-CM | POA: Diagnosis present

## 2019-11-10 DIAGNOSIS — E875 Hyperkalemia: Secondary | ICD-10-CM | POA: Diagnosis present

## 2019-11-10 DIAGNOSIS — Z6839 Body mass index (BMI) 39.0-39.9, adult: Secondary | ICD-10-CM

## 2019-11-10 DIAGNOSIS — E669 Obesity, unspecified: Secondary | ICD-10-CM | POA: Diagnosis present

## 2019-11-10 DIAGNOSIS — I959 Hypotension, unspecified: Secondary | ICD-10-CM | POA: Diagnosis present

## 2019-11-10 DIAGNOSIS — Z20822 Contact with and (suspected) exposure to covid-19: Secondary | ICD-10-CM | POA: Diagnosis present

## 2019-11-10 DIAGNOSIS — D638 Anemia in other chronic diseases classified elsewhere: Secondary | ICD-10-CM | POA: Diagnosis present

## 2019-11-10 DIAGNOSIS — Z9114 Patient's other noncompliance with medication regimen: Secondary | ICD-10-CM | POA: Insufficient documentation

## 2019-11-10 DIAGNOSIS — N1831 Chronic kidney disease, stage 3a: Secondary | ICD-10-CM | POA: Diagnosis present

## 2019-11-10 DIAGNOSIS — E781 Pure hyperglyceridemia: Secondary | ICD-10-CM | POA: Diagnosis present

## 2019-11-10 DIAGNOSIS — E785 Hyperlipidemia, unspecified: Secondary | ICD-10-CM | POA: Diagnosis present

## 2019-11-10 DIAGNOSIS — D509 Iron deficiency anemia, unspecified: Secondary | ICD-10-CM | POA: Diagnosis present

## 2019-11-10 DIAGNOSIS — N183 Chronic kidney disease, stage 3 unspecified: Secondary | ICD-10-CM | POA: Insufficient documentation

## 2019-11-10 DIAGNOSIS — N179 Acute kidney failure, unspecified: Secondary | ICD-10-CM | POA: Diagnosis present

## 2019-11-10 HISTORY — DX: Heart failure, unspecified: I50.9

## 2019-11-10 LAB — CBC
HCT: 33.7 % — ABNORMAL LOW (ref 39.0–52.0)
HCT: 33.6 % — ABNORMAL LOW (ref 39.0–52.0)
Hemoglobin: 10.9 g/dL — ABNORMAL LOW (ref 13.0–17.0)
Hemoglobin: 11 g/dL — ABNORMAL LOW (ref 13.0–17.0)
MCH: 26.7 pg (ref 26.0–34.0)
MCH: 26.5 pg (ref 26.0–34.0)
MCHC: 32.3 g/dL (ref 30.0–36.0)
MCHC: 32.7 g/dL (ref 30.0–36.0)
MCV: 82.4 fL (ref 80.0–100.0)
MCV: 81 fL (ref 80.0–100.0)
Platelets: 274 10*3/uL (ref 150–400)
Platelets: 292 10*3/uL (ref 150–400)
RBC: 4.09 MIL/uL — ABNORMAL LOW (ref 4.22–5.81)
RBC: 4.15 MIL/uL — ABNORMAL LOW (ref 4.22–5.81)
RDW: 20.8 % — ABNORMAL HIGH (ref 11.5–15.5)
RDW: 20.8 % — ABNORMAL HIGH (ref 11.5–15.5)
WBC: 9.5 10*3/uL (ref 4.0–10.5)
WBC: 12.8 10*3/uL — ABNORMAL HIGH (ref 4.0–10.5)
nRBC: 0.2 % (ref 0.0–0.2)
nRBC: 0 % (ref 0.0–0.2)

## 2019-11-10 LAB — BASIC METABOLIC PANEL
Anion gap: 11 (ref 5–15)
Anion gap: 9 (ref 5–15)
BUN: 90 mg/dL — ABNORMAL HIGH (ref 6–20)
BUN: 94 mg/dL — ABNORMAL HIGH (ref 6–20)
CO2: 16 mmol/L — ABNORMAL LOW (ref 22–32)
CO2: 16 mmol/L — ABNORMAL LOW (ref 22–32)
Calcium: 9.7 mg/dL (ref 8.9–10.3)
Calcium: 9.5 mg/dL (ref 8.9–10.3)
Chloride: 108 mmol/L (ref 98–111)
Chloride: 108 mmol/L (ref 98–111)
Creatinine, Ser: 5.6 mg/dL — ABNORMAL HIGH (ref 0.61–1.24)
Creatinine, Ser: 5.97 mg/dL — ABNORMAL HIGH (ref 0.61–1.24)
GFR calc Af Amer: 14 mL/min — ABNORMAL LOW (ref >60)
GFR calc Af Amer: 13 mL/min — ABNORMAL LOW (ref >60)
GFR calc non Af Amer: 12 mL/min — ABNORMAL LOW (ref >60)
GFR calc non Af Amer: 11 mL/min — ABNORMAL LOW (ref >60)
Glucose, Bld: 108 mg/dL — ABNORMAL HIGH (ref 70–99)
Glucose, Bld: 131 mg/dL — ABNORMAL HIGH (ref 70–99)
Potassium: 7.2 mmol/L (ref 3.5–5.1)
Potassium: 7.1 mmol/L (ref 3.5–5.1)
Sodium: 135 mmol/L (ref 135–145)
Sodium: 133 mmol/L — ABNORMAL LOW (ref 135–145)

## 2019-11-10 LAB — CREATININE, URINE, RANDOM: Creatinine, Urine: 76.76 mg/dL

## 2019-11-10 LAB — COMPREHENSIVE METABOLIC PANEL
ALT: 18 U/L (ref 0–44)
AST: 18 U/L (ref 15–41)
Albumin: 4.3 g/dL (ref 3.5–5.0)
Alkaline Phosphatase: 43 U/L (ref 38–126)
Anion gap: 11 (ref 5–15)
BUN: 95 mg/dL — ABNORMAL HIGH (ref 6–20)
CO2: 17 mmol/L — ABNORMAL LOW (ref 22–32)
Calcium: 9.7 mg/dL (ref 8.9–10.3)
Chloride: 107 mmol/L (ref 98–111)
Creatinine, Ser: 6.06 mg/dL — ABNORMAL HIGH (ref 0.61–1.24)
GFR calc Af Amer: 13 mL/min — ABNORMAL LOW (ref >60)
GFR calc non Af Amer: 11 mL/min — ABNORMAL LOW (ref >60)
Glucose, Bld: 102 mg/dL — ABNORMAL HIGH (ref 70–99)
Potassium: >7.5 mmol/L (ref 3.5–5.1)
Sodium: 135 mmol/L (ref 135–145)
Total Bilirubin: 0.4 mg/dL (ref 0.3–1.2)
Total Protein: 8.1 g/dL (ref 6.5–8.1)

## 2019-11-10 LAB — POTASSIUM
Potassium: 5.9 mmol/L — ABNORMAL HIGH (ref 3.5–5.1)
Potassium: 6.2 mmol/L — ABNORMAL HIGH (ref 3.5–5.1)

## 2019-11-10 LAB — BRAIN NATRIURETIC PEPTIDE
B Natriuretic Peptide: 9.2 pg/mL (ref 0.0–100.0)
B Natriuretic Peptide: 13.4 pg/mL (ref 0.0–100.0)

## 2019-11-10 LAB — LIPID PANEL
Cholesterol: 302 mg/dL — ABNORMAL HIGH (ref 0–200)
HDL: 26 mg/dL — ABNORMAL LOW (ref >40)
LDL Cholesterol: UNDETERMINED mg/dL (ref 0–99)
Total CHOL/HDL Ratio: 11.6 RATIO
Triglycerides: 1040 mg/dL — ABNORMAL HIGH (ref <150)
VLDL: UNDETERMINED mg/dL (ref 0–40)

## 2019-11-10 LAB — SODIUM, URINE, RANDOM: Sodium, Ur: 65 mmol/L

## 2019-11-10 LAB — CREATININE, SERUM
Creatinine, Ser: 5.44 mg/dL — ABNORMAL HIGH (ref 0.61–1.24)
GFR calc Af Amer: 15 mL/min — ABNORMAL LOW (ref >60)
GFR calc non Af Amer: 13 mL/min — ABNORMAL LOW (ref >60)

## 2019-11-10 LAB — SARS CORONAVIRUS 2 (TAT 6-24 HRS): SARS Coronavirus 2: NEGATIVE

## 2019-11-10 LAB — LDL CHOLESTEROL, DIRECT: Direct LDL: 92.1 mg/dL (ref 0–99)

## 2019-11-10 LAB — CBG MONITORING, ED: Glucose-Capillary: 129 mg/dL — ABNORMAL HIGH (ref 70–99)

## 2019-11-10 MED ORDER — ONDANSETRON HCL 4 MG/2ML IJ SOLN: 4.0000 mg | Freq: Four times a day (QID) | INTRAMUSCULAR | Status: DC | PRN

## 2019-11-10 MED ORDER — SODIUM CHLORIDE 0.9 % IV SOLN: 1.0000 g | Freq: Once | INTRAVENOUS | Status: DC

## 2019-11-10 MED ORDER — SODIUM POLYSTYRENE SULFONATE 15 GM/60ML PO SUSP
45.0000 g | Freq: Once | ORAL | Status: AC
  Administered 2019-11-10: 45 g via ORAL
  Filled 2019-11-10: qty 180

## 2019-11-10 MED ORDER — HEPARIN SODIUM (PORCINE) 5000 UNIT/ML IJ SOLN
5000.0000 [IU] | Freq: Three times a day (TID) | INTRAMUSCULAR | Status: DC
  Administered 2019-11-10 – 2019-11-13 (×9): 5000 [IU] via SUBCUTANEOUS
  Filled 2019-11-10 (×9): qty 1

## 2019-11-10 MED ORDER — DEXTROSE 50 % IV SOLN
1.0000 | Freq: Once | INTRAVENOUS | Status: AC
  Administered 2019-11-10: 50 mL via INTRAVENOUS
  Filled 2019-11-10: qty 50

## 2019-11-10 MED ORDER — INSULIN ASPART 100 UNIT/ML ~~LOC~~ SOLN
5.0000 [IU] | Freq: Once | SUBCUTANEOUS | Status: AC
  Administered 2019-11-10: 5 [IU] via INTRAVENOUS

## 2019-11-10 MED ORDER — SODIUM CHLORIDE 0.9 % IV BOLUS
1000.0000 mL | Freq: Once | INTRAVENOUS | Status: AC
  Administered 2019-11-10: 1000 mL via INTRAVENOUS

## 2019-11-10 MED ORDER — ONDANSETRON HCL 4 MG PO TABS: 4.0000 mg | ORAL_TABLET | Freq: Four times a day (QID) | ORAL | Status: DC | PRN

## 2019-11-10 MED ORDER — ENTRESTO 49-51 MG PO TABS: 1.0000 | ORAL_TABLET | Freq: Two times a day (BID) | ORAL | 11 refills | Status: DC

## 2019-11-10 MED ORDER — SODIUM ZIRCONIUM CYCLOSILICATE 10 G PO PACK
10.0000 g | PACK | Freq: Once | ORAL | Status: AC
  Administered 2019-11-10: 10 g via ORAL
  Filled 2019-11-10: qty 1

## 2019-11-10 MED ORDER — CALCIUM GLUCONATE-NACL 1-0.675 GM/50ML-% IV SOLN
1.0000 g | Freq: Once | INTRAVENOUS | Status: AC
  Administered 2019-11-10: 1000 mg via INTRAVENOUS
  Filled 2019-11-10: qty 50

## 2019-11-10 MED ORDER — SODIUM CHLORIDE 0.9 % IV SOLN: INTRAVENOUS | Status: DC

## 2019-11-10 MED ORDER — SODIUM CHLORIDE 0.9 % IV BOLUS
250.0000 mL | Freq: Once | INTRAVENOUS | Status: AC
  Administered 2019-11-10: 250 mL via INTRAVENOUS

## 2019-11-10 NOTE — ED Provider Notes (Signed)
[facility] EMERGENCY DEPARTMENT Provider Note   CSN: [id number] Arrival date & time: 11/10/19  1332     History Chief Complaint  Patient presents with  . Abnormal Lab    Steven Chase is a 34 y.o. male.  Patient sent in by heart failure clinic.  He was called based on blood work he had done where his potassium was 7.1.  It was repeated.  Patient has cardiomyopathy.  History of hypertension and obstructive sleep apnea.  And some obesity.  Patient not known to have any history of renal failure.    Patient is on Demadex.  Been taking that as directed.  Patient states that he feels fine does not seem to have any symptoms at all.  Patient states has been urinating fine no decrease in his urine.  Patient is also on Aldactone.  This could be partially responsible.  And he is on potassium supplementation.  So since he is on potassium supplementation this could be the cause.        Past Medical History:  Diagnosis Date  . Hypertension   . Obesity   . OSA (obstructive sleep apnea) 11/05/2014   02/2015 -severe -AHI 106/h     Patient Active Problem List   Diagnosis Date Noted  . Acute exacerbation of CHF (congestive heart failure) (HCC) 07/15/2019  . Lipodystrophy 10/21/2018  . Obesity (BMI 30-39.9) 10/06/2018  . Chronic systolic heart failure (HCC) 02/17/2018  . Fatigue 02/17/2018  . Cardiomyopathy (HCC) 02/11/2018  . Hypertensive crisis 02/04/2018  . Chest pain 02/04/2018  . CKD (chronic kidney disease), stage II 02/04/2018  . Allergic rhinitis 01/21/2018  . Hyperglycemia 12/02/2017  . Hypokalemia 12/02/2017  . Cough 12/02/2017  . Patellar tendonitis of left knee 12/02/2017  . Preventative health care 12/10/2016  . Obesity 05/25/2016  . Lower leg edema 12/12/2014  . Essential hypertension 11/05/2014  . OSA (obstructive sleep apnea) 11/05/2014    Past Surgical History:  Procedure Laterality Date  . HERNIA REPAIR    . RIGHT/LEFT HEART CATH AND CORONARY  ANGIOGRAPHY N/A 02/07/2018   Procedure: RIGHT/LEFT HEART CATH AND CORONARY ANGIOGRAPHY;  Surgeon: Marykay Lex, MD;  Location: [facility] INVASIVE CV LAB;  Service: Cardiovascular;  Laterality: N/A;       Family History  Problem Relation Age of Onset  . Hypertension Mother   . Heart disease Maternal Grandfather   . Hypertension Maternal Grandfather   . Hypertension Sister     Social History   Tobacco Use  . Smoking status: Never Smoker  . Smokeless tobacco: Never Used  Substance Use Topics  . Alcohol use: No  . Drug use: No    Home Medications Prior to Admission medications   Medication Sig Start Date End Date Taking? Authorizing Provider  carvedilol (COREG) 25 MG tablet Take 1 tablet (25 mg total) by mouth 2 (two) times daily with a meal. 08/18/19   Bensimhon, Bevelyn Buckles, MD  hydrALAZINE (APRESOLINE) 100 MG tablet Take 1 tablet (100 mg total) by mouth every 8 (eight) hours. 10/13/19   Bensimhon, Bevelyn Buckles, MD  ibuprofen (ADVIL) 600 MG tablet Take 1 tablet (600 mg total) by mouth every 6 (six) hours as needed. 08/31/19   Mickie Bail, NP  isosorbide mononitrate (IMDUR) 60 MG 24 hr tablet Take 1 tablet (60 mg total) by mouth daily. 08/18/19   Bensimhon, Bevelyn Buckles, MD  potassium chloride SA (KLOR-CON) 20 MEQ tablet Take 3 tablets (60 mEq total) by mouth daily. 08/18/19  Bensimhon, Shaune Pascal, MD  sacubitril-valsartan (ENTRESTO) 49-51 MG Take 1 tablet by mouth 2 (two) times daily. 11/10/19   Bensimhon, Shaune Pascal, MD  spironolactone (ALDACTONE) 50 MG tablet Take 1 tablet (50 mg total) by mouth daily. 11/02/19   Bensimhon, Shaune Pascal, MD  torsemide (DEMADEX) 20 MG tablet Take 2 tablets (40 mg total) by mouth daily. 08/18/19   Bensimhon, Shaune Pascal, MD    Allergies    Patient has no known allergies.  Review of Systems   Review of Systems  Constitutional: Negative for chills and fever.  HENT: Negative for congestion, rhinorrhea and sore throat.   Eyes: Negative for visual disturbance.    Respiratory: Negative for cough and shortness of breath.   Cardiovascular: Negative for chest pain and leg swelling.  Gastrointestinal: Negative for abdominal pain, diarrhea, nausea and vomiting.  Genitourinary: Negative for dysuria.  Musculoskeletal: Negative for back pain and neck pain.  Skin: Negative for rash.  Neurological: Negative for dizziness, light-headedness and headaches.  Hematological: Does not bruise/bleed easily.  Psychiatric/Behavioral: Negative for confusion.    Physical Exam Updated Vital Signs BP (!) 108/54   Pulse 82   Temp 97.8 F (36.6 C) (Oral)   Resp 20   Wt 108.4 kg   SpO2 98%   BMI 38.58 kg/m   Physical Exam Vitals and nursing note reviewed.  Constitutional:      General: He is not in acute distress.    Appearance: Normal appearance. He is well-developed.  HENT:     Head: Normocephalic and atraumatic.  Eyes:     Conjunctiva/sclera: Conjunctivae normal.  Cardiovascular:     Rate and Rhythm: Normal rate and regular rhythm.     Heart sounds: No murmur.  Pulmonary:     Effort: Pulmonary effort is normal. No respiratory distress.     Breath sounds: Normal breath sounds.  Abdominal:     Palpations: Abdomen is soft.     Tenderness: There is no abdominal tenderness.  Musculoskeletal:        General: Normal range of motion.     Cervical back: Neck supple.  Skin:    General: Skin is warm and dry.  Neurological:     General: No focal deficit present.     Mental Status: He is alert and oriented to person, place, and time.     ED Results / Procedures / Treatments   Labs (all labs ordered are listed, but only abnormal results are displayed) Labs Reviewed  CBC - Abnormal; Notable for the following components:      Result Value   RBC 4.09 (*)    Hemoglobin 10.9 (*)    HCT 33.7 (*)    RDW 20.8 (*)    All other components within normal limits  SARS CORONAVIRUS 2 (TAT 6-24 HRS)  COMPREHENSIVE METABOLIC PANEL  BRAIN NATRIURETIC PEPTIDE    Results for orders placed or performed during the hospital encounter of 11/10/19  CBC  Result Value Ref Range   WBC 9.5 4.0 - 10.5 K/uL   RBC 4.09 (L) 4.22 - 5.81 MIL/uL   Hemoglobin 10.9 (L) 13.0 - 17.0 g/dL   HCT 33.7 (L) 39.0 - 52.0 %   MCV 82.4 80.0 - 100.0 fL   MCH 26.7 26.0 - 34.0 pg   MCHC 32.3 30.0 - 36.0 g/dL   RDW 20.8 (H) 11.5 - 15.5 %   Platelets 274 150 - 400 K/uL   nRBC 0.2 0.0 - 0.2 %  Comprehensive metabolic panel  Result  Value Ref Range   Sodium 135 135 - 145 mmol/L   Potassium >7.5 (HH) 3.5 - 5.1 mmol/L   Chloride 107 98 - 111 mmol/L   CO2 17 (L) 22 - 32 mmol/L   Glucose, Bld 102 (H) 70 - 99 mg/dL   BUN 95 (H) 6 - 20 mg/dL   Creatinine, Ser 6.37 (H) 0.61 - 1.24 mg/dL   Calcium 9.7 8.9 - 85.8 mg/dL   Total Protein 8.1 6.5 - 8.1 g/dL   Albumin 4.3 3.5 - 5.0 g/dL   AST 18 15 - 41 U/L   ALT 18 0 - 44 U/L   Alkaline Phosphatase 43 38 - 126 U/L   Total Bilirubin 0.4 0.3 - 1.2 mg/dL   GFR calc non Af Amer 11 (L) >60 mL/min   GFR calc Af Amer 13 (L) >60 mL/min   Anion gap 11 5 - 15  CBG monitoring, ED  Result Value Ref Range   Glucose-Capillary 129 (H) 70 - 99 mg/dL     EKG EKG Interpretation  Date/Time:  Friday November 10 2019 13:38:35 EST Ventricular Rate:  83 PR Interval:  180 QRS Duration: 106 QT Interval:  350 QTC Calculation: 411 R Axis:   40 Text Interpretation: Normal sinus rhythm T wave abnormality, consider lateral ischemia Abnormal ECG Confirmed by Vanetta Mulders [phone]) on 11/10/2019 2:19:32 PM   Radiology DG Knee Complete 4 Views Left  Result Date: 11/08/2019 CLINICAL DATA:  Pain EXAM: LEFT KNEE - COMPLETE 4+ VIEW COMPARISON:  None. FINDINGS: No evidence of fracture, dislocation, or joint effusion. No evidence of arthropathy or other focal bone abnormality. Soft tissues are unremarkable. There is a small suprapatellar joint effusion. IMPRESSION: Small suprapatellar joint effusion. No acute bony abnormality. Electronically Signed   By:  Katherine Mantle M.D.   On: 11/08/2019 15:45    Procedures Procedures (including critical care time)  CRITICAL CARE Performed by: Vanetta Mulders Total critical care time: 30 minutes Critical care time was exclusive of separately billable procedures and treating other patients. Critical care was necessary to treat or prevent imminent or life-threatening deterioration. Critical care was time spent personally by me on the following activities: development of treatment plan with patient and/or surrogate as well as nursing, discussions with consultants, evaluation of patient's response to treatment, examination of patient, obtaining history from patient or surrogate, ordering and performing treatments and interventions, ordering and review of laboratory studies, ordering and review of radiographic studies, pulse oximetry and re-evaluation of patient's condition.   Medications Ordered in ED Medications  0.9 %  sodium chloride infusion (has no administration in time range)  sodium chloride 0.9 % bolus 250 mL (has no administration in time range)  dextrose 50 % solution 50 mL (has no administration in time range)  insulin aspart (novoLOG) injection 5 Units (has no administration in time range)  sodium polystyrene (KAYEXALATE) 15 GM/60ML suspension 45 g (has no administration in time range)  calcium gluconate 1 g/ 50 mL sodium chloride IVPB (has no administration in time range)    ED Course  I have reviewed the triage vital signs and the nursing notes.  Pertinent labs & imaging results that were available during my care of the patient were reviewed by me and considered in my medical decision making (see chart for details).    MDM Rules/Calculators/A&P                      Patient on Demadex and on potassium supplementation.  So  the hyperkalemia could be iatrogenic.  But it is still significantly elevated.  Repeated here we got greater than 7.5.  Patient is EKG and cardiac monitoring had no  peak T waves no widening of the QRS.  However patient given calcium gluconate to protect the heart.  Given D50 along with 10 units of insulin to help bring the potassium down.  Started on Kayexalate.  And also started on Lokelma.  Discussed with nephrology they will follow.  Discussed with the hospitalist they will come and see and admit.     Final Clinical Impression(s) / ED Diagnoses Final diagnoses:  AKI (acute kidney injury) (HCC)  Hyperkalemia    Rx / DC Orders ED Discharge Orders    None       Vanetta Mulders, MD 11/10/19 1707

## 2019-11-10 NOTE — Addendum Note (Signed)
Addended by: Red Christians A on: 11/10/2019 12:31 PM   Modules accepted: Orders

## 2019-11-10 NOTE — ED Notes (Signed)
Call back to 6E to give report

## 2019-11-10 NOTE — ED Notes (Signed)
Pt ambulates to the bathroom for BM again (this is his third trip to the bathroom for a BM)

## 2019-11-10 NOTE — Consult Note (Signed)
[facility]  INPATIENT CONSULTATION  Reason for Consultation: AKI, hyperkalemia Requesting Provider: Dr. Deretha Emory  HPI: Steven Chase is an 34 y.o. male with NICM, OSA, obesity, HTN who is seen for eval and management of AKI and hyperkalemia.    Pt last admitted with CP, uncontolled HTN, acute CHF 07/2019. Initially dx with NICM 02/2015.  During that admission entresto titrated, diuresed.  He presented to routine f/u in heart failure clinic today feeling well.  EF noted 45-50% on TTE per Dr. Gala Romney.  BP 98/63 wt 239lbs.  He felt and appeared well but BP was noted to be lower than typical for him.  Labs showed K 7.2, Cr 5.6 and he was dispatched to the ED where labs showed K 7.5, Cr 6.1.    He was given kayexelate 45g, NS bolus running currently.   He denies orthostatic symptoms, poor po intake. Notes normal urine outpt in volume and character.  No LUTs.  +NSAIDS TID for past few days for knee pain (Rx'd at recent ED visit).  No myalgias, fevers, chills.  No recent med changes.   PMH: Past Medical History:  Diagnosis Date  . Hypertension   . Obesity   . OSA (obstructive sleep apnea) 11/05/2014   02/2015 -severe -AHI 106/h    PSH: Past Surgical History:  Procedure Laterality Date  . HERNIA REPAIR    . RIGHT/LEFT HEART CATH AND CORONARY ANGIOGRAPHY N/A 02/07/2018   Procedure: RIGHT/LEFT HEART CATH AND CORONARY ANGIOGRAPHY;  Surgeon: Marykay Lex, MD;  Location: [facility] INVASIVE CV LAB;  Service: Cardiovascular;  Laterality: N/A;    Past Medical History:  Diagnosis Date  . Hypertension   . Obesity   . OSA (obstructive sleep apnea) 11/05/2014   02/2015 -severe -AHI 106/h     Medications:  I have reviewed the patient's current medications.  (Not in a hospital admission)   ALLERGIES:  No Known Allergies  FAM HX: Family History  Problem Relation Age of Onset  . Hypertension Mother   . Heart disease Maternal Grandfather   . Hypertension Maternal Grandfather    . Hypertension Sister     Social History:   reports that he has never smoked. He has never used smokeless tobacco. He reports that he does not drink alcohol or use drugs.  ROS: 12 system ROS negative per HPI above.  Blood pressure (!) 108/54, pulse 82, temperature 97.8 F (36.6 C), temperature source Oral, resp. rate 20, weight 108.4 kg, SpO2 98 %. PHYSICAL EXAM: Gen: obese young man who is not In distress  Eyes:  Anicteric, EOMI ENT: MMM Neck: thick CV:  RRR, no rub Abd:  Soft, obese Lungs: clear to bases Back: no TTP GU: no foley Extr:  No edema Neuro: nonfocal, no asterixis   Results for orders placed or performed during the hospital encounter of 11/10/19 (from the past 48 hour(s))  CBC     Status: Abnormal   Collection Time: 11/10/19  1:42 PM  Result Value Ref Range   WBC 9.5 4.0 - 10.5 K/uL   RBC 4.09 (L) 4.22 - 5.81 MIL/uL   Hemoglobin 10.9 (L) 13.0 - 17.0 g/dL   HCT 56.2 (L) 13.0 - 86.5 %   MCV 82.4 80.0 - 100.0 fL   MCH 26.7 26.0 - 34.0 pg   MCHC 32.3 30.0 - 36.0 g/dL   RDW 78.4 (H) 69.6 - 29.5 %   Platelets 274 150 - 400 K/uL   nRBC 0.2 0.0 - 0.2 %  Comment: Performed at [facility], [place] [address], [place], [place] [address]  Comprehensive metabolic panel     Status: Abnormal   Collection Time: 11/10/19  1:42 PM  Result Value Ref Range   Sodium 135 135 - 145 mmol/L   Potassium >7.5 (HH) 3.5 - 5.1 mmol/L    Comment: NO VISIBLE HEMOLYSIS CRITICAL RESULT CALLED TO, READ BACK BY AND VERIFIED WITH: FLUECKIGER,E RN @ 1515 11/10/19 LEONARD,A    Chloride 107 98 - 111 mmol/L   CO2 17 (L) 22 - 32 mmol/L   Glucose, Bld 102 (H) 70 - 99 mg/dL   BUN 95 (H) 6 - 20 mg/dL   Creatinine, Ser 6.06 (H) 0.61 - 1.24 mg/dL   Calcium 9.7 8.9 - 10.3 mg/dL   Total Protein 8.1 6.5 - 8.1 g/dL   Albumin 4.3 3.5 - 5.0 g/dL   AST 18 15 - 41 U/L   ALT 18 0 - 44 U/L   Alkaline Phosphatase 43 38 - 126 U/L   Total Bilirubin 0.4 0.3 - 1.2 mg/dL   GFR calc non Af Amer 11 (L)  >60 mL/min   GFR calc Af Amer 13 (L) >60 mL/min   Anion gap 11 5 - 15    Comment: Performed at [facility], [place] [address], [place],  [address]  CBG monitoring, ED     Status: Abnormal   Collection Time: 11/10/19  3:26 PM  Result Value Ref Range   Glucose-Capillary 129 (H) 70 - 99 mg/dL    No results found.  Assessment/Plan **AKI: severe.  I suspect this is multifactorial with factors including modest hypotension, entresto, spironolactone, recent heavy NSAID use.  He's still reporting normal UOP and in light of his EF by TTE today in cardiology clinic being nearly normal will give additional 1L NS bolus.  Hold entresto and spironolactone for now.  I think he will improve with these measures, but if he doesn't may need [temporary] dialysis to bridge to recovery.  **Hyperkalemia, severe: no EKG changes.  Given insulin, dextrose, IV ca.  Had BM from kayexelate just prior to my interview, also rec'd lokelma.  Per above 1L NS will help, could chase with lasix IV to promote K excretion if needed.   Repeat K in 2 hrs then q4h until improving.   **Anemia:  Hb was 16-17s during 07/2019 admission, noted to be 10.9 today (WBC and plt ok).  CTM.   **OSA: CPAP qhs  **Obesity  Justin Mend 11/10/2019, 3:44 PM

## 2019-11-10 NOTE — ED Notes (Signed)
Date and time results received: 11/10/19 3:15 PM . Test:K Critical Value: greater than 7.5  Name of Provider Notified: Deretha Emory

## 2019-11-10 NOTE — Progress Notes (Signed)
  Echocardiogram 2D Echocardiogram has been performed.  Janalyn Harder 11/10/2019, 9:43 AM

## 2019-11-10 NOTE — ED Notes (Signed)
Pt ambulated to the bathroom to have a BM again

## 2019-11-10 NOTE — ED Triage Notes (Signed)
Pt had blood done today by heart failure clininc and his K was high (he believes they said it was over 7) and he was told to come to ED immediately.  Pt is not on HD.  No CP or sob.

## 2019-11-10 NOTE — Patient Instructions (Addendum)
STOP Digoxin  DECREASE Entresto to 49/51mg  ( 1 tab) twice a day  Labs today We will only contact you if something comes back abnormal or we need to make some changes. Otherwise no news is good news!  You have been referred to the bariatric program.  They will call you to schedule this appointment.   Your physician recommends that you schedule a follow-up appointment in: 4 months with Dr Gala Romney   Please call office at [phone] option [phone] if you have any questions or concerns.   At the [facility], you and your health needs are our priority. As part of our continuing mission to provide you with exceptional heart care, we have created designated Provider Care Teams. These Care Teams include your primary Cardiologist (physician) and Advanced Practice Providers (APPs- Physician Assistants and Nurse Practitioners) who all work together to provide you with the care you need, when you need it.   You may see any of the following providers on your designated Care Team at your next follow up: Marland Kitchen Dr Arvilla Meres . Dr Marca Ancona . Tonye Becket, NP . [name], PA . Karle Plumber, PharmD   Please be sure to bring in all your medications bottles to every appointment.

## 2019-11-10 NOTE — Telephone Encounter (Signed)
Revised orders for stat labs to be drawn per Dr. Gala Romney.

## 2019-11-10 NOTE — H&P (Signed)
[facility] History and Physical  AADIT HORNSBY RCV:[id number] DOB: Sep 20, 1986 DOA: 11/10/2019  Referring physician: ED  PCP: Corwin Levins, MD   Chief Complaint: Elevated potassium levels  HPI: Steven Chase is a 34 y.o. male with history of systolic congestive heart failure, hypertension, chronic kidney disease stage II, obstructive sleep apnea, morbid obesity was seen in to the ER for elevated potassium levels more than 7.  Patient was seen the heart failure clinic where he was noted to have hyperkalemia.  Of note, patient was on torsemide for CHF and had been having good diuresis at home.  He was also on Entresto, potassium supplements and spirinolactone at home as per prelim med rec.  Denies Shortness of breath, paroxysmal nocturnal dyspnea, orthopnea or increasing edema.  He has been using CPAP for his obstructive sleep apnea.  No noncompliance to medication  reported.  Patient denies any nausea, vomiting or diarrhea.  Denies any dizziness, lightheadedness or syncope.  Denies any chest pain, palpitation.  Denies any sick contacts or recent travel.  Denies any urinary urgency, frequency or dysuria.  Patient denies any fever, chills or rigor.  ED Course: In the ED, patient had a normal-looking EKG but received temporizing measures with calcium gluconate dextrose 50% 50 mL insulin IV and received Lokelma and Kayexalate for severe hyperkalemia.  Nephrology was consulted from the ED.  Patient also received 250 mL IV fluid bolus followed by 75 mL/h.  Review of Systems:  All systems were reviewed and were negative unless otherwise mentioned in the HPI  Past Medical History:  Diagnosis Date  . CHF (congestive heart failure) (HCC)   . Hypertension   . Obesity   . OSA (obstructive sleep apnea) 11/05/2014   02/2015 -severe -AHI 106/h    Past Surgical History:  Procedure Laterality Date  . HERNIA REPAIR    . RIGHT/LEFT HEART CATH AND CORONARY ANGIOGRAPHY N/A 02/07/2018   Procedure:  RIGHT/LEFT HEART CATH AND CORONARY ANGIOGRAPHY;  Surgeon: Marykay Lex, MD;  Location: [facility] INVASIVE CV LAB;  Service: Cardiovascular;  Laterality: N/A;    Social History:  reports that he has never smoked. He has never used smokeless tobacco. He reports that he does not drink alcohol or use drugs.  No Known Allergies  Family History  Problem Relation Age of Onset  . Hypertension Mother   . Heart disease Maternal Grandfather   . Hypertension Maternal Grandfather   . Hypertension Sister      Prior to Admission medications   Medication Sig Start Date End Date Taking? Authorizing Provider  carvedilol (COREG) 25 MG tablet Take 1 tablet (25 mg total) by mouth 2 (two) times daily with a meal. 08/18/19   Bensimhon, Bevelyn Buckles, MD  hydrALAZINE (APRESOLINE) 100 MG tablet Take 1 tablet (100 mg total) by mouth every 8 (eight) hours. 10/13/19   Bensimhon, Bevelyn Buckles, MD  ibuprofen (ADVIL) 600 MG tablet Take 1 tablet (600 mg total) by mouth every 6 (six) hours as needed. 08/31/19   Mickie Bail, NP  isosorbide mononitrate (IMDUR) 60 MG 24 hr tablet Take 1 tablet (60 mg total) by mouth daily. 08/18/19   Bensimhon, Bevelyn Buckles, MD  potassium chloride SA (KLOR-CON) 20 MEQ tablet Take 3 tablets (60 mEq total) by mouth daily. 08/18/19   Bensimhon, Bevelyn Buckles, MD  sacubitril-valsartan (ENTRESTO) 49-51 MG Take 1 tablet by mouth 2 (two) times daily. 11/10/19   Bensimhon, Bevelyn Buckles, MD  spironolactone (ALDACTONE) 50 MG tablet Take 1 tablet (50  mg total) by mouth daily. 11/02/19   Bensimhon, Bevelyn Buckles, MD  torsemide (DEMADEX) 20 MG tablet Take 2 tablets (40 mg total) by mouth daily. 08/18/19   Bensimhon, Bevelyn Buckles, MD    Physical Exam: Vitals:   11/10/19 1340 11/10/19 1341 11/10/19 1422 11/10/19 1544  BP: (!) 90/52  (!) 108/54 (!) 92/57  Pulse: 82  82   Resp: 16  20 19   Temp: 97.8 F (36.6 C)     TempSrc: Oral     SpO2: 98%  98%   Weight:  108.4 kg     Wt Readings from Last 3 Encounters:  11/10/19 108.4 kg    11/10/19 108.6 kg  11/08/19 108 kg   Body mass index is 38.58 kg/m.  General: Morbidly obese, not in obvious distress HENT: Normocephalic, pupils equally reacting to light and accommodation.  No scleral pallor or icterus noted. Oral mucosa is moist.  Chest:  Clear breath sounds.  Diminished breath sounds bilaterally. No crackles or wheezes.  CVS: S1 &S2 heard. No murmur.  Regular rate and rhythm. Abdomen: Soft, nontender, nondistended. Obese abdomen.  Bowel sounds are heard.  Extremities: No cyanosis, clubbing but trace edema.  Peripheral pulses are palpable. Psych: Alert, awake and oriented, normal mood CNS:  No cranial nerve deficits.  Power equal in all extremities.   No cerebellar signs.   Skin: Warm and dry.  No rashes noted.  Labs on Admission:   CBC: Recent Labs  Lab 11/10/19 1342  WBC 9.5  HGB 10.9*  HCT 33.7*  MCV 82.4  PLT 274    Basic Metabolic Panel: Recent Labs  Lab 11/10/19 1027 11/10/19 1230 11/10/19 1342  NA 135 133* 135  K 7.2* 7.1* >7.5*  CL 108 108 107  CO2 16* 16* 17*  GLUCOSE 108* 131* 102*  BUN 90* 94* 95*  CREATININE 5.60* 5.97* 6.06*  CALCIUM 9.7 9.5 9.7    Liver Function Tests: Recent Labs  Lab 11/10/19 1342  AST 18  ALT 18  ALKPHOS 43  BILITOT 0.4  PROT 8.1  ALBUMIN 4.3   No results for input(s): LIPASE, AMYLASE in the last 168 hours. No results for input(s): AMMONIA in the last 168 hours.  Cardiac Enzymes: No results for input(s): CKTOTAL, CKMB, CKMBINDEX, TROPONINI in the last 168 hours.  BNP (last 3 results) Recent Labs    07/15/19 1927 11/10/19 1027  BNP 1,024.4* 9.2    ProBNP (last 3 results) No results for input(s): PROBNP in the last 8760 hours.  CBG: Recent Labs  Lab 11/10/19 1526  GLUCAP 129*    Lipase     Component Value Date/Time   LIPASE 32 07/15/2019 1729     Urinalysis    Component Value Date/Time   COLORURINE YELLOW 07/15/2019 1930   APPEARANCEUR CLEAR 07/15/2019 1930   LABSPEC 1.030  07/15/2019 1930   PHURINE 5.0 07/15/2019 1930   GLUCOSEU NEGATIVE 07/15/2019 1930   HGBUR SMALL (A) 07/15/2019 1930   BILIRUBINUR NEGATIVE 07/15/2019 1930   KETONESUR NEGATIVE 07/15/2019 1930   PROTEINUR >=300 (A) 07/15/2019 1930   UROBILINOGEN 0.2 01/12/2018 1038   NITRITE NEGATIVE 07/15/2019 1930   LEUKOCYTESUR NEGATIVE 07/15/2019 1930     Drugs of Abuse     Component Value Date/Time   LABOPIA NONE DETECTED 02/05/2018 0647   COCAINSCRNUR NONE DETECTED 02/05/2018 0647   LABBENZ NONE DETECTED 02/05/2018 0647   AMPHETMU NONE DETECTED 02/05/2018 0647   THCU NONE DETECTED 02/05/2018 0647   LABBARB NONE DETECTED 02/05/2018 04/07/2018  Radiological Exams on Admission: ECHOCARDIOGRAM COMPLETE  Result Date: 11/10/2019   ECHOCARDIOGRAM REPORT   Patient Name:   Lucilla Edin Date of Exam: 11/10/2019 Medical Rec #:  [id number]       Height:       66.0 in Accession #:    [id number]      Weight:       238.0 lb Date of Birth:  08/28/1986      BSA:          2.15 m Patient Age:    33 years        BP:           97/58 mmHg Patient Gender: M               HR:           88 bpm. Exam Location:  Outpatient Procedure: 2D Echo, Cardiac Doppler, Color Doppler and Strain Analysis Indications:    I50.40* Unspecified combined systolic (congestive) and diastolic                 (congestive) heart failure  History:        Patient has prior history of Echocardiogram examinations, most                 recent 07/16/2019. Cardiomyopathy and CHF, Signs/Symptoms:Chest                 Pain; Risk Factors:Hypertension and Sleep Apnea. Edema.  Sonographer:    Roseanna Rainbow Referring Phys: [phone] AMY D CLEGG  Sonographer Comments: Technically difficult study due to poor echo windows and patient is morbidly obese. Image acquisition challenging due to patient body habitus. IMPRESSIONS  1. Left ventricular ejection fraction, by visual estimation, is 40 to 45%. The left ventricle has moderately decreased function. There is severely increased  left ventricular hypertrophy.  2. Left ventricular diastolic parameters are consistent with Grade I diastolic dysfunction (impaired relaxation).  3. The left ventricle demonstrates regional wall motion abnormalities.  4. Poor acoustic windows limit study LVEF appears improved compared to report from September 2020.  5. Global right ventricle has moderately reduced systolic function.The right ventricular size is normal. No increase in right ventricular wall thickness.  6. Left atrial size was normal.  7. Right atrial size was normal.  8. The mitral valve is normal in structure. No evidence of mitral valve regurgitation.  9. The tricuspid valve is normal in structure. 10. The aortic valve is tricuspid. Aortic valve regurgitation is not visualized. Mild aortic valve sclerosis without stenosis. 11. The pulmonic valve was normal in structure. Pulmonic valve regurgitation is not visualized. 12. The inferior vena cava is normal in size with greater than 50% respiratory variability, suggesting right atrial pressure of 3 mmHg. 13. Poor acoustic windows limit study. FINDINGS  Left Ventricle: Left ventricular ejection fraction, by visual estimation, is 40 to 45%. The left ventricle has moderately decreased function. The left ventricle demonstrates regional wall motion abnormalities. The left ventricular internal cavity size was the left ventricle is normal in size. There is severely increased left ventricular hypertrophy. Left ventricular diastolic parameters are consistent with Grade I diastolic dysfunction (impaired relaxation). Normal left atrial pressure. Poor acoustic windows limit study LVEF appears improved compared to report from September 2020. Right Ventricle: The right ventricular size is normal. No increase in right ventricular wall thickness. Global RV systolic function is has moderately reduced systolic function. Left Atrium: Left atrial size was normal in size. Right Atrium: Right atrial size  was normal in size  Pericardium: There is no evidence of pericardial effusion. Mitral Valve: The mitral valve is normal in structure. No evidence of mitral valve regurgitation. Tricuspid Valve: The tricuspid valve is normal in structure. Tricuspid valve regurgitation is trivial. Aortic Valve: The aortic valve is tricuspid. Aortic valve regurgitation is not visualized. Mild aortic valve sclerosis is present, with no evidence of aortic valve stenosis. Pulmonic Valve: The pulmonic valve was normal in structure. Pulmonic valve regurgitation is not visualized. Pulmonic regurgitation is not visualized. Aorta: The aortic root is normal in size and structure. Venous: The inferior vena cava is normal in size with greater than 50% respiratory variability, suggesting right atrial pressure of 3 mmHg. IAS/Shunts: No atrial level shunt detected by color flow Doppler. Additional Comments: Poor acoustic windows limit study.  LEFT VENTRICLE PLAX 2D LVIDd:         4.15 cm       Diastology LVIDs:         3.45 cm       LV e' lateral:   4.28 cm/s LV PW:         1.80 cm       LV E/e' lateral: 12.9 LV IVS:        1.90 cm       LV e' medial:    5.10 cm/s LVOT diam:     2.30 cm       LV E/e' medial:  10.8 LV SV:         27 ml LV SV Index:   11.89 LVOT Area:     4.15 cm  LV Volumes (MOD) LV area d, A2C:    32.40 cm LV area d, A4C:    44.20 cm LV area s, A2C:    23.80 cm LV area s, A4C:    34.40 cm LV major d, A2C:   7.00 cm LV major d, A4C:   9.03 cm LV major s, A2C:   6.45 cm LV major s, A4C:   8.07 cm LV vol d, MOD A2C: 125.0 ml LV vol d, MOD A4C: 178.0 ml LV vol s, MOD A2C: 73.8 ml LV vol s, MOD A4C: 121.0 ml LV SV MOD A2C:     51.2 ml LV SV MOD A4C:     178.0 ml LV SV MOD BP:      63.7 ml RIGHT VENTRICLE             IVC RV S prime:     12.30 cm/s  IVC diam: 1.00 cm TAPSE (M-mode): 2.2 cm LEFT ATRIUM             Index       RIGHT ATRIUM           Index LA diam:        3.80 cm 1.76 cm/m  RA Area:     10.30 cm LA Vol (A2C):   57.1 ml 26.52 ml/m RA  Volume:   17.90 ml  8.31 ml/m LA Vol (A4C):   37.2 ml 17.28 ml/m LA Biplane Vol: 49.7 ml 23.08 ml/m  AORTIC VALVE LVOT Vmax:   76.60 cm/s LVOT Vmean:  48.200 cm/s LVOT VTI:    0.138 m  AORTA Ao Root diam: 3.30 cm Ao Asc diam:  2.80 cm MITRAL VALVE MV Area (PHT): 5.13 cm             SHUNTS MV PHT:        42.92 msec  Systemic VTI:  0.14 m MV Decel Time: 148 msec             Systemic Diam: 2.30 cm MV E velocity: 55.30 cm/s 103 cm/s MV A velocity: 61.80 cm/s 70.3 cm/s MV E/A ratio:  0.89       1.5  Dietrich Pates MD Electronically signed by Dietrich Pates MD Signature Date/Time: 11/10/2019/4:32:25 PM    Final     EKG: Personally reviewed by me which shows normal sinus rhythm  Assessment/Plan  Principal Problem:   Hyperkalemia Active Problems:   Essential hypertension   OSA (obstructive sleep apnea)   Obesity   CKD (chronic kidney disease), stage II   Chronic systolic heart failure (HCC)   AKI (acute kidney injury) (HCC)   Severe Hyperkalemia.  Likely Secondary to Entresto, spironolactone, potassium supplements at home with acute kidney injury.  Patient received Lokelma, Kayexalate with temporizing measures in the ED.  Will closely monitor BMP.  Nephrology has been consulted.  Will follow recommendation.  On IV fluid hydration at this time.  Acute kidney injury on chronic kidney disease stage III.  Creatinine of 6.06 on presentation.  Creatinine 3 months ago was 1.4. Likely secondary to overdiuresis.  Nephrology to follow.  Continue IV fluid hydration with  Chronic systolic congestive heart failure.  Follows up at the congestive heart failure clinic .  Patient had a 2D echocardiogram done today, reported LVEF of 45-50%.  History of obstructive sleep apnea.  Uses CPAP at nighttime. Will resume  Essential hypertension. BP borderline low. Will hold spironolactone,diuretic for now-, will closely monitor. Will be on IV hydration.  DVT Prophylaxis: Heparin subcu  Consultant: Nephrology  Code  Status: Full code  Microbiology none  Antibiotics: None  Family Communication:  Patients' condition and plan of care including tests being ordered have been discussed with the patient who indicate understanding and agree with the plan.  Disposition Plan: Home  Severity of Illness: The appropriate patient status for this patient is INPATIENT. Inpatient status is judged to be reasonable and necessary in order to provide the required intensity of service to ensure the patient's safety. The patient's presenting symptoms, physical exam findings, and initial radiographic and laboratory data in the context of their chronic comorbidities is felt to place them at high risk for further clinical deterioration. Furthermore, it is not anticipated that the patient will be medically stable for discharge from the hospital within 2 midnights of admission. The following factors support the patient status of inpatient. I certify that at the point of admission it is my clinical judgment that the patient will require inpatient hospital care spanning beyond 2 midnights from the point of admission due to high intensity of service, high risk for further deterioration and high frequency of surveillance required.   Signed, Joycelyn Das, MD [facility] 11/10/2019

## 2019-11-10 NOTE — ED Notes (Signed)
Call to 6E.  RN to call me back.  Pt remains in bathroom

## 2019-11-10 NOTE — Telephone Encounter (Signed)
Called Patient to discuss Lab because K+ 7.2. Dr Gala Romney requested that he come back in immediately for STAT labs. He stated that he was on the way.

## 2019-11-10 NOTE — ED Notes (Signed)
Report give to [facility]

## 2019-11-10 NOTE — Progress Notes (Signed)
PCP: Dr Jonny Ruiz  Primary Cardiologist:Dr Nahser HF MD: Dr Gala Romney   HPI: Steven Chase is a 34 y.o. male with chronic NICM(EF 15-20% in 07/2018),poorly controlledHTN, OSA, and obesity  Admitted with CP and ADHF in setting of uncontrolled HTN/HTN crisis in 9/20 Prior to admit he had run out of a few of his HF medications. Diuresed with IV lasix + metolazone and later transitioned to torsemide 60 mg daily. Overall weight went down 13 pounds. HF meds adjusted with improvement in blood pressure. Entresto increased to 97-103 twice a day. Discharge weight was 242 pounds.   Today he returns for HF follow up.Overall feeling pretty good. Denies SOB/PND/Orthopnea. No edema. Using CPAP every night. Taking meds as prescribed. Followed by Paramedicine. Weight at home 234 -> 236 pounds. Walking around neighborhood. Working at [employer].  Echo today EF 45-50% Personally reviewed   ROS: All systems negative except as listed in HPI, PMH and Problem List.  SH:  Social History   Socioeconomic History  . Marital status: Single    Spouse name: Not on file  . Number of children: 1  . Years of education: 34  . Highest education level: Not on file  Occupational History  . 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 Smoking status: Never Smoker  . Smokeless tobacco: Never Used  Substance and Sexual Activity  . Alcohol use: No  . Drug use: No  . Sexual activity: Not on file  Other Topics Concern  . Not on file  Social History Narrative   Born and raised in [place], [place]. Currently live in a private residence with his mother. Fun: Play with his daughter.   Denies religious beliefs that would effect health care.    Social Determinants of Health   Financial Resource Strain: Low Risk   . Difficulty of Paying Living Expenses: Not very hard  Food Insecurity: Food Insecurity Present  . Worried About Programme researcher, broadcasting/film/video in the Last Year: Sometimes true  . Ran Out of  Food in the Last Year: Sometimes true  Transportation Needs: No Transportation Needs  . Lack of Transportation (Medical): No  . Lack of Transportation (Non-Medical): No  Physical Activity:   . Days of Exercise per Week: Not on file  . Minutes of Exercise per Session: Not on file  Stress:   . Feeling of Stress : Not on file  Social Connections:   . Frequency of Communication with Friends and Family: Not on file  . Frequency of Social Gatherings with Friends and Family: Not on file  . Attends Religious Services: Not on file  . Active Member of Clubs or Organizations: Not on file  . Attends Banker Meetings: Not on file  . Marital Status: Not on file  Intimate Partner Violence:   . Fear of Current or Ex-Partner: Not on file  . Emotionally Abused: Not on file  . Physically Abused: Not on file  . Sexually Abused: Not on file    FH:  Family History  Problem Relation Age of Onset  . Hypertension Mother   . Heart disease Maternal Grandfather   . Hypertension Maternal Grandfather   . Hypertension Sister     Past Medical History:  Diagnosis Date  . Hypertension   . Obesity   . OSA (obstructive sleep apnea) 11/05/2014   02/2015 -severe -AHI 106/h     Current Outpatient Medications  Medication Sig Dispense Refill  . carvedilol (COREG) 25 MG  tablet Take 1 tablet (25 mg total) by mouth 2 (two) times daily with a meal. 60 tablet 3  . digoxin (LANOXIN) 0.125 MG tablet Take 1 tablet (125 mcg total) by mouth daily. 30 tablet 3  . hydrALAZINE (APRESOLINE) 100 MG tablet Take 1 tablet (100 mg total) by mouth every 8 (eight) hours. 90 tablet 3  . ibuprofen (ADVIL) 600 MG tablet Take 1 tablet (600 mg total) by mouth every 6 (six) hours as needed. 30 tablet 0  . isosorbide mononitrate (IMDUR) 60 MG 24 hr tablet Take 1 tablet (60 mg total) by mouth daily. 30 tablet 3  . potassium chloride SA (KLOR-CON) 20 MEQ tablet Take 3 tablets (60 mEq total) by mouth daily. 90 tablet 3  .  sacubitril-valsartan (ENTRESTO) 49-51 MG Take 1 tablet by mouth 2 (two) times daily. 60 tablet 11  . spironolactone (ALDACTONE) 50 MG tablet Take 1 tablet (50 mg total) by mouth daily. 30 tablet 3  . torsemide (DEMADEX) 20 MG tablet Take 2 tablets (40 mg total) by mouth daily. 60 tablet 3   No current facility-administered medications for this encounter.    Vitals:   11/10/19 0951  BP: 98/63  Pulse: 88  SpO2: 94%  Weight: 108.6 kg (239 lb 6.4 oz)   Wt Readings from Last 3 Encounters:  11/10/19 108.6 kg (239 lb 6.4 oz)  11/08/19 108 kg (238 lb)  11/08/19 108.2 kg (238 lb 9.6 oz)     PHYSICAL EXAM: General:  Well appearing. No resp difficulty HEENT: normal Neck: supple. no JVD. Carotids 2+ bilat; no bruits. No lymphadenopathy or thryomegaly appreciated. Cor: PMI nondisplaced. Regular rate & rhythm. No rubs, gallops or murmurs. Lungs: clear Abdomen: obese soft, nontender, nondistended. No hepatosplenomegaly. No bruits or masses. Good bowel sounds. Extremities: no cyanosis, clubbing, rash, edema Neuro: alert & orientedx3, cranial nerves grossly intact. moves all 4 extremities w/o difficulty. Affect pleasant   ASSESSMENT & PLAN: 1.Chronic systolic [facility] setting of hypertensive crisis and medication non-complaince - NICM by cath 02/07/18, with low cardiac output. Suspected primarily HTN cardiomyopathy -Echo4/04/2018 LVEF 15%, Grade 2 DD, Trivial AI, Mild MR, Mild LAE, PA peak pressure 38 mm Hg, small pericardial effusion noted. - cMRI4/9/19 LVEF 19%, Normal RV, Discrete nodule in mid inferior wall concerning for possible Sarcoid involvement. No amyloid. - Echo 07/2018 EF 25%. Referred to EP for ICD, but canceled appointment. He is not interested in ICD. - Echo 07/16/19 EF 20% mild RV HK - Echo today 11/10/19 EF 45-50% (much improved) - NYHA II. Volume status stable. Continue torsemide 40 mg daily. (with normalization of EF can cut back diuretics as tolerated) - Continue carvedilol  25 bid. -Continue hydralazine 100 mg three times a day + IMdur 60 mg daily. Arlyce Harman 50 daily  - Now on entresto 97/103 mg twice a day. BP soft. Previously did not tolerate higher dose due to low BP. Will back down to 49/51 - Check BMET today.  - Stop digoxin.   3.HTN  - BP well controlled and in fact low today.   4.OSA - Sleep study 03/30/18 with severe OSA. AHI 81.4 with sats down to 63% - Continue CPAP nightly.    5. CKD III -Baseline 1.3-1.5 - check BMET  6. Obesity Body mass index is 38.64 kg/m. - Discussed portion control and need for exercise.   D/w Paramedicine team   Glori Bickers MD  10:15 AM

## 2019-11-10 NOTE — ED Notes (Signed)
6 

## 2019-11-11 DIAGNOSIS — N1831 Chronic kidney disease, stage 3a: Secondary | ICD-10-CM

## 2019-11-11 DIAGNOSIS — I428 Other cardiomyopathies: Secondary | ICD-10-CM

## 2019-11-11 DIAGNOSIS — N179 Acute kidney failure, unspecified: Principal | ICD-10-CM

## 2019-11-11 DIAGNOSIS — I5022 Chronic systolic (congestive) heart failure: Secondary | ICD-10-CM

## 2019-11-11 DIAGNOSIS — E875 Hyperkalemia: Secondary | ICD-10-CM

## 2019-11-11 LAB — CBC
HCT: 32.4 % — ABNORMAL LOW (ref 39.0–52.0)
Hemoglobin: 10.8 g/dL — ABNORMAL LOW (ref 13.0–17.0)
MCH: 26.6 pg (ref 26.0–34.0)
MCHC: 33.3 g/dL (ref 30.0–36.0)
MCV: 79.8 fL — ABNORMAL LOW (ref 80.0–100.0)
Platelets: 271 10*3/uL (ref 150–400)
RBC: 4.06 MIL/uL — ABNORMAL LOW (ref 4.22–5.81)
RDW: 20.4 % — ABNORMAL HIGH (ref 11.5–15.5)
WBC: 10.1 10*3/uL (ref 4.0–10.5)
nRBC: 0 % (ref 0.0–0.2)

## 2019-11-11 LAB — POTASSIUM: Potassium: 5.1 mmol/L (ref 3.5–5.1)

## 2019-11-11 LAB — COMPREHENSIVE METABOLIC PANEL
ALT: 23 U/L (ref 0–44)
AST: 23 U/L (ref 15–41)
Albumin: 4.2 g/dL (ref 3.5–5.0)
Alkaline Phosphatase: 41 U/L (ref 38–126)
Anion gap: 11 (ref 5–15)
BUN: 83 mg/dL — ABNORMAL HIGH (ref 6–20)
CO2: 16 mmol/L — ABNORMAL LOW (ref 22–32)
Calcium: 9.3 mg/dL (ref 8.9–10.3)
Chloride: 107 mmol/L (ref 98–111)
Creatinine, Ser: 4.39 mg/dL — ABNORMAL HIGH (ref 0.61–1.24)
GFR calc Af Amer: 19 mL/min — ABNORMAL LOW (ref >60)
GFR calc non Af Amer: 16 mL/min — ABNORMAL LOW (ref >60)
Glucose, Bld: 95 mg/dL (ref 70–99)
Potassium: 5.4 mmol/L — ABNORMAL HIGH (ref 3.5–5.1)
Sodium: 134 mmol/L — ABNORMAL LOW (ref 135–145)
Total Bilirubin: 0.5 mg/dL (ref 0.3–1.2)
Total Protein: 7.9 g/dL (ref 6.5–8.1)

## 2019-11-11 LAB — BASIC METABOLIC PANEL
Anion gap: 6 (ref 5–15)
BUN: 70 mg/dL — ABNORMAL HIGH (ref 6–20)
CO2: 19 mmol/L — ABNORMAL LOW (ref 22–32)
Calcium: 9.1 mg/dL (ref 8.9–10.3)
Chloride: 108 mmol/L (ref 98–111)
Creatinine, Ser: 3.53 mg/dL — ABNORMAL HIGH (ref 0.61–1.24)
GFR calc Af Amer: 25 mL/min — ABNORMAL LOW (ref >60)
GFR calc non Af Amer: 21 mL/min — ABNORMAL LOW (ref >60)
Glucose, Bld: 102 mg/dL — ABNORMAL HIGH (ref 70–99)
Potassium: 5 mmol/L (ref 3.5–5.1)
Sodium: 133 mmol/L — ABNORMAL LOW (ref 135–145)

## 2019-11-11 LAB — PHOSPHORUS: Phosphorus: 5.5 mg/dL — ABNORMAL HIGH (ref 2.5–4.6)

## 2019-11-11 MED ORDER — ICOSAPENT ETHYL 1 G PO CAPS
2.0000 g | ORAL_CAPSULE | Freq: Two times a day (BID) | ORAL | Status: DC
  Administered 2019-11-11 – 2019-11-13 (×5): 2 g via ORAL
  Filled 2019-11-11 (×6): qty 2

## 2019-11-11 MED ORDER — ROSUVASTATIN CALCIUM 20 MG PO TABS
40.0000 mg | ORAL_TABLET | Freq: Every day | ORAL | Status: DC
  Administered 2019-11-11 – 2019-11-13 (×3): 40 mg via ORAL
  Filled 2019-11-11 (×3): qty 2

## 2019-11-11 MED ORDER — SODIUM ZIRCONIUM CYCLOSILICATE 10 G PO PACK
10.0000 g | PACK | Freq: Once | ORAL | Status: AC
  Administered 2019-11-11: 10 g via ORAL
  Filled 2019-11-11: qty 1

## 2019-11-11 MED ORDER — HYDROCODONE-ACETAMINOPHEN 5-325 MG PO TABS
1.0000 | ORAL_TABLET | Freq: Four times a day (QID) | ORAL | Status: DC | PRN
  Administered 2019-11-12 – 2019-11-13 (×5): 2 via ORAL
  Filled 2019-11-11 (×5): qty 2

## 2019-11-11 MED ORDER — MORPHINE SULFATE (PF) 2 MG/ML IV SOLN
2.0000 mg | Freq: Once | INTRAVENOUS | Status: AC
  Administered 2019-11-12: 2 mg via INTRAVENOUS
  Filled 2019-11-11: qty 1

## 2019-11-11 MED ORDER — CARVEDILOL 6.25 MG PO TABS
6.2500 mg | ORAL_TABLET | Freq: Two times a day (BID) | ORAL | Status: DC
  Administered 2019-11-11 – 2019-11-12 (×2): 6.25 mg via ORAL
  Filled 2019-11-11 (×2): qty 1

## 2019-11-11 MED ORDER — ACETAMINOPHEN 325 MG PO TABS
650.0000 mg | ORAL_TABLET | Freq: Four times a day (QID) | ORAL | Status: DC | PRN
  Administered 2019-11-11 (×2): 650 mg via ORAL
  Filled 2019-11-11 (×2): qty 2

## 2019-11-11 MED ORDER — HYDRALAZINE HCL 10 MG PO TABS
10.0000 mg | ORAL_TABLET | ORAL | Status: DC | PRN
  Administered 2019-11-12: 10 mg via ORAL
  Filled 2019-11-11: qty 1

## 2019-11-11 NOTE — Consult Note (Signed)
Advanced Heart Failure Team Consult Note   Primary Physician: Corwin Levins, MD PCP-Cardiologist:  Kristeen Miss, MD  Reason for Consultation: HF  HPI:    Steven Chase is seen today for evaluation of HF at the request of Dr. Roda Shutters.   Steven Chase is a 34 y.o. male with chronic NICM(EF 15-20% in 07/2018),poorly controlledHTN, OSA, and obesity  I saw him on 1/8 in HF Clinic and was doing well. Echo revealed recovered cardiac function with EF 45%. BP was soft and there was concern for overdiuresis. Labs checked.   Cr 5.97 and K 7.2. Specimen was noted to be lipemic  Sent to ER. ECG normal. HF meds held. Given IVF and lokelma.  Seen by Renal who also obtained history that patient was taking a lot of NSAIDs.   Feels better. Denies SOB, orthopnea or PND. C/o R elbow pain  Creatinine 4.4 today. TGs 1,040  Says he wants to go home.    Review of Systems: [y] = yes, [ ]  = no   . General: Weight gain [ ] ; Weight loss [ ] ; Anorexia [ ] ; Fatigue [ y]; Fever [ ] ; Chills [ ] ; Weakness [ ]   . Cardiac: Chest pain/pressure [ ] ; Resting SOB [ ] ; Exertional SOB [ ] ; Orthopnea [ ] ; Pedal Edema [ ] ; Palpitations [ ] ; Syncope [ ] ; Presyncope [ ] ; Paroxysmal nocturnal dyspnea[ ]   . Pulmonary: Cough [ ] ; Wheezing[ ] ; Hemoptysis[ ] ; Sputum [ ] ; Snoring [ ]   . GI: Vomiting[ ] ; Dysphagia[ ] ; Melena[ ] ; Hematochezia [ ] ; Heartburn[ ] ; Abdominal pain [ ] ; Constipation [ ] ; Diarrhea [ ] ; BRBPR [ ]   . GU: Hematuria[ ] ; Dysuria [ ] ; Nocturia[ ]   . Vascular: Pain in legs with walking [ ] ; Pain in feet with lying flat [ ] ; Non-healing sores [ ] ; Stroke [ ] ; TIA [ ] ; Slurred speech [ ] ;  . Neuro: Headaches[ ] ; Vertigo[ ] ; Seizures[ ] ; Paresthesias[ ] ;Blurred vision [ ] ; Diplopia [ ] ; Vision changes [ ]   . Ortho/Skin: Arthritis ]; Joint pain ]; Muscle pain [ ] ; Joint swelling [ ] ; Back Pain [ ] ; Rash [ ]   . Psych: Depression[ ] ; Anxiety[ ]   . Heme: Bleeding problems [ ] ; Clotting disorders [ ] ; Anemia [  ]  . Endocrine: Diabetes [ ] ; Thyroid dysfunction[ ]   Home Medications Prior to Admission medications   Medication Sig Start Date End Date Taking? Authorizing Provider  carvedilol (COREG) 25 MG tablet Take 1 tablet (25 mg total) by mouth 2 (two) times daily with a meal. 08/18/19  Yes Brieonna Crutcher, , MD  hydrALAZINE (APRESOLINE) 100 MG tablet Take 1 tablet (100 mg total) by mouth every 8 (eight) hours. 10/13/19  Yes Tedd Cottrill, , MD  ibuprofen (ADVIL) 600 MG tablet Take 1 tablet (600 mg total) by mouth every 6 (six) hours as needed. Patient taking differently: Take 600 mg by mouth every 6 (six) hours as needed for moderate pain.  08/31/19  Yes , NP  isosorbide mononitrate (IMDUR) 60 MG 24 hr tablet Take 1 tablet (60 mg total) by mouth daily. 08/18/19  Yes Alpheus Stiff, , MD  potassium chloride SA (KLOR-CON) 20 MEQ tablet Take 3 tablets (60 mEq total) by mouth daily. 08/18/19  Yes Florie Carico, , MD  sacubitril-valsartan (ENTRESTO) 49-51 MG Take 1 tablet by mouth 2 (two) times daily. 11/10/19  Yes Jia Mohamed, , MD  spironolactone (ALDACTONE) 50 MG tablet Take 1 tablet (50 mg  total) by mouth daily. 11/02/19  Yes Dresean Beckel, Bevelyn Buckles, MD  torsemide (DEMADEX) 20 MG tablet Take 2 tablets (40 mg total) by mouth daily. 08/18/19  Yes Candyce Gambino, Bevelyn Buckles, MD    Past Medical History: Past Medical History:  Diagnosis Date  . CHF (congestive heart failure) (HCC)   . Hypertension   . Obesity   . OSA (obstructive sleep apnea) 11/05/2014   02/2015 -severe -AHI 106/h     Past Surgical History: Past Surgical History:  Procedure Laterality Date  . HERNIA REPAIR    . RIGHT/LEFT HEART CATH AND CORONARY ANGIOGRAPHY N/A 02/07/2018   Procedure: RIGHT/LEFT HEART CATH AND CORONARY ANGIOGRAPHY;  Surgeon: Marykay Lex, MD;  Location: [facility] INVASIVE CV LAB;  Service: Cardiovascular;  Laterality: N/A;    Family History: Family History  Problem Relation Age of Onset  .  Hypertension Mother   . Heart disease Maternal Grandfather   . Hypertension Maternal Grandfather   . Hypertension Sister     Social History: Social History   Socioeconomic History  . Marital status: Single    Spouse name: Not on file  . Number of children: 1  . Years of education: 64  . Highest education level: Not on file  Occupational History  . 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 Smoking status: Never Smoker  . Smokeless tobacco: Never Used  Substance and Sexual Activity  . Alcohol use: No  . Drug use: No  . Sexual activity: Not on file  Other Topics Concern  . Not on file  Social History Narrative   Born and raised in [place], [place]. Currently live in a private residence with his mother. Fun: Play with his daughter.   Denies religious beliefs that would effect health care.    Social Determinants of Health   Financial Resource Strain: Low Risk   . Difficulty of Paying Living Expenses: Not very hard  Food Insecurity: Food Insecurity Present  . Worried About Programme researcher, broadcasting/film/video in the Last Year: Sometimes true  . Ran Out of Food in the Last Year: Sometimes true  Transportation Needs: No Transportation Needs  . Lack of Transportation (Medical): No  . Lack of Transportation (Non-Medical): No  Physical Activity:   . Days of Exercise per Week: Not on file  . Minutes of Exercise per Session: Not on file  Stress:   . Feeling of Stress : Not on file  Social Connections:   . Frequency of Communication with Friends and Family: Not on file  . Frequency of Social Gatherings with Friends and Family: Not on file  . Attends Religious Services: Not on file  . Active Member of Clubs or Organizations: Not on file  . Attends Banker Meetings: Not on file  . Marital Status: Not on file    Allergies:  No Known Allergies  Objective:    Vital Signs:   Temp:  [97.5 F (36.4 C)-97.8 F (36.6 C)] 97.5 F (36.4 C) (01/09  0240) Pulse Rate:  [82-107] 107 (01/09 1002) Resp:  [15-25] 18 (01/09 1002) BP: (90-163)/(52-101) 163/101 (01/09 1002) SpO2:  [96 %-100 %] 98 % (01/09 0240) Weight:  [108.1 kg-108.4 kg] 108.1 kg (01/09 0240)    Weight change: Filed Weights   11/10/19 1341 11/10/19 1951 11/11/19 0240  Weight: 108.4 kg 108.3 kg 108.1 kg    Intake/Output:   Intake/Output Summary (Last 24 hours) at 11/11/2019 1118 Last data filed at 11/10/2019 1836 Gross per  24 hour  Intake 1250 ml  Output --  Net 1250 ml      Physical Exam    General:  Obese male . No resp difficulty HEENT: normal Neck: supple. JVP flat. Carotids 2+ bilat; no bruits. No lymphadenopathy or thyromegaly appreciated. Cor: PMI nondisplaced. Tachy regular No rubs, gallops or murmurs. Lungs: clear Abdomen: obese soft, nontender, nondistended. No hepatosplenomegaly. No bruits or masses. Good bowel sounds. Extremities: no cyanosis, clubbing, rash, edema sroe R elbow. No clear effusion. Limited ROM Neuro: alert & orientedx3, cranial nerves grossly intact. moves all 4 extremities w/o difficulty. Affect pleasant   Telemetry   Sinus tach 100-110 Personally reviewed  EKG    Sinus 83. LVH with repol Personally reviewed   Labs   Basic Metabolic Panel: Recent Labs  Lab 11/10/19 1027 11/10/19 1230 11/10/19 1342 11/10/19 1644 11/10/19 2005 11/11/19 0240  NA 135 133* 135  --   --  134*  K 7.2* 7.1* >7.5* 5.9* 6.2* 5.4*  CL 108 108 107  --   --  107  CO2 16* 16* 17*  --   --  16*  GLUCOSE 108* 131* 102*  --   --  95  BUN 90* 94* 95*  --   --  83*  CREATININE 5.60* 5.97* 6.06*  --  5.44* 4.39*  CALCIUM 9.7 9.5 9.7  --   --  9.3  PHOS  --   --   --   --   --  5.5*    Liver Function Tests: Recent Labs  Lab 11/10/19 1342 11/11/19 0240  AST 18 23  ALT 18 23  ALKPHOS 43 41  BILITOT 0.4 0.5  PROT 8.1 7.9  ALBUMIN 4.3 4.2   No results for input(s): LIPASE, AMYLASE in the last 168 hours. No results for input(s): AMMONIA in  the last 168 hours.  CBC: Recent Labs  Lab 11/10/19 1342 11/10/19 2005 11/11/19 0240  WBC 9.5 12.8* 10.1  HGB 10.9* 11.0* 10.8*  HCT 33.7* 33.6* 32.4*  MCV 82.4 81.0 79.8*  PLT 274 292 271    Cardiac Enzymes: No results for input(s): CKTOTAL, CKMB, CKMBINDEX, TROPONINI in the last 168 hours.  BNP: BNP (last 3 results) Recent Labs    07/15/19 1927 11/10/19 1027 11/10/19 1342  BNP 1,024.4* 9.2 13.4    ProBNP (last 3 results) No results for input(s): PROBNP in the last 8760 hours.   CBG: Recent Labs  Lab 11/10/19 1526  GLUCAP 129*    Coagulation Studies: No results for input(s): LABPROT, INR in the last 72 hours.   Imaging    No results found.   Medications:     Current Medications: . heparin  5,000 Units Subcutaneous Q8H  . sodium zirconium cyclosilicate  10 g Oral Once     Infusions: . sodium chloride 75 mL/hr at 11/10/19 2126        Assessment/Plan   1. AKI on CKD3a - due to volume depletion and NSAIDs - improving with IVFs  - continue to hold diuretics/ARNI and spiro - baseline creatinine 1.5-1.9  2. Chronic systolic HF due to NICM (likely hypertensive) - EF previosuly 15-20% - echo 1/8 EF improved to 45% - holding ARNI, spiro and torsemide - BP up. Can restart carvedilol and cover with PRN hydralazine. Goal SBP 120-140  3. HTN - As above  4. Microcytic anemia - check iron stores  5. Severe hypertriglyceridemia - start Vascepa and statin - very lowfat diet - Cannot use fibrates with CrCL <  30. Can start as renal function improves - If develops signs of hyperviscosity can consider plasmapheresis   6. Hyperkalemia - improving  7. R elbow pain - possible gout. - check uric acid  Length of Stay: 1  Glori Bickers, MD  11/11/2019, 11:18 AM  Advanced Heart Failure Team Pager [phone] (M-F; 7a - 4p)  Please contact [facility] for night-coverage after hours (4p -7a ) and weekends on [email or web address]

## 2019-11-11 NOTE — Progress Notes (Addendum)
[facility] Progress Note   Subjective:   Feels better, requesting d/c home if possible.   Objective Vitals:   11/10/19 1830 11/10/19 1951 11/10/19 1951 11/11/19 0240  BP:   (!) 143/85 (!) 151/90  Pulse: 86  86 89  Resp: 17  20 15   Temp:   97.7 F (36.5 C) (!) 97.5 F (36.4 C)  TempSrc:   Oral Oral  SpO2: 97%  100% 98%  Weight:  108.3 kg  108.1 kg  Height:  5\' 6"  (1.676 m)     Physical Exam General: well appearing obese Heart: RRR  Lungs:clear  Abdomen: soft, obese Extremities: no edema  Additional Objective Labs: Basic Metabolic Panel: Recent Labs  Lab 11/10/19 1230 11/10/19 1342 11/10/19 1644 11/10/19 2005 11/11/19 0240  NA 133* 135  --   --  134*  K 7.1* >7.5* 5.9* 6.2* 5.4*  CL 108 107  --   --  107  CO2 16* 17*  --   --  16*  GLUCOSE 131* 102*  --   --  95  BUN 94* 95*  --   --  83*  CREATININE 5.97* 6.06*  --  5.44* 4.39*  CALCIUM 9.5 9.7  --   --  9.3  PHOS  --   --   --   --  5.5*   Liver Function Tests: Recent Labs  Lab 11/10/19 1342 11/11/19 0240  AST 18 23  ALT 18 23  ALKPHOS 43 41  BILITOT 0.4 0.5  PROT 8.1 7.9  ALBUMIN 4.3 4.2   No results for input(s): LIPASE, AMYLASE in the last 168 hours. CBC: Recent Labs  Lab 11/10/19 1342 11/10/19 2005 11/11/19 0240  WBC 9.5 12.8* 10.1  HGB 10.9* 11.0* 10.8*  HCT 33.7* 33.6* 32.4*  MCV 82.4 81.0 79.8*  PLT 274 292 271   Blood Culture    Component Value Date/Time   SDES THROAT 11/28/2017 1359   SPECREQUEST NONE 11/28/2017 1359   CULT FEW STREPTOCOCCUS,BETA HEMOLYTIC NOT GROUP A 11/28/2017 1359   REPTSTATUS 11/30/2017 FINAL 11/28/2017 1359    Cardiac Enzymes: No results for input(s): CKTOTAL, CKMB, CKMBINDEX, TROPONINI in the last 168 hours. CBG: Recent Labs  Lab 11/10/19 1526  GLUCAP 129*   Iron Studies: No results for input(s): IRON, TIBC, TRANSFERRIN, FERRITIN in the last 72 hours. @lablastinr3 @ Studies/Results: ECHOCARDIOGRAM COMPLETE  Result Date:  11/10/2019   ECHOCARDIOGRAM REPORT   Patient Name:   Steven Chase Date of Exam: 11/10/2019 Medical Rec #:  01/08/2020       Height:       66.0 in Accession #:    [name]      Weight:       238.0 lb Date of Birth:  1986-06-27      BSA:          2.15 m Patient Age:    33 years        BP:           97/58 mmHg Patient Gender: M               HR:           88 bpm. Exam Location:  Outpatient Procedure: 2D Echo, Cardiac Doppler, Color Doppler and Strain Analysis Indications:    I50.40* Unspecified combined systolic (congestive) and diastolic                 (congestive) heart failure  History:        Patient  has prior history of Echocardiogram examinations, most                 recent 07/16/2019. Cardiomyopathy and CHF, Signs/Symptoms:Chest                 Pain; Risk Factors:Hypertension and Sleep Apnea. Edema.  Sonographer:    Sheralyn Boatman Referring Phys: [phone] AMY D CLEGG  Sonographer Comments: Technically difficult study due to poor echo windows and patient is morbidly obese. Image acquisition challenging due to patient body habitus. IMPRESSIONS  1. Left ventricular ejection fraction, by visual estimation, is 40 to 45%. The left ventricle has moderately decreased function. There is severely increased left ventricular hypertrophy.  2. Left ventricular diastolic parameters are consistent with Grade I diastolic dysfunction (impaired relaxation).  3. The left ventricle demonstrates regional wall motion abnormalities.  4. Poor acoustic windows limit study LVEF appears improved compared to report from September 2020.  5. Global right ventricle has moderately reduced systolic function.The right ventricular size is normal. No increase in right ventricular wall thickness.  6. Left atrial size was normal.  7. Right atrial size was normal.  8. The mitral valve is normal in structure. No evidence of mitral valve regurgitation.  9. The tricuspid valve is normal in structure. 10. The aortic valve is tricuspid. Aortic valve  regurgitation is not visualized. Mild aortic valve sclerosis without stenosis. 11. The pulmonic valve was normal in structure. Pulmonic valve regurgitation is not visualized. 12. The inferior vena cava is normal in size with greater than 50% respiratory variability, suggesting right atrial pressure of 3 mmHg. 13. Poor acoustic windows limit study. FINDINGS  Left Ventricle: Left ventricular ejection fraction, by visual estimation, is 40 to 45%. The left ventricle has moderately decreased function. The left ventricle demonstrates regional wall motion abnormalities. The left ventricular internal cavity size was the left ventricle is normal in size. There is severely increased left ventricular hypertrophy. Left ventricular diastolic parameters are consistent with Grade I diastolic dysfunction (impaired relaxation). Normal left atrial pressure. Poor acoustic windows limit study LVEF appears improved compared to report from September 2020. Right Ventricle: The right ventricular size is normal. No increase in right ventricular wall thickness. Global RV systolic function is has moderately reduced systolic function. Left Atrium: Left atrial size was normal in size. Right Atrium: Right atrial size was normal in size Pericardium: There is no evidence of pericardial effusion. Mitral Valve: The mitral valve is normal in structure. No evidence of mitral valve regurgitation. Tricuspid Valve: The tricuspid valve is normal in structure. Tricuspid valve regurgitation is trivial. Aortic Valve: The aortic valve is tricuspid. Aortic valve regurgitation is not visualized. Mild aortic valve sclerosis is present, with no evidence of aortic valve stenosis. Pulmonic Valve: The pulmonic valve was normal in structure. Pulmonic valve regurgitation is not visualized. Pulmonic regurgitation is not visualized. Aorta: The aortic root is normal in size and structure. Venous: The inferior vena cava is normal in size with greater than 50% respiratory  variability, suggesting right atrial pressure of 3 mmHg. IAS/Shunts: No atrial level shunt detected by color flow Doppler. Additional Comments: Poor acoustic windows limit study.  LEFT VENTRICLE PLAX 2D LVIDd:         4.15 cm       Diastology LVIDs:         3.45 cm       LV e' lateral:   4.28 cm/s LV PW:         1.80 cm  LV E/e' lateral: 12.9 LV IVS:        1.90 cm       LV e' medial:    5.10 cm/s LVOT diam:     2.30 cm       LV E/e' medial:  10.8 LV SV:         27 ml LV SV Index:   11.89 LVOT Area:     4.15 cm  LV Volumes (MOD) LV area d, A2C:    32.40 cm LV area d, A4C:    44.20 cm LV area s, A2C:    23.80 cm LV area s, A4C:    34.40 cm LV major d, A2C:   7.00 cm LV major d, A4C:   9.03 cm LV major s, A2C:   6.45 cm LV major s, A4C:   8.07 cm LV vol d, MOD A2C: 125.0 ml LV vol d, MOD A4C: 178.0 ml LV vol s, MOD A2C: 73.8 ml LV vol s, MOD A4C: 121.0 ml LV SV MOD A2C:     51.2 ml LV SV MOD A4C:     178.0 ml LV SV MOD BP:      63.7 ml RIGHT VENTRICLE             IVC RV S prime:     12.30 cm/s  IVC diam: 1.00 cm TAPSE (M-mode): 2.2 cm LEFT ATRIUM             Index       RIGHT ATRIUM           Index LA diam:        3.80 cm 1.76 cm/m  RA Area:     10.30 cm LA Vol (A2C):   57.1 ml 26.52 ml/m RA Volume:   17.90 ml  8.31 ml/m LA Vol (A4C):   37.2 ml 17.28 ml/m LA Biplane Vol: 49.7 ml 23.08 ml/m  AORTIC VALVE LVOT Vmax:   76.60 cm/s LVOT Vmean:  48.200 cm/s LVOT VTI:    0.138 m  AORTA Ao Root diam: 3.30 cm Ao Asc diam:  2.80 cm MITRAL VALVE MV Area (PHT): 5.13 cm             SHUNTS MV PHT:        42.92 msec           Systemic VTI:  0.14 m MV Decel Time: 148 msec             Systemic Diam: 2.30 cm MV E velocity: 55.30 cm/s 103 cm/s MV A velocity: 61.80 cm/s 70.3 cm/s MV E/A ratio:  0.89       1.5  Dorris Carnes MD Electronically signed by Dorris Carnes MD Signature Date/Time: 11/10/2019/4:32:25 PM    Final    Medications: . sodium chloride 75 mL/hr at 11/10/19 2126   . heparin  5,000 Units Subcutaneous Q8H     Assessment/Plan: **AKI on CKD 3: severe AKI with Cr 6, from baseline of 1.5-1.8.  I suspect this is multifactorial with factors including modest hypotension, entresto, spironolactone, recent heavy NSAID use.  Improved tremendously already with holding diuretics, fluids, entresto.  No further IV fluids today - appears euvolemic so would give it another day before reintroducing diuretics.  Hopefully will recover to baseline.  **Hyperkalemia, severe: resolved  **HTN: BP up - cardiology reintroducing meds  **HFrEF: recent EF 45-50% which is much improved. Care per cardiology.  I don't think this admission represents a contraindication to future entresto use but  would use lower dose with close follow up of K/Cr.  Diuretics per HF team.  **Anemia:  Hb was 16-17s during 07/2019 admission, noted to be 10.9 (WBC and plt ok).  Iron studies pending  **OSA: CPAP qhs  **Obesity:  Discussed healthy weight loss; hes interested in bariatric surgery.   Estill Bakes MD 11/11/2019, 7:26 AM  [facility]

## 2019-11-11 NOTE — Progress Notes (Signed)
PROGRESS NOTE  Steven Chase:[id number] DOB: May 09, 1986 DOA: 11/10/2019 PCP: Corwin Levins, MD  Brief summary:  Patient was seen the heart failure clinic where he was noted to have hyperkalemia, AKI, he is referred to the ED  In the ED, patient had a normal-looking EKG but received temporizing measures with calcium gluconate dextrose 50% 50 mL insulin IV and received Lokelma and Kayexalate for severe hyperkalemia.  Nephrology was consulted from the ED.  Patient also received 250 mL IV fluid bolus followed by 75 mL/h.   HPI/Recap of past 24 hours:  Reports woke up this morning with a headache, he did not use cpap last night He is on IV hydration at 75cc /hr He reports EMS checks on him daily at home, his systolic blood pressure has been in the 90's last week, his blood pressure is elevated this morning He reports used two dose of ibuprofen for knee pain last week He reports right elbow pain  Assessment/Plan: Principal Problem:   Hyperkalemia Active Problems:   Essential hypertension   OSA (obstructive sleep apnea)   Obesity   CKD (chronic kidney disease), stage II   Chronic systolic heart failure (HCC)   AKI (acute kidney injury) (HCC)    AKI on CKDII / severe hyperkalemia -Likely due to dehydration, hypotension, he denies heavy use of ibuprofen (report only used 2 doses last week) -K greater than 7.5 on presentation , no acute EKG changes, received insulin dextrose IV calcium, Kayexalate in the ED for hyperkalemia, K 5.0 this morning, keep on tele today -Improving on hydration , continue hold Entresto, spironolactone, Demadex -Appreciate nephrology and cardiology input  Chronic systolic heart failure due to nonischemic cardiomyopathy -EF previosuly 15-20%, - echo 1/8 EF improved to 45% -hold Entresto, spironolactone, Demadex due to AKI and severe hyperkalemia -No edema, lung clear on exam -cardiology Dr.Bensimhon's input appreciated, will follow  recommendation  Anemia -hgb 10.8 -No sign of blood loss -check iron, retic, FOBT  HTN -On Coreg, hydralazine, Imdur, Entresto, spironolactone, Demadex at home, all has been held on admission due to borderline blood pressure (90/52) on presentation -He reports blood pressure has been running low at home for the last week -Blood pressure is elevated this morning -Resume Coreg for now  Obesity/OSA continue nightly CPAP Body mass index is 38.48 kg/m.   Dyslipidemia -Lipid panel does not appear to be fasting -will get fasting lipid panel in the morning  Left knee pain -Left knee x-ray obtained on January 6 showed "small suprapatellar joint effusion. No acute bony abnormality" -Denies knee pain this morning, knee joint exam benign, nontender, not warm to touch, no edema, range of motion intact  Right elbow pain -Has slight point tenderness superior lateral right  Elbow, no edema, not warm to touch, range of motion intact  DVT Prophylaxis: Subcu heparin  Code Status: Full  Family Communication: patient   Disposition Plan: Pending potassium, creatinine improvement, needs nephrology and cardiology clearance   Consultants:  Nephrology  Cardiology  Procedures:  None  Antibiotics:  None   Objective: BP (!) 163/101 (BP Location: Left Arm)   Pulse (!) 107   Temp (!) 97.5 F (36.4 C) (Oral)   Resp 18   Ht 5\' 6"  (1.676 m)   Wt 108.1 kg   SpO2 98%   BMI 38.48 kg/m   Intake/Output Summary (Last 24 hours) at 11/11/2019 1034 Last data filed at 11/10/2019 1836 Gross per 24 hour  Intake 1250 ml  Output --  Net 1250  ml   Filed Weights   11/10/19 1341 11/10/19 1951 11/11/19 0240  Weight: 108.4 kg 108.3 kg 108.1 kg    Exam: Patient is examined daily including today on 11/11/2019, exams remain the same as of yesterday except that has changed    General:  NAD  Cardiovascular: RRR  Respiratory: CTABL  Abdomen: Soft/ND/NT, positive BS  Musculoskeletal: No  Edema  Neuro: alert, oriented   Data Reviewed: Basic Metabolic Panel: Recent Labs  Lab 11/10/19 1027 11/10/19 1230 11/10/19 1342 11/10/19 1644 11/10/19 2005 11/11/19 0240  NA 135 133* 135  --   --  134*  K 7.2* 7.1* >7.5* 5.9* 6.2* 5.4*  CL 108 108 107  --   --  107  CO2 16* 16* 17*  --   --  16*  GLUCOSE 108* 131* 102*  --   --  95  BUN 90* 94* 95*  --   --  83*  CREATININE 5.60* 5.97* 6.06*  --  5.44* 4.39*  CALCIUM 9.7 9.5 9.7  --   --  9.3  PHOS  --   --   --   --   --  5.5*   Liver Function Tests: Recent Labs  Lab 11/10/19 1342 11/11/19 0240  AST 18 23  ALT 18 23  ALKPHOS 43 41  BILITOT 0.4 0.5  PROT 8.1 7.9  ALBUMIN 4.3 4.2   No results for input(s): LIPASE, AMYLASE in the last 168 hours. No results for input(s): AMMONIA in the last 168 hours. CBC: Recent Labs  Lab 11/10/19 1342 11/10/19 2005 11/11/19 0240  WBC 9.5 12.8* 10.1  HGB 10.9* 11.0* 10.8*  HCT 33.7* 33.6* 32.4*  MCV 82.4 81.0 79.8*  PLT 274 292 271   Cardiac Enzymes:   No results for input(s): CKTOTAL, CKMB, CKMBINDEX, TROPONINI in the last 168 hours. BNP (last 3 results) Recent Labs    07/15/19 1927 11/10/19 1027 11/10/19 1342  BNP 1,024.4* 9.2 13.4    ProBNP (last 3 results) No results for input(s): PROBNP in the last 8760 hours.  CBG: Recent Labs  Lab 11/10/19 1526  GLUCAP 129*    Recent Results (from the past 240 hour(s))  SARS CORONAVIRUS 2 (TAT 6-24 HRS) Nasopharyngeal Nasopharyngeal Swab     Status: None   Collection Time: 11/10/19  3:53 PM   Specimen: Nasopharyngeal Swab  Result Value Ref Range Status   SARS Coronavirus 2 NEGATIVE NEGATIVE Final    Comment: (NOTE) SARS-CoV-2 target nucleic acids are NOT DETECTED. The SARS-CoV-2 RNA is generally detectable in upper and lower respiratory specimens during the acute phase of infection. Negative results do not preclude SARS-CoV-2 infection, do not rule out co-infections with other pathogens, and should not be used as  the sole basis for treatment or other patient management decisions. Negative results must be combined with clinical observations, patient history, and epidemiological information. The expected result is Negative. Fact Sheet for Patients: SugarRoll.be Fact Sheet for Healthcare Providers: https://www.woods-mathews.com/ This test is not yet approved or cleared by the [place] FDA and  has been authorized for detection and/or diagnosis of SARS-CoV-2 by FDA under an Emergency Use Authorization (EUA). This EUA will remain  in effect (meaning this test can be used) for the duration of the COVID-19 declaration under Section 56 4(b)(1) of the Act, 21 U.S.C. section 360bbb-3(b)(1), unless the authorization is terminated or revoked sooner. Performed at [facility], [place] [address], [place], [place] [address]      Studies: No results  found.  Scheduled Meds: . heparin  5,000 Units Subcutaneous Q8H  . sodium zirconium cyclosilicate  10 g Oral Once    Continuous Infusions: . sodium chloride 75 mL/hr at 11/10/19 2126     Time spent: I have personally reviewed and interpreted on  11/11/2019 daily labs, tele strips, imagings as discussed above under date review session and assessment and plans.  I reviewed all nursing notes, pharmacy notes, consultant notes,  vitals, pertinent old records  I have discussed plan of care as described above with RN , patient  on 11/11/2019   Albertine Grates MD, PhD, FACP  [facility] Pager [phone]. If 7PM-7AM, please contact night-coverage at [email or web address], password [facility] 11/11/2019, 10:34 AM  LOS: 1 day

## 2019-11-11 NOTE — Progress Notes (Signed)
RT offered pt CPAP dream station for the night and pt declined stating he did not want to wear at this time. RT expressed to pt to call if he decided he wanted it. RT will continue to monitor.

## 2019-11-11 NOTE — Progress Notes (Signed)
Patient complaining of 7/10 sharp pain to right hand radiating towards elbow.  Per patient difficulty making a fist due to pain.  Tylenol given earlier with little relief per patient, no other PRN ordered for pain.  [facility] p[facility].

## 2019-11-12 LAB — FERRITIN: Ferritin: 377 ng/mL — ABNORMAL HIGH (ref 24–336)

## 2019-11-12 LAB — CBC WITH DIFFERENTIAL/PLATELET
Abs Immature Granulocytes: 0.05 10*3/uL (ref 0.00–0.07)
Basophils Absolute: 0 10*3/uL (ref 0.0–0.1)
Basophils Relative: 0 %
Eosinophils Absolute: 0.1 10*3/uL (ref 0.0–0.5)
Eosinophils Relative: 1 %
HCT: 31.9 % — ABNORMAL LOW (ref 39.0–52.0)
Hemoglobin: 10.8 g/dL — ABNORMAL LOW (ref 13.0–17.0)
Immature Granulocytes: 1 %
Lymphocytes Relative: 19 %
Lymphs Abs: 2.1 10*3/uL (ref 0.7–4.0)
MCH: 26.8 pg (ref 26.0–34.0)
MCHC: 33.9 g/dL (ref 30.0–36.0)
MCV: 79.2 fL — ABNORMAL LOW (ref 80.0–100.0)
Monocytes Absolute: 1.2 10*3/uL — ABNORMAL HIGH (ref 0.1–1.0)
Monocytes Relative: 11 %
Neutro Abs: 7.5 10*3/uL (ref 1.7–7.7)
Neutrophils Relative %: 68 %
Platelets: 291 10*3/uL (ref 150–400)
RBC: 4.03 MIL/uL — ABNORMAL LOW (ref 4.22–5.81)
RDW: 19.4 % — ABNORMAL HIGH (ref 11.5–15.5)
WBC: 11 10*3/uL — ABNORMAL HIGH (ref 4.0–10.5)
nRBC: 0 % (ref 0.0–0.2)

## 2019-11-12 LAB — URIC ACID: Uric Acid, Serum: 14 mg/dL — ABNORMAL HIGH (ref 3.7–8.6)

## 2019-11-12 LAB — BASIC METABOLIC PANEL
Anion gap: 12 (ref 5–15)
BUN: 57 mg/dL — ABNORMAL HIGH (ref 6–20)
CO2: 17 mmol/L — ABNORMAL LOW (ref 22–32)
Calcium: 9.2 mg/dL (ref 8.9–10.3)
Chloride: 107 mmol/L (ref 98–111)
Creatinine, Ser: 2.71 mg/dL — ABNORMAL HIGH (ref 0.61–1.24)
GFR calc Af Amer: 34 mL/min — ABNORMAL LOW (ref >60)
GFR calc non Af Amer: 29 mL/min — ABNORMAL LOW (ref >60)
Glucose, Bld: 122 mg/dL — ABNORMAL HIGH (ref 70–99)
Potassium: 4.4 mmol/L (ref 3.5–5.1)
Sodium: 136 mmol/L (ref 135–145)

## 2019-11-12 LAB — IRON AND TIBC
Iron: 71 ug/dL (ref 45–182)
Saturation Ratios: 17 % — ABNORMAL LOW (ref 17.9–39.5)
TIBC: 412 ug/dL (ref 250–450)
UIBC: 341 ug/dL

## 2019-11-12 LAB — LIPID PANEL
Cholesterol: 309 mg/dL — ABNORMAL HIGH (ref 0–200)
LDL Cholesterol: UNDETERMINED mg/dL (ref 0–99)
Triglycerides: 1378 mg/dL — ABNORMAL HIGH (ref <150)
VLDL: UNDETERMINED mg/dL (ref 0–40)

## 2019-11-12 LAB — FOLATE: Folate: 10.9 ng/mL (ref >5.9)

## 2019-11-12 LAB — RETICULOCYTES
Immature Retic Fract: 11.5 % (ref 2.3–15.9)
RBC.: 4 MIL/uL — ABNORMAL LOW (ref 4.22–5.81)
Retic Count, Absolute: 65.2 10*3/uL (ref 19.0–186.0)
Retic Ct Pct: 1.6 % (ref 0.4–3.1)

## 2019-11-12 LAB — LDL CHOLESTEROL, DIRECT: Direct LDL: 77.6 mg/dL (ref 0–99)

## 2019-11-12 LAB — VITAMIN B12: Vitamin B-12: 323 pg/mL (ref 180–914)

## 2019-11-12 MED ORDER — SENNOSIDES-DOCUSATE SODIUM 8.6-50 MG PO TABS
1.0000 | ORAL_TABLET | Freq: Two times a day (BID) | ORAL | Status: DC
  Administered 2019-11-12: 1 via ORAL
  Filled 2019-11-12 (×2): qty 1

## 2019-11-12 MED ORDER — SODIUM CHLORIDE 0.9 % IV SOLN: INTRAVENOUS | Status: DC

## 2019-11-12 MED ORDER — COLCHICINE 0.6 MG PO TABS
0.6000 mg | ORAL_TABLET | Freq: Every day | ORAL | Status: DC
  Administered 2019-11-12 – 2019-11-13 (×2): 0.6 mg via ORAL
  Filled 2019-11-12 (×2): qty 1

## 2019-11-12 MED ORDER — ISOSORBIDE MONONITRATE ER 60 MG PO TB24: 60.0000 mg | ORAL_TABLET | Freq: Every day | ORAL | Status: DC

## 2019-11-12 MED ORDER — HYDRALAZINE HCL 25 MG PO TABS
25.0000 mg | ORAL_TABLET | Freq: Three times a day (TID) | ORAL | Status: DC
  Administered 2019-11-12: 25 mg via ORAL
  Filled 2019-11-12: qty 1

## 2019-11-12 MED ORDER — FENOFIBRATE 160 MG PO TABS
160.0000 mg | ORAL_TABLET | Freq: Every day | ORAL | Status: DC
  Administered 2019-11-12 – 2019-11-13 (×2): 160 mg via ORAL
  Filled 2019-11-12 (×2): qty 1

## 2019-11-12 MED ORDER — PREDNISONE 10 MG PO TABS: 10.0000 mg | ORAL_TABLET | Freq: Every day | ORAL | Status: DC

## 2019-11-12 MED ORDER — CARVEDILOL 12.5 MG PO TABS
12.5000 mg | ORAL_TABLET | Freq: Two times a day (BID) | ORAL | Status: DC
  Administered 2019-11-12 – 2019-11-13 (×3): 12.5 mg via ORAL
  Filled 2019-11-12 (×3): qty 1

## 2019-11-12 MED ORDER — ISOSORBIDE MONONITRATE ER 30 MG PO TB24
30.0000 mg | ORAL_TABLET | Freq: Every day | ORAL | Status: DC
  Administered 2019-11-12: 30 mg via ORAL
  Filled 2019-11-12: qty 1

## 2019-11-12 MED ORDER — HYDRALAZINE HCL 50 MG PO TABS
50.0000 mg | ORAL_TABLET | Freq: Three times a day (TID) | ORAL | Status: DC
  Administered 2019-11-12 – 2019-11-13 (×3): 50 mg via ORAL
  Filled 2019-11-12 (×3): qty 1

## 2019-11-12 MED ORDER — PREDNISONE 20 MG PO TABS
20.0000 mg | ORAL_TABLET | Freq: Every day | ORAL | Status: DC
  Administered 2019-11-13: 20 mg via ORAL
  Filled 2019-11-12: qty 1

## 2019-11-12 MED ORDER — CARVEDILOL 6.25 MG PO TABS
6.2500 mg | ORAL_TABLET | Freq: Once | ORAL | Status: AC
  Administered 2019-11-12: 6.25 mg via ORAL
  Filled 2019-11-12: qty 1

## 2019-11-12 NOTE — Progress Notes (Signed)
Patient refuses CPAP will call if changes mind

## 2019-11-12 NOTE — Progress Notes (Addendum)
PROGRESS NOTE  Steven Chase ZOX:[id number] DOB: Mar 25, 1986 DOA: 11/10/2019 PCP: Corwin Levins, MD  Brief summary:  Patient was seen the heart failure clinic where he was noted to have hyperkalemia, AKI, he is referred to the ED  In the ED, patient had a normal-looking EKG but received temporizing measures with calcium gluconate dextrose 50% 50 mL insulin IV and received Lokelma and Kayexalate for severe hyperkalemia.  Nephrology was consulted from the ED.  Patient also received 250 mL IV fluid bolus followed by 75 mL/h.   HPI/Recap of past 24 hours:   He refused cpap last night, bp elevated this am, cr continue to trend down, k now normal Continue c/o right elbow pain    Assessment/Plan: Principal Problem:   Hyperkalemia Active Problems:   Essential hypertension   OSA (obstructive sleep apnea)   Obesity   CKD (chronic kidney disease), stage II   Chronic systolic heart failure (HCC)   AKI (acute kidney injury) (HCC)    AKI on CKDII / severe hyperkalemia -Likely due to dehydration, hypotension, he denies heavy use of ibuprofen (report only used 2 doses last week) -K greater than 7.5 on presentation , no acute EKG changes, received insulin dextrose IV calcium, Kayexalate in the ED for hyperkalemia, K 5.0 this morning,  -Improving on hydration , continue hold Entresto, spironolactone, Demadex -K normalized, creatinine trending down, oral intake has improved, will d/c ivf,  discontinue telemetry today -Repeat BMP in the morning -Appreciate nephrology and cardiology input  Chronic systolic heart failure due to nonischemic cardiomyopathy -EF previosuly 15-20%, - echo 1/8 EF improved to 45% -hold Entresto, spironolactone, Demadex due to AKI and severe hyperkalemia -No edema, lung clear on exam -cardiology Dr.Bensimhon's input appreciated, will follow recommendation  Anemia, likely component of anemia of chronic disease with with inappropriate reticulocyte response -hgb  10.8 -No sign of blood loss - FOBT pending collection  HTN -On Coreg, hydralazine, Imdur, Entresto, spironolactone, Demadex at home,  -all has been held on admission due to borderline blood pressure (90/52) on presentation, -He reports blood pressure has been running low at home for the last week -sbp in 170-180's this am, will increase Coreg to 12.5 twice daily, resume hydralazine and Imdur at lower dose, encourage CPAP compliance -Monitor blood pressure, continue titrate blood pressure medication  Obesity/OSA continue nightly CPAP He refused CPAP in the hospital, not sure CPAP compliance at home Body mass index is 38.12 kg/m.   Dyslipidemia/severe hypertriglyceridemia -Dr Gala Romney recommendation appreciated - per Dr Gala Romney on 1/9  " start Vascepa and statin - very lowfat diet - Cannot use fibrates with CrCL < 30. Can start as renal function improves - If develops signs of hyperviscosity can consider plasmapheresis " -He is started on Crestor 40 mg daily, Vascepa 2 g twice a day since 1/9, likely able to start fibrate tomorrow if creatinine  clearance continues to improve  Left knee pain -Left knee x-ray obtained on January 6 showed "small suprapatellar joint effusion. No acute bony abnormality" -Denies knee pain this morning, knee joint exam benign, nontender, not warm to touch, no edema, range of motion intact  Right elbow pain -Has slight point tenderness inferior lateral right  Elbow, no edema, not warm to touch, range of motion intact -He does have elevated uric acid, due to renal impairment, not able to use allopurinol or colchicine, will start prednisone for now for possible gout flares.  DVT Prophylaxis: Subcu heparin  Code Status: Full  Family Communication: patient  Disposition Plan: Pending  creatinine improvement,  bp control,  needs nephrology and cardiology  clearance   Consultants:  Nephrology  Cardiology  Procedures:  None  Antibiotics:  None   Objective: BP (!) 182/110   Pulse (!) 107   Temp 98.6 F (37 C) (Oral)   Resp (!) 21   Ht 5\' 6"  (1.676 m)   Wt 107.1 kg   SpO2 99%   BMI 38.12 kg/m   Intake/Output Summary (Last 24 hours) at 11/12/2019 0810 Last data filed at 11/12/2019 0540 Gross per 24 hour  Intake 1250 ml  Output 775 ml  Net 475 ml   Filed Weights   11/10/19 1951 11/11/19 0240 11/12/19 0500  Weight: 108.3 kg 108.1 kg 107.1 kg    Exam: Patient is examined daily including today on 11/12/2019, exams remain the same as of yesterday except that has changed    General:  NAD  Cardiovascular: RRR  Respiratory: CTABL  Abdomen: Soft/ND/NT, positive BS  Musculoskeletal: No Edema  Neuro: alert, oriented   Data Reviewed: Basic Metabolic Panel: Recent Labs  Lab 11/10/19 1230 11/10/19 1342 11/10/19 1644 11/10/19 2005 11/11/19 0240 11/11/19 1102 11/12/19 0100  NA 133* 135  --   --  134* 133* 136  K 7.1* >7.5* 5.9* 6.2* 5.4* 5.1  5.0 4.4  CL 108 107  --   --  107 108 107  CO2 16* 17*  --   --  16* 19* 17*  GLUCOSE 131* 102*  --   --  95 102* 122*  BUN 94* 95*  --   --  83* 70* 57*  CREATININE 5.97* 6.06*  --  5.44* 4.39* 3.53* 2.71*  CALCIUM 9.5 9.7  --   --  9.3 9.1 9.2  PHOS  --   --   --   --  5.5*  --   --    Liver Function Tests: Recent Labs  Lab 11/10/19 1342 11/11/19 0240  AST 18 23  ALT 18 23  ALKPHOS 43 41  BILITOT 0.4 0.5  PROT 8.1 7.9  ALBUMIN 4.3 4.2   No results for input(s): LIPASE, AMYLASE in the last 168 hours. No results for input(s): AMMONIA in the last 168 hours. CBC: Recent Labs  Lab 11/10/19 1342 11/10/19 2005 11/11/19 0240 11/12/19 0100  WBC 9.5 12.8* 10.1 11.0*  NEUTROABS  --   --   --  7.5  HGB 10.9* 11.0* 10.8* 10.8*  HCT 33.7* 33.6* 32.4* 31.9*  MCV 82.4 81.0 79.8* 79.2*  PLT 274 292 271 291   Cardiac Enzymes:   No results for input(s): CKTOTAL,  CKMB, CKMBINDEX, TROPONINI in the last 168 hours. BNP (last 3 results) Recent Labs    07/15/19 1927 11/10/19 1027 11/10/19 1342  BNP 1,024.4* 9.2 13.4    ProBNP (last 3 results) No results for input(s): PROBNP in the last 8760 hours.  CBG: Recent Labs  Lab 11/10/19 1526  GLUCAP 129*    Recent Results (from the past 240 hour(s))  SARS CORONAVIRUS 2 (TAT 6-24 HRS) Nasopharyngeal Nasopharyngeal Swab     Status: None   Collection Time: 11/10/19  3:53 PM   Specimen: Nasopharyngeal Swab  Result Value Ref Range Status   SARS Coronavirus 2 NEGATIVE NEGATIVE Final    Comment: (NOTE) SARS-CoV-2 target nucleic acids are NOT DETECTED. The SARS-CoV-2 RNA is generally detectable in upper and lower respiratory specimens during the acute phase of infection. Negative results do not preclude SARS-CoV-2 infection, do not rule out co-infections  with other pathogens, and should not be used as the sole basis for treatment or other patient management decisions. Negative results must be combined with clinical observations, patient history, and epidemiological information. The expected result is Negative. Fact Sheet for Patients: SugarRoll.be Fact Sheet for Healthcare Providers: https://www.woods-mathews.com/ This test is not yet approved or cleared by the [place] FDA and  has been authorized for detection and/or diagnosis of SARS-CoV-2 by FDA under an Emergency Use Authorization (EUA). This EUA will remain  in effect (meaning this test can be used) for the duration of the COVID-19 declaration under Section 56 4(b)(1) of the Act, 21 U.S.C. section 360bbb-3(b)(1), unless the authorization is terminated or revoked sooner. Performed at [facility], [place] [address], [place], [place] [address]      Studies: No results found.  Scheduled Meds: . carvedilol  12.5 mg Oral BID WC  . heparin  5,000 Units Subcutaneous Q8H  . icosapent Ethyl  2  g Oral BID  . isosorbide mononitrate  30 mg Oral Daily  . rosuvastatin  40 mg Oral q1800    Continuous Infusions: . sodium chloride 75 mL/hr at 11/10/19 2126     Time spent: 93mins I have personally reviewed and interpreted on  11/12/2019 daily labs, tele strips, imagings as discussed above under date review session and assessment and plans.  I reviewed all nursing notes, pharmacy notes, consultant notes,  vitals, pertinent old records  I have discussed plan of care as described above with RN , patient  on 11/12/2019   Florencia Reasons MD, PhD, FACP  [facility] Pager [phone]. If 7PM-7AM, please contact night-coverage at [email or web address], password [facility] 11/12/2019, 8:10 AM  LOS: 2 days

## 2019-11-12 NOTE — Progress Notes (Signed)
Progress Note  Patient Name: Steven Chase Date of Encounter: 11/12/2019  Primary Cardiologist: Kristeen Miss, MD   Subjective   C/o right elbow pain. No chest pain or sob.   Inpatient Medications    Scheduled Meds: . carvedilol  12.5 mg Oral BID WC  . heparin  5,000 Units Subcutaneous Q8H  . hydrALAZINE  25 mg Oral Q8H  . icosapent Ethyl  2 g Oral BID  . isosorbide mononitrate  30 mg Oral Daily  . rosuvastatin  40 mg Oral q1800   Continuous Infusions: . sodium chloride 75 mL/hr at 11/10/19 2126   PRN Meds: acetaminophen, hydrALAZINE, HYDROcodone-acetaminophen, ondansetron **OR** ondansetron (ZOFRAN) IV   Vital Signs    Vitals:   11/12/19 0500 11/12/19 0534 11/12/19 0705 11/12/19 0841  BP: (!) 170/110 (!) 184/119 (!) 182/110 (!) 158/98  Pulse: (!) 105 (!) 107    Resp:      Temp: 98.6 F (37 C)     TempSrc: Oral     SpO2: 99%     Weight: 107.1 kg     Height:        Intake/Output Summary (Last 24 hours) at 11/12/2019 0931 Last data filed at 11/12/2019 0540 Gross per 24 hour  Intake 1250 ml  Output 775 ml  Net 475 ml   Filed Weights   11/10/19 1951 11/11/19 0240 11/12/19 0500  Weight: 108.3 kg 108.1 kg 107.1 kg    Telemetry    nsr - Personally Reviewed  ECG    nsr - Personally Reviewed  Physical Exam   GEN: No acute distress.   Neck: No JVD Cardiac: RRR, no murmurs, rubs, or gallops.  Respiratory: Clear to auscultation bilaterally. GI: Soft, nontender, non-distended  MS: No edema; No deformity. Right elbow is wrapped and tender to palpate Neuro:  Nonfocal  Psych: Normal affect   Labs    Chemistry Recent Labs  Lab 11/10/19 1342 11/10/19 1644 11/11/19 0240 11/11/19 1102 11/12/19 0100  NA 135  --  134* 133* 136  K >7.5*  --  5.4* 5.1  5.0 4.4  CL 107  --  107 108 107  CO2 17*  --  16* 19* 17*  GLUCOSE 102*  --  95 102* 122*  BUN 95*  --  83* 70* 57*  CREATININE 6.06*   < > 4.39* 3.53* 2.71*  CALCIUM 9.7  --  9.3 9.1 9.2  PROT  8.1  --  7.9  --   --   ALBUMIN 4.3  --  4.2  --   --   AST 18  --  23  --   --   ALT 18  --  23  --   --   ALKPHOS 43  --  41  --   --   BILITOT 0.4  --  0.5  --   --   GFRNONAA 11*   < > 16* 21* 29*  GFRAA 13*   < > 19* 25* 34*  ANIONGAP 11  --  11 6 12    < > = values in this interval not displayed.     Hematology Recent Labs  Lab 11/10/19 2005 11/11/19 0240 11/12/19 0100  WBC 12.8* 10.1 11.0*  RBC 4.15* 4.06* 4.03*  4.00*  HGB 11.0* 10.8* 10.8*  HCT 33.6* 32.4* 31.9*  MCV 81.0 79.8* 79.2*  MCH 26.5 26.6 26.8  MCHC 32.7 33.3 33.9  RDW 20.8* 20.4* 19.4*  PLT 292 271 291    Cardiac EnzymesNo results  for input(s): TROPONINI in the last 168 hours. No results for input(s): TROPIPOC in the last 168 hours.   BNP Recent Labs  Lab 11/10/19 1027 11/10/19 1342  BNP 9.2 13.4     DDimer No results for input(s): DDIMER in the last 168 hours.   Radiology    ECHOCARDIOGRAM COMPLETE  Result Date: 11/10/2019   ECHOCARDIOGRAM REPORT   Patient Name:   Steven Chase Date of Exam: 11/10/2019 Medical Rec #:  [id number]       Height:       66.0 in Accession #:    [id number]      Weight:       238.0 lb Date of Birth:  [birth year]      BSA:          2.15 m Patient Age:    34 years        BP:           97/58 mmHg Patient Gender: M               HR:           88 bpm. Exam Location:  Outpatient Procedure: 2D Echo, Cardiac Doppler, Color Doppler and Strain Analysis Indications:    I50.40* Unspecified combined systolic (congestive) and diastolic                 (congestive) heart failure  History:        Patient has prior history of Echocardiogram examinations, most                 recent 07/16/2019. Cardiomyopathy and CHF, Signs/Symptoms:Chest                 Pain; Risk Factors:Hypertension and Sleep Apnea. Edema.  Sonographer:    Roseanna Rainbow Referring Phys: [phone] AMY D CLEGG  Sonographer Comments: Technically difficult study due to poor echo windows and patient is morbidly obese. Image acquisition  challenging due to patient body habitus. IMPRESSIONS  1. Left ventricular ejection fraction, by visual estimation, is 40 to 45%. The left ventricle has moderately decreased function. There is severely increased left ventricular hypertrophy.  2. Left ventricular diastolic parameters are consistent with Grade I diastolic dysfunction (impaired relaxation).  3. The left ventricle demonstrates regional wall motion abnormalities.  4. Poor acoustic windows limit study LVEF appears improved compared to report from September 2020.  5. Global right ventricle has moderately reduced systolic function.The right ventricular size is normal. No increase in right ventricular wall thickness.  6. Left atrial size was normal.  7. Right atrial size was normal.  8. The mitral valve is normal in structure. No evidence of mitral valve regurgitation.  9. The tricuspid valve is normal in structure. 10. The aortic valve is tricuspid. Aortic valve regurgitation is not visualized. Mild aortic valve sclerosis without stenosis. 11. The pulmonic valve was normal in structure. Pulmonic valve regurgitation is not visualized. 12. The inferior vena cava is normal in size with greater than 50% respiratory variability, suggesting right atrial pressure of 3 mmHg. 13. Poor acoustic windows limit study. FINDINGS  Left Ventricle: Left ventricular ejection fraction, by visual estimation, is 40 to 45%. The left ventricle has moderately decreased function. The left ventricle demonstrates regional wall motion abnormalities. The left ventricular internal cavity size was the left ventricle is normal in size. There is severely increased left ventricular hypertrophy. Left ventricular diastolic parameters are consistent with Grade I diastolic dysfunction (impaired relaxation). Normal left atrial pressure. Poor acoustic windows  limit study LVEF appears improved compared to report from September 2020. Right Ventricle: The right ventricular size is normal. No increase  in right ventricular wall thickness. Global RV systolic function is has moderately reduced systolic function. Left Atrium: Left atrial size was normal in size. Right Atrium: Right atrial size was normal in size Pericardium: There is no evidence of pericardial effusion. Mitral Valve: The mitral valve is normal in structure. No evidence of mitral valve regurgitation. Tricuspid Valve: The tricuspid valve is normal in structure. Tricuspid valve regurgitation is trivial. Aortic Valve: The aortic valve is tricuspid. Aortic valve regurgitation is not visualized. Mild aortic valve sclerosis is present, with no evidence of aortic valve stenosis. Pulmonic Valve: The pulmonic valve was normal in structure. Pulmonic valve regurgitation is not visualized. Pulmonic regurgitation is not visualized. Aorta: The aortic root is normal in size and structure. Venous: The inferior vena cava is normal in size with greater than 50% respiratory variability, suggesting right atrial pressure of 3 mmHg. IAS/Shunts: No atrial level shunt detected by color flow Doppler. Additional Comments: Poor acoustic windows limit study.  LEFT VENTRICLE PLAX 2D LVIDd:         4.15 cm       Diastology LVIDs:         3.45 cm       LV e' lateral:   4.28 cm/s LV PW:         1.80 cm       LV E/e' lateral: 12.9 LV IVS:        1.90 cm       LV e' medial:    5.10 cm/s LVOT diam:     2.30 cm       LV E/e' medial:  10.8 LV SV:         27 ml LV SV Index:   11.89 LVOT Area:     4.15 cm  LV Volumes (MOD) LV area d, A2C:    32.40 cm LV area d, A4C:    44.20 cm LV area s, A2C:    23.80 cm LV area s, A4C:    34.40 cm LV major d, A2C:   7.00 cm LV major d, A4C:   9.03 cm LV major s, A2C:   6.45 cm LV major s, A4C:   8.07 cm LV vol d, MOD A2C: 125.0 ml LV vol d, MOD A4C: 178.0 ml LV vol s, MOD A2C: 73.8 ml LV vol s, MOD A4C: 121.0 ml LV SV MOD A2C:     51.2 ml LV SV MOD A4C:     178.0 ml LV SV MOD BP:      63.7 ml RIGHT VENTRICLE             IVC RV S prime:     12.30  cm/s  IVC diam: 1.00 cm TAPSE (M-mode): 2.2 cm LEFT ATRIUM             Index       RIGHT ATRIUM           Index LA diam:        3.80 cm 1.76 cm/m  RA Area:     10.30 cm LA Vol (A2C):   57.1 ml 26.52 ml/m RA Volume:   17.90 ml  8.31 ml/m LA Vol (A4C):   37.2 ml 17.28 ml/m LA Biplane Vol: 49.7 ml 23.08 ml/m  AORTIC VALVE LVOT Vmax:   76.60 cm/s LVOT Vmean:  48.200 cm/s LVOT VTI:  0.138 m  AORTA Ao Root diam: 3.30 cm Ao Asc diam:  2.80 cm MITRAL VALVE MV Area (PHT): 5.13 cm             SHUNTS MV PHT:        42.92 msec           Systemic VTI:  0.14 m MV Decel Time: 148 msec             Systemic Diam: 2.30 cm MV E velocity: 55.30 cm/s 103 cm/s MV A velocity: 61.80 cm/s 70.3 cm/s MV E/A ratio:  0.89       1.5  Dietrich Pates MD Electronically signed by Dietrich Pates MD Signature Date/Time: 11/10/2019/4:32:25 PM    Final     Cardiac Studies   2D echo reviewed. EF 40-45%.   Patient Profile     34 y.o. male admitted with acute renal failure and hyperkalemia and NSAID over use.   Assessment & Plan    1. Chronic systolic heart failure - his EF is essentially unchanged from 1/18. Hold ARNI, aldactone and torsemide. Follow renal function. 2. Acute renal failure - his creatinine continues to improve. His fluid status appears to be back to baseline. I suspect it will take a few more days for his renal function to improve back to baseline. 3. Hypertriglyceridemia - continue statin and start Vascepa. Note plans by Dr. Dorthea Cove to start a fibrate once renal function improves. 4. Probable gout - his uric acid is increased. Consider colchicine once renal function improves. I'll defer to medical team.    For questions or updates, please contact [facility] Please consult [email or web address] for contact info under Cardiology/STEMI.   Signed, Lewayne Bunting, MD  11/12/2019, 9:31 AM  Patient ID: Steven Chase, male   DOB: 21-Aug-1986, 34 y.o.   MRN: [id number]

## 2019-11-12 NOTE — Progress Notes (Signed)
I clarified with Dr. Roda Shutters about IVF order. Patient is drinking fluids at quite a heavy rate. I informed her of that. She stated it was ok for him to drink as much as he wanted today and we would D/C the ivf and have his kidney function checked in am

## 2019-11-12 NOTE — Progress Notes (Signed)
Patient CPAP setup and at bedside, patient not ready to wear at this time and will call when he is ready to place on CPAP.

## 2019-11-12 NOTE — Progress Notes (Signed)
[facility] Progress Note   Subjective:   HTN this AM.  He feels fine.  Meds are being adjusted.  Elbow pain with uric acid 14+.   Objective Vitals:   11/12/19 0500 11/12/19 0534 11/12/19 0705 11/12/19 0841  BP: (!) 170/110 (!) 184/119 (!) 182/110 (!) 158/98  Pulse: (!) 105 (!) 107    Resp:      Temp: 98.6 F (37 C)     TempSrc: Oral     SpO2: 99%     Weight: 107.1 kg     Height:       Physical Exam General: well appearing obese Heart: RRR  Lungs:clear  Abdomen: soft, obese Extremities: no edema  Additional Objective Labs: Basic Metabolic Panel: Recent Labs  Lab 11/11/19 0240 11/11/19 1102 11/12/19 0100  NA 134* 133* 136  K 5.4* 5.1  5.0 4.4  CL 107 108 107  CO2 16* 19* 17*  GLUCOSE 95 102* 122*  BUN 83* 70* 57*  CREATININE 4.39* 3.53* 2.71*  CALCIUM 9.3 9.1 9.2  PHOS 5.5*  --   --    Liver Function Tests: Recent Labs  Lab 11/10/19 1342 11/11/19 0240  AST 18 23  ALT 18 23  ALKPHOS 43 41  BILITOT 0.4 0.5  PROT 8.1 7.9  ALBUMIN 4.3 4.2   No results for input(s): LIPASE, AMYLASE in the last 168 hours. CBC: Recent Labs  Lab 11/10/19 1342 11/10/19 2005 11/11/19 0240 11/12/19 0100  WBC 9.5 12.8* 10.1 11.0*  NEUTROABS  --   --   --  7.5  HGB 10.9* 11.0* 10.8* 10.8*  HCT 33.7* 33.6* 32.4* 31.9*  MCV 82.4 81.0 79.8* 79.2*  PLT 274 292 271 291   Blood Culture    Component Value Date/Time   SDES THROAT 11/28/2017 1359   SPECREQUEST NONE 11/28/2017 1359   CULT FEW STREPTOCOCCUS,BETA HEMOLYTIC NOT GROUP A 11/28/2017 1359   REPTSTATUS 11/30/2017 FINAL 11/28/2017 1359    Cardiac Enzymes: No results for input(s): CKTOTAL, CKMB, CKMBINDEX, TROPONINI in the last 168 hours. CBG: Recent Labs  Lab 11/10/19 1526  GLUCAP 129*   Iron Studies:  Recent Labs    11/12/19 0100  IRON 71  TIBC 412  FERRITIN 377*   @[email or web address] @ Studies/Results: No results found. Medications:  . carvedilol  12.5 mg Oral BID WC  . colchicine  0.6  mg Oral Daily  . fenofibrate  160 mg Oral Daily  . heparin  5,000 Units Subcutaneous Q8H  . hydrALAZINE  50 mg Oral Q8H  . icosapent Ethyl  2 g Oral BID  . isosorbide mononitrate  30 mg Oral Daily  . [START ON 11/13/2019] predniSONE  20 mg Oral Q breakfast  . rosuvastatin  40 mg Oral q1800  . senna-docusate  1 tablet Oral BID    Assessment/Plan: **AKI on CKD 3: severe AKI with Cr 6, from baseline of 1.5-1.8.  I suspect this is multifactorial with factors including modest hypotension, entresto, spironolactone, recent heavy NSAID use.  Improved tremendously already with holding diuretics, fluids, entresto.  No further IV fluids today - appears euvolemic so would give it another day before reintroducing diuretics.  Hopefully will recover to baseline.  Will plan to follow in nephrology clinic after d/c --> I will arrange f/u with myself in ~2-3 mo given close f/u with heart failure clinic.  I can see him sooner if needed.   **Hyperkalemia, severe: resolved  **HTN: BP up - cardiology reintroducing meds  **HFrEF: recent EF 45-50% which is  much improved. Care per cardiology.  I don't think this admission represents a contraindication to future entresto use but would use lower dose with close follow up of K/Cr.  Diuretics per HF team.  **Anemia:  Hb was 16-17s during 07/2019 admission, noted to be 10.9 (WBC and plt ok).  Iron studies pending  **OSA: CPAP qhs  **Obesity:  Discussed healthy weight loss; hes interested in bariatric surgery.   I will sign off, please call me with questions or concerns.  F/u with me in outpt clinic 2-54mo.  Jannifer Hick MD 11/12/2019, 1:56 PM  [facility]

## 2019-11-12 NOTE — Progress Notes (Signed)
Advanced Heart Failure Rounding Note   Subjective:    Diuretics remain on hold. Creatinine improving.   BP remains very high.   Still c/o right elbow pain. Uric acid 14.2  TGs up to 1,300. No ab pain   Objective:   Weight Range:  Vital Signs:   Temp:  [98.1 F (36.7 C)-98.6 F (37 C)] 98.6 F (37 C) (01/10 0500) Pulse Rate:  [99-107] 107 (01/10 0534) Resp:  [21] 21 (01/09 2035) BP: (157-184)/(98-119) 158/98 (01/10 0841) SpO2:  [99 %] 99 % (01/10 0500) Weight:  [107.1 kg] 107.1 kg (01/10 0500) Last BM Date: 11/11/19  Weight change: Filed Weights   11/10/19 1951 11/11/19 0240 11/12/19 0500  Weight: 108.3 kg 108.1 kg 107.1 kg    Intake/Output:   Intake/Output Summary (Last 24 hours) at 11/12/2019 1102 Last data filed at 11/12/2019 0540 Gross per 24 hour  Intake 1250 ml  Output 775 ml  Net 475 ml     Physical Exam: General:  Siting up on side of bed  No resp difficulty HEENT: normal Neck: supple. JVP flat . Carotids 2+ bilat; no bruits. No lymphadenopathy or thryomegaly appreciated. Cor: PMI nondisplaced. Tachy regualr. No rubs, gallops or murmurs. Lungs: clear Abdomen: obese soft, nontender, nondistended. No hepatosplenomegaly. No bruits or masses. Good bowel sounds. Extremities: no cyanosis, clubbing, rash, edema R elbow TTP limited ROM Neuro: alert & orientedx3, cranial nerves grossly intact. moves all 4 extremities w/o difficulty. Affect pleasant  Telemetry: Sinus tach 100-110 Personally reviewed  Labs: Basic Metabolic Panel: Recent Labs  Lab 11/10/19 1230 11/10/19 1342 11/10/19 1644 11/10/19 2005 11/11/19 0240 11/11/19 1102 11/12/19 0100  NA 133* 135  --   --  134* 133* 136  K 7.1* >7.5* 5.9* 6.2* 5.4* 5.1  5.0 4.4  CL 108 107  --   --  107 108 107  CO2 16* 17*  --   --  16* 19* 17*  GLUCOSE 131* 102*  --   --  95 102* 122*  BUN 94* 95*  --   --  83* 70* 57*  CREATININE 5.97* 6.06*  --  5.44* 4.39* 3.53* 2.71*  CALCIUM 9.5 9.7  --   --   9.3 9.1 9.2  PHOS  --   --   --   --  5.5*  --   --     Liver Function Tests: Recent Labs  Lab 11/10/19 1342 11/11/19 0240  AST 18 23  ALT 18 23  ALKPHOS 43 41  BILITOT 0.4 0.5  PROT 8.1 7.9  ALBUMIN 4.3 4.2   No results for input(s): LIPASE, AMYLASE in the last 168 hours. No results for input(s): AMMONIA in the last 168 hours.  CBC: Recent Labs  Lab 11/10/19 1342 11/10/19 2005 11/11/19 0240 11/12/19 0100  WBC 9.5 12.8* 10.1 11.0*  NEUTROABS  --   --   --  7.5  HGB 10.9* 11.0* 10.8* 10.8*  HCT 33.7* 33.6* 32.4* 31.9*  MCV 82.4 81.0 79.8* 79.2*  PLT 274 292 271 291    Cardiac Enzymes: No results for input(s): CKTOTAL, CKMB, CKMBINDEX, TROPONINI in the last 168 hours.  BNP: BNP (last 3 results) Recent Labs    07/15/19 1927 11/10/19 1027 11/10/19 1342  BNP 1,024.4* 9.2 13.4    ProBNP (last 3 results) No results for input(s): PROBNP in the last 8760 hours.    Other results:  Imaging:  No results found.   Medications:     Scheduled Medications: .  carvedilol  12.5 mg Oral BID WC  . fenofibrate  160 mg Oral Daily  . heparin  5,000 Units Subcutaneous Q8H  . hydrALAZINE  50 mg Oral Q8H  . icosapent Ethyl  2 g Oral BID  . isosorbide mononitrate  30 mg Oral Daily  . [START ON 11/13/2019] predniSONE  20 mg Oral Q breakfast  . rosuvastatin  40 mg Oral q1800  . senna-docusate  1 tablet Oral BID     Infusions: . sodium chloride       PRN Medications:  acetaminophen, hydrALAZINE, HYDROcodone-acetaminophen, ondansetron **OR** ondansetron (ZOFRAN) IV   Assessment/plan    1. AKI on CKD3a - due to volume depletion and NSAIDs - improving with IVFs and holding diuretics/HR meds. Cr 4.39 -> 3.53 -> 2.71 - continue to hold diuretics/ARNI and spiro - baseline creatinine 1.5-1.9  2. Chronic systolic HF due to NICM (likely hypertensive) - EF previosuly 15-20% - echo 1/8 EF improved to 45% - holding ARNI, spiro and torsemide - BP up. Hydralazine  added back overnight. I increased to 50 tid. Goal SBP 120-140 - Likely add back Entresto in am   3. HTN - BP still high, As above.   4. Microcytic anemia - Iron stores ok   5. Severe hypertriglyceridemia - TGs 1,100-> 1,300 -  Vascepa and statin started yesterday - very lowfat diet - Now that CrCl > 30 will start fenofibrate - If develops signs of hyperviscosity (AMS, SOB, visual changes) can consider plasmapheresis   6. Hyperkalemia - improved  7. R elbow pain likely acute gout - Uric acid 14.2 - I increased prednisone to 20mg  daily - can use colchicine 0.6 daily. Discussed dosing with PharmD personally.   Length of Stay: 2   Glori Bickers MD 11/12/2019, 11:02 AM  Advanced Heart Failure Team Pager [phone] (M-F; [place])  Please contact [facility] for night-coverage after hours (4p -7a ) and weekends on [email or web address]

## 2019-11-13 LAB — CBC WITH DIFFERENTIAL/PLATELET
Abs Immature Granulocytes: 0.07 10*3/uL (ref 0.00–0.07)
Basophils Absolute: 0.1 10*3/uL (ref 0.0–0.1)
Basophils Relative: 1 %
Eosinophils Absolute: 0.2 10*3/uL (ref 0.0–0.5)
Eosinophils Relative: 2 %
HCT: 30.2 % — ABNORMAL LOW (ref 39.0–52.0)
Hemoglobin: 10.4 g/dL — ABNORMAL LOW (ref 13.0–17.0)
Immature Granulocytes: 1 %
Lymphocytes Relative: 24 %
Lymphs Abs: 2.8 10*3/uL (ref 0.7–4.0)
MCH: 26.9 pg (ref 26.0–34.0)
MCHC: 34.4 g/dL (ref 30.0–36.0)
MCV: 78.2 fL — ABNORMAL LOW (ref 80.0–100.0)
Monocytes Absolute: 1.5 10*3/uL — ABNORMAL HIGH (ref 0.1–1.0)
Monocytes Relative: 12 %
Neutro Abs: 7.1 10*3/uL (ref 1.7–7.7)
Neutrophils Relative %: 60 %
Platelets: 271 10*3/uL (ref 150–400)
RBC: 3.86 MIL/uL — ABNORMAL LOW (ref 4.22–5.81)
RDW: 19.2 % — ABNORMAL HIGH (ref 11.5–15.5)
WBC: 11.7 10*3/uL — ABNORMAL HIGH (ref 4.0–10.5)
nRBC: 0 % (ref 0.0–0.2)

## 2019-11-13 LAB — BASIC METABOLIC PANEL
Anion gap: 10 (ref 5–15)
BUN: 40 mg/dL — ABNORMAL HIGH (ref 6–20)
CO2: 19 mmol/L — ABNORMAL LOW (ref 22–32)
Calcium: 9.2 mg/dL (ref 8.9–10.3)
Chloride: 106 mmol/L (ref 98–111)
Creatinine, Ser: 2.4 mg/dL — ABNORMAL HIGH (ref 0.61–1.24)
GFR calc Af Amer: 39 mL/min — ABNORMAL LOW (ref >60)
GFR calc non Af Amer: 34 mL/min — ABNORMAL LOW (ref >60)
Glucose, Bld: 107 mg/dL — ABNORMAL HIGH (ref 70–99)
Potassium: 4.6 mmol/L (ref 3.5–5.1)
Sodium: 135 mmol/L (ref 135–145)

## 2019-11-13 MED ORDER — ROSUVASTATIN CALCIUM 40 MG PO TABS: 40.0000 mg | ORAL_TABLET | Freq: Every day | ORAL | 0 refills | Status: DC

## 2019-11-13 MED ORDER — HYDRALAZINE HCL 50 MG PO TABS: 75.0000 mg | ORAL_TABLET | Freq: Three times a day (TID) | ORAL | Status: DC

## 2019-11-13 MED ORDER — FENOFIBRATE 160 MG PO TABS: 160.0000 mg | ORAL_TABLET | Freq: Every day | ORAL | 0 refills | Status: DC

## 2019-11-13 MED ORDER — HYDRALAZINE HCL 50 MG PO TABS
75.0000 mg | ORAL_TABLET | Freq: Three times a day (TID) | ORAL | Status: DC
  Filled 2019-11-13: qty 1

## 2019-11-13 MED ORDER — SACUBITRIL-VALSARTAN 24-26 MG PO TABS
1.0000 | ORAL_TABLET | Freq: Two times a day (BID) | ORAL | Status: DC
  Administered 2019-11-13: 1 via ORAL
  Filled 2019-11-13: qty 1

## 2019-11-13 MED ORDER — SACUBITRIL-VALSARTAN 24-26 MG PO TABS: 1.0000 | ORAL_TABLET | Freq: Two times a day (BID) | ORAL | 0 refills | Status: DC

## 2019-11-13 MED ORDER — ICOSAPENT ETHYL 1 G PO CAPS: 2.0000 g | ORAL_CAPSULE | Freq: Two times a day (BID) | ORAL | 0 refills | Status: DC

## 2019-11-13 MED ORDER — ISOSORBIDE MONONITRATE ER 60 MG PO TB24
60.0000 mg | ORAL_TABLET | Freq: Every day | ORAL | Status: DC
  Administered 2019-11-13: 60 mg via ORAL
  Filled 2019-11-13: qty 1

## 2019-11-13 MED ORDER — CARVEDILOL 12.5 MG PO TABS: 12.5000 mg | ORAL_TABLET | Freq: Two times a day (BID) | ORAL | 0 refills | Status: DC

## 2019-11-13 MED ORDER — PREDNISONE 20 MG PO TABS: 20.0000 mg | ORAL_TABLET | Freq: Every day | ORAL | 0 refills | Status: AC

## 2019-11-13 MED ORDER — HYDRALAZINE HCL 50 MG PO TABS
50.0000 mg | ORAL_TABLET | Freq: Three times a day (TID) | ORAL | Status: DC
  Administered 2019-11-13: 50 mg via ORAL

## 2019-11-13 MED ORDER — HYDRALAZINE HCL 50 MG PO TABS: 50.0000 mg | ORAL_TABLET | Freq: Three times a day (TID) | ORAL | 0 refills | Status: DC

## 2019-11-13 NOTE — Progress Notes (Addendum)
Advanced Heart Failure Rounding Note   Subjective:   Yesterday hydralazine increased to 50 mg tid.   Off diuretics creatinine trending down 2.7>2.4   Wants to go home. Denies SOB. Elbow resolved.   Objective:   Weight Range:  Vital Signs:   Temp:  [97.9 F (36.6 C)-98 F (36.7 C)] 98 F (36.7 C) (01/11 0502) Pulse Rate:  [106-116] 116 (01/11 0502) Resp:  [19-20] 20 (01/11 0502) BP: (127-158)/(86-112) 158/112 (01/11 0502) SpO2:  [97 %-98 %] 98 % (01/11 0502) Weight:  [107.4 kg] 107.4 kg (01/11 0502) Last BM Date: 11/12/19  Weight change: Filed Weights   11/11/19 0240 11/12/19 0500 11/13/19 0502  Weight: 108.1 kg 107.1 kg 107.4 kg    Intake/Output:   Intake/Output Summary (Last 24 hours) at 11/13/2019 0721 Last data filed at 11/13/2019 0534 Gross per 24 hour  Intake 1110 ml  Output 525 ml  Net 585 ml     Physical Exam: General:  No resp difficulty. Sitting on the side of the bed.  HEENT: normal Neck: supple. no JVD. Carotids 2+ bilat; no bruits. No lymphadenopathy or thryomegaly appreciated. Cor: PMI nondisplaced. Regular rate & rhythm. No rubs, gallops or murmurs. Lungs: clear Abdomen: soft, nontender, nondistended. No hepatosplenomegaly. No bruits or masses. Good bowel sounds. Extremities: no cyanosis, clubbing, rash, edema Neuro: alert & orientedx3, cranial nerves grossly intact. moves all 4 extremities w/o difficulty. Affect pleasant  Telemetry: Sinus Tach 110-120s  Labs: Basic Metabolic Panel: Recent Labs  Lab 11/10/19 1342 11/10/19 2005 11/11/19 0240 11/11/19 1102 11/12/19 0100 11/13/19 0350  NA 135  --  134* 133* 136 135  K >7.5* 6.2* 5.4* 5.1  5.0 4.4 4.6  CL 107  --  107 108 107 106  CO2 17*  --  16* 19* 17* 19*  GLUCOSE 102*  --  95 102* 122* 107*  BUN 95*  --  83* 70* 57* 40*  CREATININE 6.06* 5.44* 4.39* 3.53* 2.71* 2.40*  CALCIUM 9.7  --  9.3 9.1 9.2 9.2  PHOS  --   --  5.5*  --   --   --     Liver Function Tests: Recent Labs    Lab 11/10/19 1342 11/11/19 0240  AST 18 23  ALT 18 23  ALKPHOS 43 41  BILITOT 0.4 0.5  PROT 8.1 7.9  ALBUMIN 4.3 4.2   No results for input(s): LIPASE, AMYLASE in the last 168 hours. No results for input(s): AMMONIA in the last 168 hours.  CBC: Recent Labs  Lab 11/10/19 1342 11/10/19 2005 11/11/19 0240 11/12/19 0100 11/13/19 0350  WBC 9.5 12.8* 10.1 11.0* 11.7*  NEUTROABS  --   --   --  7.5 7.1  HGB 10.9* 11.0* 10.8* 10.8* 10.4*  HCT 33.7* 33.6* 32.4* 31.9* 30.2*  MCV 82.4 81.0 79.8* 79.2* 78.2*  PLT 274 292 271 291 271    Cardiac Enzymes: No results for input(s): CKTOTAL, CKMB, CKMBINDEX, TROPONINI in the last 168 hours.  BNP: BNP (last 3 results) Recent Labs    07/15/19 1927 11/10/19 1027 11/10/19 1342  BNP 1,024.4* 9.2 13.4    ProBNP (last 3 results) No results for input(s): PROBNP in the last 8760 hours.    Other results:  Imaging: No results found.   Medications:     Scheduled Medications: . carvedilol  12.5 mg Oral BID WC  . colchicine  0.6 mg Oral Daily  . fenofibrate  160 mg Oral Daily  . heparin  5,000 Units Subcutaneous  Q8H  . hydrALAZINE  50 mg Oral Q8H  . icosapent Ethyl  2 g Oral BID  . isosorbide mononitrate  30 mg Oral Daily  . predniSONE  20 mg Oral Q breakfast  . rosuvastatin  40 mg Oral q1800  . senna-docusate  1 tablet Oral BID    Infusions:   PRN Medications: acetaminophen, hydrALAZINE, HYDROcodone-acetaminophen, ondansetron **OR** ondansetron (ZOFRAN) IV   Assessment/plan    1. AKI on CKD3a - due to volume depletion and NSAIDs - improving with IVFs and holding diuretics/HR meds. Cr 4.39 -> 3.53 -> 2.71->2.4 - continue to hold diuretics/ARNI and spiro - baseline creatinine 1.5-1.9  2. Chronic systolic HF due to NICM (likely hypertensive) - EF previosuly 15-20% - echo 1/8 EF improved to 45% - Continue carvedilol 12.5 mg twice a day  - holding, spiro and torsemide - Increase hydralazine 75 mg tid and  increase imdur to 60 mg daily.  - Add 24-26 mg entresto twice a day. BMET in am.   3. HTN -Elevated.  - See above.   4. Microcytic anemia - Iron stores ok   5. Severe hypertriglyceridemia - TGs 1,100-> 1,300 -  Vascepa and statin 11/11/19 - very lowfat diet - Now that CrCl > 30  Fenofibrate was added.  - If develops signs of hyperviscosity (AMS, SOB, visual changes) can consider plasmapheresis   6. Hyperkalemia - Resolved.   7. R elbow pain likely acute gout - Uric acid 14.2 -Continue prednisone to 20mg  daily - can use colchicine 0.6 daily. Discussed dosing with PharmD personally.  8. OSA Using CPAP qhs.   Plan to continue HF Paramedicine at discharge.  He was referred to the Lipid Clinic. HF follow up set up for next week. We will check BMET at that time.   HF meds for d/c  -Entresto 24-26 mg twice a day.  -Carvedilol 12.5 mg twice a day.  -Hydralazine 75 mg three times a d ay  -Imdur 60 mg daily - Restart torsemide 20 mg daily 1/13 -Vascepa 2 g po twice a day  -Crestor 40 mg daily  -Fenofibrate 160 mg daily     Length of Stay: 3   Amy Clegg NP-C  11/13/2019, 7:21 AM  Advanced Heart Failure Team Pager [phone] (M-F; 7a - 4p)  Please contact [facility] for night-coverage after hours (4p -7a ) and weekends on [email or web address]  Patient seen and examined with the above-signed Advanced Practice Provider and/or Housestaff. I personally reviewed laboratory data, imaging studies and relevant notes. I independently examined the patient and formulated the important aspects of the plan. I have edited the note to reflect any of my changes or salient points. I have personally discussed the plan with the patient and/or family.  He is much improved. Renal function better. Gout pain resolved. HF meds restarted with rosemide at lower dose. Ok to d/c home today. Will need f/u in HF Clinic soon to reassess volume status.  Will need f/u ASAP in lipid clinic to manage hyperTG.    [phone], MD  6:02 PM

## 2019-11-13 NOTE — Discharge Summary (Signed)
Physician Discharge Summary  Steven Chase ZOX:[id number] DOB: Jul 21, 1986 DOA: 11/10/2019  PCP: Steven Levins, MD  Admit date: 11/10/2019 Discharge date: 11/13/2019  Admitted From: home Disposition:  home  Recommendations for Outpatient Follow-up:  1. Follow up with heart failure clinic in 1 weeks 2. Please obtain BMP/CBC in one week 3. Follow up PCP 1-2 weeks 4. Nephrology follow up 2-3 months 5. Please follow up on the following pending results:  Home Health:no Equipment/Devices:no  Discharge Condition:stable CODE STATUS:full Diet recommendation: Heart Healthy  Brief/Interim Summary: Patient was seen the heart failure clinic where he was noted to have hyperkalemia, AKI, he is referred to the ED  In the ED, patient had a normal-looking EKG but received temporizing measures with calcium gluconate dextrose 50% 50 mL insulin IV and received Lokelma and Kayexalatefor severe hyperkalemia. Nephrology was consulted from the ED. Patient also received 250 mL IV fluid bolus followed by 75 mL/h.  Hospital course: Given fluids for AKI and treatment for hyperkalemia. Some heart failure medications held due to low BP and renal function. These were gradually restarted with improvement in renal function. He had episode of elbow gout treated with prednisone with resolution of his symptoms. Cholesterol and triglyceride levels were very high and medications were started/adjusted for his cholesterol while inpatient. He was discharged with reduced medications for heart failure and close follow up with heart failure clinic.   Discharge Diagnoses:  Principal Problem:   Hyperkalemia Active Problems:   Essential hypertension   OSA (obstructive sleep apnea)   Obesity   CKD (chronic kidney disease), stage II   Chronic systolic heart failure (HCC)   AKI (acute kidney injury) [facility])  Discharge Instructions  Discharge Instructions    (HEART FAILURE PATIENTS) Call MD:  Anytime you have any of the  following symptoms: 1) 3 pound weight gain in 24 hours or 5 pounds in 1 week 2) shortness of breath, with or without a dry hacking cough 3) swelling in the hands, feet or stomach 4) if you have to sleep on extra pillows at night in order to breathe.   Complete by: As directed    Diet - low sodium heart healthy   Complete by: As directed    Heart Failure patients record your daily weight using the same scale at the same time of day   Complete by: As directed    Increase activity slowly   Complete by: As directed    STOP any activity that causes chest pain, shortness of breath, dizziness, sweating, or exessive weakness   Complete by: As directed      Allergies as of 11/13/2019   No Known Allergies     Medication List    STOP taking these medications   Entresto 49-51 MG Generic drug: sacubitril-valsartan Replaced by: sacubitril-valsartan 24-26 MG   ibuprofen 600 MG tablet Commonly known as: ADVIL   potassium chloride SA 20 MEQ tablet Commonly known as: KLOR-CON   spironolactone 50 MG tablet Commonly known as: ALDACTONE   torsemide 20 MG tablet Commonly known as: DEMADEX     TAKE these medications   carvedilol 12.5 MG tablet Commonly known as: COREG Take 1 tablet (12.5 mg total) by mouth 2 (two) times daily with a meal. What changed:   medication strength  how much to take   fenofibrate 160 MG tablet Take 1 tablet (160 mg total) by mouth daily. Start taking on: November 14, 2019   hydrALAZINE 50 MG tablet Commonly known as: APRESOLINE Take 1 tablet (  50 mg total) by mouth every 8 (eight) hours. What changed:   medication strength  how much to take   icosapent Ethyl 1 g capsule Commonly known as: VASCEPA Take 2 capsules (2 g total) by mouth 2 (two) times daily.   isosorbide mononitrate 60 MG 24 hr tablet Commonly known as: IMDUR Take 1 tablet (60 mg total) by mouth daily.   predniSONE 20 MG tablet Commonly known as: DELTASONE Take 1 tablet (20 mg total) by  mouth daily with breakfast for 3 days. Start taking on: November 14, 2019   rosuvastatin 40 MG tablet Commonly known as: CRESTOR Take 1 tablet (40 mg total) by mouth daily at 6 PM.   sacubitril-valsartan 24-26 MG Commonly known as: ENTRESTO Take 1 tablet by mouth 2 (two) times daily. ReplacesSherryll Burger 57-47 MG      Follow-up Information    Steven Levins, MD Follow up.   Specialties: Internal Medicine, Radiology Contact information: [address] [place] [address] [phone]        [facility] Follow up on 11/21/2019.   Specialty: Cardiology Why: at 11:00 Contact information: [address] [id number] [place] [address] [phone]         No Known Allergies  Consultations:  Heart failure/cardiology  Nephrology   Procedures/Studies: DG Knee Complete 4 Views Left  Result Date: 11/08/2019 CLINICAL DATA:  Pain EXAM: LEFT KNEE - COMPLETE 4+ VIEW COMPARISON:  None. FINDINGS: No evidence of fracture, dislocation, or joint effusion. No evidence of arthropathy or other focal bone abnormality. Soft tissues are unremarkable. There is a small suprapatellar joint effusion. IMPRESSION: Small suprapatellar joint effusion. No acute bony abnormality. Electronically Signed   By: Katherine Mantle M.D.   On: 11/08/2019 15:45   ECHOCARDIOGRAM COMPLETE  Result Date: 11/10/2019   ECHOCARDIOGRAM REPORT   Patient Name:   Steven Chase Mcgue Date of Exam: 11/10/2019 Medical Rec #:  [id number]       Height:       66.0 in Accession #:    [id number]      Weight:       238.0 lb Date of Birth:  1986-09-02      BSA:          2.15 m Patient Age:    33 years        BP:           97/58 mmHg Patient Gender: M               HR:           88 bpm. Exam Location:  Outpatient Procedure: 2D Echo, Cardiac Doppler, Color Doppler and Strain Analysis Indications:    I50.40* Unspecified combined systolic (congestive) and diastolic                  (congestive) heart failure  History:        Patient has prior history of Echocardiogram examinations, most                 recent 07/16/2019. Cardiomyopathy and CHF, Signs/Symptoms:Chest                 Pain; Risk Factors:Hypertension and Sleep Apnea. Edema.  Sonographer:    Sheralyn Boatman Referring Phys: [phone] AMY D CLEGG  Sonographer Comments: Technically difficult study due to poor echo windows and patient is morbidly obese. Image acquisition challenging due to patient body habitus. IMPRESSIONS  1. Left ventricular  ejection fraction, by visual estimation, is 40 to 45%. The left ventricle has moderately decreased function. There is severely increased left ventricular hypertrophy.  2. Left ventricular diastolic parameters are consistent with Grade I diastolic dysfunction (impaired relaxation).  3. The left ventricle demonstrates regional wall motion abnormalities.  4. Poor acoustic windows limit study LVEF appears improved compared to report from September 2020.  5. Global right ventricle has moderately reduced systolic function.The right ventricular size is normal. No increase in right ventricular wall thickness.  6. Left atrial size was normal.  7. Right atrial size was normal.  8. The mitral valve is normal in structure. No evidence of mitral valve regurgitation.  9. The tricuspid valve is normal in structure. 10. The aortic valve is tricuspid. Aortic valve regurgitation is not visualized. Mild aortic valve sclerosis without stenosis. 11. The pulmonic valve was normal in structure. Pulmonic valve regurgitation is not visualized. 12. The inferior vena cava is normal in size with greater than 50% respiratory variability, suggesting right atrial pressure of 3 mmHg. 13. Poor acoustic windows limit study. FINDINGS  Left Ventricle: Left ventricular ejection fraction, by visual estimation, is 40 to 45%. The left ventricle has moderately decreased function. The left ventricle demonstrates regional wall motion  abnormalities. The left ventricular internal cavity size was the left ventricle is normal in size. There is severely increased left ventricular hypertrophy. Left ventricular diastolic parameters are consistent with Grade I diastolic dysfunction (impaired relaxation). Normal left atrial pressure. Poor acoustic windows limit study LVEF appears improved compared to report from September 2020. Right Ventricle: The right ventricular size is normal. No increase in right ventricular wall thickness. Global RV systolic function is has moderately reduced systolic function. Left Atrium: Left atrial size was normal in size. Right Atrium: Right atrial size was normal in size Pericardium: There is no evidence of pericardial effusion. Mitral Valve: The mitral valve is normal in structure. No evidence of mitral valve regurgitation. Tricuspid Valve: The tricuspid valve is normal in structure. Tricuspid valve regurgitation is trivial. Aortic Valve: The aortic valve is tricuspid. Aortic valve regurgitation is not visualized. Mild aortic valve sclerosis is present, with no evidence of aortic valve stenosis. Pulmonic Valve: The pulmonic valve was normal in structure. Pulmonic valve regurgitation is not visualized. Pulmonic regurgitation is not visualized. Aorta: The aortic root is normal in size and structure. Venous: The inferior vena cava is normal in size with greater than 50% respiratory variability, suggesting right atrial pressure of 3 mmHg. IAS/Shunts: No atrial level shunt detected by color flow Doppler. Additional Comments: Poor acoustic windows limit study.  LEFT VENTRICLE PLAX 2D LVIDd:         4.15 cm       Diastology LVIDs:         3.45 cm       LV e' lateral:   4.28 cm/s LV PW:         1.80 cm       LV E/e' lateral: 12.9 LV IVS:        1.90 cm       LV e' medial:    5.10 cm/s LVOT diam:     2.30 cm       LV E/e' medial:  10.8 LV SV:         27 ml LV SV Index:   11.89 LVOT Area:     4.15 cm  LV Volumes (MOD) LV area d,  A2C:    32.40 cm LV area d, A4C:  44.20 cm LV area s, A2C:    23.80 cm LV area s, A4C:    34.40 cm LV major d, A2C:   7.00 cm LV major d, A4C:   9.03 cm LV major s, A2C:   6.45 cm LV major s, A4C:   8.07 cm LV vol d, MOD A2C: 125.0 ml LV vol d, MOD A4C: 178.0 ml LV vol s, MOD A2C: 73.8 ml LV vol s, MOD A4C: 121.0 ml LV SV MOD A2C:     51.2 ml LV SV MOD A4C:     178.0 ml LV SV MOD BP:      63.7 ml RIGHT VENTRICLE             IVC RV S prime:     12.30 cm/s  IVC diam: 1.00 cm TAPSE (M-mode): 2.2 cm LEFT ATRIUM             Index       RIGHT ATRIUM           Index LA diam:        3.80 cm 1.76 cm/m  RA Area:     10.30 cm LA Vol (A2C):   57.1 ml 26.52 ml/m RA Volume:   17.90 ml  8.31 ml/m LA Vol (A4C):   37.2 ml 17.28 ml/m LA Biplane Vol: 49.7 ml 23.08 ml/m  AORTIC VALVE LVOT Vmax:   76.60 cm/s LVOT Vmean:  48.200 cm/s LVOT VTI:    0.138 m  AORTA Ao Root diam: 3.30 cm Ao Asc diam:  2.80 cm MITRAL VALVE MV Area (PHT): 5.13 cm             SHUNTS MV PHT:        42.92 msec           Systemic VTI:  0.14 m MV Decel Time: 148 msec             Systemic Diam: 2.30 cm MV E velocity: 55.30 cm/s 103 cm/s MV A velocity: 61.80 cm/s 70.3 cm/s MV E/A ratio:  0.89       1.5  Dietrich Pates MD Electronically signed by Dietrich Pates MD Signature Date/Time: 11/10/2019/4:32:25 PM    Final      Subjective: Patient is feeling well day of discharge. Elbow is improved. Denies concerns. Wants to go home. Denies chest pains or SOB. Denies abdominal pain, diarrhea, constipation, nausea/vomiting. Denies dizziness.   Discharge Exam: Vitals:   11/13/19 0803 11/13/19 1336  BP: (!) 163/129 103/66  Pulse: (!) 119 (!) 106  Resp:  20  Temp:  98.8 F (37.1 C)  SpO2:  98%   Vitals:   11/12/19 1929 11/13/19 0502 11/13/19 0803 11/13/19 1336  BP: 127/86 (!) 158/112 (!) 163/129 103/66  Pulse: (!) 106 (!) 116 (!) 119 (!) 106  Resp: 19 20  20   Temp: 97.9 F (36.6 C) 98 F (36.7 C)  98.8 F (37.1 C)  TempSrc: Oral Oral  Oral  SpO2: 97%  98%  98%  Weight:  107.4 kg    Height:        General: Pt is alert, awake, not in acute distress Cardiovascular: RRR, S1/S2 +, no rubs, no gallops Respiratory: CTA bilaterally, no wheezing, no rhonchi Abdominal: Soft, NT, ND, bowel sounds + Extremities: no edema, no cyanosis  The results of significant diagnostics from this hospitalization (including imaging, microbiology, ancillary and laboratory) are listed below for reference.     Microbiology: Recent Results (from the past 240 hour(s))  SARS CORONAVIRUS 2 (TAT 6-24 HRS) Nasopharyngeal Nasopharyngeal Swab     Status: None   Collection Time: 11/10/19  3:53 PM   Specimen: Nasopharyngeal Swab  Result Value Ref Range Status   SARS Coronavirus 2 NEGATIVE NEGATIVE Final    Comment: (NOTE) SARS-CoV-2 target nucleic acids are NOT DETECTED. The SARS-CoV-2 RNA is generally detectable in upper and lower respiratory specimens during the acute phase of infection. Negative results do not preclude SARS-CoV-2 infection, do not rule out co-infections with other pathogens, and should not be used as the sole basis for treatment or other patient management decisions. Negative results must be combined with clinical observations, patient history, and epidemiological information. The expected result is Negative. Fact Sheet for Patients: HairSlick.no Fact Sheet for Healthcare Providers: quierodirigir.com This test is not yet approved or cleared by the [place] FDA and  has been authorized for detection and/or diagnosis of SARS-CoV-2 by FDA under an Emergency Use Authorization (EUA). This EUA will remain  in effect (meaning this test can be used) for the duration of the COVID-19 declaration under Section 56 4(b)(1) of the Act, 21 U.S.C. section 360bbb-3(b)(1), unless the authorization is terminated or revoked sooner. Performed at [facility], [address] [place],  [place] [address]      Labs: BNP (last 3 results) Recent Labs    07/15/19 1927 11/10/19 1027 11/10/19 1342  BNP 1,024.4* 9.2 13.4   Basic Metabolic Panel: Recent Labs  Lab 11/10/19 1342 11/10/19 2005 11/11/19 0240 11/11/19 1102 11/12/19 0100 11/13/19 0350  NA 135  --  134* 133* 136 135  K >7.5* 6.2* 5.4* 5.1  5.0 4.4 4.6  CL 107  --  107 108 107 106  CO2 17*  --  16* 19* 17* 19*  GLUCOSE 102*  --  95 102* 122* 107*  BUN 95*  --  83* 70* 57* 40*  CREATININE 6.06* 5.44* 4.39* 3.53* 2.71* 2.40*  CALCIUM 9.7  --  9.3 9.1 9.2 9.2  PHOS  --   --  5.5*  --   --   --    Liver Function Tests: Recent Labs  Lab 11/10/19 1342 11/11/19 0240  AST 18 23  ALT 18 23  ALKPHOS 43 41  BILITOT 0.4 0.5  PROT 8.1 7.9  ALBUMIN 4.3 4.2   No results for input(s): LIPASE, AMYLASE in the last 168 hours. No results for input(s): AMMONIA in the last 168 hours. CBC: Recent Labs  Lab 11/10/19 1342 11/10/19 2005 11/11/19 0240 11/12/19 0100 11/13/19 0350  WBC 9.5 12.8* 10.1 11.0* 11.7*  NEUTROABS  --   --   --  7.5 7.1  HGB 10.9* 11.0* 10.8* 10.8* 10.4*  HCT 33.7* 33.6* 32.4* 31.9* 30.2*  MCV 82.4 81.0 79.8* 79.2* 78.2*  PLT 274 292 271 291 271   Cardiac Enzymes: No results for input(s): CKTOTAL, CKMB, CKMBINDEX, TROPONINI in the last 168 hours. BNP: Invalid input(s): POCBNP CBG: Recent Labs  Lab 11/10/19 1526  GLUCAP 129*   D-Dimer No results for input(s): DDIMER in the last 72 hours. Hgb A1c No results for input(s): HGBA1C in the last 72 hours. Lipid Profile Recent Labs    11/12/19 0100  CHOL 309*  HDL NOT REPORTED DUE TO HIGH TRIGLYCERIDES  LDLCALC UNABLE TO CALCULATE IF TRIGLYCERIDE OVER 400 mg/dL  TRIG 1,093*  CHOLHDL NOT REPORTED DUE TO HIGH TRIGLYCERIDES  LDLDIRECT 77.6   Thyroid function studies No results for input(s): TSH, T4TOTAL, T3FREE, THYROIDAB in the last 72  hours.  Invalid input(s): FREET3 Anemia work up Recent Labs    11/12/19 0100  VITAMINB12  323  FOLATE 10.9  FERRITIN 377*  TIBC 412  IRON 71  RETICCTPCT 1.6   Urinalysis    Component Value Date/Time   COLORURINE YELLOW 07/15/2019 1930   APPEARANCEUR CLEAR 07/15/2019 1930   LABSPEC 1.030 07/15/2019 1930   PHURINE 5.0 07/15/2019 1930   GLUCOSEU NEGATIVE 07/15/2019 1930   HGBUR SMALL (A) 07/15/2019 1930   BILIRUBINUR NEGATIVE 07/15/2019 1930   KETONESUR NEGATIVE 07/15/2019 1930   PROTEINUR >=300 (A) 07/15/2019 1930   UROBILINOGEN 0.2 01/12/2018 1038   NITRITE NEGATIVE 07/15/2019 1930   LEUKOCYTESUR NEGATIVE 07/15/2019 1930   Sepsis Labs Invalid input(s): PROCALCITONIN,  WBC,  LACTICIDVEN Microbiology Recent Results (from the past 240 hour(s))  SARS CORONAVIRUS 2 (TAT 6-24 HRS) Nasopharyngeal Nasopharyngeal Swab     Status: None   Collection Time: 11/10/19  3:53 PM   Specimen: Nasopharyngeal Swab  Result Value Ref Range Status   SARS Coronavirus 2 NEGATIVE NEGATIVE Final    Comment: (NOTE) SARS-CoV-2 target nucleic acids are NOT DETECTED. The SARS-CoV-2 RNA is generally detectable in upper and lower respiratory specimens during the acute phase of infection. Negative results do not preclude SARS-CoV-2 infection, do not rule out co-infections with other pathogens, and should not be used as the sole basis for treatment or other patient management decisions. Negative results must be combined with clinical observations, patient history, and epidemiological information. The expected result is Negative. Fact Sheet for Patients: SugarRoll.be Fact Sheet for Healthcare Providers: https://www.woods-mathews.com/ This test is not yet approved or cleared by the [place] FDA and  has been authorized for detection and/or diagnosis of SARS-CoV-2 by FDA under an Emergency Use Authorization (EUA). This EUA will remain  in effect (meaning this test can be used) for the duration of the COVID-19 declaration under Section 56 4(b)(1) of  the Act, 21 U.S.C. section 360bbb-3(b)(1), unless the authorization is terminated or revoked sooner. Performed at [facility], [place] [address] [place], [place] [address]      Time coordinating discharge: Over 30 minutes  SIGNED:  Hoyt Koch, MD  [facility] 11/13/2019, 4:05 PM Pager   If 7PM-7AM, please contact night-coverage [email or web address] Password [email or web address]

## 2019-11-13 NOTE — Discharge Instructions (Signed)
Please see heart failure clinic as scheduled. Follow up with your regular doctor in 1-2 weeks. See your kidney specialist in 2-3 months.   Heart Failure Eating Plan Heart failure, also called congestive heart failure, occurs when your heart does not pump blood well enough to meet your body's needs for oxygen-rich blood. Heart failure is a long-term (chronic) condition. Living with heart failure can be challenging. However, following your health care provider's instructions about a healthy lifestyle and working with a diet and nutrition specialist (dietitian) to choose the right foods may help to improve your symptoms. What are tips for following this plan? Reading food labels  Check food labels for the amount of sodium per serving. Choose foods that have less than 140 mg (milligrams) of sodium in each serving.  Check food labels for the number of calories per serving. This is important if you need to limit your daily calorie intake to lose weight.  Check food labels for the serving size. If you eat more than one serving, you will be eating more sodium and calories than what is listed on the label.  Look for foods that are labeled as "sodium-free," "very low sodium," or "low sodium." ? Foods labeled as "reduced sodium" or "lightly salted" may still have more sodium than what is recommended for you. Cooking  Avoid adding salt when cooking. Ask your health care provider or dietitian before using salt substitutes.  Season food with salt-free seasonings, spices, or herbs. Check the label of seasoning mixes to make sure they do not contain salt.  Cook with heart-healthy oils, such as olive, canola, soybean, or sunflower oil.  Do not fry foods. Cook foods using low-fat methods, such as baking, boiling, grilling, and broiling.  Limit unhealthy fats when cooking by: ? Removing the skin from poultry, such as chicken. ? Removing all visible fats from meats. ? Skimming the fat off from stews, soups,  and gravies before serving them. Meal planning   Limit your intake of: ? Processed, canned, or pre-packaged foods. ? Foods that are high in trans fat, such as fried foods. ? Sweets, desserts, sugary drinks, and other foods with added sugar. ? Full-fat dairy products, such as whole milk.  Eat a balanced diet that includes: ? 4-5 servings of fruit each day and 4-5 servings of vegetables each day. At each meal, try to fill half of your plate with fruits and vegetables. ? Up to 6-8 servings of whole grains each day. ? Up to 2 servings of lean meat, poultry, or fish each day. One serving of meat is equal to 3 oz. This is about the same size as a deck of cards. ? 2 servings of low-fat dairy each day. ? Heart-healthy fats. Healthy fats called omega-3 fatty acids are found in foods such as flaxseed and cold-water fish like sardines, salmon, and mackerel.  Aim to eat 25-35 g (grams) of fiber a day. Foods that are high in fiber include apples, broccoli, carrots, beans, peas, and whole grains.  Do not add salt or condiments that contain salt (such as soy sauce) to foods before eating.  When eating at a restaurant, ask that your food be prepared with less salt or no salt, if possible.  Try to eat 2 or more vegetarian meals each week.  Eat more home-cooked food and eat less restaurant, buffet, and fast food. General information  Do not eat more than 2,300 mg of salt (sodium) a day. The amount of sodium that is recommended for you  may be lower, depending on your condition.  Maintain a healthy body weight as directed. Ask your health care provider what a healthy weight is for you. ? Check your weight every day. ? Work with your health care provider and dietitian to make a plan that is right for you to lose weight or maintain your current weight.  Limit how much fluid you drink. Ask your health care provider or dietitian how much fluid you can have each day.  Limit or avoid alcohol as told by  your health care provider or dietitian. Recommended foods The items listed may not be a complete list. Talk with your dietitian about what dietary choices are best for you. Fruits All fresh, frozen, and canned fruits. Dried fruits, such as raisins, prunes, and cranberries. Vegetables All fresh vegetables. Vegetables that are frozen without sauce or added salt. Low-sodium or sodium-free canned vegetables. Grains Bread with less than 80 mg of sodium per slice. Whole-wheat pasta, quinoa, and brown rice. Oats and oatmeal. Barley. [place]. Grits and cream of wheat. Whole-grain and whole-wheat cold cereal. Meats and other protein foods Lean cuts of meat. Skinless chicken and [place]. Fish with high omega-3 fatty acids, such as salmon, sardines, and other cold-water fishes. Eggs. Dried beans, peas, and edamame. Unsalted nuts and nut butters. Dairy Low-fat or nonfat (skim) milk and dried milk. Rice milk, soy milk, and almond milk. Low-fat or nonfat yogurt. Small amounts of reduced-sodium block cheese. Low-sodium cottage cheese. Fats and oils Olive, canola, soybean, flaxseed, or sunflower oil. Avocado. Sweets and desserts Apple sauce. Granola bars. Sugar-free pudding and gelatin. Frozen fruit bars. Seasoning and other foods Fresh and dried herbs. Lemon or lime juice. Vinegar. Low-sodium ketchup. Salt-free marinades, salad dressings, sauces, and seasonings. The items listed above may not be a complete list of foods and beverages you can eat. Contact a dietitian for more information. Foods to avoid The items listed may not be a complete list. Talk with your dietitian about what dietary choices are best for you. Fruits Fruits that are dried with sodium-containing preservatives. Vegetables Canned vegetables. Frozen vegetables with sauce or seasonings. Creamed vegetables. [place] fries. Onion rings. Pickled vegetables and sauerkraut. Grains Bread with more than 80 mg of sodium per slice. Hot or cold cereal  with more than 140 mg sodium per serving. Salted pretzels and crackers. Pre-packaged breadcrumbs. Bagels, croissants, and biscuits. Meats and other protein foods Ribs and chicken wings. Bacon, ham, pepperoni, bologna, salami, and packaged luncheon meats. Hot dogs, bratwurst, and sausage. Canned meat. Smoked meat and fish. Salted nuts and seeds. Dairy Whole milk, half-and-half, and cream. Buttermilk. Processed cheese, cheese spreads, and cheese curds. Regular cottage cheese. Feta cheese. Shredded cheese. String cheese. Fats and oils Butter, lard, shortening, ghee, and bacon fat. Canned and packaged gravies. Seasoning and other foods Onion salt, garlic salt, table salt, and sea salt. Marinades. Regular salad dressings. Relishes, pickles, and olives. Meat flavorings and tenderizers, and bouillon cubes. Horseradish, ketchup, and mustard. Worcestershire sauce. Teriyaki sauce, soy sauce (including reduced sodium). Hot sauce and Tabasco sauce. Steak sauce, fish sauce, oyster sauce, and cocktail sauce. Taco seasonings. Barbecue sauce. Tartar sauce. The items listed above may not be a complete list of foods and beverages you should avoid. Contact a dietitian for more information. Summary  A heart failure eating plan includes changes that limit your intake of sodium and unhealthy fat, and it may help you lose weight or maintain a healthy weight. Your health care provider may also recommend limiting how much  fluid you drink.  Most people with heart failure should eat no more than 2,300 mg of salt (sodium) a day. The amount of sodium that is recommended for you may be lower, depending on your condition.  Contact your health care provider or dietitian before making any major changes to your diet. This information is not intended to replace advice given to you by your health care provider. Make sure you discuss any questions you have with your health care provider. Document Revised: 12/15/2018 Document Reviewed:  03/05/2017 Elsevier Patient Education  2020 [employer].

## 2019-11-13 NOTE — Progress Notes (Signed)
PROGRESS NOTE    Steven Chase  WUJ:[id number] DOB: 1986/05/08 DOA: 11/10/2019 PCP: Corwin Levins, MD    Brief Narrative:  Patient was seen the heart failure clinic where he was noted to have hyperkalemia, AKI, he is referred to the ED  In the ED, patient had a normal-looking EKG but received temporizing measures with calcium gluconate dextrose 50% 50 mL insulin IV and received Lokelma and Kayexalatefor severe hyperkalemia. Nephrology was consulted from the ED. Patient also received 250 mL IV fluid bolus followed by 75 mL/h.  Assessment & Plan:   Principal Problem:   Hyperkalemia Active Problems:   Essential hypertension   OSA (obstructive sleep apnea)   Obesity   CKD (chronic kidney disease), stage II   Chronic systolic heart failure (HCC)   AKI (acute kidney injury) (HCC)  AKI on CKDII / severe hyperkalemia -Likely due to dehydration, hypotension, he denies heavy use of ibuprofen (report only used 2 doses last week) -K greater than 7.5 on presentation , no acute EKG changes, received insulin dextrose IV calcium, Kayexalate in the ED for hyperkalemia, K 5.0 this morning, resolved -Repeat BMP in the morning if here -Appreciate nephrology and cardiology input -Creatinine continues to trend down 2.4 today from 5.44 on admit  Chronic systolic heart failure due to nonischemic cardiomyopathy -EF previosuly 15-20%, - echo 1/8 EF improved to 45% -entresto restarted today, on coreg -No edema, lung clear on exam -cardiology Dr.Bensimhon's input appreciated, will follow recommendation  Anemia, likely component of anemia of chronic disease with with inappropriate reticulocyte response -hgb 10.8 -No sign of blood loss - FOBT pending collection  HTN -On Coreg, hydralazine, Imdur, Entresto, spironolactone, Demadex at home,  -all has been held on admission due to borderline blood pressure (90/52) -sbp in 150s/110s this morning, cardiology increased imdur, added entresto back,  increased hydralazine to 75 mg TID -nurse called low BP around 1330 90/60 and about to give 75 mg hydralazine dosing first time, changed back to 50 mg TID, leave entresto and imdur same dosing -Monitor blood pressure, continue titrate blood pressure medication if still inpatient  Obesity/OSA continue nightly CPAP He refused CPAP in the hospital, not sure CPAP compliance at home Body mass index is 38.12 kg/m.   Dyslipidemia/severe hypertriglyceridemia -Dr Gala Romney recommendation appreciated -On crestor, fenofibrate, vascepa - very lowfat diet - If develops signs of hyperviscosity can consider plasmapheresis "  Left knee pain -Left knee x-ray obtained on January 6 showed "small suprapatellar joint effusion. No acute bony abnormality" -Denies knee pain this morning, knee joint exam benign, nontender, not warm to touch, no edema, range of motion intact  Right elbow pain -Overall resolved with prednisone -Plan for 5 day course prednisone on d/c  DVT prophylaxis: Heparin Code Status: Full Family Communication: patient only Disposition Plan: pending cardiology, expected today or tomorrow  Consultants:   Heart failure  Subjective: Feeling well, wants to go home. Elbows no longer hurting. Denies chest pains, SOB, abdominal pain. Denies diarrhea or constipation. Denies concerns.   Objective: Vitals:   11/12/19 1835 11/12/19 1929 11/13/19 0502 11/13/19 0803  BP:  127/86 (!) 158/112 (!) 163/129  Pulse:  (!) 106 (!) 116 (!) 119  Resp: 20 19 20    Temp:  97.9 F (36.6 C) 98 F (36.7 C)   TempSrc:  Oral Oral   SpO2:  97% 98%   Weight:   107.4 kg   Height:        Intake/Output Summary (Last 24 hours) at 11/13/2019 1331 Last  data filed at 11/13/2019 1125 Gross per 24 hour  Intake 620 ml  Output 525 ml  Net 95 ml   Filed Weights   11/11/19 0240 11/12/19 0500 11/13/19 0502  Weight: 108.1 kg 107.1 kg 107.4 kg    Examination:  General exam: Appears calm and comfortable    Respiratory system: Clear to auscultation. Respiratory effort normal. Cardiovascular system: S1 & S2 heard, RRR. No JVD, murmurs, rubs, gallops or clicks. No pedal edema. Gastrointestinal system: Abdomen is nondistended, soft and nontender. No organomegaly or masses felt. Normal bowel sounds heard. Central nervous system: Alert and oriented. No focal neurological deficits. Extremities: Symmetric 5 x 5 power. Skin: No rashes, lesions or ulcers Psychiatry: Judgement and insight appear normal. Mood & affect appropriate.     Data Reviewed: I have personally reviewed following labs and imaging studies  CBC: Recent Labs  Lab 11/10/19 1342 11/10/19 2005 11/11/19 0240 11/12/19 0100 11/13/19 0350  WBC 9.5 12.8* 10.1 11.0* 11.7*  NEUTROABS  --   --   --  7.5 7.1  HGB 10.9* 11.0* 10.8* 10.8* 10.4*  HCT 33.7* 33.6* 32.4* 31.9* 30.2*  MCV 82.4 81.0 79.8* 79.2* 78.2*  PLT 274 292 271 291 271   Basic Metabolic Panel: Recent Labs  Lab 11/10/19 1342 11/10/19 2005 11/11/19 0240 11/11/19 1102 11/12/19 0100 11/13/19 0350  NA 135  --  134* 133* 136 135  K >7.5* 6.2* 5.4* 5.1  5.0 4.4 4.6  CL 107  --  107 108 107 106  CO2 17*  --  16* 19* 17* 19*  GLUCOSE 102*  --  95 102* 122* 107*  BUN 95*  --  83* 70* 57* 40*  CREATININE 6.06* 5.44* 4.39* 3.53* 2.71* 2.40*  CALCIUM 9.7  --  9.3 9.1 9.2 9.2  PHOS  --   --  5.5*  --   --   --    GFR: Estimated Creatinine Clearance: 49.8 mL/min (A) (by C-G formula based on SCr of 2.4 mg/dL (H)). Liver Function Tests: Recent Labs  Lab 11/10/19 1342 11/11/19 0240  AST 18 23  ALT 18 23  ALKPHOS 43 41  BILITOT 0.4 0.5  PROT 8.1 7.9  ALBUMIN 4.3 4.2   No results for input(s): LIPASE, AMYLASE in the last 168 hours. No results for input(s): AMMONIA in the last 168 hours. Coagulation Profile: No results for input(s): INR, PROTIME in the last 168 hours. Cardiac Enzymes: No results for input(s): CKTOTAL, CKMB, CKMBINDEX, TROPONINI in the last 168  hours. BNP (last 3 results) No results for input(s): PROBNP in the last 8760 hours. HbA1C: No results for input(s): HGBA1C in the last 72 hours. CBG: Recent Labs  Lab 11/10/19 1526  GLUCAP 129*   Lipid Profile: Recent Labs    11/12/19 0100  CHOL 309*  HDL NOT REPORTED DUE TO HIGH TRIGLYCERIDES  LDLCALC UNABLE TO CALCULATE IF TRIGLYCERIDE OVER 400 mg/dL  TRIG 4,034*  CHOLHDL NOT REPORTED DUE TO HIGH TRIGLYCERIDES  LDLDIRECT 77.6   Thyroid Function Tests: No results for input(s): TSH, T4TOTAL, FREET4, T3FREE, THYROIDAB in the last 72 hours. Anemia Panel: Recent Labs    11/12/19 0100  VITAMINB12 323  FOLATE 10.9  FERRITIN 377*  TIBC 412  IRON 71  RETICCTPCT 1.6   Sepsis Labs: No results for input(s): PROCALCITON, LATICACIDVEN in the last 168 hours.  Recent Results (from the past 240 hour(s))  SARS CORONAVIRUS 2 (TAT 6-24 HRS) Nasopharyngeal Nasopharyngeal Swab     Status: None   Collection  Time: 11/10/19  3:53 PM   Specimen: Nasopharyngeal Swab  Result Value Ref Range Status   SARS Coronavirus 2 NEGATIVE NEGATIVE Final    Comment: (NOTE) SARS-CoV-2 target nucleic acids are NOT DETECTED. The SARS-CoV-2 RNA is generally detectable in upper and lower respiratory specimens during the acute phase of infection. Negative results do not preclude SARS-CoV-2 infection, do not rule out co-infections with other pathogens, and should not be used as the sole basis for treatment or other patient management decisions. Negative results must be combined with clinical observations, patient history, and epidemiological information. The expected result is Negative. Fact Sheet for Patients: SugarRoll.be Fact Sheet for Healthcare Providers: https://www.woods-mathews.com/ This test is not yet approved or cleared by the [place] FDA and  has been authorized for detection and/or diagnosis of SARS-CoV-2 by FDA under an Emergency Use  Authorization (EUA). This EUA will remain  in effect (meaning this test can be used) for the duration of the COVID-19 declaration under Section 56 4(b)(1) of the Act, 21 U.S.C. section 360bbb-3(b)(1), unless the authorization is terminated or revoked sooner. Performed at [facility], [place] [address], [place], [place] [address]          Radiology Studies: No results found.      Scheduled Meds: . carvedilol  12.5 mg Oral BID WC  . colchicine  0.6 mg Oral Daily  . fenofibrate  160 mg Oral Daily  . heparin  5,000 Units Subcutaneous Q8H  . hydrALAZINE  50 mg Oral Q8H  . icosapent Ethyl  2 g Oral BID  . isosorbide mononitrate  60 mg Oral Daily  . predniSONE  20 mg Oral Q breakfast  . rosuvastatin  40 mg Oral q1800  . sacubitril-valsartan  1 tablet Oral BID  . senna-docusate  1 tablet Oral BID   Continuous Infusions:   LOS: 3 days   Time spent: [place], MD [facility] Pager [phone]  If 7PM-7AM, please contact night-coverage [email or web address] Password [email or web address] 11/13/2019, 1:31 PM

## 2019-11-14 ENCOUNTER — Telehealth (HOSPITAL_COMMUNITY): Payer: Self-pay

## 2019-11-14 ENCOUNTER — Other Ambulatory Visit: Payer: Self-pay

## 2019-11-14 ENCOUNTER — Encounter (HOSPITAL_COMMUNITY): Payer: Self-pay | Admitting: Emergency Medicine

## 2019-11-14 ENCOUNTER — Ambulatory Visit (HOSPITAL_COMMUNITY)
Admission: EM | Admit: 2019-11-14 | Discharge: 2019-11-14 | Disposition: A | Discharge status: Home / Self Care | Payer: Self-pay

## 2019-11-14 ENCOUNTER — Other Ambulatory Visit (HOSPITAL_COMMUNITY): Payer: Self-pay | Admitting: Adult Health

## 2019-11-14 NOTE — Progress Notes (Signed)
error Sidnee Gambrill NP-C  9:40 AM

## 2019-11-14 NOTE — ED Triage Notes (Addendum)
Pt R elbow pain and L knee pain since last night after hwas discharged from the hospital. Pt was in the hospital due to high potassium/ high bp, heart failure. Denies dizziness or lightheadedness. Per hospital record, pt had possibly acute gout and was given prednisone.

## 2019-11-14 NOTE — Telephone Encounter (Signed)
Zack of paramedicine in office today to pick up samples of Entresto.    Medication Samples have been provided to the patient.  Drug name: Sherryll Burger       Strength: 24/26mg         Qty: 28  LOT: [id number]  Exp.Date: 7/22  Dosing instructions: 1 tab BID  The patient has been instructed regarding the correct time, dose, and frequency of taking this medication, including desired effects and most common side effects.   Marisa Hua 10:36 AM 11/14/2019

## 2019-11-14 NOTE — ED Provider Notes (Signed)
Patient here for pain.  This was addressed prior to discharge from the hospital yesterday evening.  Was prescribed prednisone daily for the next 3 days. Patient remembered that he actually took this medication this morning. Recommended continue the medication for the next 2 days as prescribed Work note given as requested   Janace Aris, NP 11/14/19 1554

## 2019-11-15 ENCOUNTER — Other Ambulatory Visit (HOSPITAL_COMMUNITY): Payer: Self-pay

## 2019-11-15 MED ORDER — ISOSORBIDE MONONITRATE ER 60 MG PO TB24: 60.0000 mg | ORAL_TABLET | Freq: Every day | ORAL | 3 refills | Status: DC

## 2019-11-15 NOTE — Progress Notes (Signed)
Paramedicine Encounter    Patient ID: Steven Chase, male    DOB: 26-Jun-1986, 34 y.o.   MRN: [id number]   Patient Care Team: Corwin Levins, MD as PCP - General (Internal Medicine) Nahser, Deloris Ping, MD as PCP - Cardiology (Cardiology) Burna Sis, LCSW as Social Worker (Licensed Clinical Social Worker)  Patient Active Problem List   Diagnosis Date Noted  . AKI (acute kidney injury) (HCC) 11/10/2019  . Hyperkalemia 11/10/2019  . Acute exacerbation of CHF (congestive heart failure) (HCC) 07/15/2019  . Lipodystrophy 10/21/2018  . Obesity (BMI 30-39.9) 10/06/2018  . Chronic systolic heart failure (HCC) 02/17/2018  . Fatigue 02/17/2018  . Cardiomyopathy (HCC) 02/11/2018  . Hypertensive crisis 02/04/2018  . Chest pain 02/04/2018  . CKD (chronic kidney disease), stage II 02/04/2018  . Allergic rhinitis 01/21/2018  . Hyperglycemia 12/02/2017  . Hypokalemia 12/02/2017  . Cough 12/02/2017  . Patellar tendonitis of left knee 12/02/2017  . Preventative health care 12/10/2016  . Obesity 05/25/2016  . Lower leg edema 12/12/2014  . Essential hypertension 11/05/2014  . OSA (obstructive sleep apnea) 11/05/2014    Current Outpatient Medications:  .  carvedilol (COREG) 12.5 MG tablet, Take 1 tablet (12.5 mg total) by mouth 2 (two) times daily with a meal., Disp: 60 tablet, Rfl: 0 .  fenofibrate 160 MG tablet, Take 1 tablet (160 mg total) by mouth daily., Disp: 30 tablet, Rfl: 0 .  hydrALAZINE (APRESOLINE) 50 MG tablet, Take 1 tablet (50 mg total) by mouth every 8 (eight) hours., Disp: 90 tablet, Rfl: 0 .  icosapent Ethyl (VASCEPA) 1 g capsule, Take 2 capsules (2 g total) by mouth 2 (two) times daily., Disp: 120 capsule, Rfl: 0 .  isosorbide mononitrate (IMDUR) 60 MG 24 hr tablet, Take 1 tablet (60 mg total) by mouth daily., Disp: 30 tablet, Rfl: 3 .  predniSONE (DELTASONE) 20 MG tablet, Take 1 tablet (20 mg total) by mouth daily with breakfast for 3 days., Disp: 3 tablet, Rfl: 0 .   rosuvastatin (CRESTOR) 40 MG tablet, Take 1 tablet (40 mg total) by mouth daily at 6 PM., Disp: 90 tablet, Rfl: 0 .  sacubitril-valsartan (ENTRESTO) 24-26 MG, Take 1 tablet by mouth 2 (two) times daily., Disp: 60 tablet, Rfl: 0 No Known Allergies    Social History   Socioeconomic History  . Marital status: Single    Spouse name: Not on file  . Number of children: 1  . Years of education: 42  . Highest education level: Not on file  Occupational History  . 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 Smoking status: Never Smoker  . Smokeless tobacco: Never Used  Substance and Sexual Activity  . Alcohol use: No  . Drug use: No  . Sexual activity: Not on file  Other Topics Concern  . Not on file  Social History Narrative   Born and raised in [place], [place]. Currently live in a private residence with his mother. Fun: Play with his daughter.   Denies religious beliefs that would effect health care.    Social Determinants of Health   Financial Resource Strain: Low Risk   . Difficulty of Paying Living Expenses: Not very hard  Food Insecurity: Food Insecurity Present  . Worried About Programme researcher, broadcasting/film/video in the Last Year: Sometimes true  . Ran Out of Food in the Last Year: Sometimes true  Transportation Needs: No Transportation Needs  . Lack of Transportation (Medical): No  .  Lack of Transportation (Non-Medical): No  Physical Activity:   . Days of Exercise per Week: Not on file  . Minutes of Exercise per Session: Not on file  Stress:   . Feeling of Stress : Not on file  Social Connections:   . Frequency of Communication with Friends and Family: Not on file  . Frequency of Social Gatherings with Friends and Family: Not on file  . Attends Religious Services: Not on file  . Active Member of Clubs or Organizations: Not on file  . Attends Archivist Meetings: Not on file  . Marital Status: Not on file  Intimate Partner Violence:   . Fear of  Current or Ex-Partner: Not on file  . Emotionally Abused: Not on file  . Physically Abused: Not on file  . Sexually Abused: Not on file    Physical Exam      Future Appointments  Date Time Provider [place]  11/21/2019 11:00 AM [facility] PA/NP [facility] None  03/11/2020  2:00 PM Bensimhon, Shaune Pascal, MD [facility] None  04/10/2020  3:00 PM Biagio Borg, MD [facility] None    There were no vitals taken for this visit.  Mr Sullenger was seen at home today to deliver medications and reset his pillbox following a hospitalization. He reported feeling generally well with his only complaint being knee pain secondary to gout. Upon reconciling his medications I noted that he did not have the new doses of Entresto or Vascepa. I contacted the pharmacy and they should be filling those medications promptly. He was provided with a sample bottle of Entresto for the mean time. I will follow up next week.  Jacquiline Doe, EMT 11/15/19  ACTION: Home visit completed Next visit planned for 1 week

## 2019-11-16 ENCOUNTER — Telehealth (HOSPITAL_COMMUNITY): Payer: Self-pay | Admitting: Cardiology

## 2019-11-16 MED ORDER — TORSEMIDE 20 MG PO TABS: 20.0000 mg | ORAL_TABLET | Freq: Every day | ORAL | 3 refills | Status: DC

## 2019-11-16 NOTE — Telephone Encounter (Signed)
Medication reconciliation complete  Torsemide is not on his list  Med added

## 2019-11-21 ENCOUNTER — Ambulatory Visit (HOSPITAL_COMMUNITY)
Admit: 2019-11-21 | Discharge: 2019-11-21 | Disposition: A | Discharge status: Home / Self Care | Payer: 59 | Source: Ambulatory Visit | Attending: Cardiology | Admitting: Cardiology

## 2019-11-21 ENCOUNTER — Other Ambulatory Visit: Payer: Self-pay

## 2019-11-21 ENCOUNTER — Other Ambulatory Visit (HOSPITAL_COMMUNITY): Payer: Self-pay

## 2019-11-21 ENCOUNTER — Telehealth (HOSPITAL_COMMUNITY): Payer: Self-pay

## 2019-11-21 ENCOUNTER — Encounter (HOSPITAL_COMMUNITY): Payer: Self-pay

## 2019-11-21 VITALS — BP 124/92 | HR 95 | Wt 240.8 lb

## 2019-11-21 DIAGNOSIS — I5022 Chronic systolic (congestive) heart failure: Secondary | ICD-10-CM | POA: Diagnosis present

## 2019-11-21 DIAGNOSIS — E669 Obesity, unspecified: Secondary | ICD-10-CM | POA: Insufficient documentation

## 2019-11-21 DIAGNOSIS — G4733 Obstructive sleep apnea (adult) (pediatric): Secondary | ICD-10-CM | POA: Insufficient documentation

## 2019-11-21 DIAGNOSIS — I428 Other cardiomyopathies: Secondary | ICD-10-CM | POA: Diagnosis not present

## 2019-11-21 DIAGNOSIS — D509 Iron deficiency anemia, unspecified: Secondary | ICD-10-CM | POA: Diagnosis not present

## 2019-11-21 DIAGNOSIS — E781 Pure hyperglyceridemia: Secondary | ICD-10-CM | POA: Diagnosis not present

## 2019-11-21 DIAGNOSIS — I130 Hypertensive heart and chronic kidney disease with heart failure and stage 1 through stage 4 chronic kidney disease, or unspecified chronic kidney disease: Secondary | ICD-10-CM | POA: Diagnosis not present

## 2019-11-21 DIAGNOSIS — N179 Acute kidney failure, unspecified: Secondary | ICD-10-CM | POA: Insufficient documentation

## 2019-11-21 DIAGNOSIS — Z794 Long term (current) use of insulin: Secondary | ICD-10-CM | POA: Diagnosis not present

## 2019-11-21 DIAGNOSIS — Z79899 Other long term (current) drug therapy: Secondary | ICD-10-CM | POA: Insufficient documentation

## 2019-11-21 DIAGNOSIS — N1831 Chronic kidney disease, stage 3a: Secondary | ICD-10-CM | POA: Diagnosis not present

## 2019-11-21 DIAGNOSIS — Z8249 Family history of ischemic heart disease and other diseases of the circulatory system: Secondary | ICD-10-CM | POA: Insufficient documentation

## 2019-11-21 DIAGNOSIS — E875 Hyperkalemia: Secondary | ICD-10-CM | POA: Diagnosis not present

## 2019-11-21 LAB — BASIC METABOLIC PANEL
Anion gap: 12 (ref 5–15)
BUN: 32 mg/dL — ABNORMAL HIGH (ref 6–20)
CO2: 28 mmol/L (ref 22–32)
Calcium: 9.9 mg/dL (ref 8.9–10.3)
Chloride: 100 mmol/L (ref 98–111)
Creatinine, Ser: 1.86 mg/dL — ABNORMAL HIGH (ref 0.61–1.24)
GFR calc Af Amer: 54 mL/min — ABNORMAL LOW (ref >60)
GFR calc non Af Amer: 46 mL/min — ABNORMAL LOW (ref >60)
Glucose, Bld: 141 mg/dL — ABNORMAL HIGH (ref 70–99)
Potassium: 4 mmol/L (ref 3.5–5.1)
Sodium: 140 mmol/L (ref 135–145)

## 2019-11-21 MED ORDER — SPIRONOLACTONE 25 MG PO TABS: 12.5000 mg | ORAL_TABLET | Freq: Every day | ORAL | 3 refills | Status: DC

## 2019-11-21 NOTE — Addendum Note (Signed)
Addended by: Red Christians A on: 11/21/2019 03:25 PM   Modules accepted: Orders

## 2019-11-21 NOTE — Progress Notes (Signed)
Paramedicine Encounter   Patient ID: Steven Chase , male,   DOB: 17-Jan-1986,33 y.o.,  MRN: [id number]  Mr [name] was seen in the clinic today with [name], PA-C, and reported feeling well. Per [name], we are restarting his spironolactone at 12.5 mg daily. I will follow up tomorrow to refill his pillbox was the necessary changes.   [name], EMT 11/21/2019   ACTION: Next visit planned for Tomorrow

## 2019-11-21 NOTE — Patient Instructions (Signed)
It was great to see you today! No medication changes are needed at this time.  Labs today We will only contact you if something comes back abnormal or we need to make some changes. Otherwise no news is good news!  Your physician recommends that you schedule a follow-up appointment in: 3 weeks with the CHF pharmacy team or  in the Advanced Practitioners (PA/NP) Clinic   Do the following things EVERYDAY: 1) Weigh yourself in the morning before breakfast. Write it down and keep it in a log. 2) Take your medicines as prescribed 3) Eat low salt foods--Limit salt (sodium) to 2000 mg per day.  4) Stay as active as you can everyday 5) Limit all fluids for the day to less than 2 liters  At the [facility], you and your health needs are our priority. As part of our continuing mission to provide you with exceptional heart care, we have created designated Provider Care Teams. These Care Teams include your primary Cardiologist (physician) and Advanced Practice Providers (APPs- Physician Assistants and Nurse Practitioners) who all work together to provide you with the care you need, when you need it.   You may see any of the following providers on your designated Care Team at your next follow up: Marland Kitchen Dr Arvilla Meres . Dr Marca Ancona . Tonye Becket, NP . [name], PA . Karle Plumber, PharmD   Please be sure to bring in all your medications bottles to every appointment.

## 2019-11-21 NOTE — Telephone Encounter (Signed)
[name] EMT-P to relay med changes. Steven Chase will have the pt to call the clinic to set up a lab appt for 1 week as the patient is difficult to reach. Orders placed.

## 2019-11-21 NOTE — Progress Notes (Signed)
Advanced [facility] Clinic Note   Referring Physician: PCP: Corwin Levins, MD PCP-Cardiologist: Kristeen Miss, MD  [facility]: Dr. Gala Romney   HPI: Steven Chase is a 34 y.o. male with chronic NICM(prior EF 15-20% in 07/2018),poorly controlledHTN, OSA, and obesity.  Admitted with CP and ADHF in setting of uncontrolled HTN/HTN crisis in 9/20.Prior to admit he had run out of a few of his HF medications. Diuresed with IV lasix + metolazone and later transitioned to torsemide 60 mg daily. Overall weight went down 13 pounds. HF meds adjusted with improvement in blood pressure. Entresto increased to 97-103 twice a day. Discharge weight was 242 pounds.   He has required recent dose adjustments to [facility]. Did not tolerate 97-103 and was changed to 49-51 at last clinic visit on 11/10/19. Echo was also repeated at that visit and EF was improved at 45-50%.  Digoxin was discontinued. Labs were also obtained and showed AKI w/ SCr at 6.0 and hyperkalemia w/ K at 7.5. He was sent to ED and admitted for further management. EKG was normal. This was in the setting of dehydration + frequent use of NSAIDs. His HF meds were held and he as given IVFs for hydration and hyperkalemia treated w/ insulin, dextrose, IV calcium gluconate, kayexelate + lokelma.  Hypokalemia resolved and AKI improved. SCr drifted down to 2.40 by day of d/c. K was 4.6.   His HF meds were resumed, except for spironolactone. Entresto was resumed but at low dose 24-26 bid. Also of note, he was found to have severe hypertriglyceridemia w/ TGs 1300. Direct LDL 77. He was treated w/ Crestor and Vascepa added, however currently waiting approval from insurance for Vascepa.   He presents to clinic today for f/u. Here w/ para medicine. Doing well post discharge. Avoiding NSAIDs. Compliant w/ HF meds. No resting dyspnea. Stable NYHA Class II.  Wt 240 lb today (dry wt ~235-238 lb). BP stable 124/92.    Echo 1/21 1. Left ventricular ejection  fraction, by visual estimation, is 40 to 45%. The left ventricle has moderately decreased function. There is severely increased left ventricular hypertrophy. 2. Left ventricular diastolic parameters are consistent with Grade I diastolic dysfunction (impaired relaxation). 3. The left ventricle demonstrates regional wall motion abnormalities. 4. Poor acoustic windows limit study LVEF appears improved compared to report from September 2020. 5. Global right ventricle has moderately reduced systolic function.The right ventricular size is normal. No increase in right ventricular wall thickness. 6. Left atrial size was normal. 7. Right atrial size was normal. 8. The mitral valve is normal in structure. No evidence of mitral valve regurgitation. 9. The tricuspid valve is normal in structure. 10. The aortic valve is tricuspid. Aortic valve regurgitation is not visualized. Mild aortic valve sclerosis without stenosis. 11. The pulmonic valve was normal in structure. Pulmonic valve regurgitation is not visualized. 12. The inferior vena cava is normal in size with greater than 50% respiratory variability, suggesting right atrial pressure of 3 mmHg. 13. Poor acoustic windows limit study.      Review of Systems: [y] = yes, [ ]  = no   General: Weight gain [ ] ; Weight loss [ ] ; Anorexia [ ] ; Fatigue [ ] ; Fever [ ] ; Chills [ ] ; Weakness [ ]   Cardiac: Chest pain/pressure [ ] ; Resting SOB [ ] ; Exertional SOB [ ] ; Orthopnea [ ] ; Pedal Edema [ ] ; Palpitations [ ] ; Syncope [ ] ; Presyncope [ ] ; Paroxysmal nocturnal dyspnea[ ]   Pulmonary: Cough [ ] ; Wheezing[ ] ; Hemoptysis[ ] ; Sputum [ ] ;  Snoring [ ]   GI: Vomiting[ ] ; Dysphagia[ ] ; Melena[ ] ; Hematochezia [ ] ; Heartburn[ ] ; Abdominal pain [ ] ; Constipation [ ] ; Diarrhea [ ] ; BRBPR [ ]   GU: Hematuria[ ] ; Dysuria [ ] ; Nocturia[ ]   Vascular: Pain in legs with walking [ ] ; Pain in feet with lying flat [ ] ; Non-healing sores [ ] ; Stroke [ ] ; TIA [ ] ; Slurred speech  [ ] ;  Neuro: Headaches[ ] ; Vertigo[ ] ; Seizures[ ] ; Paresthesias[ ] ;Blurred vision [ ] ; Diplopia [ ] ; Vision changes [ ]   Ortho/Skin: Arthritis [ ] ; Joint pain [ ] ; Muscle pain [ ] ; Joint swelling [ ] ; Back Pain [ ] ; Rash [ ]   Psych: Depression[ ] ; Anxiety[ ]   Heme: Bleeding problems [ ] ; Clotting disorders [ ] ; Anemia [ ]   Endocrine: Diabetes [ ] ; Thyroid dysfunction[ ]    Past Medical History:  Diagnosis Date  . CHF (congestive heart failure) ([place])   . Hypertension   . Obesity   . OSA (obstructive sleep apnea) 11/05/2014   02/2015 -severe -AHI 106/h     Current Outpatient Medications  Medication Sig Dispense Refill  . carvedilol (COREG) 12.5 MG tablet Take 1 tablet (12.5 mg total) by mouth 2 (two) times daily with a meal. 60 tablet 0  . fenofibrate 160 MG tablet Take 1 tablet (160 mg total) by mouth daily. 30 tablet 0  . hydrALAZINE (APRESOLINE) 50 MG tablet Take 1 tablet (50 mg total) by mouth every 8 (eight) hours. 90 tablet 0  . sacubitril-valsartan (ENTRESTO) 24-26 MG Take 1 tablet by mouth 2 (two) times daily. 60 tablet 0  . torsemide (DEMADEX) 20 MG tablet Take 1 tablet (20 mg total) by mouth daily. 90 tablet 3  . icosapent Ethyl (VASCEPA) 1 g capsule Take 2 capsules (2 g total) by mouth 2 (two) times daily. (Patient not taking: Reported on 11/15/2019) 120 capsule 0  . isosorbide mononitrate (IMDUR) 60 MG 24 hr tablet Take 1 tablet (60 mg total) by mouth daily. (Patient not taking: Reported on 11/21/2019) 30 tablet 3  . rosuvastatin (CRESTOR) 40 MG tablet Take 1 tablet (40 mg total) by mouth daily at 6 PM. (Patient not taking: Reported on 11/15/2019) 90 tablet 0   No current facility-administered medications for this encounter.    No Known Allergies    Social History   Socioeconomic History  . Marital status: Single    Spouse name: Not on file  . Number of children: 1  . Years of education: 40  . Highest education level: Not on file  Occupational History  . Occupation:  Research scientist (physical sciences): [employer]  Tobacco Use  . Smoking status: Never Smoker  . Smokeless tobacco: Never Used  Substance and Sexual Activity  . Alcohol use: No  . Drug use: No  . Sexual activity: Not on file  Other Topics Concern  . Not on file  Social History Narrative   Born and raised in [place], [place]. Currently live in a private residence with his mother. Fun: Play with his daughter.   Denies religious beliefs that would effect health care.    Social Determinants of Health   Financial Resource Strain: Low Risk   . Difficulty of Paying Living Expenses: Not very hard  Food Insecurity: Food Insecurity Present  . Worried About Charity fundraiser in the Last Year: Sometimes true  . Ran Out of Food in the Last Year: Sometimes true  Transportation Needs: No Transportation Needs  . Lack of Transportation (Medical):  No  . Lack of Transportation (Non-Medical): No  Physical Activity:   . Days of Exercise per Week: Not on file  . Minutes of Exercise per Session: Not on file  Stress:   . Feeling of Stress : Not on file  Social Connections:   . Frequency of Communication with Friends and Family: Not on file  . Frequency of Social Gatherings with Friends and Family: Not on file  . Attends Religious Services: Not on file  . Active Member of Clubs or Organizations: Not on file  . Attends Banker Meetings: Not on file  . Marital Status: Not on file  Intimate Partner Violence:   . Fear of Current or Ex-Partner: Not on file  . Emotionally Abused: Not on file  . Physically Abused: Not on file  . Sexually Abused: Not on file      Family History  Problem Relation Age of Onset  . Hypertension Mother   . Heart disease Maternal Grandfather   . Hypertension Maternal Grandfather   . Hypertension Sister     Vitals:   11/21/19 1046  BP: (!) 124/92  Pulse: 95  SpO2: 96%  Weight: 109.2 kg (240 lb 12.8 oz)     PHYSICAL EXAM: General:  Well appearing. No  respiratory difficulty HEENT: normal Neck: supple. no JVD. Carotids 2+ bilat; no bruits. No lymphadenopathy or thyromegaly appreciated. Cor: PMI nondisplaced. Regular rate & rhythm. No rubs, gallops or murmurs. Lungs: clear Abdomen: soft, nontender, nondistended. No hepatosplenomegaly. No bruits or masses. Good bowel sounds. Extremities: no cyanosis, clubbing, rash, edema Neuro: alert & oriented x 3, cranial nerves grossly intact. moves all 4 extremities w/o difficulty. Affect pleasant.  ECG: not performed    ASSESSMENT & PLAN:  1. AKI on CKD3a - due to volume depletion and NSAIDs. Recent admit SCr 6.0 - improved with IVFs and temporary hold of diuretics/HF meds.  - SCr 2.40 day of d/c (baseline 1.5-1.9) - low dose Entresto + torsemide resumed  - repeat BMP today   2. Chronic systolic HF due to NICM (likely hypertensive) - EF previosuly 15-20% - echo 11/10/19 EF improved to 45% - NYHA Class II. Volume status stable but Wt up ~ 3-4 lb from dry wt - Continue carvedilol 12.5 mg twice a day  - Continue Entresto 24-26 bid  - Continue hydralazine 50 mg tid - Continue Imdur 60 mg daily.  - Check BMP today. If SCr returned to baseline and if K ok, will add back spironolactone 12.5 mg daily   3. HTN -Controlled on current regimen - See above.   4. Microcytic anemia - Iron stores ok. Checked during recent hospitalization    5. Severe hypertriglyceridemia - TGs 1,100-> 1,300 - Continue Crestor - Waiting on insurance approval for Vascepa. Unable to afford fenofibrate (per pt and paramedicine) - Encouraged  lowfat diet - If develops signs of hyperviscosity (AMS, SOB, visual changes) can consider plasmapheresis   6. Hyperkalemia - repeat BMP today   7. OSA - Using CPAP qhs.   F/u in 2-3 weeks for further titration of HF regimen, if renal function and K allows   [name], PA-C 11/21/19

## 2019-11-21 NOTE — Telephone Encounter (Signed)
COVID-19 pre-appointment screening questions:   Do you have a history of COVID-19 or a positive test result in the past 7-10 days?  To the best of your knowledge, have you been in close contact with anyone with a confirmed diagnosis of COVID 19?  Have you had any one or more of the following: Fever, chills, cough, shortness of breath (out of the normal for you) or any flu-like symptoms?  Are you experiencing any of the following symptoms that is new or out of usual for you:  . Ear, nose or throat discomfort . Sore throat . Headache . Muscle Pain . Diarrhea . Loss of taste or smell   Reviewed all the following with patient: . Use of hand sanitizer when entering the building . Everyone is required to wear a mask in the building, if you do not have a mask we are happy to provide you with one when you arrive . NO Visitor guidelines   If patient answers YES to any of questions they must change to a virtual visit and place note in comments about symptoms   NO ANSWER

## 2019-11-21 NOTE — Telephone Encounter (Signed)
-----   Message from Allayne Butcher, [place] sent at 11/21/2019  3:10 PM EST ----- SCr has returned to his baseline. K level normal. Restart low dose spironolactone 12.5 mg once daily and repeat BMP in 1 week.

## 2019-11-22 ENCOUNTER — Other Ambulatory Visit (HOSPITAL_COMMUNITY): Payer: Self-pay

## 2019-11-22 NOTE — Progress Notes (Signed)
Mr Rosenzweig was seen at home today and reported feeling well. The purpose of this visit was to refill his pillbox in accordance with his lab findings from yesterday. His medications were verified and his pillbox was refilled. I also registered him for a Vascepa discount card and gave to him to take to the pharmacy when he gets his insurance card. I will follow up next week.   [name], EMT 11/22/19

## 2019-11-23 ENCOUNTER — Telehealth (HOSPITAL_COMMUNITY): Payer: Self-pay | Admitting: Pharmacist

## 2019-11-23 MED ORDER — ICOSAPENT ETHYL 1 G PO CAPS: 2.0000 g | ORAL_CAPSULE | Freq: Two times a day (BID) | ORAL | 11 refills | Status: DC

## 2019-11-23 NOTE — Telephone Encounter (Signed)
Sent in [place] prescription to [facility].

## 2019-11-26 ENCOUNTER — Other Ambulatory Visit: Payer: Self-pay

## 2019-11-26 ENCOUNTER — Ambulatory Visit (HOSPITAL_COMMUNITY)
Admission: EM | Admit: 2019-11-26 | Discharge: 2019-11-26 | Disposition: A | Discharge status: Home / Self Care | Payer: 59 | Attending: Internal Medicine | Admitting: Internal Medicine

## 2019-11-26 ENCOUNTER — Encounter (HOSPITAL_COMMUNITY): Payer: Self-pay

## 2019-11-26 DIAGNOSIS — M109 Gout, unspecified: Secondary | ICD-10-CM

## 2019-11-26 MED ORDER — ALLOPURINOL 100 MG PO TABS: 50.0000 mg | ORAL_TABLET | Freq: Every day | ORAL | 0 refills | Status: DC

## 2019-11-26 MED ORDER — PREDNISONE 20 MG PO TABS: 20.0000 mg | ORAL_TABLET | Freq: Every day | ORAL | 0 refills | Status: AC

## 2019-11-26 NOTE — ED Triage Notes (Signed)
Pt c/o RLE pain; states awoke yesterday to pain. Denies trauma or injury to area.  Pt wrapped right foot and ankle tightly with sports tape. Rt foot slightly edematous and warm. Positive sensation to foot/toes.

## 2019-11-26 NOTE — ED Provider Notes (Signed)
[facility]    CSN: [id number] Arrival date & time: 11/26/19  1223      History   Chief Complaint Chief Complaint  Patient presents with  . Leg Pain    HPI Steven Chase is a 34 y.o. male with history of hypertension, congestive heart failure on diuretics comes to urgent care with sudden onset right toe pain of 1 day duration.  Patient denies any trauma.  Pain is currently 8 out of 10, constant and throbbing, aggravated by applying pressure on moving toes, no known relieving factors and not associated with any numbness or tingling.  No swelling of the toe.  No fever or chills.  No color changes.  No radiation of pain.  Patient was recently discharged from the hospital and during the hospital stay he was diagnosed with gout involving the right elbow.  At that time he was hyperuricemic with uric acid level 14.0.  He was treated with steroids.  Allopurinol was not started at that time.Marland Kitchen   HPI  Past Medical History:  Diagnosis Date  . CHF (congestive heart failure) ([facility])   . Hypertension   . Obesity   . OSA (obstructive sleep apnea) 11/05/2014   02/2015 -severe -AHI 106/h     Patient Active Problem List   Diagnosis Date Noted  . AKI (acute kidney injury) ([facility]) 11/10/2019  . Hyperkalemia 11/10/2019  . Acute exacerbation of CHF (congestive heart failure) (H[facility]) 07/15/2019  . Lipodystrophy 10/21/2018  . Obesity (BMI 30-39.9) 10/06/2018  . Chronic systolic heart failure ([facility]) 02/17/2018  . Fatigue 02/17/2018  . Cardiomyopathy ([facility]) 02/11/2018  . Hypertensive crisis 02/04/2018  . Chest pain 02/04/2018  . CKD (chronic kidney disease), stage II 02/04/2018  . Allergic rhinitis 01/21/2018  . Hyperglycemia 12/02/2017  . Hypokalemia 12/02/2017  . Cough 12/02/2017  . Patellar tendonitis of left knee 12/02/2017  . Preventative health care 12/10/2016  . Obesity 05/25/2016  . Lower leg edema 12/12/2014  . Essential hypertension 11/05/2014  . OSA (obstructive sleep apnea)  11/05/2014    Past Surgical History:  Procedure Laterality Date  . HERNIA REPAIR    . RIGHT/LEFT HEART CATH AND CORONARY ANGIOGRAPHY N/A 02/07/2018   Procedure: RIGHT/LEFT HEART CATH AND CORONARY ANGIOGRAPHY;  Surgeon: Marykay Lex, MD;  Location: [facility] INVASIVE CV LAB;  Service: Cardiovascular;  Laterality: N/A;       Home Medications    Prior to Admission medications   Medication Sig Start Date End Date Taking? Authorizing Provider  carvedilol (COREG) 12.5 MG tablet Take 1 tablet (12.5 mg total) by mouth 2 (two) times daily with a meal. 11/13/19   Myrlene Broker, MD  fenofibrate 160 MG tablet Take 1 tablet (160 mg total) by mouth daily. 11/14/19   Myrlene Broker, MD  hydrALAZINE (APRESOLINE) 50 MG tablet Take 1 tablet (50 mg total) by mouth every 8 (eight) hours. 11/13/19   Myrlene Broker, MD  icosapent Ethyl (VASCEPA) 1 g capsule Take 2 capsules (2 g total) by mouth 2 (two) times daily. 11/23/19   Bensimhon, Bevelyn Buckles, MD  isosorbide mononitrate (IMDUR) 60 MG 24 hr tablet Take 1 tablet (60 mg total) by mouth daily. Patient not taking: Reported on 11/21/2019 11/15/19   Bensimhon, Bevelyn Buckles, MD  rosuvastatin (CRESTOR) 40 MG tablet Take 1 tablet (40 mg total) by mouth daily at 6 PM. Patient not taking: Reported on 11/15/2019 11/13/19   Myrlene Broker, MD  sacubitril-valsartan (ENTRESTO) 24-26 MG Take 1 tablet by mouth 2 (  two) times daily. 11/13/19   Hoyt Koch, MD  spironolactone (ALDACTONE) 25 MG tablet Take 0.5 tablets (12.5 mg total) by mouth daily. 11/21/19 02/19/20  Lyda Jester M, PA-C  torsemide (DEMADEX) 20 MG tablet Take 1 tablet (20 mg total) by mouth daily. 11/16/19 02/14/20  Conrad Malone, NP    Family History Family History  Problem Relation Age of Onset  . Hypertension Mother   . Heart disease Maternal Grandfather   . Hypertension Maternal Grandfather   . Hypertension Sister     Social History Social History   Tobacco Use  . Smoking  status: Never Smoker  . Smokeless tobacco: Never Used  Substance Use Topics  . Alcohol use: No  . Drug use: No     Allergies   Patient has no known allergies.   Review of Systems Review of Systems  Constitutional: Negative for activity change, chills and fever.  HENT: Negative.   Respiratory: Negative for cough and shortness of breath.   Cardiovascular: Negative for chest pain and palpitations.  Gastrointestinal: Negative for diarrhea, nausea and vomiting.  Genitourinary: Negative for dysuria, hematuria and urgency.  Musculoskeletal: Positive for arthralgias. Negative for joint swelling, neck pain and neck stiffness.  Skin: Negative.   Neurological: Negative for dizziness, light-headedness and headaches.     Physical Exam Triage Vital Signs ED Triage Vitals [11/26/19 1243]  Enc Vitals Group     BP (!) 151/82     Pulse Rate 100     Resp 18     Temp 98.2 F (36.8 C)     Temp Source Oral     SpO2 97 %     Weight      Height      Head Circumference      Peak Flow      Pain Score 8     Pain Loc      Pain Edu?      Excl. in [place]?    No data found.  Updated Vital Signs BP (!) 151/82 (BP Location: Right Arm)   Pulse 100   Temp 98.2 F (36.8 C) (Oral)   Resp 18   SpO2 97%   Visual Acuity Right Eye Distance:   Left Eye Distance:   Bilateral Distance:    Right Eye Near:   Left Eye Near:    Bilateral Near:     Physical Exam Constitutional:      General: He is in acute distress.     Appearance: He is not ill-appearing.  Cardiovascular:     Rate and Rhythm: Normal rate and regular rhythm.     Pulses: Normal pulses.     Heart sounds: Normal heart sounds. No murmur.  Pulmonary:     Effort: Pulmonary effort is normal. No respiratory distress.     Breath sounds: No stridor.  Abdominal:     General: Bowel sounds are normal. There is no distension.     Palpations: There is no mass.  Musculoskeletal:     Cervical back: Normal range of motion. No rigidity.      Comments: Tenderness on palpation of the second toe of the right foot.  No swelling or erythema.  No tenderness of the great toe of the right foot.  Lymphadenopathy:     Cervical: No cervical adenopathy.  Skin:    General: Skin is warm.     Capillary Refill: Capillary refill takes less than 2 seconds.     Findings: No bruising or erythema.  Neurological:  General: No focal deficit present.     Mental Status: He is alert and oriented to person, place, and time.      UC Treatments / Results  Labs (all labs ordered are listed, but only abnormal results are displayed) Labs Reviewed - No data to display  EKG   Radiology No results found.  Procedures Procedures (including critical care time)  Medications Ordered in UC Medications - No data to display  Initial Impression / Assessment and Plan / UC Course  I have reviewed the triage vital signs and the nursing notes.  Pertinent labs & imaging results that were available during my care of the patient were reviewed by me and considered in my medical decision making (see chart for details).     1.  Acute gout flare involving the second toe of the right foot: Prednisone 20 mg orally 3 days Patient was given a prescription of allopurinol to start after the acute flare subsides Hyperuricemia with uric acid level of 14.0 If patient symptoms worsens he is advised to return to urgent care to be reevaluated Final Clinical Impressions(s) / UC Diagnoses   Final diagnoses:  None   Discharge Instructions   None    ED Prescriptions    None     PDMP not reviewed this encounter.   Merrilee Jansky, MD 11/26/19 [phone]

## 2019-11-28 ENCOUNTER — Other Ambulatory Visit (HOSPITAL_COMMUNITY): Payer: Self-pay

## 2019-11-30 ENCOUNTER — Other Ambulatory Visit (HOSPITAL_COMMUNITY): Payer: Self-pay

## 2019-11-30 ENCOUNTER — Telehealth (HOSPITAL_COMMUNITY): Payer: Self-pay | Admitting: Pharmacist

## 2019-11-30 ENCOUNTER — Other Ambulatory Visit: Payer: Self-pay | Admitting: Internal Medicine

## 2019-11-30 MED ORDER — ALLOPURINOL 100 MG PO TABS: 50.0000 mg | ORAL_TABLET | Freq: Every day | ORAL | 2 refills | Status: DC

## 2019-11-30 MED ORDER — SACUBITRIL-VALSARTAN 24-26 MG PO TABS: 1.0000 | ORAL_TABLET | Freq: Two times a day (BID) | ORAL | 3 refills | Status: DC

## 2019-11-30 MED ORDER — FENOFIBRATE 160 MG PO TABS: 160.0000 mg | ORAL_TABLET | Freq: Every day | ORAL | 2 refills | Status: DC

## 2019-11-30 NOTE — Telephone Encounter (Signed)
Reviewed chart pt is up-to-date sent refills to [facility].../lmb  

## 2019-11-30 NOTE — Progress Notes (Signed)
Paramedicine Encounter    Patient ID: Steven Chase, male    DOB: 09-19-1986, 34 y.o.   MRN: [id number]   Patient Care Team: Biagio Borg, MD as PCP - General (Internal Medicine) Nahser, Wonda Cheng, MD as PCP - Cardiology (Cardiology) Jorge Ny, LCSW as Social Worker (Licensed Clinical Social Worker)  Patient Active Problem List   Diagnosis Date Noted  . AKI (acute kidney injury) ([place]) 11/10/2019  . Hyperkalemia 11/10/2019  . Acute exacerbation of CHF (congestive heart failure) ([place]) 07/15/2019  . Lipodystrophy 10/21/2018  . Obesity (BMI 30-39.9) 10/06/2018  . Chronic systolic heart failure ([place]) 02/17/2018  . Fatigue 02/17/2018  . Cardiomyopathy ([place]) 02/11/2018  . Hypertensive crisis 02/04/2018  . Chest pain 02/04/2018  . CKD (chronic kidney disease), stage II 02/04/2018  . Allergic rhinitis 01/21/2018  . Hyperglycemia 12/02/2017  . Hypokalemia 12/02/2017  . Cough 12/02/2017  . Patellar tendonitis of left knee 12/02/2017  . Preventative health care 12/10/2016  . Obesity 05/25/2016  . Lower leg edema 12/12/2014  . Essential hypertension 11/05/2014  . OSA (obstructive sleep apnea) 11/05/2014    Current Outpatient Medications:  .  carvedilol (COREG) 12.5 MG tablet, Take 1 tablet (12.5 mg total) by mouth 2 (two) times daily with a meal., Disp: 60 tablet, Rfl: 0 .  hydrALAZINE (APRESOLINE) 50 MG tablet, Take 1 tablet (50 mg total) by mouth every 8 (eight) hours., Disp: 90 tablet, Rfl: 0 .  sacubitril-valsartan (ENTRESTO) 24-26 MG, Take 1 tablet by mouth 2 (two) times daily., Disp: 60 tablet, Rfl: 0 .  spironolactone (ALDACTONE) 25 MG tablet, Take 0.5 tablets (12.5 mg total) by mouth daily., Disp: 45 tablet, Rfl: 3 .  torsemide (DEMADEX) 20 MG tablet, Take 1 tablet (20 mg total) by mouth daily., Disp: 90 tablet, Rfl: 3 .  allopurinol (ZYLOPRIM) 100 MG tablet, Take 0.5 tablets (50 mg total) by mouth daily., Disp: 45 tablet, Rfl: 2 .  fenofibrate 160 MG tablet, Take 1 tablet  (160 mg total) by mouth daily., Disp: 90 tablet, Rfl: 2 .  icosapent Ethyl (VASCEPA) 1 g capsule, Take 2 capsules (2 g total) by mouth 2 (two) times daily. (Patient not taking: Reported on 11/30/2019), Disp: 120 capsule, Rfl: 11 No Known Allergies    Social History   Socioeconomic History  . Marital status: Single    Spouse name: Not on file  . Number of children: 1  . Years of education: 106  . Highest education level: Not on file  Occupational History  . Occupation: Research scientist (physical sciences): [employer]  Tobacco Use  . Smoking status: Never Smoker  . Smokeless tobacco: Never Used  Substance and Sexual Activity  . Alcohol use: No  . Drug use: No  . Sexual activity: Not on file  Other Topics Concern  . Not on file  Social History Narrative   Born and raised in [place], [place]. Currently live in a private residence with his mother. Fun: Play with his daughter.   Denies religious beliefs that would effect health care.    Social Determinants of Health   Financial Resource Strain: Low Risk   . Difficulty of Paying Living Expenses: Not very hard  Food Insecurity: Food Insecurity Present  . Worried About Charity fundraiser in the Last Year: Sometimes true  . Ran Out of Food in the Last Year: Sometimes true  Transportation Needs: No Transportation Needs  . Lack of Transportation (Medical): No  . Lack of Transportation (Non-Medical): No  Physical Activity:   . Days of Exercise per Week: Not on file  . Minutes of Exercise per Session: Not on file  Stress:   . Feeling of Stress : Not on file  Social Connections:   . Frequency of Communication with Friends and Family: Not on file  . Frequency of Social Gatherings with Friends and Family: Not on file  . Attends Religious Services: Not on file  . Active Member of Clubs or Organizations: Not on file  . Attends Banker Meetings: Not on file  . Marital Status: Not on file  Intimate Partner Violence:   . Fear of  Current or Ex-Partner: Not on file  . Emotionally Abused: Not on file  . Physically Abused: Not on file  . Sexually Abused: Not on file    Physical Exam Cardiovascular:     Rate and Rhythm: Normal rate and regular rhythm.     Pulses: Normal pulses.  Pulmonary:     Effort: Pulmonary effort is normal.     Breath sounds: Normal breath sounds.  Musculoskeletal:        General: Normal range of motion.     Right lower leg: No edema.     Left lower leg: No edema.  Skin:    General: Skin is warm and dry.     Capillary Refill: Capillary refill takes less than 2 seconds.  Neurological:     Mental Status: He is alert and oriented to person, place, and time.         Future Appointments  Date Time Provider Department Center  12/04/2019  9:15 AM Nadara Mustard, MD [facility] None  12/13/2019 10:00 AM [facility] None  03/11/2020  2:00 PM Bensimhon, Bevelyn Buckles, MD [facility] None  04/10/2020  3:00 PM Corwin Levins, MD [facility] None    BP 103/66 (BP Location: Left Arm, Patient Position: Sitting, Cuff Size: Normal)   Pulse 91   Resp 16   Wt 237 lb (107.5 kg)   SpO2 95%   BMI 38.25 kg/m   Weight yesterday- 235 lb Last visit weight- 240 lb  [name] was seen at home today and reported feeling well. He denied chest pain, SOB, headache, dizziness, orthopnea, fever or cough since out last visit. He stated he has been compliant with his medications over the past week and his weight has been stable. His medications were verified and his pillbox was refilled. He is out of [place] and the shipment from [place] was not delivered. I believe this may be due to him obtaining commercial insurance. I have obtained a co-pay card and will have a prescription sent to his pharmacy. I will follow up tomorrow to deliver the co-pay card.   [name], EMT 11/30/19  ACTION: Home visit completed Next visit planned for tomorrow

## 2019-11-30 NOTE — Telephone Encounter (Signed)
   *  STAT* If patient is at the pharmacy, call can be transferred to refill team.   1. Which medications need to be refilled? (please list name of each medication and dose if known)  allopurinol (ZYLOPRIM) 100 MG tablet and  fenofibrate 160 MG tablet 2. Which pharmacy/location (including street and city if local pharmacy) is medication to be sent to?[facility] - [place], [place] - [address]  3. Do they need a 30 day or 90 day supply? 30

## 2019-11-30 NOTE — Telephone Encounter (Signed)
Patient Advocate Encounter   Received notification from [employer] that prior authorization for Vascepa is required.   PA submitted via phone Key [id number] Status is pending   Will continue to follow.  Karle Plumber, PharmD, BCPS, BCCP, CPP Heart Failure Clinic Pharmacist [phone]

## 2019-12-01 ENCOUNTER — Telehealth (HOSPITAL_COMMUNITY): Payer: Self-pay | Admitting: Pharmacy Technician

## 2019-12-01 NOTE — Telephone Encounter (Addendum)
Advanced Heart Failure Patient Advocate Encounter  Prior Authorization for Icosapent Ethyl has been approved.  Vascepa brand is not approved.  Effective dates: 11/30/19 through 12/01/19  Patients co-pay is $150.00  Tier exception submitted for Icosapent Ethyl to reduce copay was denied.   Karle Plumber, PharmD, BCPS, BCCP, CPP Heart Failure Clinic Pharmacist [phone]

## 2019-12-01 NOTE — Telephone Encounter (Signed)
Patient Advocate Encounter   Received notification from [employer] that prior authorization for Sherryll Burger is required.   PA submitted on [employer]promptpa.com Key [id number] Status is pending   Will continue to follow.  Archer Asa, CPhT

## 2019-12-04 ENCOUNTER — Ambulatory Visit: Payer: 59 | Admitting: Orthopedic Surgery

## 2019-12-05 ENCOUNTER — Telehealth (HOSPITAL_COMMUNITY): Payer: Self-pay

## 2019-12-05 NOTE — Telephone Encounter (Signed)
I called Steven Chase to schedule an appointment. He did not answer so I left a message requesting he call me back.  [name], EMT 12/05/19

## 2019-12-05 NOTE — Telephone Encounter (Addendum)
Advanced Heart Failure Patient Advocate Encounter  Prior Authorization for Sherryll Burger has been approved.     Effective dates: 12/01/19 through 11/30/20  Patients co-pay is $75.00  Pharmacy Billing Information  BIN: [id number]  PCN: [id number]  Group: [id number]  ID: [id number]  Called patient's pharmacy and provided billing information. Cost is now $10, they are getting it ready for him.  Archer Asa

## 2019-12-06 ENCOUNTER — Telehealth (HOSPITAL_COMMUNITY): Payer: Self-pay | Admitting: Cardiology

## 2019-12-06 DIAGNOSIS — E881 Lipodystrophy, not elsewhere classified: Secondary | ICD-10-CM

## 2019-12-06 DIAGNOSIS — I5022 Chronic systolic (congestive) heart failure: Secondary | ICD-10-CM

## 2019-12-06 DIAGNOSIS — E781 Pure hyperglyceridemia: Secondary | ICD-10-CM

## 2019-12-06 MED ORDER — OMEGA-3-ACID ETHYL ESTERS 1 G PO CAPS: 2.0000 g | ORAL_CAPSULE | Freq: Two times a day (BID) | ORAL | 6 refills | Status: DC

## 2019-12-06 NOTE — Telephone Encounter (Signed)
Order for lipid clinic placed  °

## 2019-12-06 NOTE — Telephone Encounter (Signed)
Advanced Heart Failure Patient Advocate Encounter  Prior Authorization for Lovaza has been approved.    Effective dates: 12/04/19 through 12/03/20  Patients co-pay is $94.28. However, medication is available through GoodRx coupon for $29.68.   After discussion with Dr. Gala Romney, plan is to change icosapent ethyl (vascepa) to Lovaza and use Goodrx coupon. He is also being referred to Lipid Clinic for management of triglycerides.

## 2019-12-07 ENCOUNTER — Telehealth (HOSPITAL_COMMUNITY): Payer: Self-pay

## 2019-12-07 NOTE — Telephone Encounter (Signed)
I called Steven Chase after knocking on his door for several minutes with no answer. We were scheduled to have an appointment this morning at 09:00 but when he answered the phone he stated he had to go pay his rent. I asked when he would be home and he said not for another hour. I asked if he would be able to meet at 11:30 but he stated he was going to work early and asked that I come tomorrow morning at 08:15. I will try again at that time.   [name], EMT 12/07/19

## 2019-12-08 ENCOUNTER — Telehealth (HOSPITAL_COMMUNITY): Payer: Self-pay | Admitting: Pharmacist

## 2019-12-08 ENCOUNTER — Other Ambulatory Visit: Payer: Self-pay | Admitting: Internal Medicine

## 2019-12-08 ENCOUNTER — Other Ambulatory Visit (HOSPITAL_COMMUNITY): Payer: Self-pay

## 2019-12-08 MED ORDER — HYDRALAZINE HCL 50 MG PO TABS: 50.0000 mg | ORAL_TABLET | Freq: Three times a day (TID) | ORAL | 11 refills | Status: DC

## 2019-12-08 MED ORDER — CARVEDILOL 12.5 MG PO TABS: 12.5000 mg | ORAL_TABLET | Freq: Two times a day (BID) | ORAL | 11 refills | Status: DC

## 2019-12-08 NOTE — Telephone Encounter (Signed)
Sent in hydralazine and carvedilol refills in to [facility] per Lenore Cordia (paramedicine) request.  Karle Plumber, PharmD, BCPS, BCCP, CPP Heart Failure Clinic Pharmacist [phone]

## 2019-12-08 NOTE — Progress Notes (Signed)
Paramedicine Encounter    Patient ID: Steven Chase, male    DOB: 05/30/1986, 34 y.o.   MRN: [id number]   Patient Care Team: Biagio Borg, MD as PCP - General (Internal Medicine) Nahser, Wonda Cheng, MD as PCP - Cardiology (Cardiology) Jorge Ny, LCSW as Social Worker (Licensed Clinical Social Worker)  Patient Active Problem List   Diagnosis Date Noted  . AKI (acute kidney injury) ([place]) 11/10/2019  . Hyperkalemia 11/10/2019  . Acute exacerbation of CHF (congestive heart failure) ([place]) 07/15/2019  . Lipodystrophy 10/21/2018  . Obesity (BMI 30-39.9) 10/06/2018  . Chronic systolic heart failure ([place]) 02/17/2018  . Fatigue 02/17/2018  . Cardiomyopathy ([place]) 02/11/2018  . Hypertensive crisis 02/04/2018  . Chest pain 02/04/2018  . CKD (chronic kidney disease), stage II 02/04/2018  . Allergic rhinitis 01/21/2018  . Hyperglycemia 12/02/2017  . Hypokalemia 12/02/2017  . Cough 12/02/2017  . Patellar tendonitis of left knee 12/02/2017  . Preventative health care 12/10/2016  . Obesity 05/25/2016  . Lower leg edema 12/12/2014  . Essential hypertension 11/05/2014  . OSA (obstructive sleep apnea) 11/05/2014    Current Outpatient Medications:  .  carvedilol (COREG) 12.5 MG tablet, Take 1 tablet (12.5 mg total) by mouth 2 (two) times daily with a meal., Disp: 60 tablet, Rfl: 0 .  fenofibrate 160 MG tablet, Take 1 tablet (160 mg total) by mouth daily., Disp: 90 tablet, Rfl: 2 .  hydrALAZINE (APRESOLINE) 50 MG tablet, Take 1 tablet (50 mg total) by mouth every 8 (eight) hours., Disp: 90 tablet, Rfl: 0 .  sacubitril-valsartan (ENTRESTO) 24-26 MG, Take 1 tablet by mouth 2 (two) times daily., Disp: 60 tablet, Rfl: 3 .  spironolactone (ALDACTONE) 25 MG tablet, Take 0.5 tablets (12.5 mg total) by mouth daily., Disp: 45 tablet, Rfl: 3 .  torsemide (DEMADEX) 20 MG tablet, Take 1 tablet (20 mg total) by mouth daily., Disp: 90 tablet, Rfl: 3 .  allopurinol (ZYLOPRIM) 100 MG tablet, Take 0.5 tablets  (50 mg total) by mouth daily. (Patient not taking: Reported on 12/08/2019), Disp: 45 tablet, Rfl: 2 .  omega-3 acid ethyl esters (LOVAZA) 1 g capsule, Take 2 capsules (2 g total) by mouth 2 (two) times daily. (Patient not taking: Reported on 12/08/2019), Disp: 120 capsule, Rfl: 6 No Known Allergies    Social History   Socioeconomic History  . Marital status: Single    Spouse name: Not on file  . Number of children: 1  . Years of education: 2  . Highest education level: Not on file  Occupational History  . Occupation: Research scientist (physical sciences): [employer]  Tobacco Use  . Smoking status: Never Smoker  . Smokeless tobacco: Never Used  Substance and Sexual Activity  . Alcohol use: No  . Drug use: No  . Sexual activity: Not on file  Other Topics Concern  . Not on file  Social History Narrative   Born and raised in [place], [place]. Currently live in a private residence with his mother. Fun: Play with his daughter.   Denies religious beliefs that would effect health care.    Social Determinants of Health   Financial Resource Strain: Low Risk   . Difficulty of Paying Living Expenses: Not very hard  Food Insecurity: Food Insecurity Present  . Worried About Charity fundraiser in the Last Year: Sometimes true  . Ran Out of Food in the Last Year: Sometimes true  Transportation Needs: No Transportation Needs  . Lack of Transportation (Medical):  No  . Lack of Transportation (Non-Medical): No  Physical Activity:   . Days of Exercise per Week: Not on file  . Minutes of Exercise per Session: Not on file  Stress:   . Feeling of Stress : Not on file  Social Connections:   . Frequency of Communication with Friends and Family: Not on file  . Frequency of Social Gatherings with Friends and Family: Not on file  . Attends Religious Services: Not on file  . Active Member of Clubs or Organizations: Not on file  . Attends Banker Meetings: Not on file  . Marital Status: Not on  file  Intimate Partner Violence:   . Fear of Current or Ex-Partner: Not on file  . Emotionally Abused: Not on file  . Physically Abused: Not on file  . Sexually Abused: Not on file    Physical Exam Cardiovascular:     Rate and Rhythm: Normal rate and regular rhythm.     Pulses: Normal pulses.  Pulmonary:     Effort: Pulmonary effort is normal.     Breath sounds: Normal breath sounds.  Musculoskeletal:        General: Normal range of motion.     Right lower leg: No edema.     Left lower leg: No edema.  Skin:    General: Skin is warm and dry.     Capillary Refill: Capillary refill takes less than 2 seconds.  Neurological:     Mental Status: He is alert and oriented to person, place, and time.  Psychiatric:        Mood and Affect: Mood normal.         Future Appointments  Date Time Provider Department Center  12/13/2019 10:00 AM [facility] None  01/24/2020  3:30 PM Chrystie Nose, MD [facility]  03/11/2020  2:00 PM Bensimhon, Bevelyn Buckles, MD [facility] None  04/10/2020  3:00 PM Corwin Levins, MD [facility] None    BP 136/84 (BP Location: Left Arm, Patient Position: Sitting, Cuff Size: Normal)   Pulse 90   Resp 16   Wt 235 lb (106.6 kg)   SpO2 94%   BMI 37.93 kg/m   Weight yesterday- 237 lb Last visit weight- 237 lb  Mr Corona was seen at home today and reported feeling well. He denied chest pain, SOB, headache, dizziness, orthopnea, fever or cough since our last visit. He reported being compliant with his medications and his weight has been stable. His medications were verified and his pillbox was refilled. He has multiple medications which were ordered and he advised he would pick them up over the weekend. I will follow up next week.   [name], EMT 12/08/19  ACTION: Home visit completed Next visit planned for 1 week

## 2019-12-08 NOTE — Telephone Encounter (Signed)
Entered in error

## 2019-12-13 ENCOUNTER — Telehealth (HOSPITAL_COMMUNITY): Payer: Self-pay

## 2019-12-13 ENCOUNTER — Inpatient Hospital Stay (HOSPITAL_COMMUNITY): Admission: RE | Admit: 2019-12-13 | Payer: Self-pay | Source: Ambulatory Visit

## 2019-12-13 NOTE — Telephone Encounter (Signed)
I called [name] to see if he was coming to his appointment at the HF clinic this morning. He stated he forgot he had an appointment and was not going to make it, additionally he had not picked up his medications from the pharmacy but was planning to get them tomorrow. I got his rescheduled with the APP clinic for next week and will see him on Friday at 08:30 this week.   [name], EMT 12/13/19

## 2019-12-15 ENCOUNTER — Other Ambulatory Visit (HOSPITAL_COMMUNITY): Payer: Self-pay

## 2019-12-15 NOTE — Progress Notes (Signed)
Paramedicine Encounter    Patient ID: Steven Chase, male    DOB: 1986-10-19, 34 y.o.   MRN: [id number]   Patient Care Team: Biagio Borg, MD as PCP - General (Internal Medicine) Nahser, Wonda Cheng, MD as PCP - Cardiology (Cardiology) Jorge Ny, LCSW as Social Worker (Licensed Clinical Social Worker)  Patient Active Problem List   Diagnosis Date Noted  . AKI (acute kidney injury) ([place]) 11/10/2019  . Hyperkalemia 11/10/2019  . Acute exacerbation of CHF (congestive heart failure) ([place]) 07/15/2019  . Lipodystrophy 10/21/2018  . Obesity (BMI 30-39.9) 10/06/2018  . Chronic systolic heart failure ([place]) 02/17/2018  . Fatigue 02/17/2018  . Cardiomyopathy ([place]) 02/11/2018  . Hypertensive crisis 02/04/2018  . Chest pain 02/04/2018  . CKD (chronic kidney disease), stage II 02/04/2018  . Allergic rhinitis 01/21/2018  . Hyperglycemia 12/02/2017  . Hypokalemia 12/02/2017  . Cough 12/02/2017  . Patellar tendonitis of left knee 12/02/2017  . Preventative health care 12/10/2016  . Obesity 05/25/2016  . Lower leg edema 12/12/2014  . Essential hypertension 11/05/2014  . OSA (obstructive sleep apnea) 11/05/2014    Current Outpatient Medications:  .  carvedilol (COREG) 12.5 MG tablet, TAKE 1 TABLET BY MOUTH TWICE DAILY WITH A MEAL, Disp: 180 tablet, Rfl: 2 .  fenofibrate 160 MG tablet, Take 1 tablet (160 mg total) by mouth daily., Disp: 90 tablet, Rfl: 2 .  hydrALAZINE (APRESOLINE) 50 MG tablet, TAKE 1 TABLET BY MOUTH EVERY 8 HOURS, Disp: 90 tablet, Rfl: 2 .  omega-3 acid ethyl esters (LOVAZA) 1 g capsule, Take 2 capsules (2 g total) by mouth 2 (two) times daily., Disp: 120 capsule, Rfl: 6 .  sacubitril-valsartan (ENTRESTO) 24-26 MG, Take 1 tablet by mouth 2 (two) times daily., Disp: 60 tablet, Rfl: 3 .  spironolactone (ALDACTONE) 25 MG tablet, Take 0.5 tablets (12.5 mg total) by mouth daily., Disp: 45 tablet, Rfl: 3 .  torsemide (DEMADEX) 20 MG tablet, Take 1 tablet (20 mg total) by mouth  daily., Disp: 90 tablet, Rfl: 3 .  allopurinol (ZYLOPRIM) 100 MG tablet, Take 0.5 tablets (50 mg total) by mouth daily. (Patient not taking: Reported on 12/08/2019), Disp: 45 tablet, Rfl: 2 .  carvedilol (COREG) 12.5 MG tablet, Take 1 tablet (12.5 mg total) by mouth 2 (two) times daily with a meal., Disp: 60 tablet, Rfl: 11 .  hydrALAZINE (APRESOLINE) 50 MG tablet, Take 1 tablet (50 mg total) by mouth every 8 (eight) hours., Disp: 90 tablet, Rfl: 11 No Known Allergies    Social History   Socioeconomic History  . Marital status: Single    Spouse name: Not on file  . Number of children: 1  . Years of education: 30  . Highest education level: Not on file  Occupational History  . Occupation: Research scientist (physical sciences): [employer]  Tobacco Use  . Smoking status: Never Smoker  . Smokeless tobacco: Never Used  Substance and Sexual Activity  . Alcohol use: No  . Drug use: No  . Sexual activity: Not on file  Other Topics Concern  . Not on file  Social History Narrative   Born and raised in [place], [place]. Currently live in a private residence with his mother. Fun: Play with his daughter.   Denies religious beliefs that would effect health care.    Social Determinants of Health   Financial Resource Strain: Low Risk   . Difficulty of Paying Living Expenses: Not very hard  Food Insecurity: Food Insecurity Present  .  Worried About Programme researcher, broadcasting/film/video in the Last Year: Sometimes true  . Ran Out of Food in the Last Year: Sometimes true  Transportation Needs: No Transportation Needs  . Lack of Transportation (Medical): No  . Lack of Transportation (Non-Medical): No  Physical Activity:   . Days of Exercise per Week: Not on file  . Minutes of Exercise per Session: Not on file  Stress:   . Feeling of Stress : Not on file  Social Connections:   . Frequency of Communication with Friends and Family: Not on file  . Frequency of Social Gatherings with Friends and Family: Not on file  .  Attends Religious Services: Not on file  . Active Member of Clubs or Organizations: Not on file  . Attends Banker Meetings: Not on file  . Marital Status: Not on file  Intimate Partner Violence:   . Fear of Current or Ex-Partner: Not on file  . Emotionally Abused: Not on file  . Physically Abused: Not on file  . Sexually Abused: Not on file    Physical Exam Cardiovascular:     Rate and Rhythm: Normal rate and regular rhythm.     Pulses: Normal pulses.  Pulmonary:     Effort: Pulmonary effort is normal.     Breath sounds: Normal breath sounds.  Abdominal:     General: There is no distension.  Musculoskeletal:        General: Normal range of motion.     Right lower leg: Edema present.     Left lower leg: Edema present.  Skin:    General: Skin is warm and dry.     Capillary Refill: Capillary refill takes less than 2 seconds.  Neurological:     Mental Status: He is alert and oriented to person, place, and time.  Psychiatric:        Mood and Affect: Mood normal.         Future Appointments  Date Time Provider Department Center  12/21/2019  8:30 AM [facility] PA/NP [facility] None  01/24/2020  3:30 PM Chrystie Nose, MD [facility]  03/11/2020  2:00 PM Bensimhon, Bevelyn Buckles, MD [facility] None  04/10/2020  3:00 PM Corwin Levins, MD [facility] None    BP 137/85 (BP Location: Left Arm, Patient Position: Sitting, Cuff Size: Normal)   Pulse 85   Resp 16   Wt 236 lb (107 kg)   SpO2 98%   BMI 38.09 kg/m   Weight yesterday- 235 lb Last visit weight- 236 lb  [name] was seen at home today and reported feeling well. He denied chest pain, SOB, headache, dizziness, orthopnea, fever or cough since our last visit. He reported being compliant with his medications over the past week with the exception of allopurinol, Lovaza and fenofibrate however he was able to get the latter two yesterday. He reported his weight being stable but he is exhibiting BLEE. He was instructed to  decrease his fluid intake and monitor the sodium content of the foods he is eating. He was understanding and agreeable. His medications were verified and his pillbox was refilled. I will follow up next week.   [name], EMT 12/15/19  ACTION: Home visit completed Next visit planned for 1 week

## 2019-12-21 ENCOUNTER — Encounter (HOSPITAL_COMMUNITY): Payer: 59

## 2019-12-21 ENCOUNTER — Other Ambulatory Visit (HOSPITAL_COMMUNITY): Payer: Self-pay

## 2019-12-21 NOTE — Progress Notes (Signed)
Steven Chase was seen at home today to refill his pillbox ahead of him going to work. He denied chest pain, SOB, headache, orthopnea, fever or cough however due to deteriorating weather conditions, a full visit was not completed. His medications were verified and his pillbox was refilled. He has not picked up allopurinol form the pharmacy but stated it was there and ready. I will follow up next week.   [name], EMT 12/21/19

## 2019-12-28 ENCOUNTER — Other Ambulatory Visit (HOSPITAL_COMMUNITY): Payer: Self-pay

## 2019-12-28 NOTE — Progress Notes (Signed)
Paramedicine Encounter    Patient ID: Steven Chase, male    DOB: [birth year], 34 y.o.   MRN: [id number]   Patient Care Team: Steven Borg, Chase as PCP - General (Internal Medicine) Nahser, Wonda Cheng, Chase as PCP - Cardiology (Cardiology) Steven Chase as Social Worker (Licensed Clinical Social Worker)  Patient Active Problem List   Diagnosis Date Noted  . AKI (acute kidney injury) (Steven Chase) 11/10/2019  . Hyperkalemia 11/10/2019  . Acute exacerbation of CHF (congestive heart failure) (Steven Chase) 07/15/2019  . Lipodystrophy 10/21/2018  . Obesity (BMI 30-39.9) 10/06/2018  . Chronic systolic heart failure (Steven Chase) 02/17/2018  . Fatigue 02/17/2018  . Cardiomyopathy (Steven Chase) 02/11/2018  . Hypertensive crisis 02/04/2018  . Chest pain 02/04/2018  . CKD (chronic kidney disease), stage II 02/04/2018  . Allergic rhinitis 01/21/2018  . Hyperglycemia 12/02/2017  . Hypokalemia 12/02/2017  . Cough 12/02/2017  . Patellar tendonitis of left knee 12/02/2017  . Preventative health care 12/10/2016  . Obesity 05/25/2016  . Lower leg edema 12/12/2014  . Essential hypertension 11/05/2014  . Steven (obstructive sleep apnea) 11/05/2014    Current Outpatient Medications:  .  allopurinol (ZYLOPRIM) 100 MG tablet, Take 0.5 tablets (50 mg total) by mouth daily., Disp: 45 tablet, Rfl: 2 .  carvedilol (COREG) 12.5 MG tablet, TAKE 1 TABLET BY MOUTH TWICE DAILY WITH A MEAL, Disp: 180 tablet, Rfl: 2 .  fenofibrate 160 MG tablet, Take 1 tablet (160 mg total) by mouth daily., Disp: 90 tablet, Rfl: 2 .  hydrALAZINE (APRESOLINE) 50 MG tablet, TAKE 1 TABLET BY MOUTH EVERY 8 HOURS, Disp: 90 tablet, Rfl: 2 .  omega-3 acid ethyl esters (LOVAZA) 1 g capsule, Take 2 capsules (2 g total) by mouth 2 (two) times daily., Disp: 120 capsule, Rfl: 6 .  sacubitril-valsartan (ENTRESTO) 24-26 MG, Take 1 tablet by mouth 2 (two) times daily., Disp: 60 tablet, Rfl: 3 .  spironolactone (ALDACTONE) 25 MG tablet, Take 0.5 tablets (12.5 mg total) by  mouth daily., Disp: 45 tablet, Rfl: 3 .  torsemide (DEMADEX) 20 MG tablet, Take 1 tablet (20 mg total) by mouth daily., Disp: 90 tablet, Rfl: 3 .  carvedilol (COREG) 12.5 MG tablet, Take 1 tablet (12.5 mg total) by mouth 2 (two) times daily with a meal., Disp: 60 tablet, Rfl: 11 .  hydrALAZINE (APRESOLINE) 50 MG tablet, Take 1 tablet (50 mg total) by mouth every 8 (eight) hours., Disp: 90 tablet, Rfl: 11 No Known Allergies    Social History   Socioeconomic History  . Marital status: Single    Spouse name: Not on file  . Number of children: 1  . Years of education: 67  . Highest education level: Not on file  Occupational History  . Occupation: Research scientist (physical sciences): [employer]  Tobacco Use  . Smoking status: Never Smoker  . Smokeless tobacco: Never Used  Substance and Sexual Activity  . Alcohol use: No  . Drug use: No  . Sexual activity: Not on file  Other Topics Concern  . Not on file  Social History Narrative   Born and raised in [place], [place]. Currently live in a private residence with his mother. Fun: Play with his daughter.   Denies religious beliefs that would effect health care.    Social Determinants of Health   Financial Resource Strain: Low Risk   . Difficulty of Paying Living Expenses: Not very hard  Food Insecurity: Food Insecurity Present  . Worried About Charity fundraiser  in the Last Year: Sometimes true  . Ran Out of Food in the Last Year: Sometimes true  Transportation Needs: No Transportation Needs  . Lack of Transportation (Medical): No  . Lack of Transportation (Non-Medical): No  Physical Activity:   . Days of Exercise per Week: Not on file  . Minutes of Exercise per Session: Not on file  Stress:   . Feeling of Stress : Not on file  Social Connections:   . Frequency of Communication with Friends and Family: Not on file  . Frequency of Social Gatherings with Friends and Family: Not on file  . Attends Religious Services: Not on file  .  Active Member of Clubs or Organizations: Not on file  . Attends Banker Meetings: Not on file  . Marital Status: Not on file  Intimate Partner Violence:   . Fear of Current or Ex-Partner: Not on file  . Emotionally Abused: Not on file  . Physically Abused: Not on file  . Sexually Abused: Not on file    Physical Exam Cardiovascular:     Rate and Rhythm: Normal rate and regular rhythm.     Pulses: Normal pulses.  Pulmonary:     Effort: Pulmonary effort is normal.     Breath sounds: Normal breath sounds.  Abdominal:     General: There is no distension.  Musculoskeletal:        General: Normal range of motion.     Right lower leg: No edema.     Left lower leg: No edema.  Skin:    General: Skin is warm and dry.     Capillary Refill: Capillary refill takes less than 2 seconds.  Neurological:     Mental Status: He is alert and oriented to person, place, and time.  Psychiatric:        Mood and Affect: Mood normal.         Future Appointments  Date Time Provider Department Center  01/02/2020  2:00 PM [facility] PA/NP [facility] None  01/24/2020  3:30 PM Steven Nose, Chase [facility]  03/11/2020  2:00 PM Bensimhon, Steven Buckles, Chase [facility] None  04/10/2020  3:00 PM Steven Chase [facility] None    BP 130/90 (BP Location: Left Arm, Patient Position: Sitting, Cuff Size: Large)   Pulse 92   Resp 16   Wt 236 lb (107 kg)   SpO2 95%   BMI 38.09 kg/m   Weight yesterday- 235 lb Last visit weight- 236 lb  Steven Chase was seen at home today and reported feeling generally well. He denied chest pain, SOB, headache, dizziness, orthopnea, fever or cough in the past week. He reported being compliant with his medications over the past week and his weight has been stable. His medications were verified and his pillbox was refilled. He has asked to be referred to a weight loss clinic for consideration of gastric bypass. I advised that I don't know all the qualifications for the  surgery but would have the HF clinic look into referring him. He was understanding and agreeable. I will follow up next week.   Steven Chase, EMT 12/28/19  ACTION: Home visit completed Next visit planned for 1 week

## 2019-12-29 ENCOUNTER — Encounter (HOSPITAL_COMMUNITY): Payer: Self-pay

## 2019-12-29 ENCOUNTER — Ambulatory Visit (HOSPITAL_COMMUNITY)
Admission: EM | Admit: 2019-12-29 | Discharge: 2019-12-29 | Disposition: A | Discharge status: Home / Self Care | Payer: 59 | Attending: Family Medicine | Admitting: Family Medicine

## 2019-12-29 ENCOUNTER — Telehealth (HOSPITAL_COMMUNITY): Payer: Self-pay | Admitting: Licensed Clinical Social Worker

## 2019-12-29 ENCOUNTER — Other Ambulatory Visit: Payer: Self-pay

## 2019-12-29 DIAGNOSIS — M25512 Pain in left shoulder: Secondary | ICD-10-CM | POA: Diagnosis not present

## 2019-12-29 MED ORDER — PREDNISONE 10 MG PO TABS: 20.0000 mg | ORAL_TABLET | Freq: Every day | ORAL | 0 refills | Status: AC

## 2019-12-29 NOTE — ED Triage Notes (Signed)
Pt is here with left shoulder pain that started today, pt has taken Tylenol to relieve discomfort.

## 2019-12-29 NOTE — Discharge Instructions (Addendum)
Take the medication as prescribed This is most likely arthritis.  Follow up as needed for continued or worsening symptoms If you start developing any chest pain, weakness, dizziness or SOB please go to the ER

## 2019-12-29 NOTE — Telephone Encounter (Signed)
CSW informed by [employer] that pt is interested in weight loss surgery and is wanting more information.  CSW called pt and provided with number to set up Weight Loss Seminar appt to get more information and learn if he is eligible to pursue surgery.  Also encouraged pt to reach out to his insurance provider to see if weight loss surgery would be covered under his plan  CSW will continue to follow and assist as needed  Burna Sis, LCSW Clinical Social Worker [facility] Desk#: [phone] Cell#: [phone]

## 2019-12-30 NOTE — ED Provider Notes (Signed)
[facility]    CSN: [id number] Arrival date & time: 12/29/19  1532      History   Chief Complaint Chief Complaint  Patient presents with  . Shoulder Pain    Left    HPI Steven Chase is a 34 y.o. male.   Patient is a 6-year male past medical history of heart failure, hypertension, obesity, OSA.  He presents today with acute left shoulder pain that started earlier today.  The problem started when he was lifting up heavy boxes.  The pain is located in a joint area of the left shoulder.  The pain is worse with certain movements and palpation.  Denies any radiation of pain, chest pain or shortness of breath.  He has not taken anything for the pain.  No numbness, tingling or weakness of the extremity.no injury.   ROS per HPI    Shoulder Pain   Past Medical History:  Diagnosis Date  . CHF (congestive heart failure) ([place])   . Hypertension   . Obesity   . OSA (obstructive sleep apnea) 11/05/2014   02/2015 -severe -AHI 106/h     Patient Active Problem List   Diagnosis Date Noted  . AKI (acute kidney injury) ([place]) 11/10/2019  . Hyperkalemia 11/10/2019  . Acute exacerbation of CHF (congestive heart failure) ([place]) 07/15/2019  . Lipodystrophy 10/21/2018  . Obesity (BMI 30-39.9) 10/06/2018  . Chronic systolic heart failure ([place]) 02/17/2018  . Fatigue 02/17/2018  . Cardiomyopathy ([place]) 02/11/2018  . Hypertensive crisis 02/04/2018  . Chest pain 02/04/2018  . CKD (chronic kidney disease), stage II 02/04/2018  . Allergic rhinitis 01/21/2018  . Hyperglycemia 12/02/2017  . Hypokalemia 12/02/2017  . Cough 12/02/2017  . Patellar tendonitis of left knee 12/02/2017  . Preventative health care 12/10/2016  . Obesity 05/25/2016  . Lower leg edema 12/12/2014  . Essential hypertension 11/05/2014  . OSA (obstructive sleep apnea) 11/05/2014    Past Surgical History:  Procedure Laterality Date  . HERNIA REPAIR    . RIGHT/LEFT HEART CATH AND CORONARY ANGIOGRAPHY N/A  02/07/2018   Procedure: RIGHT/LEFT HEART CATH AND CORONARY ANGIOGRAPHY;  Surgeon: Leonie Man, MD;  Location: [facility];  Service: Cardiovascular;  Laterality: N/A;       Home Medications    Prior to Admission medications   Medication Sig Start Date End Date Taking? Authorizing Provider  allopurinol (ZYLOPRIM) 100 MG tablet Take 0.5 tablets (50 mg total) by mouth daily. 11/30/19 12/30/19  Biagio Borg, MD  carvedilol (COREG) 12.5 MG tablet TAKE 1 TABLET BY MOUTH TWICE DAILY WITH A MEAL 12/08/19   Biagio Borg, MD  carvedilol (COREG) 12.5 MG tablet Take 1 tablet (12.5 mg total) by mouth 2 (two) times daily with a meal. 12/08/19   Bensimhon, Shaune Pascal, MD  fenofibrate 160 MG tablet Take 1 tablet (160 mg total) by mouth daily. 11/30/19   Biagio Borg, MD  hydrALAZINE (APRESOLINE) 50 MG tablet TAKE 1 TABLET BY MOUTH EVERY 8 HOURS 12/08/19   Biagio Borg, MD  hydrALAZINE (APRESOLINE) 50 MG tablet Take 1 tablet (50 mg total) by mouth every 8 (eight) hours. 12/08/19   Bensimhon, Shaune Pascal, MD  omega-3 acid ethyl esters (LOVAZA) 1 g capsule Take 2 capsules (2 g total) by mouth 2 (two) times daily. 12/06/19   Bensimhon, Shaune Pascal, MD  predniSONE (DELTASONE) 10 MG tablet Take 2 tablets (20 mg total) by mouth daily for 3 days. 12/29/19 01/01/20  Orvan July, NP  sacubitril-valsartan (ENTRESTO) 24-26 MG Take 1 tablet by mouth 2 (two) times daily. 11/30/19   Bensimhon, Bevelyn Buckles, MD  spironolactone (ALDACTONE) 25 MG tablet Take 0.5 tablets (12.5 mg total) by mouth daily. 11/21/19 02/19/20  [name] M, PA-C  torsemide (DEMADEX) 20 MG tablet Take 1 tablet (20 mg total) by mouth daily. 11/16/19 02/14/20  Clegg, Amy D, NP  isosorbide mononitrate (IMDUR) 60 MG 24 hr tablet Take 1 tablet (60 mg total) by mouth daily. Patient not taking: Reported on 11/21/2019 11/15/19 11/26/19  Bensimhon, Bevelyn Buckles, MD  rosuvastatin (CRESTOR) 40 MG tablet Take 1 tablet (40 mg total) by mouth daily at 6 PM. Patient not taking:  Reported on 11/15/2019 11/13/19 11/26/19  Myrlene Broker, MD    Family History Family History  Problem Relation Age of Onset  . Hypertension Mother   . Heart disease Maternal Grandfather   . Hypertension Maternal Grandfather   . Hypertension Sister     Social History Social History   Tobacco Use  . Smoking status: Never Smoker  . Smokeless tobacco: Never Used  Substance Use Topics  . Alcohol use: No  . Drug use: No     Allergies   Patient has no known allergies.   Review of Systems Review of Systems   Physical Exam Triage Vital Signs ED Triage Vitals  Enc Vitals Group     BP 12/29/19 1553 (!) 75/47     Pulse Rate 12/29/19 1553 (!) 105     Resp 12/29/19 1553 17     Temp 12/29/19 1553 98.7 F (37.1 C)     Temp Source 12/29/19 1553 Oral     SpO2 12/29/19 1553 94 %     Weight 12/29/19 1551 253 lb 6.4 oz (114.9 kg)     Height --      Head Circumference --      Peak Flow --      Pain Score 12/29/19 1551 7     Pain Loc --      Pain Edu? --      Excl. in GC? --    No data found.  Updated Vital Signs BP (!) 85/55   Pulse (!) 105   Temp 98.7 F (37.1 C) (Oral)   Resp 17   Wt 253 lb 6.4 oz (114.9 kg)   SpO2 94%   BMI 40.90 kg/m   Visual Acuity Right Eye Distance:   Left Eye Distance:   Bilateral Distance:    Right Eye Near:   Left Eye Near:    Bilateral Near:     Physical Exam Vitals and nursing note reviewed.  Constitutional:      Appearance: Normal appearance.  HENT:     Head: Normocephalic and atraumatic.     Nose: Nose normal.  Eyes:     Conjunctiva/sclera: Conjunctivae normal.  Pulmonary:     Effort: Pulmonary effort is normal.  Musculoskeletal:     Left shoulder: Tenderness present. No swelling, deformity, effusion or laceration. Decreased range of motion.     Cervical back: Normal range of motion.     Comments: Tender to palpation of left shoulder joint space. Limited range of motion with internal and external rotation of the  shoulder  Skin:    General: Skin is warm and dry.  Neurological:     Mental Status: He is alert.  Psychiatric:        Mood and Affect: Mood normal.      UC Treatments / Results  Labs (  all labs ordered are listed, but only abnormal results are displayed) Labs Reviewed - No data to display  EKG   Radiology No results found.  Procedures Procedures (including critical care time)  Medications Ordered in UC Medications - No data to display  Initial Impression / Assessment and Plan / UC Course  I have reviewed the triage vital signs and the nursing notes.  Pertinent labs & imaging results that were available during my care of the patient were reviewed by me and considered in my medical decision making (see chart for details).     Shoulder pain-most likely  arthritis. Patient has a history of this along with a history of gout. Minor concern today for blood pressure being checked with reading of 85/55. This was checked in both arms and numbers range about the same. Looking back at previous visits patient has had lower blood pressures similar in the past.  Patient is completely asymptomatic he denies any dizziness, vision changes, diaphoresis, nausea, chest pain or shortness of breath. Repeat EKG done with similar reading from previous EKG. No concern for ACS at this time. Recommended if he starts developing any of the symptoms listed above that he needs to go straight to the ER. Treating the pain with 3 days of prednisone 20 mg. Work note given for rest Follow up as needed for continued or worsening symptoms  Final Clinical Impressions(s) / UC Diagnoses   Final diagnoses:  Pain in joint of left shoulder     Discharge Instructions     Take the medication as prescribed This is most likely arthritis.  Follow up as needed for continued or worsening symptoms If you start developing any chest pain, weakness, dizziness or SOB please go to the ER      ED Prescriptions     Medication Sig Dispense Auth. Provider   predniSONE (DELTASONE) 10 MG tablet Take 2 tablets (20 mg total) by mouth daily for 3 days. 6 tablet Dahlia Byes A, NP     PDMP not reviewed this encounter.   Dahlia Byes A, NP 12/30/19 [phone]

## 2020-01-02 ENCOUNTER — Encounter (HOSPITAL_COMMUNITY): Payer: 59

## 2020-01-03 NOTE — Progress Notes (Deleted)
Subjective:    Patient ID: Steven Chase, male    DOB: 01/24/1986, 34 y.o.   MRN: [id number]  HPI The patient is here for an acute visit.  Gout issues: he went to the urgent care 11/26/19 for gout.Marland Kitchen  He had right toe pain x 1 day. He a hospitalization earlier than month and had gout at that time.  His uric acid level was 14.  He was started on prednisone 20 mg x 3 days and advised to start allopurinol once his acute episode subsided.      Can inc allopurinol to 100, can start colchicin 0.6 mg daily   Medications and allergies reviewed with patient and updated if appropriate.  Patient Active Problem List   Diagnosis Date Noted  . AKI (acute kidney injury) ([facility]) 11/10/2019  . Hyperkalemia 11/10/2019  . Acute exacerbation of CHF (congestive heart failure) ([facility]) 07/15/2019  . Lipodystrophy 10/21/2018  . Obesity (BMI 30-39.9) 10/06/2018  . Chronic systolic heart failure ([facility]) 02/17/2018  . Fatigue 02/17/2018  . Cardiomyopathy ([facility]) 02/11/2018  . Hypertensive crisis 02/04/2018  . Chest pain 02/04/2018  . CKD (chronic kidney disease), stage II 02/04/2018  . Allergic rhinitis 01/21/2018  . Hyperglycemia 12/02/2017  . Hypokalemia 12/02/2017  . Cough 12/02/2017  . Patellar tendonitis of left knee 12/02/2017  . Preventative health care 12/10/2016  . Obesity 05/25/2016  . Lower leg edema 12/12/2014  . Essential hypertension 11/05/2014  . OSA (obstructive sleep apnea) 11/05/2014    Current Outpatient Medications on File Prior to Visit  Medication Sig Dispense Refill  . allopurinol (ZYLOPRIM) 100 MG tablet Take 0.5 tablets (50 mg total) by mouth daily. 45 tablet 2  . carvedilol (COREG) 12.5 MG tablet TAKE 1 TABLET BY MOUTH TWICE DAILY WITH A MEAL 180 tablet 2  . carvedilol (COREG) 12.5 MG tablet Take 1 tablet (12.5 mg total) by mouth 2 (two) times daily with a meal. 60 tablet 11  . fenofibrate 160 MG tablet Take 1 tablet (160 mg total) by mouth daily. 90 tablet 2  . hydrALAZINE  (APRESOLINE) 50 MG tablet TAKE 1 TABLET BY MOUTH EVERY 8 HOURS 90 tablet 2  . hydrALAZINE (APRESOLINE) 50 MG tablet Take 1 tablet (50 mg total) by mouth every 8 (eight) hours. 90 tablet 11  . omega-3 acid ethyl esters (LOVAZA) 1 g capsule Take 2 capsules (2 g total) by mouth 2 (two) times daily. 120 capsule 6  . sacubitril-valsartan (ENTRESTO) 24-26 MG Take 1 tablet by mouth 2 (two) times daily. 60 tablet 3  . spironolactone (ALDACTONE) 25 MG tablet Take 0.5 tablets (12.5 mg total) by mouth daily. 45 tablet 3  . torsemide (DEMADEX) 20 MG tablet Take 1 tablet (20 mg total) by mouth daily. 90 tablet 3  . [DISCONTINUED] isosorbide mononitrate (IMDUR) 60 MG 24 hr tablet Take 1 tablet (60 mg total) by mouth daily. (Patient not taking: Reported on 11/21/2019) 30 tablet 3  . [DISCONTINUED] rosuvastatin (CRESTOR) 40 MG tablet Take 1 tablet (40 mg total) by mouth daily at 6 PM. (Patient not taking: Reported on 11/15/2019) 90 tablet 0   No current facility-administered medications on file prior to visit.    Past Medical History:  Diagnosis Date  . CHF (congestive heart failure) (H[facility])   . Hypertension   . Obesity   . OSA (obstructive sleep apnea) 11/05/2014   02/2015 -severe -AHI 106/h     Past Surgical History:  Procedure Laterality Date  . HERNIA REPAIR    .  RIGHT/LEFT HEART CATH AND CORONARY ANGIOGRAPHY N/A 02/07/2018   Procedure: RIGHT/LEFT HEART CATH AND CORONARY ANGIOGRAPHY;  Surgeon: Leonie Man, MD;  Location: [facility];  Service: Cardiovascular;  Laterality: N/A;    Social History   Socioeconomic History  . Marital status: Single    Spouse name: Not on file  . Number of children: 1  . Years of education: 59  . Highest education level: Not on file  Occupational History  . Occupation: Research scientist (physical sciences): [employer]  Tobacco Use  . Smoking status: Never Smoker  . Smokeless tobacco: Never Used  Substance and Sexual Activity  . Alcohol use: No  . Drug use: No  .  Sexual activity: Yes    Birth control/protection: Condom  Other Topics Concern  . Not on file  Social History Narrative   Born and raised in [place], [place]. Currently live in a private residence with his mother. Fun: Play with his daughter.   Denies religious beliefs that would effect health care.    Social Determinants of Health   Financial Resource Strain: Low Risk   . Difficulty of Paying Living Expenses: Not very hard  Food Insecurity: Food Insecurity Present  . Worried About Charity fundraiser in the Last Year: Sometimes true  . Ran Out of Food in the Last Year: Sometimes true  Transportation Needs: No Transportation Needs  . Lack of Transportation (Medical): No  . Lack of Transportation (Non-Medical): No  Physical Activity:   . Days of Exercise per Week: Not on file  . Minutes of Exercise per Session: Not on file  Stress:   . Feeling of Stress : Not on file  Social Connections:   . Frequency of Communication with Friends and Family: Not on file  . Frequency of Social Gatherings with Friends and Family: Not on file  . Attends Religious Services: Not on file  . Active Member of Clubs or Organizations: Not on file  . Attends Archivist Meetings: Not on file  . Marital Status: Not on file    Family History  Problem Relation Age of Onset  . Hypertension Mother   . Heart disease Maternal Grandfather   . Hypertension Maternal Grandfather   . Hypertension Sister     Review of Systems     Objective:  There were no vitals filed for this visit. BP Readings from Last 3 Encounters:  12/29/19 (!) 85/55  12/28/19 130/90  12/15/19 137/85   Wt Readings from Last 3 Encounters:  12/29/19 253 lb 6.4 oz (114.9 kg)  12/28/19 236 lb (107 kg)  12/15/19 236 lb (107 kg)   There is no height or weight on file to calculate BMI.   Physical Exam         Assessment & Plan:    See Problem List for Assessment and Plan of chronic medical problems.    This visit  occurred during the SARS-CoV-2 public health emergency.  Safety protocols were in place, including screening questions prior to the visit, additional usage of staff PPE, and extensive cleaning of exam room while observing appropriate contact time as indicated for disinfecting solutions.

## 2020-01-04 ENCOUNTER — Ambulatory Visit: Payer: 59 | Admitting: Internal Medicine

## 2020-01-04 ENCOUNTER — Other Ambulatory Visit (HOSPITAL_COMMUNITY): Payer: Self-pay

## 2020-01-04 ENCOUNTER — Encounter: Payer: Self-pay | Admitting: Internal Medicine

## 2020-01-04 ENCOUNTER — Telehealth: Payer: Self-pay | Admitting: Internal Medicine

## 2020-01-04 ENCOUNTER — Other Ambulatory Visit: Payer: Self-pay

## 2020-01-04 VITALS — BP 124/82 | HR 108 | Temp 98.4°F | Resp 20 | Ht 66.0 in

## 2020-01-04 DIAGNOSIS — M109 Gout, unspecified: Secondary | ICD-10-CM | POA: Insufficient documentation

## 2020-01-04 MED ORDER — COLCHICINE 0.6 MG PO TABS: 0.6000 mg | ORAL_TABLET | Freq: Every day | ORAL | 5 refills | Status: DC

## 2020-01-04 MED ORDER — PREDNISONE 10 MG PO TABS: ORAL_TABLET | ORAL | 0 refills | Status: DC

## 2020-01-04 NOTE — Progress Notes (Signed)
Paramedicine Encounter    Patient ID: Steven Chase, male    DOB: 1986-05-22, 34 y.o.   MRN: [id number]   Patient Care Team: Biagio Borg, MD as PCP - General (Internal Medicine) Nahser, Wonda Cheng, MD as PCP - Cardiology (Cardiology) Jorge Ny, LCSW as Social Worker (Licensed Clinical Social Worker)  Patient Active Problem List   Diagnosis Date Noted  . Acute gout of left ankle 01/04/2020  . AKI (acute kidney injury) ([place]) 11/10/2019  . Hyperkalemia 11/10/2019  . Acute exacerbation of CHF (congestive heart failure) ([place]) 07/15/2019  . Lipodystrophy 10/21/2018  . Obesity (BMI 30-39.9) 10/06/2018  . Chronic systolic heart failure ([place]) 02/17/2018  . Fatigue 02/17/2018  . Cardiomyopathy ([place]) 02/11/2018  . Hypertensive crisis 02/04/2018  . Chest pain 02/04/2018  . CKD (chronic kidney disease), stage II 02/04/2018  . Allergic rhinitis 01/21/2018  . Hyperglycemia 12/02/2017  . Hypokalemia 12/02/2017  . Cough 12/02/2017  . Patellar tendonitis of left knee 12/02/2017  . Preventative health care 12/10/2016  . Obesity 05/25/2016  . Lower leg edema 12/12/2014  . Essential hypertension 11/05/2014  . OSA (obstructive sleep apnea) 11/05/2014    Current Outpatient Medications:  .  allopurinol (ZYLOPRIM) 100 MG tablet, Take 0.5 tablets (50 mg total) by mouth daily., Disp: 45 tablet, Rfl: 2 .  carvedilol (COREG) 12.5 MG tablet, TAKE 1 TABLET BY MOUTH TWICE DAILY WITH A MEAL, Disp: 180 tablet, Rfl: 2 .  carvedilol (COREG) 12.5 MG tablet, Take 1 tablet (12.5 mg total) by mouth 2 (two) times daily with a meal., Disp: 60 tablet, Rfl: 11 .  colchicine 0.6 MG tablet, Take 1 tablet (0.6 mg total) by mouth daily., Disp: 30 tablet, Rfl: 5 .  fenofibrate 160 MG tablet, Take 1 tablet (160 mg total) by mouth daily., Disp: 90 tablet, Rfl: 2 .  hydrALAZINE (APRESOLINE) 50 MG tablet, TAKE 1 TABLET BY MOUTH EVERY 8 HOURS, Disp: 90 tablet, Rfl: 2 .  hydrALAZINE (APRESOLINE) 50 MG tablet, Take 1  tablet (50 mg total) by mouth every 8 (eight) hours., Disp: 90 tablet, Rfl: 11 .  omega-3 acid ethyl esters (LOVAZA) 1 g capsule, Take 2 capsules (2 g total) by mouth 2 (two) times daily., Disp: 120 capsule, Rfl: 6 .  predniSONE (DELTASONE) 10 MG tablet, 3 tabs po qd x 3 days, then 2 tabs po qd x 3 days, then 1 tab po qd x 3 days, Disp: 18 tablet, Rfl: 0 .  sacubitril-valsartan (ENTRESTO) 24-26 MG, Take 1 tablet by mouth 2 (two) times daily., Disp: 60 tablet, Rfl: 3 .  spironolactone (ALDACTONE) 25 MG tablet, Take 0.5 tablets (12.5 mg total) by mouth daily., Disp: 45 tablet, Rfl: 3 .  torsemide (DEMADEX) 20 MG tablet, Take 1 tablet (20 mg total) by mouth daily., Disp: 90 tablet, Rfl: 3 No Known Allergies    Social History   Socioeconomic History  . Marital status: Single    Spouse name: Not on file  . Number of children: 1  . Years of education: 87  . Highest education level: Not on file  Occupational History  . Occupation: Research scientist (physical sciences): [employer]  Tobacco Use  . Smoking status: Never Smoker  . Smokeless tobacco: Never Used  Substance and Sexual Activity  . Alcohol use: No  . Drug use: No  . Sexual activity: Yes    Birth control/protection: Condom  Other Topics Concern  . Not on file  Social History Narrative   Born and  raised in [place], [place]. Currently live in a private residence with his mother. Fun: Play with his daughter.   Denies religious beliefs that would effect health care.    Social Determinants of Health   Financial Resource Strain: Low Risk   . Difficulty of Paying Living Expenses: Not very hard  Food Insecurity: Food Insecurity Present  . Worried About Programme researcher, broadcasting/film/video in the Last Year: Sometimes true  . Ran Out of Food in the Last Year: Sometimes true  Transportation Needs: No Transportation Needs  . Lack of Transportation (Medical): No  . Lack of Transportation (Non-Medical): No  Physical Activity:   . Days of Exercise per Week: Not  on file  . Minutes of Exercise per Session: Not on file  Stress:   . Feeling of Stress : Not on file  Social Connections:   . Frequency of Communication with Friends and Family: Not on file  . Frequency of Social Gatherings with Friends and Family: Not on file  . Attends Religious Services: Not on file  . Active Member of Clubs or Organizations: Not on file  . Attends Banker Meetings: Not on file  . Marital Status: Not on file  Intimate Partner Violence:   . Fear of Current or Ex-Partner: Not on file  . Emotionally Abused: Not on file  . Physically Abused: Not on file  . Sexually Abused: Not on file    Physical Exam      Future Appointments  Date Time Provider Department Center  01/09/2020  1:45 PM Adonis Huguenin, NP [facility] None  01/24/2020  3:30 PM Rennis Golden Lisette Abu, MD [facility]  03/11/2020  2:00 PM Bensimhon, Bevelyn Buckles, MD [facility] None  04/10/2020  3:00 PM Corwin Levins, MD [facility] None    Mr Kitner was seen at home today and reported feeling well with the exception of moderate gout pain. He is waiting on a PA for colchicine but was started on a prednisone taper today while he was at his PCP's office. Since he had already been seen by his PCP and he was in pain, this visit was just to verify his medications and refill his pillbox. Nothing further was needed at this time. I will follow up next week.    [name], EMT 01/04/20  ACTION: Home visit completed Next visit planned for 1 week

## 2020-01-04 NOTE — Progress Notes (Signed)
Subjective:    Patient ID: Steven Chase, male    DOB: February 21, 1986, 34 y.o.   MRN: [id number]  HPI The patient is here for an acute visit.   Left lower leg pain.  The pain started three days ago in his left foot.  It was painful and swollen.  He denies any injuries.  He feels pain in his left knee at times.  He denies any left calf or lower leg pain.  He denies lesions or cuts in the foot.  He has no numbness or tingling.  He denies fevers or chills.  He has no back issues.  In the past few months he has had at least 2 episodes of gout.  His uric acid level 2 months ago was 14.  He is taking allopurinol 50 mg daily.      Medications and allergies reviewed with patient and updated if appropriate.  Patient Active Problem List   Diagnosis Date Noted  . AKI (acute kidney injury) ([place]) 11/10/2019  . Hyperkalemia 11/10/2019  . Acute exacerbation of CHF (congestive heart failure) ([place]) 07/15/2019  . Lipodystrophy 10/21/2018  . Obesity (BMI 30-39.9) 10/06/2018  . Chronic systolic heart failure ([place]) 02/17/2018  . Fatigue 02/17/2018  . Cardiomyopathy ([place]) 02/11/2018  . Hypertensive crisis 02/04/2018  . Chest pain 02/04/2018  . CKD (chronic kidney disease), stage II 02/04/2018  . Allergic rhinitis 01/21/2018  . Hyperglycemia 12/02/2017  . Hypokalemia 12/02/2017  . Cough 12/02/2017  . Patellar tendonitis of left knee 12/02/2017  . Preventative health care 12/10/2016  . Obesity 05/25/2016  . Lower leg edema 12/12/2014  . Essential hypertension 11/05/2014  . OSA (obstructive sleep apnea) 11/05/2014    Current Outpatient Medications on File Prior to Visit  Medication Sig Dispense Refill  . carvedilol (COREG) 12.5 MG tablet TAKE 1 TABLET BY MOUTH TWICE DAILY WITH A MEAL 180 tablet 2  . carvedilol (COREG) 12.5 MG tablet Take 1 tablet (12.5 mg total) by mouth 2 (two) times daily with a meal. 60 tablet 11  . fenofibrate 160 MG tablet Take 1 tablet (160 mg total) by mouth daily. 90  tablet 2  . hydrALAZINE (APRESOLINE) 50 MG tablet TAKE 1 TABLET BY MOUTH EVERY 8 HOURS 90 tablet 2  . hydrALAZINE (APRESOLINE) 50 MG tablet Take 1 tablet (50 mg total) by mouth every 8 (eight) hours. 90 tablet 11  . omega-3 acid ethyl esters (LOVAZA) 1 g capsule Take 2 capsules (2 g total) by mouth 2 (two) times daily. 120 capsule 6  . sacubitril-valsartan (ENTRESTO) 24-26 MG Take 1 tablet by mouth 2 (two) times daily. 60 tablet 3  . spironolactone (ALDACTONE) 25 MG tablet Take 0.5 tablets (12.5 mg total) by mouth daily. 45 tablet 3  . torsemide (DEMADEX) 20 MG tablet Take 1 tablet (20 mg total) by mouth daily. 90 tablet 3  . allopurinol (ZYLOPRIM) 100 MG tablet Take 0.5 tablets (50 mg total) by mouth daily. 45 tablet 2  . [DISCONTINUED] isosorbide mononitrate (IMDUR) 60 MG 24 hr tablet Take 1 tablet (60 mg total) by mouth daily. (Patient not taking: Reported on 11/21/2019) 30 tablet 3  . [DISCONTINUED] rosuvastatin (CRESTOR) 40 MG tablet Take 1 tablet (40 mg total) by mouth daily at 6 PM. (Patient not taking: Reported on 11/15/2019) 90 tablet 0   No current facility-administered medications on file prior to visit.    Past Medical History:  Diagnosis Date  . CHF (congestive heart failure) ([place])   . Hypertension   .  Obesity   . OSA (obstructive sleep apnea) 11/05/2014   02/2015 -severe -AHI 106/h     Past Surgical History:  Procedure Laterality Date  . HERNIA REPAIR    . RIGHT/LEFT HEART CATH AND CORONARY ANGIOGRAPHY N/A 02/07/2018   Procedure: RIGHT/LEFT HEART CATH AND CORONARY ANGIOGRAPHY;  Surgeon: Marykay Lex, MD;  Location: [facility] INVASIVE CV LAB;  Service: Cardiovascular;  Laterality: N/A;    Social History   Socioeconomic History  . Marital status: Single    Spouse name: Not on file  . Number of children: 1  . Years of education: 80  . Highest education level: Not on file  Occupational History  . 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 Smoking  status: Never Smoker  . Smokeless tobacco: Never Used  Substance and Sexual Activity  . Alcohol use: No  . Drug use: No  . Sexual activity: Yes    Birth control/protection: Condom  Other Topics Concern  . Not on file  Social History Narrative   Born and raised in [place], [place]. Currently live in a private residence with his mother. Fun: Play with his daughter.   Denies religious beliefs that would effect health care.    Social Determinants of Health   Financial Resource Strain: Low Risk   . Difficulty of Paying Living Expenses: Not very hard  Food Insecurity: Food Insecurity Present  . Worried About Programme researcher, broadcasting/film/video in the Last Year: Sometimes true  . Ran Out of Food in the Last Year: Sometimes true  Transportation Needs: No Transportation Needs  . Lack of Transportation (Medical): No  . Lack of Transportation (Non-Medical): No  Physical Activity:   . Days of Exercise per Week: Not on file  . Minutes of Exercise per Session: Not on file  Stress:   . Feeling of Stress : Not on file  Social Connections:   . Frequency of Communication with Friends and Family: Not on file  . Frequency of Social Gatherings with Friends and Family: Not on file  . Attends Religious Services: Not on file  . Active Member of Clubs or Organizations: Not on file  . Attends Banker Meetings: Not on file  . Marital Status: Not on file    Family History  Problem Relation Age of Onset  . Hypertension Mother   . Heart disease Maternal Grandfather   . Hypertension Maternal Grandfather   . Hypertension Sister     Review of Systems Per HPI    Objective:   Vitals:   01/04/20 0846  BP: 124/82  Pulse: (!) 108  Resp: 20  Temp: 98.4 F (36.9 C)  SpO2: 97%   BP Readings from Last 3 Encounters:  01/04/20 124/82  12/29/19 (!) 85/55  12/28/19 130/90   Wt Readings from Last 3 Encounters:  12/29/19 253 lb 6.4 oz (114.9 kg)  12/28/19 236 lb (107 kg)  12/15/19 236 lb (107 kg)    Body mass index is 40.9 kg/m.   Physical Exam Constitutional:      General: He is not in acute distress.    Appearance: Normal appearance. He is not ill-appearing (But in obvious discomfort).  HENT:     Head: Normocephalic and atraumatic.  Musculoskeletal:     Comments: Left foot is moderately swollen, slight erythema and slight warmth.  Pain with palpation, particularly in the left ankle.  Increased pain with passive and active movement of the ankle.  No pain with palpation  of foot, toes and movement of toe joints.  No left calf or lower leg pain.  Skin:    General: Skin is warm and dry.  Neurological:     Mental Status: He is alert.            Assessment & Plan:    See Problem List for Assessment and Plan of chronic medical problems.    This visit occurred during the SARS-CoV-2 public health emergency.  Safety protocols were in place, including screening questions prior to the visit, additional usage of staff PPE, and extensive cleaning of exam room while observing appropriate contact time as indicated for disinfecting solutions.

## 2020-01-04 NOTE — Patient Instructions (Addendum)
You have gout.   Continue the allopurinol daily for prevention of gout.   Start colchicine 0.6 mg daily for prevention of gout.   Start and complete the prednisone taper to treat your acute gout.       Gout  Gout is a condition that causes painful swelling of the joints. Gout is a type of inflammation of the joints (arthritis). This condition is caused by having too much uric acid in the body. Uric acid is a chemical that forms when the body breaks down substances called purines. Purines are important for building body proteins. When the body has too much uric acid, sharp crystals can form and build up inside the joints. This causes pain and swelling. Gout attacks can happen quickly and may be very painful (acute gout). Over time, the attacks can affect more joints and become more frequent (chronic gout). Gout can also cause uric acid to build up under the skin and inside the kidneys. What are the causes? This condition is caused by too much uric acid in your blood. This can happen because:  Your kidneys do not remove enough uric acid from your blood. This is the most common cause.  Your body makes too much uric acid. This can happen with some cancers and cancer treatments. It can also occur if your body is breaking down too many red blood cells (hemolytic anemia).  You eat too many foods that are high in purines. These foods include organ meats and some seafood. Alcohol, especially beer, is also high in purines. A gout attack may be triggered by trauma or stress. What increases the risk? You are more likely to develop this condition if you:  Have a family history of gout.  Are male and middle-aged.  Are male and have gone through menopause.  Are obese.  Frequently drink alcohol, especially beer.  Are dehydrated.  Lose weight too quickly.  Have an organ transplant.  Have lead poisoning.  Take certain medicines, including aspirin, cyclosporine, diuretics, levodopa, and  niacin.  Have kidney disease.  Have a skin condition called psoriasis. What are the signs or symptoms? An attack of acute gout happens quickly. It usually occurs in just one joint. The most common place is the big toe. Attacks often start at night. Other joints that may be affected include joints of the feet, ankle, knee, fingers, wrist, or elbow. Symptoms of this condition may include:  Severe pain.  Warmth.  Swelling.  Stiffness.  Tenderness. The affected joint may be very painful to touch.  Shiny, red, or purple skin.  Chills and fever. Chronic gout may cause symptoms more frequently. More joints may be involved. You may also have white or yellow lumps (tophi) on your hands or feet or in other areas near your joints. How is this diagnosed? This condition is diagnosed based on your symptoms, medical history, and physical exam. You may have tests, such as:  Blood tests to measure uric acid levels.  Removal of joint fluid with a thin needle (aspiration) to look for uric acid crystals.  X-rays to look for joint damage. How is this treated? Treatment for this condition has two phases: treating an acute attack and preventing future attacks. Acute gout treatment may include medicines to reduce pain and swelling, including:  NSAIDs.  Steroids. These are strong anti-inflammatory medicines that can be taken by mouth (orally) or injected into a joint.  Colchicine. This medicine relieves pain and swelling when it is taken soon after an attack. It  can be given by mouth or through an IV. Preventive treatment may include:  Daily use of smaller doses of NSAIDs or colchicine.  Use of a medicine that reduces uric acid levels in your blood.  Changes to your diet. You may need to see a dietitian about what to eat and drink to prevent gout. Follow these instructions at home: During a gout attack   If directed, put ice on the affected area: ? Put ice in a plastic bag. ? Place a towel  between your skin and the bag. ? Leave the ice on for 20 minutes, 2-3 times a day.  Raise (elevate) the affected joint above the level of your heart as often as possible.  Rest the joint as much as possible. If the affected joint is in your leg, you may be given crutches to use.  Follow instructions from your health care provider about eating or drinking restrictions. Avoiding future gout attacks  Follow a low-purine diet as told by your dietitian or health care provider. Avoid foods and drinks that are high in purines, including liver, kidney, anchovies, asparagus, herring, mushrooms, mussels, and beer.  Maintain a healthy weight or lose weight if you are overweight. If you want to lose weight, talk with your health care provider. It is important that you do not lose weight too quickly.  Start or maintain an exercise program as told by your health care provider. Eating and drinking  Drink enough fluids to keep your urine pale yellow.  If you drink alcohol: ? Limit how much you use to:  0-1 drink a day for women.  0-2 drinks a day for men. ? Be aware of how much alcohol is in your drink. In the [place] one drink equals one 12 oz bottle of beer (355 mL) one 5 oz glass of wine (148 mL), or one 1 oz glass of hard liquor (44 mL). General instructions  Take over-the-counter and prescription medicines only as told by your health care provider.  Do not drive or use heavy machinery while taking prescription pain medicine.  Return to your normal activities as told by your health care provider. Ask your health care provider what activities are safe for you.  Keep all follow-up visits as told by your health care provider. This is important. Contact a health care provider if you have:  Another gout attack.  Continuing symptoms of a gout attack after 10 days of treatment.  Side effects from your medicines.  Chills or a fever.  Burning pain when you urinate.  Pain in your lower back  or belly. Get help right away if you:  Have severe or uncontrolled pain.  Cannot urinate. Summary  Gout is painful swelling of the joints caused by inflammation.  The most common site of pain is the big toe, but it can affect other joints in the body.  Medicines and dietary changes can help to prevent and treat gout attacks. This information is not intended to replace advice given to you by your health care provider. Make sure you discuss any questions you have with your health care provider. Document Revised: 05/11/2018 Document Reviewed: 05/11/2018 Elsevier Patient Education  [place].

## 2020-01-04 NOTE — Assessment & Plan Note (Signed)
Acute Symptoms consistent with acute gout in left ankle Taking allopurinol 50 mg daily-continue.  Depending on kidney function this may be able to be increased.  His kidney function look like it is improving We will start colchicine 0.6 mg daily to help with prevention Prednisone taper He does have an appointment with his nephrologist later this month Call if symptoms do not improve

## 2020-01-04 NOTE — Telephone Encounter (Signed)
New message:   Pt states that the pharmacy told him he needs prior authorization for colchicine 0.6 MG tablet before the can fill this medication.

## 2020-01-05 ENCOUNTER — Telehealth: Payer: Self-pay | Admitting: Internal Medicine

## 2020-01-05 NOTE — Telephone Encounter (Signed)
Pt aware. Will have note ready on Monday.

## 2020-01-05 NOTE — Telephone Encounter (Signed)
F/u   Patient checking on the status of colchicine 0.6 MG tablet prior authorization.    If medication is not received can he get a note for work due to swelling is still present?

## 2020-01-05 NOTE — Telephone Encounter (Signed)
I looked at the [facility] Formulary and the colchicine is a tier 2 medication and a PA will not change anything.  Pt contacted and informed of same.

## 2020-01-05 NOTE — Telephone Encounter (Signed)
Carvedilol was last filled by cardiology. Okay to fill?

## 2020-01-05 NOTE — Telephone Encounter (Signed)
Yes, okay to extend until Monday

## 2020-01-05 NOTE — Telephone Encounter (Signed)
    Pt c/o medication issue:  1. Name of Medication:  carvedilol (COREG) 12.5 MG tablet and colchicine 0.6 MG tablet  2. How are you currently taking this medication (dosage and times per day)? N/A  3. Are you having a reaction (difficulty breathing--STAT)? NO  4. What is your medication issue?  [facility] calling to verify if patient should be taking both medications, call [phone]

## 2020-01-05 NOTE — Telephone Encounter (Signed)
Can alternative be sent ?

## 2020-01-05 NOTE — Telephone Encounter (Signed)
Ok with me 

## 2020-01-05 NOTE — Telephone Encounter (Signed)
Pt called back and is requesting to extend the note to be out of work. He has not been able to pick up the colchicine but will be able to tonight.   Okay to extend time off to Monday?

## 2020-01-06 NOTE — Telephone Encounter (Signed)
Pharmacy informed it was okay to dispense both the colchicine and the carvedilol.

## 2020-01-09 ENCOUNTER — Ambulatory Visit: Payer: 59 | Admitting: Family

## 2020-01-09 ENCOUNTER — Other Ambulatory Visit: Payer: Self-pay

## 2020-01-09 ENCOUNTER — Encounter: Payer: Self-pay | Admitting: Family

## 2020-01-09 VITALS — Ht 66.0 in | Wt 253.0 lb

## 2020-01-09 DIAGNOSIS — M6702 Short Achilles tendon (acquired), left ankle: Secondary | ICD-10-CM | POA: Diagnosis not present

## 2020-01-09 DIAGNOSIS — M353 Polymyalgia rheumatica: Secondary | ICD-10-CM

## 2020-01-09 DIAGNOSIS — M2022 Hallux rigidus, left foot: Secondary | ICD-10-CM

## 2020-01-09 DIAGNOSIS — M1A0720 Idiopathic chronic gout, left ankle and foot, without tophus (tophi): Secondary | ICD-10-CM

## 2020-01-09 NOTE — Progress Notes (Signed)
Office Visit Note   Patient: Steven Chase           Date of Birth: 11-Mar-1986           MRN: [id number] Visit Date: 01/09/2020              Requested by: Corwin Levins, MD [address] [place],  [place] [address] PCP: Corwin Levins, MD  Chief Complaint  Patient presents with  . Left Foot - Pain      HPI: Patient is a 34 year old gentleman who was seen in initial evaluation for left foot pain.  He states he has had pain for years and has had increased swelling over the last few days.  Patient states that on January 10 his uric acid was 14.0 he states he is currently taking a half a tablet of allopurinol a day and taking a full tablet of colchicine a day.  Patient states that he has had to miss work several times due to flareups of gout.  Patient states that he does not eat a lot of meat or seafood but does eat a lot of tomato based products.  Assessment & Plan: Visit Diagnoses: No diagnosis found.  Plan: Recommended avoiding the tomato based products a uric acid was drawn again today we will call with the results and adjust his medications.  Follow-Up Instructions: No follow-ups on file.   Ortho Exam  Patient is alert, oriented, no adenopathy, well-dressed, normal affect, normal respiratory effort. Examination patient has a good dorsalis pedis pulse he does have significant Achilles contracture with dorsiflexion about 20 degrees short of neutral.  Patient also has significant hallux rigidus of the left great toe with dorsiflexion only about 20 degrees plantar flexion of 10 degrees.  Review of the radiographs shows no periarticular cystic changes the joint spaces are congruent.  Imaging: No results found. No images are attached to the encounter.  Labs: Lab Results  Component Value Date   HGBA1C 6.6 (A) 10/11/2019   HGBA1C 6.0 (H) 07/19/2019   HGBA1C 5.6 10/06/2018   HGBA1C 0 10/06/2018   HGBA1C 0 (A) 10/06/2018   HGBA1C 0.0 10/06/2018   ESRSEDRATE 2 07/16/2019   LABURIC 14.0 (H) 11/12/2019   REPTSTATUS 11/30/2017 FINAL 11/28/2017   CULT FEW STREPTOCOCCUS,BETA HEMOLYTIC NOT GROUP A 11/28/2017     Lab Results  Component Value Date   ALBUMIN 4.2 11/11/2019   ALBUMIN 4.3 11/10/2019   ALBUMIN 3.4 (L) 07/15/2019   LABURIC 14.0 (H) 11/12/2019    Lab Results  Component Value Date   MG 2.0 07/18/2019   MG 2.2 07/17/2019   MG 1.9 07/16/2019   No results found for: VD25OH  No results found for: PREALBUMIN CBC EXTENDED Latest Ref Rng & Units 11/13/2019 11/12/2019 11/12/2019  WBC 4.0 - 10.5 K/uL 11.7(H) 11.0(H) -  RBC 4.22 - 5.81 MIL/uL 3.86(L) 4.03(L) 4.00(L)  HGB 13.0 - 17.0 g/dL 10.4(L) 10.8(L) -  HCT 39.0 - 52.0 % 30.2(L) 31.9(L) -  PLT 150 - 400 K/uL 271 291 -  NEUTROABS 1.7 - 7.7 K/uL 7.1 7.5 -  LYMPHSABS 0.7 - 4.0 K/uL 2.8 2.1 -     Body mass index is 40.84 kg/m.  Orders:  No orders of the defined types were placed in this encounter.  No orders of the defined types were placed in this encounter.    Procedures: No procedures performed  Clinical Data: No additional findings.  ROS:  All other systems negative, except as noted in the  HPI. Review of Systems  Objective: Vital Signs: Ht 5\' 6"  (1.676 m)   Wt 253 lb (114.8 kg)   BMI 40.84 kg/m   Specialty Comments:  No specialty comments available.  PMFS History: Patient Active Problem List   Diagnosis Date Noted  . Acute gout of left ankle 01/04/2020  . AKI (acute kidney injury) ([facility]) 11/10/2019  . Hyperkalemia 11/10/2019  . Acute exacerbation of CHF (congestive heart failure) ([facility]) 07/15/2019  . Lipodystrophy 10/21/2018  . Obesity (BMI 30-39.9) 10/06/2018  . Chronic systolic heart failure ([facility]) 02/17/2018  . Fatigue 02/17/2018  . Cardiomyopathy ([facility]) 02/11/2018  . Hypertensive crisis 02/04/2018  . Chest pain 02/04/2018  . CKD (chronic kidney disease), stage II 02/04/2018  . Allergic rhinitis 01/21/2018  . Hyperglycemia 12/02/2017  . Hypokalemia 12/02/2017  .  Cough 12/02/2017  . Patellar tendonitis of left knee 12/02/2017  . Preventative health care 12/10/2016  . Obesity 05/25/2016  . Lower leg edema 12/12/2014  . Essential hypertension 11/05/2014  . OSA (obstructive sleep apnea) 11/05/2014   Past Medical History:  Diagnosis Date  . CHF (congestive heart failure) ([facility])   . Hypertension   . Obesity   . OSA (obstructive sleep apnea) 11/05/2014   02/2015 -severe -AHI 106/h     Family History  Problem Relation Age of Onset  . Hypertension Mother   . Heart disease Maternal Grandfather   . Hypertension Maternal Grandfather   . Hypertension Sister     Past Surgical History:  Procedure Laterality Date  . HERNIA REPAIR    . RIGHT/LEFT HEART CATH AND CORONARY ANGIOGRAPHY N/A 02/07/2018   Procedure: RIGHT/LEFT HEART CATH AND CORONARY ANGIOGRAPHY;  Surgeon: Leonie Man, MD;  Location: [facility];  Service: Cardiovascular;  Laterality: N/A;   Social History   Occupational History  . Occupation: Research scientist (physical sciences): [employer]  Tobacco Use  . Smoking status: Never Smoker  . Smokeless tobacco: Never Used  Substance and Sexual Activity  . Alcohol use: No  . Drug use: No  . Sexual activity: Yes    Birth control/protection: Condom

## 2020-01-10 LAB — URIC ACID: Uric Acid, Serum: 7 mg/dL (ref 4.0–8.0)

## 2020-01-11 ENCOUNTER — Ambulatory Visit: Payer: 59 | Admitting: Internal Medicine

## 2020-01-11 ENCOUNTER — Other Ambulatory Visit (HOSPITAL_COMMUNITY): Payer: Self-pay

## 2020-01-11 DIAGNOSIS — Z0289 Encounter for other administrative examinations: Secondary | ICD-10-CM

## 2020-01-11 IMAGING — DX DG KNEE COMPLETE 4+V*L*
4 series · 4 of 4 positions shown · non-contrast
Comparison: None.

CLINICAL DATA: Pain

EXAM:
LEFT KNEE - COMPLETE 4+ VIEW

[knee ap]
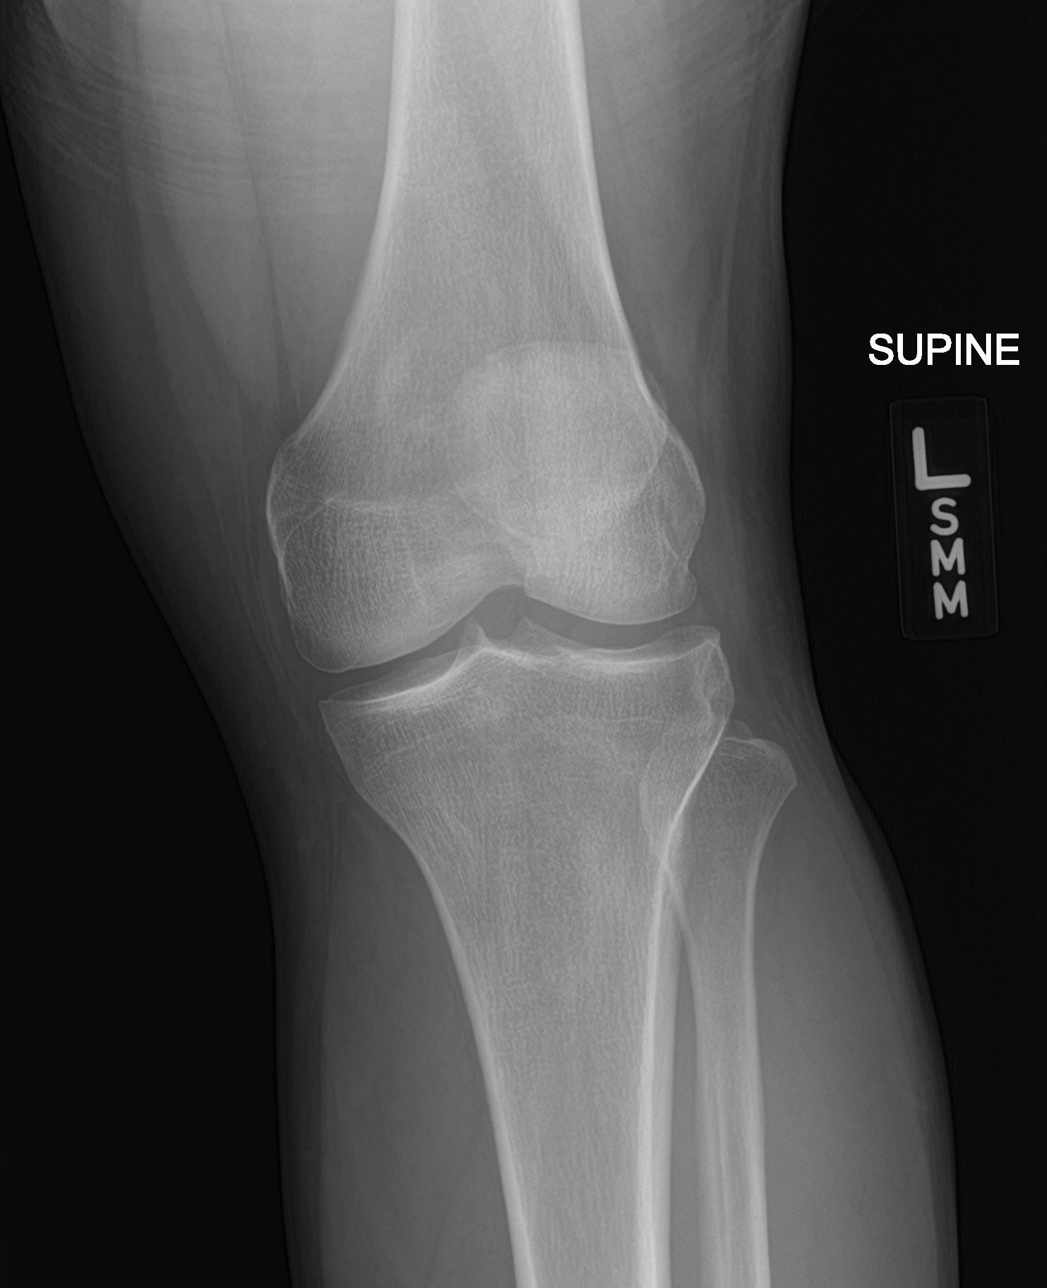

[knee obl (1 of 2)]
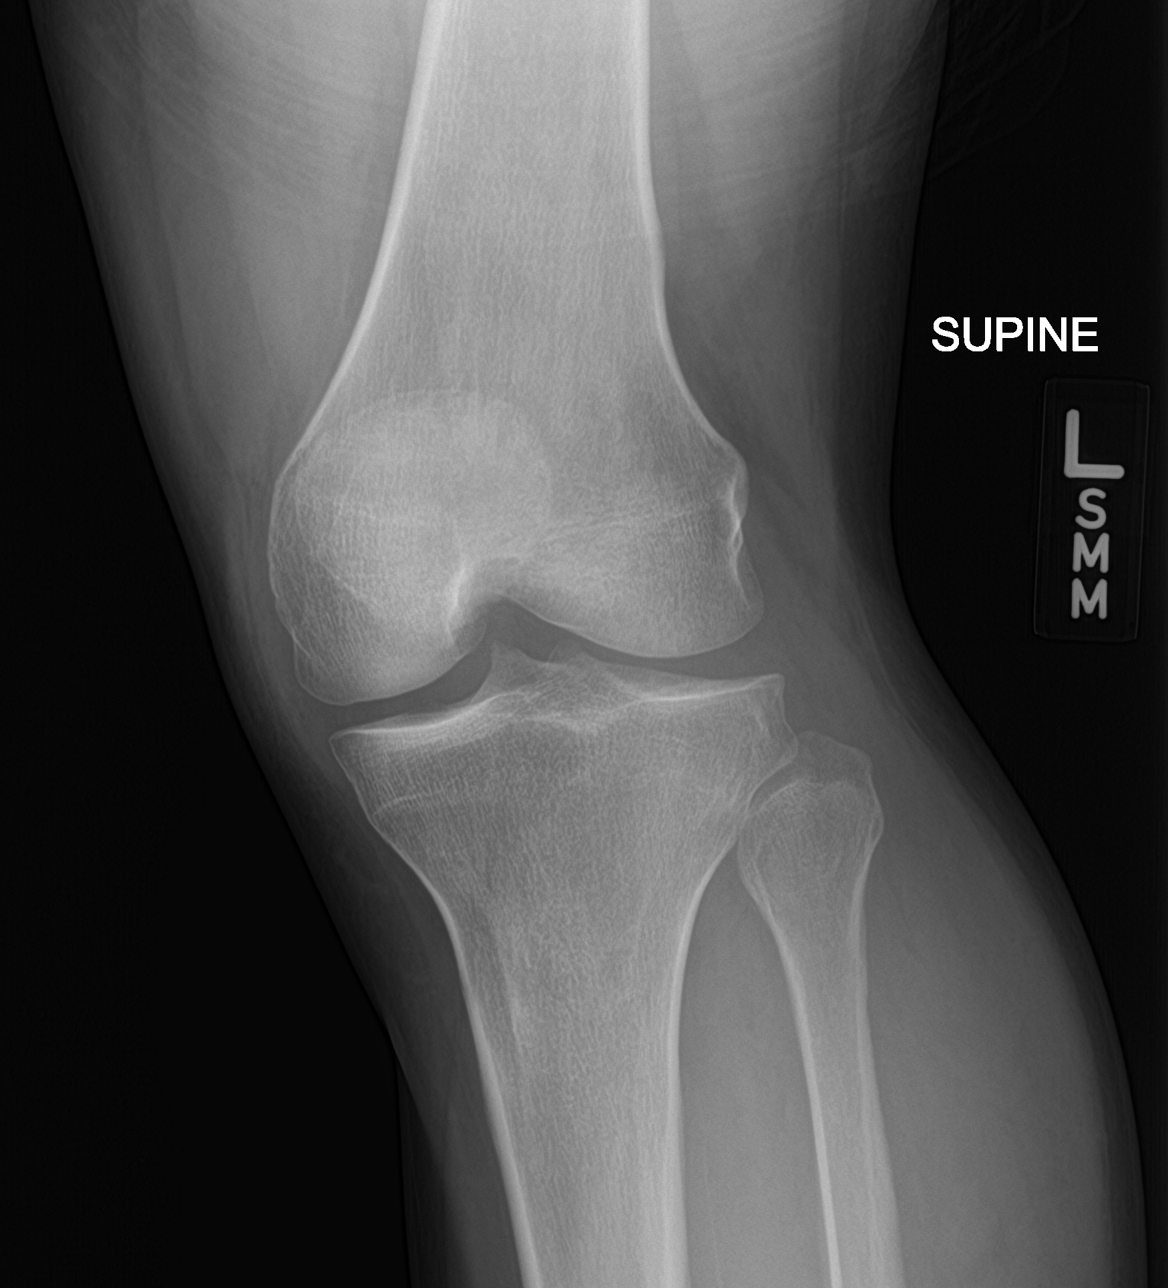

[knee obl (2 of 2)]
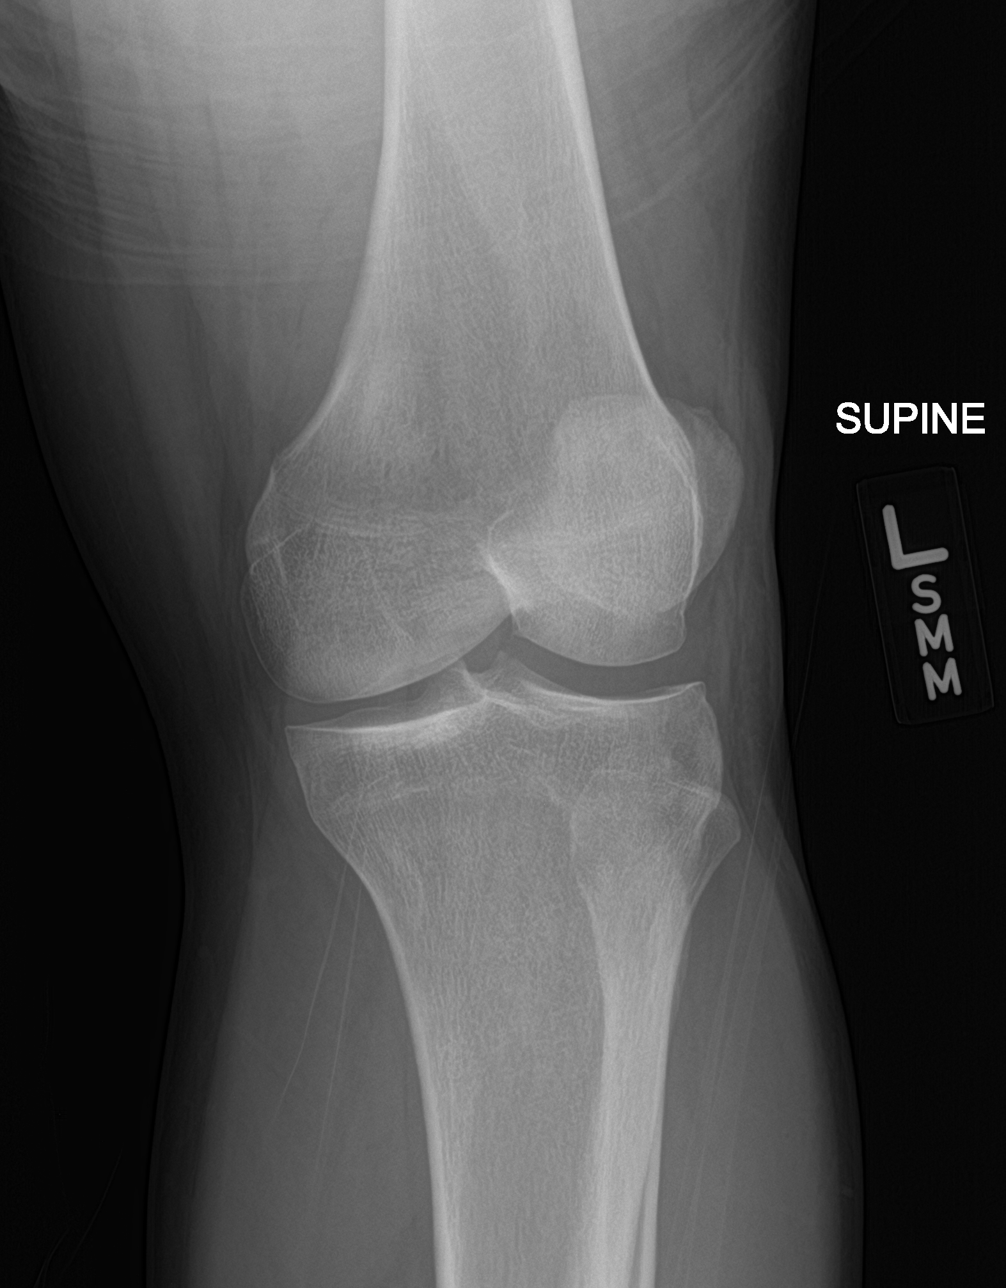

[knee lat]
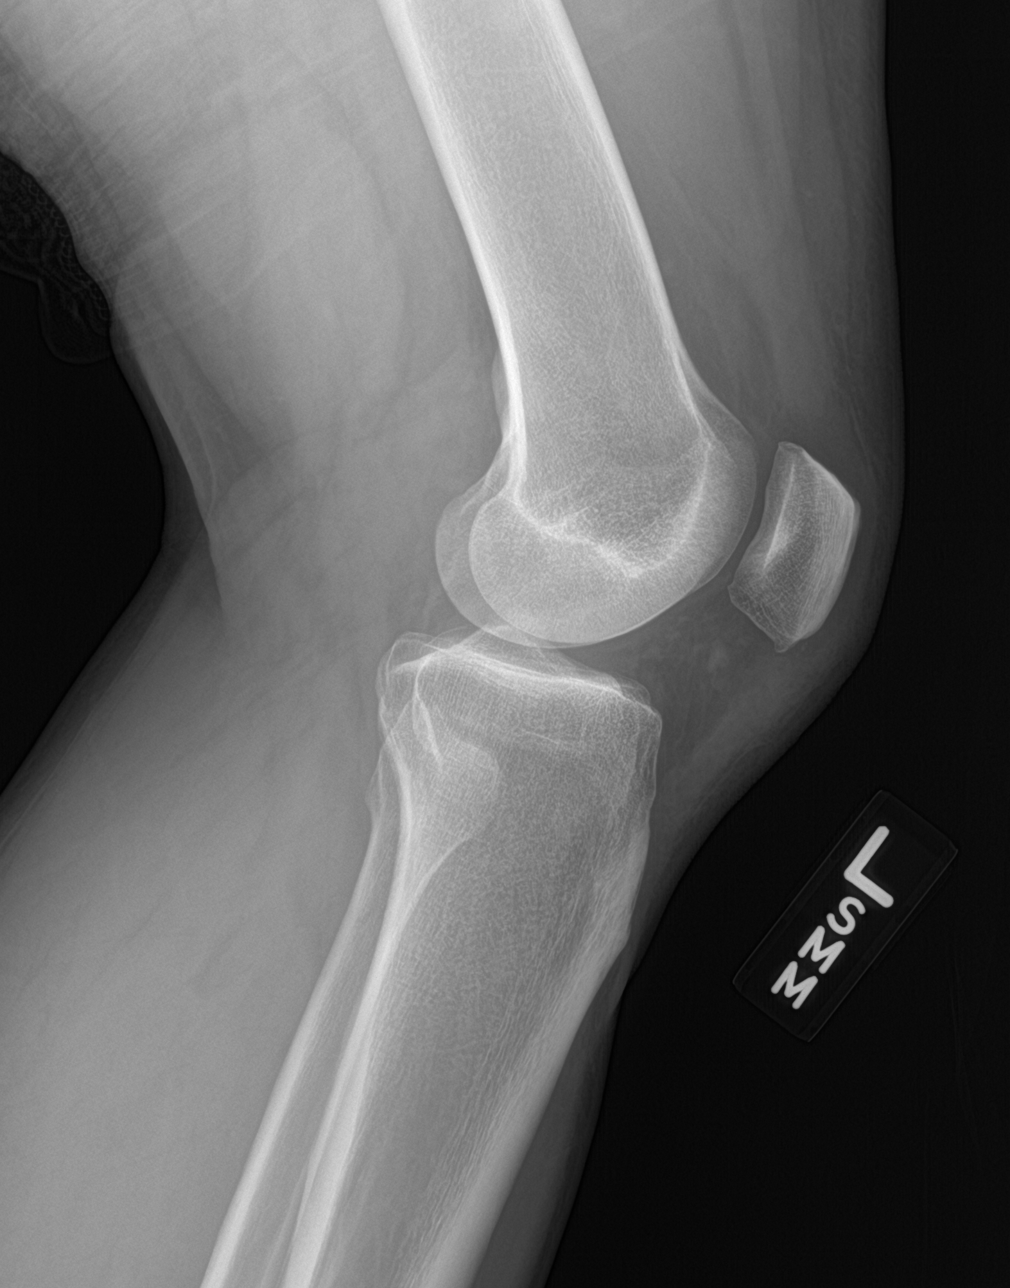

[4 of 4 positions shown; findings below may reference images not displayed]

FINDINGS: No evidence of fracture, dislocation, or joint effusion. No evidence
of arthropathy or other focal bone abnormality. Soft tissues are
unremarkable. There is a small suprapatellar joint effusion.
IMPRESSION: Small suprapatellar joint effusion. No acute bony abnormality.

## 2020-01-11 NOTE — Progress Notes (Signed)
Paramedicine Encounter    Patient ID: Steven Chase, male    DOB: September 29, 1986, 34 y.o.   MRN: [id number]   Patient Care Team: Corwin Levins, MD as PCP - General (Internal Medicine) Nahser, Deloris Ping, MD as PCP - Cardiology (Cardiology) Burna Sis, LCSW as Social Worker (Licensed Clinical Social Worker)  Patient Active Problem List   Diagnosis Date Noted  . Acute gout of left ankle 01/04/2020  . AKI (acute kidney injury) ([facility]) 11/10/2019  . Hyperkalemia 11/10/2019  . Acute exacerbation of CHF (congestive heart failure) ([facility]) 07/15/2019  . Lipodystrophy 10/21/2018  . Obesity (BMI 30-39.9) 10/06/2018  . Chronic systolic heart failure ([facility]) 02/17/2018  . Fatigue 02/17/2018  . Cardiomyopathy ([facility]) 02/11/2018  . Hypertensive crisis 02/04/2018  . Chest pain 02/04/2018  . CKD (chronic kidney disease), stage II 02/04/2018  . Allergic rhinitis 01/21/2018  . Hyperglycemia 12/02/2017  . Hypokalemia 12/02/2017  . Cough 12/02/2017  . Patellar tendonitis of left knee 12/02/2017  . Preventative health care 12/10/2016  . Obesity 05/25/2016  . Lower leg edema 12/12/2014  . Essential hypertension 11/05/2014  . OSA (obstructive sleep apnea) 11/05/2014    Current Outpatient Medications:  .  allopurinol (ZYLOPRIM) 100 MG tablet, Take 0.5 tablets (50 mg total) by mouth daily., Disp: 45 tablet, Rfl: 2 .  carvedilol (COREG) 12.5 MG tablet, TAKE 1 TABLET BY MOUTH TWICE DAILY WITH A MEAL, Disp: 180 tablet, Rfl: 2 .  colchicine 0.6 MG tablet, Take 1 tablet (0.6 mg total) by mouth daily., Disp: 30 tablet, Rfl: 5 .  fenofibrate 160 MG tablet, Take 1 tablet (160 mg total) by mouth daily., Disp: 90 tablet, Rfl: 2 .  hydrALAZINE (APRESOLINE) 50 MG tablet, TAKE 1 TABLET BY MOUTH EVERY 8 HOURS, Disp: 90 tablet, Rfl: 2 .  omega-3 acid ethyl esters (LOVAZA) 1 g capsule, Take 2 capsules (2 g total) by mouth 2 (two) times daily., Disp: 120 capsule, Rfl: 6 .  predniSONE (DELTASONE) 10 MG tablet, 3 tabs po qd x  3 days, then 2 tabs po qd x 3 days, then 1 tab po qd x 3 days, Disp: 18 tablet, Rfl: 0 .  sacubitril-valsartan (ENTRESTO) 24-26 MG, Take 1 tablet by mouth 2 (two) times daily., Disp: 60 tablet, Rfl: 3 .  spironolactone (ALDACTONE) 25 MG tablet, Take 0.5 tablets (12.5 mg total) by mouth daily., Disp: 45 tablet, Rfl: 3 .  torsemide (DEMADEX) 20 MG tablet, Take 1 tablet (20 mg total) by mouth daily., Disp: 90 tablet, Rfl: 3 .  hydrALAZINE (APRESOLINE) 50 MG tablet, Take 1 tablet (50 mg total) by mouth every 8 (eight) hours., Disp: 90 tablet, Rfl: 11 No Known Allergies    Social History   Socioeconomic History  . Marital status: Single    Spouse name: Not on file  . Number of children: 1  . Years of education: 68  . Highest education level: Not on file  Occupational History  . 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 Smoking status: Never Smoker  . Smokeless tobacco: Never Used  Substance and Sexual Activity  . Alcohol use: No  . Drug use: No  . Sexual activity: Yes    Birth control/protection: Condom  Other Topics Concern  . Not on file  Social History Narrative   Born and raised in [place], [place]. Currently live in a private residence with his mother. Fun: Play with his daughter.   Denies religious beliefs that would effect health  care.    Social Determinants of Health   Financial Resource Strain: Low Risk   . Difficulty of Paying Living Expenses: Not very hard  Food Insecurity: Food Insecurity Present  . Worried About Charity fundraiser in the Last Year: Sometimes true  . Ran Out of Food in the Last Year: Sometimes true  Transportation Needs: No Transportation Needs  . Lack of Transportation (Medical): No  . Lack of Transportation (Non-Medical): No  Physical Activity:   . Days of Exercise per Week:   . Minutes of Exercise per Session:   Stress:   . Feeling of Stress :   Social Connections:   . Frequency of Communication with Friends and  Family:   . Frequency of Social Gatherings with Friends and Family:   . Attends Religious Services:   . Active Member of Clubs or Organizations:   . Attends Archivist Meetings:   Marland Kitchen Marital Status:   Intimate Partner Violence:   . Fear of Current or Ex-Partner:   . Emotionally Abused:   Marland Kitchen Physically Abused:   . Sexually Abused:     Physical Exam Cardiovascular:     Rate and Rhythm: Normal rate and regular rhythm.     Pulses: Normal pulses.  Pulmonary:     Effort: Pulmonary effort is normal.     Breath sounds: Normal breath sounds.  Musculoskeletal:        General: Normal range of motion.     Right lower leg: No edema.     Left lower leg: No edema.  Skin:    General: Skin is warm and dry.     Capillary Refill: Capillary refill takes less than 2 seconds.  Neurological:     Mental Status: He is oriented to person, place, and time.  Psychiatric:        Mood and Affect: Mood normal.         Future Appointments  Date Time Provider [place]  01/11/2020 11:20 AM Biagio Borg, MD [facility] None  01/24/2020  3:30 PM Pixie Casino, MD [facility]  03/11/2020  2:00 PM Bensimhon, Shaune Pascal, MD [facility] None  04/10/2020  3:00 PM Biagio Borg, MD [facility] None    BP (!) 180/130 (BP Location: Right Arm, Patient Position: Sitting, Cuff Size: Normal)   Pulse 92   Resp 16   Wt 237 lb (107.5 kg)   SpO2 94%   BMI 38.25 kg/m   Weight yesterday- did not weigh Last visit weight- 235 lb  Steven Chase was seen at home today and reported feeling well. He denied chest pain, SOB, headache, dizziness, orthopnea, fever or cough in the past week. He reported being compliant with his medications but he had not taken them today and his weight remains stable. He was markedly hypertensive but since he had not had his medications this morning I advised that he should take it tomorrow morning early and I will return to check his vital signs. His medications were verified and his  pillbox was refilled I will see him tomorrow.   Jacquiline Doe, EMT 01/11/20  ACTION: Home visit completed Next visit planned for tomorrow morning

## 2020-01-12 ENCOUNTER — Telehealth: Payer: Self-pay | Admitting: Orthopedic Surgery

## 2020-01-12 ENCOUNTER — Other Ambulatory Visit (HOSPITAL_COMMUNITY): Payer: Self-pay

## 2020-01-12 ENCOUNTER — Other Ambulatory Visit: Payer: Self-pay

## 2020-01-12 ENCOUNTER — Telehealth (HOSPITAL_COMMUNITY): Payer: Self-pay

## 2020-01-12 MED ORDER — ALLOPURINOL 100 MG PO TABS: 100.0000 mg | ORAL_TABLET | Freq: Two times a day (BID) | ORAL | 3 refills | Status: DC

## 2020-01-12 MED ORDER — HYDRALAZINE HCL 100 MG PO TABS: 100.0000 mg | ORAL_TABLET | Freq: Three times a day (TID) | ORAL | 11 refills | Status: AC

## 2020-01-12 NOTE — Telephone Encounter (Signed)
Steven Chase of paramedicine out to see patient today noted patient is hypertensive despite taking his medications this morning. He was 160/120 today.  Yesterday he was 180/130 however he did not take his medications.  Pt has no complaints at all. Weight stable at 237 lb. Please advise.

## 2020-01-12 NOTE — Progress Notes (Signed)
Paramedicine Encounter    Patient ID: Steven Chase, male    DOB: 06-24-1986, 34 y.o.   MRN: [id number]   Patient Care Team: Steven Levins, MD as PCP - General (Internal Medicine) Nahser, Deloris Ping, MD as PCP - Cardiology (Cardiology) Steven Sis, LCSW as Social Worker (Licensed Clinical Social Worker)  Patient Active Problem List   Diagnosis Date Noted  . Acute gout of left ankle 01/04/2020  . AKI (acute kidney injury) ([facility]) 11/10/2019  . Hyperkalemia 11/10/2019  . Acute exacerbation of CHF (congestive heart failure) ([facility]) 07/15/2019  . Lipodystrophy 10/21/2018  . Obesity (BMI 30-39.9) 10/06/2018  . Chronic systolic heart failure ([facility]) 02/17/2018  . Fatigue 02/17/2018  . Cardiomyopathy ([facility]) 02/11/2018  . Hypertensive crisis 02/04/2018  . Chest pain 02/04/2018  . CKD (chronic kidney disease), stage II 02/04/2018  . Allergic rhinitis 01/21/2018  . Hyperglycemia 12/02/2017  . Hypokalemia 12/02/2017  . Cough 12/02/2017  . Patellar tendonitis of left knee 12/02/2017  . Preventative health care 12/10/2016  . Obesity 05/25/2016  . Lower leg edema 12/12/2014  . Essential hypertension 11/05/2014  . OSA (obstructive sleep apnea) 11/05/2014    Current Outpatient Medications:  .  allopurinol (ZYLOPRIM) 100 MG tablet, Take 0.5 tablets (50 mg total) by mouth daily., Disp: 45 tablet, Rfl: 2 .  carvedilol (COREG) 12.5 MG tablet, TAKE 1 TABLET BY MOUTH TWICE DAILY WITH A MEAL, Disp: 180 tablet, Rfl: 2 .  colchicine 0.6 MG tablet, Take 1 tablet (0.6 mg total) by mouth daily., Disp: 30 tablet, Rfl: 5 .  fenofibrate 160 MG tablet, Take 1 tablet (160 mg total) by mouth daily., Disp: 90 tablet, Rfl: 2 .  hydrALAZINE (APRESOLINE) 50 MG tablet, TAKE 1 TABLET BY MOUTH EVERY 8 HOURS, Disp: 90 tablet, Rfl: 2 .  hydrALAZINE (APRESOLINE) 50 MG tablet, Take 1 tablet (50 mg total) by mouth every 8 (eight) hours., Disp: 90 tablet, Rfl: 11 .  omega-3 acid ethyl esters (LOVAZA) 1 g capsule, Take 2  capsules (2 g total) by mouth 2 (two) times daily., Disp: 120 capsule, Rfl: 6 .  predniSONE (DELTASONE) 10 MG tablet, 3 tabs po qd x 3 days, then 2 tabs po qd x 3 days, then 1 tab po qd x 3 days, Disp: 18 tablet, Rfl: 0 .  sacubitril-valsartan (ENTRESTO) 24-26 MG, Take 1 tablet by mouth 2 (two) times daily., Disp: 60 tablet, Rfl: 3 .  spironolactone (ALDACTONE) 25 MG tablet, Take 0.5 tablets (12.5 mg total) by mouth daily., Disp: 45 tablet, Rfl: 3 .  torsemide (DEMADEX) 20 MG tablet, Take 1 tablet (20 mg total) by mouth daily., Disp: 90 tablet, Rfl: 3 No Known Allergies    Social History   Socioeconomic History  . Marital status: Single    Spouse name: Not on file  . Number of children: 1  . Years of education: 11  . Highest education level: Not on file  Occupational History  . 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 Smoking status: Never Smoker  . Smokeless tobacco: Never Used  Substance and Sexual Activity  . Alcohol use: No  . Drug use: No  . Sexual activity: Yes    Birth control/protection: Condom  Other Topics Concern  . Not on file  Social History Narrative   Born and raised in [place], [place]. Currently live in a private residence with his mother. Fun: Play with his daughter.   Denies religious beliefs that would effect health  care.    Social Determinants of Health   Financial Resource Strain: Low Risk   . Difficulty of Paying Living Expenses: Not very hard  Food Insecurity: Food Insecurity Present  . Worried About Charity fundraiser in the Last Year: Sometimes true  . Ran Out of Food in the Last Year: Sometimes true  Transportation Needs: No Transportation Needs  . Lack of Transportation (Medical): No  . Lack of Transportation (Non-Medical): No  Physical Activity:   . Days of Exercise per Week:   . Minutes of Exercise per Session:   Stress:   . Feeling of Stress :   Social Connections:   . Frequency of Communication with Friends  and Family:   . Frequency of Social Gatherings with Friends and Family:   . Attends Religious Services:   . Active Member of Clubs or Organizations:   . Attends Archivist Meetings:   Marland Kitchen Marital Status:   Intimate Partner Violence:   . Fear of Current or Ex-Partner:   . Emotionally Abused:   Marland Kitchen Physically Abused:   . Sexually Abused:     Physical Exam Cardiovascular:     Rate and Rhythm: Normal rate and regular rhythm.     Pulses: Normal pulses.  Pulmonary:     Effort: Pulmonary effort is normal.     Breath sounds: Normal breath sounds.  Abdominal:     General: There is no distension.  Musculoskeletal:        General: Normal range of motion.     Right lower leg: No edema.     Left lower leg: No edema.  Skin:    General: Skin is warm and dry.     Capillary Refill: Capillary refill takes less than 2 seconds.  Neurological:     Mental Status: He is alert and oriented to person, place, and time.  Psychiatric:        Mood and Affect: Mood normal.         Future Appointments  Date Time Provider [place]  01/24/2020  3:30 PM Steven Casino, MD [facility]  03/11/2020  2:00 PM Bensimhon, Shaune Pascal, MD [facility] None  04/10/2020  3:00 PM Steven Borg, MD [facility] None    BP (!) 160/120 (BP Location: Left Arm, Patient Position: Sitting, Cuff Size: Normal)   Pulse 83   Resp 16   Wt 237 lb (107.5 kg)   SpO2 96%   BMI 38.25 kg/m   Weight yesterday- 237 lb Last visit weight- n/a  Steven Chase was seen at home to reconcile his medications following a doctors appointment yesterday for gout. Additionally, he was significantly hypertensive yesterday but had not taken his medications. Today he reported taking his medications around 06:00 but he remains hypertensive. I contacted the HF clinic and was advised to increase hydralazine to 100 mg TID. The necessary changes were made and I will follow up next week.   Steven Chase, EMT 01/12/20  ACTION: Home  visit completed Next visit planned for 1 week

## 2020-01-12 NOTE — Telephone Encounter (Signed)
D/w Dr Gala Romney in office, after review of medication list pt is to increase Hydralazine to 100mg  every 8 hours. made aware of changes. He will makes changes for patient and communicate with him.

## 2020-01-12 NOTE — Telephone Encounter (Signed)
Rx updated to allopurinol 100 mg bid and faxed to pharm.

## 2020-01-12 NOTE — Telephone Encounter (Signed)
Pt called in stating Dr. Lajoyce Corners recently changed the dosage of his medication and his [facility] pharmacy on [address] hasn't received the updated prescription and he was asking if we could get that sent in.   [phone]

## 2020-01-16 ENCOUNTER — Telehealth (HOSPITAL_COMMUNITY): Payer: Self-pay | Admitting: Licensed Clinical Social Worker

## 2020-01-16 NOTE — Telephone Encounter (Signed)
CSW called pt to see if they have received or been scheduled to receive the COVID-19 vaccine at this time.  Pt has not received vaccine but is unsure at this time if he would like to receive it based on what he has heard about people feeling poorly following the vaccine.  Pt will think on it and request CSW call him back tomorrow when he is off work.  Burna Sis, LCSW Clinical Social Worker [facility] Desk#: [phone] Cell#: [phone]

## 2020-01-17 ENCOUNTER — Telehealth (HOSPITAL_COMMUNITY): Payer: Self-pay | Admitting: Licensed Clinical Social Worker

## 2020-01-17 NOTE — Telephone Encounter (Signed)
CSW received call back from pt this morning reporting he would like the vaccine and requesting help setting up appt.  CSW made pt appt with [facility] location for 3/24 at 9am and sent pt appt information  CSW will continue to follow and assist as needed  Burna Sis, LCSW Clinical Social Worker [facility] Desk#: [phone] Cell#: [phone]

## 2020-01-18 ENCOUNTER — Telehealth (HOSPITAL_COMMUNITY): Payer: Self-pay

## 2020-01-18 NOTE — Telephone Encounter (Signed)
I called Steven Chase to schedule an appointment. He stated he has to run errands today and is not able to meet. He stated he would be able to meet tomorrow afternoon instead so we agreed to meet at 13:00.  [name], EMT 01/18/20

## 2020-01-19 ENCOUNTER — Other Ambulatory Visit (HOSPITAL_COMMUNITY): Payer: Self-pay

## 2020-01-19 NOTE — Progress Notes (Signed)
Paramedicine Encounter    Patient ID: Steven Chase, male    DOB: 06-Dec-1985, 33 y.o.   MRN: [id number]   Patient Care Team: Steven Levins, MD as PCP - General (Internal Medicine) Nahser, Deloris Ping, MD as PCP - Cardiology (Cardiology) Steven Sis, LCSW as Social Worker (Licensed Clinical Social Worker)  Patient Active Problem List   Diagnosis Date Noted  . Acute gout of left ankle 01/04/2020  . AKI (acute kidney injury) (HCC) 11/10/2019  . Hyperkalemia 11/10/2019  . Acute exacerbation of CHF (congestive heart failure) (HCC) 07/15/2019  . Lipodystrophy 10/21/2018  . Obesity (BMI 30-39.9) 10/06/2018  . Chronic systolic heart failure (HCC) 02/17/2018  . Fatigue 02/17/2018  . Cardiomyopathy (HCC) 02/11/2018  . Hypertensive crisis 02/04/2018  . Chest pain 02/04/2018  . CKD (chronic kidney disease), stage II 02/04/2018  . Allergic rhinitis 01/21/2018  . Hyperglycemia 12/02/2017  . Hypokalemia 12/02/2017  . Cough 12/02/2017  . Patellar tendonitis of left knee 12/02/2017  . Preventative health care 12/10/2016  . Obesity 05/25/2016  . Lower leg edema 12/12/2014  . Essential hypertension 11/05/2014  . OSA (obstructive sleep apnea) 11/05/2014    Current Outpatient Medications:  .  allopurinol (ZYLOPRIM) 100 MG tablet, Take 1 tablet (100 mg total) by mouth 2 (two) times daily., Disp: 60 tablet, Rfl: 3 .  carvedilol (COREG) 12.5 MG tablet, TAKE 1 TABLET BY MOUTH TWICE DAILY WITH A MEAL, Disp: 180 tablet, Rfl: 2 .  fenofibrate 160 MG tablet, Take 1 tablet (160 mg total) by mouth daily., Disp: 90 tablet, Rfl: 2 .  hydrALAZINE (APRESOLINE) 100 MG tablet, Take 1 tablet (100 mg total) by mouth every 8 (eight) hours., Disp: 90 tablet, Rfl: 11 .  omega-3 acid ethyl esters (LOVAZA) 1 g capsule, Take 2 capsules (2 g total) by mouth 2 (two) times daily., Disp: 120 capsule, Rfl: 6 .  sacubitril-valsartan (ENTRESTO) 24-26 MG, Take 1 tablet by mouth 2 (two) times daily., Disp: 60 tablet, Rfl: 3 .   spironolactone (ALDACTONE) 25 MG tablet, Take 0.5 tablets (12.5 mg total) by mouth daily., Disp: 45 tablet, Rfl: 3 .  torsemide (DEMADEX) 20 MG tablet, Take 1 tablet (20 mg total) by mouth daily., Disp: 90 tablet, Rfl: 3 .  colchicine 0.6 MG tablet, Take 1 tablet (0.6 mg total) by mouth daily. (Patient not taking: Reported on 01/19/2020), Disp: 30 tablet, Rfl: 5 .  predniSONE (DELTASONE) 10 MG tablet, 3 tabs po qd x 3 days, then 2 tabs po qd x 3 days, then 1 tab po qd x 3 days (Patient not taking: Reported on 01/19/2020), Disp: 18 tablet, Rfl: 0 No Known Allergies    Social History   Socioeconomic History  . Marital status: Single    Spouse name: Not on file  . Number of children: 1  . Years of education: 25  . Highest education level: Not on file  Occupational History  . 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 Smoking status: Never Smoker  . Smokeless tobacco: Never Used  Substance and Sexual Activity  . Alcohol use: No  . Drug use: No  . Sexual activity: Yes    Birth control/protection: Condom  Other Topics Concern  . Not on file  Social History Narrative   Born and raised in [place], [place]. Currently live in a private residence with his mother. Fun: Play with his daughter.   Denies religious beliefs that would effect health care.    Social  Determinants of Health   Financial Resource Strain: Low Risk   . Difficulty of Paying Living Expenses: Not very hard  Food Insecurity: Food Insecurity Present  . Worried About Charity fundraiser in the Last Year: Sometimes true  . Ran Out of Food in the Last Year: Sometimes true  Transportation Needs: No Transportation Needs  . Lack of Transportation (Medical): No  . Lack of Transportation (Non-Medical): No  Physical Activity:   . Days of Exercise per Week:   . Minutes of Exercise per Session:   Stress:   . Feeling of Stress :   Social Connections:   . Frequency of Communication with Friends and  Family:   . Frequency of Social Gatherings with Friends and Family:   . Attends Religious Services:   . Active Member of Clubs or Organizations:   . Attends Archivist Meetings:   Steven Chase Kitchen Marital Status:   Intimate Partner Violence:   . Fear of Current or Ex-Partner:   . Emotionally Abused:   Steven Chase Kitchen Physically Abused:   . Sexually Abused:     Physical Exam Cardiovascular:     Rate and Rhythm: Normal rate and regular rhythm.     Pulses: Normal pulses.  Pulmonary:     Effort: Pulmonary effort is normal.     Breath sounds: Normal breath sounds.  Musculoskeletal:        General: Normal range of motion.     Right lower leg: Edema present.     Left lower leg: Edema present.  Skin:    General: Skin is warm and dry.     Capillary Refill: Capillary refill takes less than 2 seconds.  Neurological:     Mental Status: He is alert and oriented to person, place, and time.  Psychiatric:        Mood and Affect: Mood normal.         Future Appointments  Date Time Provider [place]  01/24/2020  3:30 PM Steven Casino, MD [facility]  03/11/2020  2:00 PM Bensimhon, Shaune Pascal, MD [facility] None  04/10/2020  3:00 PM Steven Borg, MD [facility] None    BP (!) 144/106 (BP Location: Left Arm, Patient Position: Sitting, Cuff Size: Normal)   Pulse 90   Resp 16   Wt 236 lb (107 kg)   SpO2 97%   BMI 38.09 kg/m   Weight yesterday- did not weigh Last visit weight- 237 lb   Steven Chase was seen at home today and reported feeling well. He denied chest pain, SOB, headache, dizziness, orthopnea, fever or cough since our last visit. He stated he has been compliant with his medications over the past week and his weight has been stable. He was found to be hypertensive but stated he had not taken his afternoon medications yet and could not recall what time he took his morning medications. His medications were verified and his pillbox was refilled. I will follow up on Tuesday morning to see  if his blood pressure is controlled.    Steven Chase, EMT 01/19/20  ACTION: Home visit completed Next visit planned for 1 week

## 2020-01-24 ENCOUNTER — Ambulatory Visit (INDEPENDENT_AMBULATORY_CARE_PROVIDER_SITE_OTHER): Payer: 59 | Admitting: Internal Medicine

## 2020-01-24 ENCOUNTER — Other Ambulatory Visit: Payer: Self-pay

## 2020-01-24 ENCOUNTER — Encounter: Payer: Self-pay | Admitting: Internal Medicine

## 2020-01-24 VITALS — BP 130/80 | HR 86 | Temp 96.8°F | Ht 66.0 in | Wt 251.0 lb

## 2020-01-24 DIAGNOSIS — I5022 Chronic systolic (congestive) heart failure: Secondary | ICD-10-CM | POA: Diagnosis not present

## 2020-01-24 DIAGNOSIS — N1831 Chronic kidney disease, stage 3a: Secondary | ICD-10-CM

## 2020-01-24 DIAGNOSIS — E781 Pure hyperglyceridemia: Secondary | ICD-10-CM | POA: Diagnosis not present

## 2020-01-24 DIAGNOSIS — E785 Hyperlipidemia, unspecified: Secondary | ICD-10-CM

## 2020-01-24 NOTE — Progress Notes (Signed)
LIPID CLINIC CONSULT NOTE  Chief Complaint:  Manage dyslipidemia  Primary Care Physician: Biagio Borg, MD  Primary Cardiologist:  Mertie Moores, MD  HPI:  Steven Chase is a 34 y.o. male who is being seen today for the evaluation of dyslipidemia at the request of Bensimhon, Shaune Pascal, MD.  This is a pleasant 34 year old male patient of Dr. Haroldine Laws the advanced heart failure clinic with nonischemic cardiomyopathy, hypertension, obesity and obstructive sleep apnea.  He was recently hospitalized in January with acute kidney injury on CKD 3 a which improved with IV fluids holding diuretics, Arni and spironolactone.  His baseline creatinine is reportedly between 1.5 and 1.9.  He does have a history of likely hypertensive cardiomyopathy with EF 15 to 20% improved to 45% this past January.  At the time he was also noted to have an acute gout flare and had a high uric acid level.  In addition he had significant dyslipidemia with predominantly high triglycerides of 1378, total cholesterol 309.  A direct LDL was 77.  Dr. Haroldine Laws telephone me and I recommended certain therapies however at the time they were contraindicated due to his acute kidney injury.  Fortunately his creatinine had improved and at discharge had trended down to 1.86.  He was then started on fenofibrate in addition to Lovaza 2 g twice daily.  He is not on a statin.  Mr. Ju reports compliance with the medications.  He does have a history of obstructive sleep apnea and today arrived very early for his appointment.  He was noted to be asleep in the lobby and having significantly loud snoring.  PMHx:  Past Medical History:  Diagnosis Date  . CHF (congestive heart failure) ([place])   . Hypertension   . Obesity   . OSA (obstructive sleep apnea) 11/05/2014   02/2015 -severe -AHI 106/h     Past Surgical History:  Procedure Laterality Date  . HERNIA REPAIR    . RIGHT/LEFT HEART CATH AND CORONARY ANGIOGRAPHY N/A 02/07/2018   Procedure: RIGHT/LEFT HEART CATH AND CORONARY ANGIOGRAPHY;  Surgeon: Leonie Man, MD;  Location: [facility];  Service: Cardiovascular;  Laterality: N/A;    FAMHx:  Family History  Problem Relation Age of Onset  . Hypertension Mother   . Heart disease Maternal Grandfather   . Hypertension Maternal Grandfather   . Hypertension Sister     SOCHx:   reports that he has never smoked. He has never used smokeless tobacco. He reports that he does not drink alcohol or use drugs.  ALLERGIES:  No Known Allergies  ROS: Pertinent items noted in HPI and remainder of comprehensive ROS otherwise negative.  HOME MEDS: Current Outpatient Medications on File Prior to Visit  Medication Sig Dispense Refill  . allopurinol (ZYLOPRIM) 100 MG tablet Take 1 tablet (100 mg total) by mouth 2 (two) times daily. 60 tablet 3  . carvedilol (COREG) 12.5 MG tablet TAKE 1 TABLET BY MOUTH TWICE DAILY WITH A MEAL 180 tablet 2  . colchicine 0.6 MG tablet Take 1 tablet (0.6 mg total) by mouth daily. 30 tablet 5  . fenofibrate 160 MG tablet Take 1 tablet (160 mg total) by mouth daily. 90 tablet 2  . hydrALAZINE (APRESOLINE) 100 MG tablet Take 1 tablet (100 mg total) by mouth every 8 (eight) hours. 90 tablet 11  . omega-3 acid ethyl esters (LOVAZA) 1 g capsule Take 2 capsules (2 g total) by mouth 2 (two) times daily. 120 capsule 6  .  predniSONE (DELTASONE) 10 MG tablet 3 tabs po qd x 3 days, then 2 tabs po qd x 3 days, then 1 tab po qd x 3 days 18 tablet 0  . sacubitril-valsartan (ENTRESTO) 24-26 MG Take 1 tablet by mouth 2 (two) times daily. 60 tablet 3  . spironolactone (ALDACTONE) 25 MG tablet Take 0.5 tablets (12.5 mg total) by mouth daily. 45 tablet 3  . torsemide (DEMADEX) 20 MG tablet Take 1 tablet (20 mg total) by mouth daily. 90 tablet 3  . [DISCONTINUED] isosorbide mononitrate (IMDUR) 60 MG 24 hr tablet Take 1 tablet (60 mg total) by mouth daily. (Patient not taking: Reported on 11/21/2019) 30 tablet 3   . [DISCONTINUED] rosuvastatin (CRESTOR) 40 MG tablet Take 1 tablet (40 mg total) by mouth daily at 6 PM. (Patient not taking: Reported on 11/15/2019) 90 tablet 0   No current facility-administered medications on file prior to visit.    LABS/IMAGING: No results found for this or any previous visit (from the past 48 hour(s)). No results found.  LIPID PANEL:    Component Value Date/Time   CHOL 309 (H) 11/12/2019 0100   TRIG 1,378 (H) 11/12/2019 0100   HDL NOT REPORTED DUE TO HIGH TRIGLYCERIDES 11/12/2019 0100   CHOLHDL NOT REPORTED DUE TO HIGH TRIGLYCERIDES 11/12/2019 0100   VLDL UNABLE TO CALCULATE IF TRIGLYCERIDE OVER 400 mg/dL 53/66/4403 4742   LDLCALC UNABLE TO CALCULATE IF TRIGLYCERIDE OVER 400 mg/dL 59/56/3875 6433   LDLDIRECT 77.6 11/12/2019 0100    WEIGHTS: Wt Readings from Last 3 Encounters:  01/24/20 251 lb (113.9 kg)  01/19/20 236 lb (107 kg)  01/12/20 237 lb (107.5 kg)    VITALS: BP 130/80   Pulse 86   Temp (!) 96.8 F (36 C)   Ht 5\' 6"  (1.676 m)   Wt 251 lb (113.9 kg)   SpO2 96%   BMI 40.51 kg/m   EXAM: Deferred  EKG: Deferred  ASSESSMENT: 1. Mixed dyslipidemia with high triglycerides 2. Nonischemic cardiomyopathy-LVEF 15 to 20%, improved to 45% 3. Recent AKI on CKD 3A  PLAN: 1.   Mr. Patrone has had elevated triglycerides recently in the setting of acute renal failure with hyperuricemia.  This has improved and subsequently he is now on Lovaza 2 g twice daily and fenofibrate.  Direct LDL was 77.  I would like to reassess his lipids now as its been 2 months on therapy and also repeat a metabolic profile.  Based on these numbers we could further titrate his medications.  He does not seem to report adhering to a very low saturated fat diet and asked today if it was okay to eat "chicken pot pies" which she is buying at [facility].  I advised against this as they may be very high in sodium.  Thanks again for the kind referral.  Plan follow-up with me in about 3  months.  [facility], MD, [facility], FACP  [place]  [facility]  Medical Director of the Advanced Lipid Disorders &  Cardiovascular Risk Reduction Clinic Diplomate of the American Board of Clinical Lipidology Attending Cardiologist  Direct Dial: [phone]  Fax: [phone]  Website:  [email or web address] Ladell Lea 01/24/2020, 4:02 PM

## 2020-01-24 NOTE — Patient Instructions (Addendum)
Medication Instructions:  Your physician recommends that you continue on your current medications as directed. Please refer to the Current Medication list given to you today. -- any changes will be based on your lab results   *If you need a refill on your cardiac medications before your next appointment, please call your pharmacy*   Lab Work: Lipid Panel, Direct LDL, CMET - fasting lab work   If you have labs (blood work) drawn today and your tests are completely normal, you will receive your results only by: Marland Kitchen MyChart Message (if you have MyChart) OR . A paper copy in the mail If you have any lab test that is abnormal or we need to change your treatment, we will call you to review the results.   Testing/Procedures: NONE   Follow-Up: At [facility], you and your health needs are our priority.  As part of our continuing mission to provide you with exceptional heart care, we have created designated Provider Care Teams.  These Care Teams include your primary Cardiologist (physician) and Advanced Practice Providers (APPs -  Physician Assistants and Nurse Practitioners) who all work together to provide you with the care you need, when you need it.  We recommend signing up for the patient portal called "MyChart".  Sign up information is provided on this After Visit Summary.  MyChart is used to connect with patients for Virtual Visits (Telemedicine).  Patients are able to view lab/test results, encounter notes, upcoming appointments, etc.  Non-urgent messages can be sent to your provider as well.   To learn more about what you can do with MyChart, go to [email or web address].    Your next appointment:   3 month(s) - lipid clinic  The format for your next appointment:   Either In Person or Virtual  Provider:   Dr. Rennis Golden    Other Instructions

## 2020-01-25 ENCOUNTER — Other Ambulatory Visit (HOSPITAL_COMMUNITY): Payer: Self-pay

## 2020-01-25 NOTE — Progress Notes (Signed)
Paramedicine Encounter    Patient ID: Steven Chase, male    DOB: September 18, 1986, 34 y.o.   MRN: [id number]   Patient Care Team: Biagio Borg, MD as PCP - General (Internal Medicine) Nahser, Wonda Cheng, MD as PCP - Cardiology (Cardiology) Jorge Ny, LCSW as Social Worker (Licensed Clinical Social Worker)  Patient Active Problem List   Diagnosis Date Noted  . Acute gout of left ankle 01/04/2020  . AKI (acute kidney injury) ([place]) 11/10/2019  . Hyperkalemia 11/10/2019  . Acute exacerbation of CHF (congestive heart failure) ([place]) 07/15/2019  . Lipodystrophy 10/21/2018  . Obesity (BMI 30-39.9) 10/06/2018  . Chronic systolic heart failure ([place]) 02/17/2018  . Fatigue 02/17/2018  . Cardiomyopathy ([place]) 02/11/2018  . Hypertensive crisis 02/04/2018  . Chest pain 02/04/2018  . CKD (chronic kidney disease), stage II 02/04/2018  . Allergic rhinitis 01/21/2018  . Hyperglycemia 12/02/2017  . Hypokalemia 12/02/2017  . Cough 12/02/2017  . Patellar tendonitis of left knee 12/02/2017  . Preventative health care 12/10/2016  . Obesity 05/25/2016  . Lower leg edema 12/12/2014  . Essential hypertension 11/05/2014  . OSA (obstructive sleep apnea) 11/05/2014    Current Outpatient Medications:  .  allopurinol (ZYLOPRIM) 100 MG tablet, Take 1 tablet (100 mg total) by mouth 2 (two) times daily., Disp: 60 tablet, Rfl: 3 .  carvedilol (COREG) 12.5 MG tablet, TAKE 1 TABLET BY MOUTH TWICE DAILY WITH A MEAL, Disp: 180 tablet, Rfl: 2 .  fenofibrate 160 MG tablet, Take 1 tablet (160 mg total) by mouth daily., Disp: 90 tablet, Rfl: 2 .  hydrALAZINE (APRESOLINE) 100 MG tablet, Take 1 tablet (100 mg total) by mouth every 8 (eight) hours., Disp: 90 tablet, Rfl: 11 .  omega-3 acid ethyl esters (LOVAZA) 1 g capsule, Take 2 capsules (2 g total) by mouth 2 (two) times daily., Disp: 120 capsule, Rfl: 6 .  sacubitril-valsartan (ENTRESTO) 24-26 MG, Take 1 tablet by mouth 2 (two) times daily., Disp: 60 tablet, Rfl: 3 .   spironolactone (ALDACTONE) 25 MG tablet, Take 0.5 tablets (12.5 mg total) by mouth daily., Disp: 45 tablet, Rfl: 3 .  torsemide (DEMADEX) 20 MG tablet, Take 1 tablet (20 mg total) by mouth daily., Disp: 90 tablet, Rfl: 3 .  colchicine 0.6 MG tablet, Take 1 tablet (0.6 mg total) by mouth daily. (Patient not taking: Reported on 01/25/2020), Disp: 30 tablet, Rfl: 5 .  predniSONE (DELTASONE) 10 MG tablet, 3 tabs po qd x 3 days, then 2 tabs po qd x 3 days, then 1 tab po qd x 3 days (Patient not taking: Reported on 01/25/2020), Disp: 18 tablet, Rfl: 0 No Known Allergies    Social History   Socioeconomic History  . Marital status: Single    Spouse name: Not on file  . Number of children: 1  . Years of education: 38  . Highest education level: Not on file  Occupational History  . Occupation: Research scientist (physical sciences): [employer]  Tobacco Use  . Smoking status: Never Smoker  . Smokeless tobacco: Never Used  Substance and Sexual Activity  . Alcohol use: No  . Drug use: No  . Sexual activity: Yes    Birth control/protection: Condom  Other Topics Concern  . Not on file  Social History Narrative   Born and raised in [place], [place]. Currently live in a private residence with his mother. Fun: Play with his daughter.   Denies religious beliefs that would effect health care.    Social  Determinants of Health   Financial Resource Strain: Low Risk   . Difficulty of Paying Living Expenses: Not very hard  Food Insecurity: Food Insecurity Present  . Worried About Programme researcher, broadcasting/film/video in the Last Year: Sometimes true  . Ran Out of Food in the Last Year: Sometimes true  Transportation Needs: No Transportation Needs  . Lack of Transportation (Medical): No  . Lack of Transportation (Non-Medical): No  Physical Activity:   . Days of Exercise per Week:   . Minutes of Exercise per Session:   Stress:   . Feeling of Stress :   Social Connections:   . Frequency of Communication with Friends and  Family:   . Frequency of Social Gatherings with Friends and Family:   . Attends Religious Services:   . Active Member of Clubs or Organizations:   . Attends Banker Meetings:   Marland Kitchen Marital Status:   Intimate Partner Violence:   . Fear of Current or Ex-Partner:   . Emotionally Abused:   Marland Kitchen Physically Abused:   . Sexually Abused:     Physical Exam Cardiovascular:     Rate and Rhythm: Normal rate and regular rhythm.     Pulses: Normal pulses.  Pulmonary:     Effort: Pulmonary effort is normal.     Breath sounds: Normal breath sounds.  Musculoskeletal:        General: Normal range of motion.     Right lower leg: No edema.     Left lower leg: No edema.  Skin:    General: Skin is warm and dry.     Capillary Refill: Capillary refill takes less than 2 seconds.  Neurological:     Mental Status: He is alert and oriented to person, place, and time.  Psychiatric:        Mood and Affect: Mood normal.         Future Appointments  Date Time Provider Department Center  02/05/2020 10:40 AM Roswell Nickel, DO [facility] None  02/19/2020  2:40 PM Roswell Nickel, DO [facility] None  03/11/2020  2:00 PM Bensimhon, Bevelyn Buckles, MD [facility] None  04/10/2020  3:00 PM Corwin Levins, MD [facility] None    BP 126/86 (BP Location: Right Arm, Patient Position: Sitting, Cuff Size: Normal)   Pulse 90   Resp 16   Wt 254 lb (115.2 kg)   SpO2 98%   BMI 41.00 kg/m   Weight yesterday- 251 lb Last visit weight-n/a  Steven Chase was seen at home today and reported feeling well. He denied chest pain, SOB, headache, dizziness, orthopnea, fever or cough since our last visit. He stated he has been compliant with his medications over the past week however his weight has significantly elevated from the last visit. I asked about this and he said he thought his scale was messed up and his weight from the doctors office was his correct weight. I had him check his weight while I was present and his scale weighed close  to the same as the doctors offic scale. I stressed the importance of daily weights and he was agreeable. His medications were verified and his pillbox was refilled. I will follow up next week.   [name], EMT 01/25/20  ACTION: Home visit completed Next visit planned for 1 week

## 2020-02-01 ENCOUNTER — Telehealth (HOSPITAL_COMMUNITY): Payer: Self-pay

## 2020-02-01 NOTE — Telephone Encounter (Signed)
I called Mr Steven Chase to see if he was available for a visit today. He stated he was about to go to work but would be off all day tomorrow so he would be able to meet then. We agreed to meet at 09:00.   [name], EMT 02/01/20

## 2020-02-02 ENCOUNTER — Other Ambulatory Visit (HOSPITAL_COMMUNITY): Payer: Self-pay

## 2020-02-02 NOTE — Progress Notes (Signed)
Paramedicine Encounter    Patient ID: Steven Chase, male    DOB: Mar 12, 1986, 34 y.o.   MRN: [id number]   Patient Care Team: Biagio Borg, MD as PCP - General (Internal Medicine) Nahser, Wonda Cheng, MD as PCP - Cardiology (Cardiology) Jorge Ny, LCSW as Social Worker (Licensed Clinical Social Worker)  Patient Active Problem List   Diagnosis Date Noted  . Acute gout of left ankle 01/04/2020  . AKI (acute kidney injury) ([place]) 11/10/2019  . Hyperkalemia 11/10/2019  . Acute exacerbation of CHF (congestive heart failure) ([place]) 07/15/2019  . Lipodystrophy 10/21/2018  . Obesity (BMI 30-39.9) 10/06/2018  . Chronic systolic heart failure ([place]) 02/17/2018  . Fatigue 02/17/2018  . Cardiomyopathy ([place]) 02/11/2018  . Hypertensive crisis 02/04/2018  . Chest pain 02/04/2018  . CKD (chronic kidney disease), stage II 02/04/2018  . Allergic rhinitis 01/21/2018  . Hyperglycemia 12/02/2017  . Hypokalemia 12/02/2017  . Cough 12/02/2017  . Patellar tendonitis of left knee 12/02/2017  . Preventative health care 12/10/2016  . Obesity 05/25/2016  . Lower leg edema 12/12/2014  . Essential hypertension 11/05/2014  . OSA (obstructive sleep apnea) 11/05/2014    Current Outpatient Medications:  .  allopurinol (ZYLOPRIM) 100 MG tablet, Take 1 tablet (100 mg total) by mouth 2 (two) times daily., Disp: 60 tablet, Rfl: 3 .  carvedilol (COREG) 12.5 MG tablet, TAKE 1 TABLET BY MOUTH TWICE DAILY WITH A MEAL, Disp: 180 tablet, Rfl: 2 .  fenofibrate 160 MG tablet, Take 1 tablet (160 mg total) by mouth daily., Disp: 90 tablet, Rfl: 2 .  hydrALAZINE (APRESOLINE) 100 MG tablet, Take 1 tablet (100 mg total) by mouth every 8 (eight) hours., Disp: 90 tablet, Rfl: 11 .  omega-3 acid ethyl esters (LOVAZA) 1 g capsule, Take 2 capsules (2 g total) by mouth 2 (two) times daily., Disp: 120 capsule, Rfl: 6 .  sacubitril-valsartan (ENTRESTO) 24-26 MG, Take 1 tablet by mouth 2 (two) times daily., Disp: 60 tablet, Rfl: 3 .   spironolactone (ALDACTONE) 25 MG tablet, Take 0.5 tablets (12.5 mg total) by mouth daily., Disp: 45 tablet, Rfl: 3 .  torsemide (DEMADEX) 20 MG tablet, Take 1 tablet (20 mg total) by mouth daily., Disp: 90 tablet, Rfl: 3 .  colchicine 0.6 MG tablet, Take 1 tablet (0.6 mg total) by mouth daily. (Patient not taking: Reported on 01/25/2020), Disp: 30 tablet, Rfl: 5 .  predniSONE (DELTASONE) 10 MG tablet, 3 tabs po qd x 3 days, then 2 tabs po qd x 3 days, then 1 tab po qd x 3 days (Patient not taking: Reported on 01/25/2020), Disp: 18 tablet, Rfl: 0 No Known Allergies    Social History   Socioeconomic History  . Marital status: Single    Spouse name: Not on file  . Number of children: 1  . Years of education: 25  . Highest education level: Not on file  Occupational History  . Occupation: Research scientist (physical sciences): [employer]  Tobacco Use  . Smoking status: Never Smoker  . Smokeless tobacco: Never Used  Substance and Sexual Activity  . Alcohol use: No  . Drug use: No  . Sexual activity: Yes    Birth control/protection: Condom  Other Topics Concern  . Not on file  Social History Narrative   Born and raised in [place], [place]. Currently live in a private residence with his mother. Fun: Play with his daughter.   Denies religious beliefs that would effect health care.    Social  Determinants of Health   Financial Resource Strain: Low Risk   . Difficulty of Paying Living Expenses: Not very hard  Food Insecurity: Food Insecurity Present  . Worried About Programme researcher, broadcasting/film/video in the Last Year: Sometimes true  . Ran Out of Food in the Last Year: Sometimes true  Transportation Needs: No Transportation Needs  . Lack of Transportation (Medical): No  . Lack of Transportation (Non-Medical): No  Physical Activity:   . Days of Exercise per Week:   . Minutes of Exercise per Session:   Stress:   . Feeling of Stress :   Social Connections:   . Frequency of Communication with Friends and  Family:   . Frequency of Social Gatherings with Friends and Family:   . Attends Religious Services:   . Active Member of Clubs or Organizations:   . Attends Banker Meetings:   Marland Kitchen Marital Status:   Intimate Partner Violence:   . Fear of Current or Ex-Partner:   . Emotionally Abused:   Marland Kitchen Physically Abused:   . Sexually Abused:     Physical Exam Cardiovascular:     Rate and Rhythm: Normal rate and regular rhythm.     Pulses: Normal pulses.  Pulmonary:     Effort: Pulmonary effort is normal.     Breath sounds: Normal breath sounds.  Musculoskeletal:        General: Normal range of motion.     Right lower leg: No edema.     Left lower leg: No edema.  Skin:    General: Skin is warm and dry.     Capillary Refill: Capillary refill takes less than 2 seconds.  Neurological:     Mental Status: He is alert and oriented to person, place, and time.  Psychiatric:        Mood and Affect: Mood normal.         Future Appointments  Date Time Provider Department Center  02/05/2020 10:40 AM Roswell Nickel, DO [facility] None  02/19/2020  2:40 PM [place] Nickel, DO [facility] None  03/11/2020  2:00 PM Bensimhon, Bevelyn Buckles, MD [facility] None  04/10/2020  3:00 PM Corwin Levins, MD [facility] None    BP 116/88 (BP Location: Right Arm, Patient Position: Sitting, Cuff Size: Normal)   Pulse (!) 102   Resp 16   Wt 247 lb (112 kg)   SpO2 98%   BMI 39.87 kg/m   Weight yesterday- 250 lb Last visit weight- 254 lb  Mr Goldner was seen at home today and reported feeling well. He denied chest pain, SOB, headache, dizziness, orthopnea, fever or cough over the past week. He stated he has been complainant with his medications over the past week and his weight is trending down. His medications were verified and his pillbox was refilled. I will follow up next week.   [name], EMT 02/02/20  ACTION: Home visit completed Next visit planned for 1 week

## 2020-02-05 ENCOUNTER — Other Ambulatory Visit: Payer: Self-pay

## 2020-02-05 ENCOUNTER — Ambulatory Visit (INDEPENDENT_AMBULATORY_CARE_PROVIDER_SITE_OTHER): Payer: 59 | Admitting: Bariatrics

## 2020-02-05 ENCOUNTER — Encounter (INDEPENDENT_AMBULATORY_CARE_PROVIDER_SITE_OTHER): Payer: Self-pay | Admitting: Bariatrics

## 2020-02-05 VITALS — BP 92/61 | HR 94 | Temp 97.9°F | Ht 66.0 in | Wt 248.0 lb

## 2020-02-05 DIAGNOSIS — R0602 Shortness of breath: Secondary | ICD-10-CM

## 2020-02-05 DIAGNOSIS — Z1331 Encounter for screening for depression: Secondary | ICD-10-CM | POA: Diagnosis not present

## 2020-02-05 DIAGNOSIS — I5022 Chronic systolic (congestive) heart failure: Secondary | ICD-10-CM

## 2020-02-05 DIAGNOSIS — Z6841 Body Mass Index (BMI) 40.0 and over, adult: Secondary | ICD-10-CM

## 2020-02-05 DIAGNOSIS — R5383 Other fatigue: Secondary | ICD-10-CM

## 2020-02-05 DIAGNOSIS — R739 Hyperglycemia, unspecified: Secondary | ICD-10-CM

## 2020-02-05 DIAGNOSIS — I10 Essential (primary) hypertension: Secondary | ICD-10-CM

## 2020-02-05 DIAGNOSIS — Z9189 Other specified personal risk factors, not elsewhere classified: Secondary | ICD-10-CM | POA: Diagnosis not present

## 2020-02-05 DIAGNOSIS — M109 Gout, unspecified: Secondary | ICD-10-CM

## 2020-02-05 DIAGNOSIS — G4733 Obstructive sleep apnea (adult) (pediatric): Secondary | ICD-10-CM

## 2020-02-05 DIAGNOSIS — Z0289 Encounter for other administrative examinations: Secondary | ICD-10-CM

## 2020-02-05 NOTE — Progress Notes (Signed)
Chief Complaint:   OBESITY Steven Chase (MR# [id number]) is a 34 y.o. male who presents for evaluation and treatment of obesity and related comorbidities. Current BMI is Body mass index is 40.03 kg/m.Marland Kitchen [name] has been struggling with his weight for many years and has been unsuccessful in either losing weight, maintaining weight loss, or reaching his healthy weight goal.  [name] is currently in the action stage of change and ready to dedicate time achieving and maintaining a healthier weight. [name] is interested in becoming our patient and working on intensive lifestyle modifications including (but not limited to) diet and exercise for weight loss.  [name] sometimes likes to [employer]. He likes to snack after work.  [name]'s habits were reviewed today and are as follows: he thinks his family will eat healthier with him, he started gaining weight at age 7, his heaviest weight ever was 248 pounds, he frequently makes poor food choices, he sometimes eats larger portions than normal and he struggles with emotional eating.  Depression Screen [name]'s Food and Mood (modified PHQ-9) score was 7.  Depression screen [name] 2/9 02/05/2020  Decreased Interest 2  Down, Depressed, Hopeless 0  PHQ - 2 Score 2  Altered sleeping 3  Tired, decreased energy 0  Change in appetite 0  Feeling bad or failure about yourself  1  Trouble concentrating 0  Moving slowly or fidgety/restless 0  Suicidal thoughts 1  PHQ-9 Score 7  Difficult doing work/chores Not difficult at all   Subjective:   Other fatigue. [name] admits to daytime somnolence. Patent has a history of symptoms of daytime fatigue. [name] generally gets 6-7 hours of sleep per night, and states that he has generally restful sleep. Snoring is present. Apneic episodes are present. Epworth Sleepiness Score is 4.  Shortness of breath on exertion. [name] notes increasing shortness of breath with certain activities and seems to be worsening over time  with weight gain. He notes getting out of breath sooner with activity than he used to. [name] denies shortness of breath at rest or orthopnea.  Essential hypertension. [name] is taking Coreg. He has a history of hypertensive crisis, controlled with medication.  BP Readings from Last 3 Encounters:  02/05/20 92/61  02/02/20 116/88  01/25/20 126/86   Lab Results  Component Value Date   CREATININE 1.86 (H) 11/21/2019   CREATININE 2.40 (H) 11/13/2019   CREATININE 2.71 (H) 11/12/2019   Gout, unspecified cause, unspecified chronicity, unspecified site. [name] takes allopurinol and colchicine. Symptoms are controlled.  Chronic systolic heart failure (HCC). [name] is taking Entresto, spironolactone, and Demadex. He [facility].  OSA (obstructive sleep apnea). [name] wears CPAP and reports feelings rested.  Hyperglycemia. [name] has a history of some elevated blood glucose readings without a diagnosis of diabetes. He denies polyphagia.  Depression screening. [name] had a mildly positive depression screening with a PHQ-9 score of 7.  At risk for heart disease. [name] is at a higher than average risk for cardiovascular disease due to hypertension and obesity.   Assessment/Plan:   Other fatigue. Fatigue may be related to obesity, depression or many other causes. Labs will be ordered, and in the meanwhile, [name] will focus on self care including making healthy food choices, increasing physical activity and focusing on stress reduction. EKG 12-Lead, VITAMIN D 25 Hydroxy (Vit-D Deficiency, Fractures), Vitamin B12, T3, T4, free, TSH tests ordered today.  Shortness of breath on exertion. [name]'s shortness of breath appears to be obesity related and exercise  induced. He has agreed to work on weight loss and gradually increase exercise to treat his exercise induced shortness of breath. Will continue to monitor closely. Lipid Panel With LDL/HDL Ratio labs ordered today.  Essential  hypertension. [name] is working on healthy weight loss and exercise to improve blood pressure control. We will watch for signs of hypotension as he continues his lifestyle modifications. He will continue his medication as directed.  Gout, unspecified cause, unspecified chronicity, unspecified site. [name] will continue his medications as directed.  Chronic systolic heart failure (HCC). [name] will follow-up with the [facility] Failure Clinic. Lipid Panel With LDL/HDL Ratio ordered today.  OSA (obstructive sleep apnea). Intensive lifestyle modifications are the first line treatment for this issue. We discussed several lifestyle modifications today and he will continue to work on diet, exercise and weight loss efforts. We will continue to monitor. Orders and follow up as documented in patient record. [name] will continue to use CPAP.  Counseling  Sleep apnea is a condition in which breathing pauses or becomes shallow during sleep. This happens over and over during the night. This disrupts your sleep and keeps your body from getting the rest that it needs, which can cause tiredness and lack of energy (fatigue) during the day.  Sleep apnea treatment: If you were given a device to open your airway while you sleep, USE IT!  Sleep hygiene:   Limit or avoid alcohol, caffeinated beverages, and cigarettes, especially close to bedtime.   Do not eat a large meal or eat spicy foods right before bedtime. This can lead to digestive discomfort that can make it hard for you to sleep.  Keep a sleep diary to help you and your health care provider figure out what could be causing your insomnia.  . Make your bedroom a dark, comfortable place where it is easy to fall asleep. ? Put up shades or blackout curtains to block light from outside. ? Use a white noise machine to block noise. ? Keep the temperature cool. . Limit screen use before bedtime. This includes: ? Watching TV. ? Using your smartphone, tablet, or  computer. . Stick to a routine that includes going to bed and waking up at the same times every day and night. This can help you fall asleep faster. Consider making a quiet activity, such as reading, part of your nighttime routine. . Try to avoid taking naps during the day so that you sleep better at night. . Get out of bed if you are still awake after 15 minutes of trying to sleep. Keep the lights down, but try reading or doing a quiet activity. When you feel sleepy, go back to bed.  Hyperglycemia. Fasting labs will be obtained and results with be discussed with Koah in 2 weeks at his follow up visit. In the meanwhile Major was started on a lower simple carbohydrate diet and will work on weight loss efforts. Comprehensive metabolic panel, Hemoglobin A1c, Insulin, random labs ordered today.  Depression screening. Jasraj had a positive depression screening. Depression is commonly associated with obesity and often results in emotional eating behaviors. We will monitor this closely and work on CBT to help improve the non-hunger eating patterns. Referral to Psychology may be required if no improvement is seen as he continues in our clinic.  At risk for heart disease. Jamaris was given approximately 15 minutes of coronary artery disease prevention counseling today. He is 34 y.o. male and has risk factors for heart disease including obesity. We discussed intensive lifestyle  modifications today with an emphasis on specific weight loss instructions and strategies.   Repetitive spaced learning was employed today to elicit superior memory formation and behavioral change.  Class 3 severe obesity with serious comorbidity and body mass index (BMI) of 40.0 to 44.9 in adult, unspecified obesity type ([place]).  Symon is currently in the action stage of change and his goal is to continue with weight loss efforts. I recommend Jachai begin the structured treatment plan as follows:  He has agreed to the Category 3  Plan.  He will work on meal planning, intentional eating, avoiding sugary drinks, and stopping juice.  We independently reviewed with the patient labs from 11/20/2018 including CMP and glucose; labs from 09/12/2020 including CMP, CBC, and glucose.  Exercise goals: All adults should avoid inactivity. Some physical activity is better than none, and adults who participate in any amount of physical activity gain some health benefits.   Behavioral modification strategies: increasing lean protein intake, decreasing simple carbohydrates, increasing vegetables, increasing water intake, decreasing eating out, no skipping meals, meal planning and cooking strategies, keeping healthy foods in the home, ways to avoid boredom eating, ways to avoid night time snacking, better snacking choices, emotional eating strategies and planning for success.  He was informed of the importance of frequent follow-up visits to maximize his success with intensive lifestyle modifications for his multiple health conditions. He was informed we would discuss his lab results at his next visit unless there is a critical issue that needs to be addressed sooner. Fitzgerald agreed to keep his next visit at the agreed upon time to discuss these results.  Objective:   Blood pressure 92/61, pulse 94, temperature 97.9 F (36.6 C), height 5\' 6"  (1.676 m), weight 248 lb (112.5 kg), SpO2 95 %. Body mass index is 40.03 kg/m.  EKG: Sinus  Rhythm with a rate of 95 BPM. Horizontal axis for age. Nonspecific ST depression + T-abnormality. Abnormal but nondiagnostic for this age. Otherwise normal.  Indirect Calorimeter completed today shows a VO2 of 286 and a REE of 1990.  His calculated basal metabolic rate is 5027 thus his basal metabolic rate is worse than expected.  General: Cooperative, alert, well developed, in no acute distress. HEENT: Conjunctivae and lids unremarkable. Cardiovascular: Regular rhythm.  Lungs: Normal work of  breathing. Neurologic: No focal deficits.   Lab Results  Component Value Date   CREATININE 1.86 (H) 11/21/2019   BUN 32 (H) 11/21/2019   NA 140 11/21/2019   K 4.0 11/21/2019   CL 100 11/21/2019   CO2 28 11/21/2019   Lab Results  Component Value Date   ALT 23 11/11/2019   AST 23 11/11/2019   ALKPHOS 41 11/11/2019   BILITOT 0.5 11/11/2019   Lab Results  Component Value Date   HGBA1C 6.6 (A) 10/11/2019   HGBA1C 6.0 (H) 07/19/2019   HGBA1C 5.6 10/06/2018   HGBA1C 0 10/06/2018   HGBA1C 0 (A) 10/06/2018   HGBA1C 0.0 10/06/2018   No results found for: INSULIN Lab Results  Component Value Date   TSH 1.953 02/06/2018   Lab Results  Component Value Date   CHOL 309 (H) 11/12/2019   HDL NOT REPORTED DUE TO HIGH TRIGLYCERIDES 11/12/2019   LDLCALC UNABLE TO CALCULATE IF TRIGLYCERIDE OVER 400 mg/dL 11/12/2019   LDLDIRECT 77.6 11/12/2019   TRIG 1,378 (H) 11/12/2019   CHOLHDL NOT REPORTED DUE TO HIGH TRIGLYCERIDES 11/12/2019   Lab Results  Component Value Date   WBC 11.7 (H) 11/13/2019   HGB  10.4 (L) 11/13/2019   HCT 30.2 (L) 11/13/2019   MCV 78.2 (L) 11/13/2019   PLT 271 11/13/2019   Lab Results  Component Value Date   IRON 71 11/12/2019   TIBC 412 11/12/2019   FERRITIN 377 (H) 11/12/2019   Attestation Statements:   Reviewed by clinician on day of visit: allergies, medications, problem list, medical history, surgical history, family history, social history, and previous encounter notes.  Fernanda Drum, am acting as Energy manager for [employer], DO   I have reviewed the above documentation for accuracy and completeness, and I agree with the above. Corinna Capra, DO

## 2020-02-06 ENCOUNTER — Encounter (INDEPENDENT_AMBULATORY_CARE_PROVIDER_SITE_OTHER): Payer: Self-pay | Admitting: Bariatrics

## 2020-02-06 DIAGNOSIS — R748 Abnormal levels of other serum enzymes: Secondary | ICD-10-CM | POA: Insufficient documentation

## 2020-02-06 DIAGNOSIS — E1169 Type 2 diabetes mellitus with other specified complication: Secondary | ICD-10-CM | POA: Insufficient documentation

## 2020-02-06 DIAGNOSIS — E781 Pure hyperglyceridemia: Secondary | ICD-10-CM | POA: Insufficient documentation

## 2020-02-06 DIAGNOSIS — E785 Hyperlipidemia, unspecified: Secondary | ICD-10-CM | POA: Insufficient documentation

## 2020-02-06 DIAGNOSIS — E669 Obesity, unspecified: Secondary | ICD-10-CM | POA: Insufficient documentation

## 2020-02-06 DIAGNOSIS — E559 Vitamin D deficiency, unspecified: Secondary | ICD-10-CM | POA: Insufficient documentation

## 2020-02-06 DIAGNOSIS — N183 Chronic kidney disease, stage 3 unspecified: Secondary | ICD-10-CM | POA: Insufficient documentation

## 2020-02-06 LAB — T4, FREE: Free T4: 1.07 ng/dL (ref 0.82–1.77)

## 2020-02-06 LAB — VITAMIN D 25 HYDROXY (VIT D DEFICIENCY, FRACTURES): Vit D, 25-Hydroxy: 17.8 ng/mL — ABNORMAL LOW (ref 30.0–100.0)

## 2020-02-06 LAB — COMPREHENSIVE METABOLIC PANEL
ALT: 56 IU/L — ABNORMAL HIGH (ref 0–44)
AST: 47 IU/L — ABNORMAL HIGH (ref 0–40)
Albumin/Globulin Ratio: 1.6 (ref 1.2–2.2)
Albumin: 4.6 g/dL (ref 4.0–5.0)
Alkaline Phosphatase: 31 IU/L — ABNORMAL LOW (ref 39–117)
BUN/Creatinine Ratio: 11 (ref 9–20)
BUN: 27 mg/dL — ABNORMAL HIGH (ref 6–20)
Bilirubin Total: 0.2 mg/dL (ref 0.0–1.2)
CO2: 28 mmol/L (ref 20–29)
Calcium: 9.8 mg/dL (ref 8.7–10.2)
Chloride: 93 mmol/L — ABNORMAL LOW (ref 96–106)
Creatinine, Ser: 2.45 mg/dL — ABNORMAL HIGH (ref 0.76–1.27)
GFR calc Af Amer: 38 mL/min/{1.73_m2} — ABNORMAL LOW (ref >59)
GFR calc non Af Amer: 33 mL/min/{1.73_m2} — ABNORMAL LOW (ref >59)
Globulin, Total: 2.9 g/dL (ref 1.5–4.5)
Glucose: 113 mg/dL — ABNORMAL HIGH (ref 65–99)
Potassium: 3.2 mmol/L — ABNORMAL LOW (ref 3.5–5.2)
Sodium: 142 mmol/L (ref 134–144)
Total Protein: 7.5 g/dL (ref 6.0–8.5)

## 2020-02-06 LAB — HEMOGLOBIN A1C
Est. average glucose Bld gHb Est-mCnc: 117 mg/dL
Hgb A1c MFr Bld: 5.7 % — ABNORMAL HIGH (ref 4.8–5.6)

## 2020-02-06 LAB — LIPID PANEL WITH LDL/HDL RATIO
Cholesterol, Total: 270 mg/dL — ABNORMAL HIGH (ref 100–199)
HDL: 36 mg/dL — ABNORMAL LOW (ref >39)
LDL Chol Calc (NIH): 167 mg/dL — ABNORMAL HIGH (ref 0–99)
LDL/HDL Ratio: 4.6 ratio — ABNORMAL HIGH (ref 0.0–3.6)
Triglycerides: 347 mg/dL — ABNORMAL HIGH (ref 0–149)
VLDL Cholesterol Cal: 67 mg/dL — ABNORMAL HIGH (ref 5–40)

## 2020-02-06 LAB — VITAMIN B12: Vitamin B-12: 534 pg/mL (ref 232–1245)

## 2020-02-06 LAB — INSULIN, RANDOM: INSULIN: 55.7 u[IU]/mL — ABNORMAL HIGH (ref 2.6–24.9)

## 2020-02-06 LAB — T3: T3, Total: 117 ng/dL (ref 71–180)

## 2020-02-06 LAB — TSH: TSH: 2.88 u[IU]/mL (ref 0.450–4.500)

## 2020-02-08 ENCOUNTER — Telehealth (HOSPITAL_COMMUNITY): Payer: Self-pay

## 2020-02-08 NOTE — Telephone Encounter (Signed)
I called Steven Chase to see if he was available for a visit. He stated he had just been called into work so he was preparing to leave for the day. He said he would be available tomorrow morning so we agreed to meet at 10:30.  [name], EMT 02/08/20

## 2020-02-09 ENCOUNTER — Other Ambulatory Visit (HOSPITAL_COMMUNITY): Payer: Self-pay

## 2020-02-09 NOTE — Progress Notes (Signed)
Paramedicine Encounter    Patient ID: Steven Chase, male    DOB: 08/03/1986, 34 y.o.   MRN: [id number]   Patient Care Team: Biagio Borg, MD as PCP - General (Internal Medicine) Nahser, Wonda Cheng, MD as PCP - Cardiology (Cardiology) Jorge Ny, LCSW as Social Worker (Licensed Clinical Social Worker)  Patient Active Problem List   Diagnosis Date Noted  . Elevated liver enzymes 02/06/2020  . CKD (chronic kidney disease) stage 3, GFR 30-59 ml/min 02/06/2020  . Diabetes mellitus type 2 in obese ([place]) 02/06/2020  . Vitamin D deficiency 02/06/2020  . Dyslipidemia (high LDL; low HDL) 02/06/2020  . Hypertriglyceridemia 02/06/2020  . Morbid obesity ([place]) 02/05/2020  . Acute gout of left ankle 01/04/2020  . AKI (acute kidney injury) ([place]) 11/10/2019  . Hyperkalemia 11/10/2019  . Acute exacerbation of CHF (congestive heart failure) ([place]) 07/15/2019  . Lipodystrophy 10/21/2018  . Chronic systolic heart failure ([place]) 02/17/2018  . Fatigue 02/17/2018  . Cardiomyopathy ([place]) 02/11/2018  . Hypertensive crisis 02/04/2018  . Chest pain 02/04/2018  . Allergic rhinitis 01/21/2018  . Hyperglycemia 12/02/2017  . Hypokalemia 12/02/2017  . Cough 12/02/2017  . Patellar tendonitis of left knee 12/02/2017  . Preventative health care 12/10/2016  . Obesity 05/25/2016  . Lower leg edema 12/12/2014  . Essential hypertension 11/05/2014  . OSA (obstructive sleep apnea) 11/05/2014    Current Outpatient Medications:  .  allopurinol (ZYLOPRIM) 100 MG tablet, Take 1 tablet (100 mg total) by mouth 2 (two) times daily., Disp: 60 tablet, Rfl: 3 .  carvedilol (COREG) 12.5 MG tablet, TAKE 1 TABLET BY MOUTH TWICE DAILY WITH A MEAL, Disp: 180 tablet, Rfl: 2 .  colchicine 0.6 MG tablet, Take 1 tablet (0.6 mg total) by mouth daily., Disp: 30 tablet, Rfl: 5 .  fenofibrate 160 MG tablet, Take 1 tablet (160 mg total) by mouth daily., Disp: 90 tablet, Rfl: 2 .  hydrALAZINE (APRESOLINE) 100 MG tablet, Take 1 tablet  (100 mg total) by mouth every 8 (eight) hours., Disp: 90 tablet, Rfl: 11 .  omega-3 acid ethyl esters (LOVAZA) 1 g capsule, Take 2 capsules (2 g total) by mouth 2 (two) times daily., Disp: 120 capsule, Rfl: 6 .  sacubitril-valsartan (ENTRESTO) 24-26 MG, Take 1 tablet by mouth 2 (two) times daily., Disp: 60 tablet, Rfl: 3 .  spironolactone (ALDACTONE) 25 MG tablet, Take 0.5 tablets (12.5 mg total) by mouth daily., Disp: 45 tablet, Rfl: 3 .  torsemide (DEMADEX) 20 MG tablet, Take 1 tablet (20 mg total) by mouth daily., Disp: 90 tablet, Rfl: 3 No Known Allergies    Social History   Socioeconomic History  . Marital status: Single    Spouse name: Not on file  . Number of children: 1  . Years of education: 1  . Highest education level: Not on file  Occupational History  . Occupation: Research scientist (physical sciences): [employer]  Tobacco Use  . Smoking status: Never Smoker  . Smokeless tobacco: Never Used  Substance and Sexual Activity  . Alcohol use: No  . Drug use: No  . Sexual activity: Yes    Birth control/protection: Condom  Other Topics Concern  . Not on file  Social History Narrative   Born and raised in [place], [place]. Currently live in a private residence with his mother. Fun: Play with his daughter.   Denies religious beliefs that would effect health care.    Social Determinants of Health   Financial Resource Strain: Low Risk   .  Difficulty of Paying Living Expenses: Not very hard  Food Insecurity: Food Insecurity Present  . Worried About Programme researcher, broadcasting/film/video in the Last Year: Sometimes true  . Ran Out of Food in the Last Year: Sometimes true  Transportation Needs: No Transportation Needs  . Lack of Transportation (Medical): No  . Lack of Transportation (Non-Medical): No  Physical Activity:   . Days of Exercise per Week:   . Minutes of Exercise per Session:   Stress:   . Feeling of Stress :   Social Connections:   . Frequency of Communication with Friends and Family:    . Frequency of Social Gatherings with Friends and Family:   . Attends Religious Services:   . Active Member of Clubs or Organizations:   . Attends Banker Meetings:   Marland Kitchen Marital Status:   Intimate Partner Violence:   . Fear of Current or Ex-Partner:   . Emotionally Abused:   Marland Kitchen Physically Abused:   . Sexually Abused:     Physical Exam Cardiovascular:     Rate and Rhythm: Normal rate and regular rhythm.     Pulses: Normal pulses.  Pulmonary:     Effort: Pulmonary effort is normal.     Breath sounds: Normal breath sounds.  Musculoskeletal:     Right lower leg: No edema.     Left lower leg: No edema.  Skin:    General: Skin is warm and dry.     Capillary Refill: Capillary refill takes less than 2 seconds.  Neurological:     Mental Status: He is alert and oriented to person, place, and time.  Psychiatric:        Mood and Affect: Mood normal.         Future Appointments  Date Time Provider Department Center  02/19/2020  2:40 PM Roswell Nickel, DO [facility] None  03/11/2020  2:00 PM Bensimhon, Bevelyn Buckles, MD [facility] None  04/10/2020  3:00 PM Corwin Levins, MD [facility] None    BP 129/80 (BP Location: Left Arm, Patient Position: Sitting, Cuff Size: Normal)   Pulse 87   Resp 16   Wt 246 lb (111.6 kg)   SpO2 97%   BMI 39.71 kg/m   Weight yesterday- did not weigh Last visit weight- 248 lb  Mr Palmateer was seen at home today and reported feeling well. He denied chest pain, SOB, headache, dizziness, orthopnea, fever or cough since our last visit. He stated he has been compliant with his medications over the past week and his weight has been stable. His medications were verified and his pillbbox was refilled. I will follow up next week.   [name], EMT 02/09/20  ACTION: Home visit completed Next visit planned for 1 week

## 2020-02-16 ENCOUNTER — Other Ambulatory Visit (HOSPITAL_COMMUNITY): Payer: Self-pay

## 2020-02-16 NOTE — Progress Notes (Signed)
Paramedicine Encounter    Patient ID: Steven Chase, male    DOB: [birth year], 34 y.o.   MRN: [id number]   Patient Care Team: Corwin Levins, MD as PCP - General (Internal Medicine) Nahser, Deloris Ping, MD as PCP - Cardiology (Cardiology) Burna Sis, LCSW as Social Worker (Licensed Clinical Social Worker)  Patient Active Problem List   Diagnosis Date Noted  . Elevated liver enzymes 02/06/2020  . CKD (chronic kidney disease) stage 3, GFR 30-59 ml/min 02/06/2020  . Diabetes mellitus type 2 in obese (HCC) 02/06/2020  . Vitamin D deficiency 02/06/2020  . Dyslipidemia (high LDL; low HDL) 02/06/2020  . Hypertriglyceridemia 02/06/2020  . Morbid obesity (HCC) 02/05/2020  . Acute gout of left ankle 01/04/2020  . AKI (acute kidney injury) (HCC) 11/10/2019  . Hyperkalemia 11/10/2019  . Acute exacerbation of CHF (congestive heart failure) (HCC) 07/15/2019  . Lipodystrophy 10/21/2018  . Chronic systolic heart failure (HCC) 02/17/2018  . Fatigue 02/17/2018  . Cardiomyopathy (HCC) 02/11/2018  . Hypertensive crisis 02/04/2018  . Chest pain 02/04/2018  . Allergic rhinitis 01/21/2018  . Hyperglycemia 12/02/2017  . Hypokalemia 12/02/2017  . Cough 12/02/2017  . Patellar tendonitis of left knee 12/02/2017  . Preventative health care 12/10/2016  . Obesity 05/25/2016  . Lower leg edema 12/12/2014  . Essential hypertension 11/05/2014  . OSA (obstructive sleep apnea) 11/05/2014    Current Outpatient Medications:  .  allopurinol (ZYLOPRIM) 100 MG tablet, Take 1 tablet (100 mg total) by mouth 2 (two) times daily., Disp: 60 tablet, Rfl: 3 .  carvedilol (COREG) 12.5 MG tablet, TAKE 1 TABLET BY MOUTH TWICE DAILY WITH A MEAL, Disp: 180 tablet, Rfl: 2 .  fenofibrate 160 MG tablet, Take 1 tablet (160 mg total) by mouth daily., Disp: 90 tablet, Rfl: 2 .  hydrALAZINE (APRESOLINE) 100 MG tablet, Take 1 tablet (100 mg total) by mouth every 8 (eight) hours., Disp: 90 tablet, Rfl: 11 .  omega-3 acid ethyl  esters (LOVAZA) 1 g capsule, Take 2 capsules (2 g total) by mouth 2 (two) times daily., Disp: 120 capsule, Rfl: 6 .  sacubitril-valsartan (ENTRESTO) 24-26 MG, Take 1 tablet by mouth 2 (two) times daily., Disp: 60 tablet, Rfl: 3 .  spironolactone (ALDACTONE) 25 MG tablet, Take 0.5 tablets (12.5 mg total) by mouth daily., Disp: 45 tablet, Rfl: 3 .  torsemide (DEMADEX) 20 MG tablet, Take 1 tablet (20 mg total) by mouth daily., Disp: 90 tablet, Rfl: 3 .  colchicine 0.6 MG tablet, Take 1 tablet (0.6 mg total) by mouth daily. (Patient not taking: Reported on 02/09/2020), Disp: 30 tablet, Rfl: 5 No Known Allergies    Social History   Socioeconomic History  . Marital status: Single    Spouse name: Not on file  . Number of children: 1  . Years of education: 35  . Highest education level: Not on file  Occupational History  . 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 Smoking status: Never Smoker  . Smokeless tobacco: Never Used  Substance and Sexual Activity  . Alcohol use: No  . Drug use: No  . Sexual activity: Yes    Birth control/protection: Condom  Other Topics Concern  . Not on file  Social History Narrative   Born and raised in [place], [place]. Currently live in a private residence with his mother. Fun: Play with his daughter.   Denies religious beliefs that would effect health care.    Social Determinants of Health  Financial Resource Strain: Low Risk   . Difficulty of Paying Living Expenses: Not very hard  Food Insecurity: Food Insecurity Present  . Worried About Charity fundraiser in the Last Year: Sometimes true  . Ran Out of Food in the Last Year: Sometimes true  Transportation Needs: No Transportation Needs  . Lack of Transportation (Medical): No  . Lack of Transportation (Non-Medical): No  Physical Activity:   . Days of Exercise per Week:   . Minutes of Exercise per Session:   Stress:   . Feeling of Stress :   Social Connections:   .  Frequency of Communication with Friends and Family:   . Frequency of Social Gatherings with Friends and Family:   . Attends Religious Services:   . Active Member of Clubs or Organizations:   . Attends Archivist Meetings:   Marland Kitchen Marital Status:   Intimate Partner Violence:   . Fear of Current or Ex-Partner:   . Emotionally Abused:   Marland Kitchen Physically Abused:   . Sexually Abused:     Physical Exam Cardiovascular:     Rate and Rhythm: Normal rate and regular rhythm.     Pulses: Normal pulses.  Pulmonary:     Effort: Pulmonary effort is normal.     Breath sounds: Normal breath sounds.  Musculoskeletal:        General: Normal range of motion.     Right lower leg: Edema present.     Left lower leg: Edema present.  Skin:    General: Skin is warm and dry.     Capillary Refill: Capillary refill takes less than 2 seconds.  Neurological:     Mental Status: He is alert and oriented to person, place, and time.  Psychiatric:        Mood and Affect: Mood normal.         Future Appointments  Date Time Provider [place]  02/19/2020  2:40 PM Georgia Lopes, DO [facility] None  03/11/2020  2:00 PM Bensimhon, Shaune Pascal, MD [facility] None  04/10/2020  3:00 PM Biagio Borg, MD [facility] None    BP (!) 130/100 (BP Location: Left Arm, Patient Position: Sitting, Cuff Size: Normal)   Pulse 92   Wt 248 lb (112.5 kg)   SpO2 95%   BMI 40.03 kg/m   Weight yesterday- did not weigh  Last visit weight- 246 lb  Steven Chase was seen at home today and reported feeling well. He denied chest pain, SOB, headache, dizziness, orthopnea, fever or cough over the past week. He reported being compliant with his medications over the past week and his weight has been generally stable. His blood pressure continues to fluctuate so I contacted the HF clinic. Per Dr Aundra Dubin, he is to increase carvedilol to 18.75 mg today and see how his BP responds. He will be at work the rest of the day but stated he has someone  there who can check his BP and her will relay that information to me around 14:30.   Jacquiline Doe, EMT 02/16/20  ACTION: Home visit completed Next visit planned for Tuesday

## 2020-02-16 NOTE — Progress Notes (Signed)
Paramedicine Encounter    Patient ID: Steven Chase, male    DOB: Jul 19, 1986, 34 y.o.   MRN: [id number]   Patient Care Team: Biagio Borg, MD as PCP - General (Internal Medicine) Nahser, Wonda Cheng, MD as PCP - Cardiology (Cardiology) Jorge Ny, LCSW as Social Worker (Licensed Clinical Social Worker)  Patient Active Problem List   Diagnosis Date Noted  . Elevated liver enzymes 02/06/2020  . CKD (chronic kidney disease) stage 3, GFR 30-59 ml/min 02/06/2020  . Diabetes mellitus type 2 in obese ([place]) 02/06/2020  . Vitamin D deficiency 02/06/2020  . Dyslipidemia (high LDL; low HDL) 02/06/2020  . Hypertriglyceridemia 02/06/2020  . Morbid obesity ([place]) 02/05/2020  . Acute gout of left ankle 01/04/2020  . AKI (acute kidney injury) ([place]) 11/10/2019  . Hyperkalemia 11/10/2019  . Acute exacerbation of CHF (congestive heart failure) ([place]) 07/15/2019  . Lipodystrophy 10/21/2018  . Chronic systolic heart failure ([place]) 02/17/2018  . Fatigue 02/17/2018  . Cardiomyopathy ([place]) 02/11/2018  . Hypertensive crisis 02/04/2018  . Chest pain 02/04/2018  . Allergic rhinitis 01/21/2018  . Hyperglycemia 12/02/2017  . Hypokalemia 12/02/2017  . Cough 12/02/2017  . Patellar tendonitis of left knee 12/02/2017  . Preventative health care 12/10/2016  . Obesity 05/25/2016  . Lower leg edema 12/12/2014  . Essential hypertension 11/05/2014  . OSA (obstructive sleep apnea) 11/05/2014    Current Outpatient Medications:  .  allopurinol (ZYLOPRIM) 100 MG tablet, Take 1 tablet (100 mg total) by mouth 2 (two) times daily., Disp: 60 tablet, Rfl: 3 .  carvedilol (COREG) 12.5 MG tablet, TAKE 1 TABLET BY MOUTH TWICE DAILY WITH A MEAL, Disp: 180 tablet, Rfl: 2 .  colchicine 0.6 MG tablet, Take 1 tablet (0.6 mg total) by mouth daily. (Patient not taking: Reported on 02/09/2020), Disp: 30 tablet, Rfl: 5 .  fenofibrate 160 MG tablet, Take 1 tablet (160 mg total) by mouth daily., Disp: 90 tablet, Rfl: 2 .   hydrALAZINE (APRESOLINE) 100 MG tablet, Take 1 tablet (100 mg total) by mouth every 8 (eight) hours., Disp: 90 tablet, Rfl: 11 .  omega-3 acid ethyl esters (LOVAZA) 1 g capsule, Take 2 capsules (2 g total) by mouth 2 (two) times daily., Disp: 120 capsule, Rfl: 6 .  sacubitril-valsartan (ENTRESTO) 24-26 MG, Take 1 tablet by mouth 2 (two) times daily., Disp: 60 tablet, Rfl: 3 .  spironolactone (ALDACTONE) 25 MG tablet, Take 0.5 tablets (12.5 mg total) by mouth daily., Disp: 45 tablet, Rfl: 3 .  torsemide (DEMADEX) 20 MG tablet, Take 1 tablet (20 mg total) by mouth daily., Disp: 90 tablet, Rfl: 3 No Known Allergies    Social History   Socioeconomic History  . Marital status: Single    Spouse name: Not on file  . Number of children: 1  . Years of education: 1  . Highest education level: Not on file  Occupational History  . Occupation: Research scientist (physical sciences): [employer]  Tobacco Use  . Smoking status: Never Smoker  . Smokeless tobacco: Never Used  Substance and Sexual Activity  . Alcohol use: No  . Drug use: No  . Sexual activity: Yes    Birth control/protection: Condom  Other Topics Concern  . Not on file  Social History Narrative   Born and raised in [place], [place]. Currently live in a private residence with his mother. Fun: Play with his daughter.   Denies religious beliefs that would effect health care.    Social Determinants of Health  Financial Resource Strain: Low Risk   . Difficulty of Paying Living Expenses: Not very hard  Food Insecurity: Food Insecurity Present  . Worried About Programme researcher, broadcasting/film/video in the Last Year: Sometimes true  . Ran Out of Food in the Last Year: Sometimes true  Transportation Needs: No Transportation Needs  . Lack of Transportation (Medical): No  . Lack of Transportation (Non-Medical): No  Physical Activity:   . Days of Exercise per Week:   . Minutes of Exercise per Session:   Stress:   . Feeling of Stress :   Social Connections:    . Frequency of Communication with Friends and Family:   . Frequency of Social Gatherings with Friends and Family:   . Attends Religious Services:   . Active Member of Clubs or Organizations:   . Attends Banker Meetings:   Marland Kitchen Marital Status:   Intimate Partner Violence:   . Fear of Current or Ex-Partner:   . Emotionally Abused:   Marland Kitchen Physically Abused:   . Sexually Abused:     Physical Exam      Future Appointments  Date Time Provider Department Center  02/19/2020  2:40 PM Roswell Nickel, DO [facility] None  03/11/2020  2:00 PM Bensimhon, Bevelyn Buckles, MD [facility] None  04/10/2020  3:00 PM Corwin Levins, MD [facility] None    BP (!) 130/96 (BP Location: Left Arm, Patient Position: Sitting, Cuff Size: Normal)   Pulse 76   Resp 16   SpO2 98%   I returned to Mr Gloover's house to reassess his blood pressure. He reported having taken an extra half tablet of carvedilol. He was still hypertensive so I contacted the HF clinic and per Dr Shirlee Latch, no changes are to be made at this time. I will follow up next week.   [name], EMT 02/16/20  ACTION: Home visit completed Next visit planned for 1 week

## 2020-02-19 ENCOUNTER — Telehealth: Payer: Self-pay

## 2020-02-19 ENCOUNTER — Ambulatory Visit (INDEPENDENT_AMBULATORY_CARE_PROVIDER_SITE_OTHER): Payer: Self-pay | Admitting: Bariatrics

## 2020-02-19 MED ORDER — COLCHICINE 0.6 MG PO TABS: 0.6000 mg | ORAL_TABLET | Freq: Every day | ORAL | 5 refills | Status: DC

## 2020-02-19 NOTE — Telephone Encounter (Signed)
Reviewed chart pt is up-to-date sent refills to [facility]  

## 2020-02-19 NOTE — Telephone Encounter (Signed)
  1.Medication Requested:colchicine 0.6 MG tablet  2. Pharmacy (Name, Street, [place]):[id number] - [place], [place] - [address]  3. On Med List: Yes   4. Last Visit with PCP: 3.4.2021   5. Next visit date with PCP: no appt is made at this time    Agent: Please be advised that RX refills may take up to 3 business days. We ask that you follow-up with your pharmacy.

## 2020-02-20 ENCOUNTER — Encounter: Payer: Self-pay | Admitting: Internal Medicine

## 2020-02-20 ENCOUNTER — Other Ambulatory Visit (HOSPITAL_COMMUNITY): Payer: Self-pay

## 2020-02-20 ENCOUNTER — Other Ambulatory Visit: Payer: Self-pay

## 2020-02-20 ENCOUNTER — Ambulatory Visit: Payer: 59 | Admitting: Internal Medicine

## 2020-02-20 VITALS — BP 140/100 | HR 95 | Temp 98.1°F | Ht 66.0 in | Wt 259.0 lb

## 2020-02-20 DIAGNOSIS — Z0000 Encounter for general adult medical examination without abnormal findings: Secondary | ICD-10-CM

## 2020-02-20 DIAGNOSIS — E785 Hyperlipidemia, unspecified: Secondary | ICD-10-CM | POA: Diagnosis not present

## 2020-02-20 DIAGNOSIS — E669 Obesity, unspecified: Secondary | ICD-10-CM

## 2020-02-20 DIAGNOSIS — Z0001 Encounter for general adult medical examination with abnormal findings: Secondary | ICD-10-CM

## 2020-02-20 DIAGNOSIS — E1169 Type 2 diabetes mellitus with other specified complication: Secondary | ICD-10-CM | POA: Diagnosis not present

## 2020-02-20 DIAGNOSIS — M109 Gout, unspecified: Secondary | ICD-10-CM | POA: Diagnosis not present

## 2020-02-20 DIAGNOSIS — E559 Vitamin D deficiency, unspecified: Secondary | ICD-10-CM

## 2020-02-20 DIAGNOSIS — I10 Essential (primary) hypertension: Secondary | ICD-10-CM

## 2020-02-20 MED ORDER — METHYLPREDNISOLONE ACETATE 80 MG/ML IJ SUSP
80.0000 mg | Freq: Once | INTRAMUSCULAR | Status: AC
  Administered 2020-02-20: 80 mg via INTRAMUSCULAR

## 2020-02-20 MED ORDER — VITAMIN D (ERGOCALCIFEROL) 1.25 MG (50000 UNIT) PO CAPS: 50000.0000 [IU] | ORAL_CAPSULE | ORAL | 0 refills | Status: DC

## 2020-02-20 MED ORDER — ROSUVASTATIN CALCIUM 40 MG PO TABS: 40.0000 mg | ORAL_TABLET | Freq: Every day | ORAL | 3 refills | Status: DC

## 2020-02-20 MED ORDER — ROSUVASTATIN CALCIUM 40 MG PO TABS: 40.0000 mg | ORAL_TABLET | Freq: Every day | ORAL | 3 refills | Status: AC

## 2020-02-20 MED ORDER — PREDNISONE 10 MG PO TABS: ORAL_TABLET | ORAL | 0 refills | Status: DC

## 2020-02-20 NOTE — Progress Notes (Signed)
Subjective:    Patient ID: Steven Chase, male    DOB: 11-12-1985, 34 y.o.   MRN: [id number]  HPI  Here for wellness and f/u;  Overall doing ok;  Pt denies Chest pain, worsening SOB, DOE, wheezing, orthopnea, PND, worsening LE edema, palpitations, dizziness or syncope.  Pt denies neurological change such as new headache, facial or extremity weakness.  Pt denies polydipsia, polyuria, or low sugar symptoms. Pt states overall good compliance with treatment and medications, good tolerability, and has been trying to follow appropriate diet.  Pt denies worsening depressive symptoms, suicidal ideation or panic. No fever, night sweats, wt loss, loss of appetite, or other constitutional symptoms.  Pt states good ability with ADL's, has low fall risk, home safety reviewed and adequate, no other significant changes in hearing or vision, and only occasionally active with exercise.  Also with c/o 1 wk worsening right ankle pain despite good med compliance.  Also hasd recent low vit d.  Due for optho eval for dm Past Medical History:  Diagnosis Date  . CHF (congestive heart failure) ([place])   . Edema of both lower extremities   . Gout   . High cholesterol   . Hypertension   . Obesity   . OSA (obstructive sleep apnea) 11/05/2014   02/2015 -severe -AHI 106/h    Past Surgical History:  Procedure Laterality Date  . HERNIA REPAIR    . RIGHT/LEFT HEART CATH AND CORONARY ANGIOGRAPHY N/A 02/07/2018   Procedure: RIGHT/LEFT HEART CATH AND CORONARY ANGIOGRAPHY;  Surgeon: Leonie Man, MD;  Location: [facility];  Service: Cardiovascular;  Laterality: N/A;    reports that he has never smoked. He has never used smokeless tobacco. He reports that he does not drink alcohol or use drugs. family history includes Anxiety disorder in his mother; Depression in his mother; Heart disease in his maternal grandfather; High Cholesterol in his father and mother; High blood pressure in his father; Hypertension in his  maternal grandfather, mother, and sister; Obesity in his father and mother; Sleep apnea in his mother. No Known Allergies Current Outpatient Medications on File Prior to Visit  Medication Sig Dispense Refill  . carvedilol (COREG) 12.5 MG tablet TAKE 1 TABLET BY MOUTH TWICE DAILY WITH A MEAL 180 tablet 2  . colchicine 0.6 MG tablet Take 1 tablet (0.6 mg total) by mouth daily. 30 tablet 5  . fenofibrate 160 MG tablet Take 1 tablet (160 mg total) by mouth daily. 90 tablet 2  . hydrALAZINE (APRESOLINE) 100 MG tablet Take 1 tablet (100 mg total) by mouth every 8 (eight) hours. 90 tablet 11  . isosorbide mononitrate (IMDUR) 60 MG 24 hr tablet Take 60 mg by mouth daily.    Marland Kitchen omega-3 acid ethyl esters (LOVAZA) 1 g capsule Take 2 capsules (2 g total) by mouth 2 (two) times daily. 120 capsule 6  . sacubitril-valsartan (ENTRESTO) 24-26 MG Take 1 tablet by mouth 2 (two) times daily. 60 tablet 3  . allopurinol (ZYLOPRIM) 100 MG tablet Take 1 tablet (100 mg total) by mouth 2 (two) times daily. 60 tablet 3  . spironolactone (ALDACTONE) 25 MG tablet Take 0.5 tablets (12.5 mg total) by mouth daily. 45 tablet 3  . torsemide (DEMADEX) 20 MG tablet Take 1 tablet (20 mg total) by mouth daily. 90 tablet 3   No current facility-administered medications on file prior to visit.   Review of Systems All otherwise neg per pt     Objective:   Physical  Exam BP (!) 140/100 (BP Location: Left Arm, Patient Position: Sitting, Cuff Size: Large)   Pulse 95   Temp 98.1 F (36.7 C) (Oral)   Ht 5\' 6"  (1.676 m)   Wt 259 lb (117.5 kg)   SpO2 98%   BMI 41.80 kg/m  VS noted,  Constitutional: Pt appears in NAD HENT: Head: NCAT.  Right Ear: External ear normal.  Left Ear: External ear normal.  Eyes: . Pupils are equal, round, and reactive to light. Conjunctivae and EOM are normal Nose: without d/c or deformity Neck: Neck supple. Gross normal ROM Cardiovascular: Normal rate and regular rhythm.   Pulmonary/Chest: Effort  normal and breath sounds without rales or wheezing.  Abd:  Soft, NT, ND, + BS, no organomegaly Right ankle with `1+ effusion tender Neurological: Pt is alert. At baseline orientation, motor grossly intact Skin: Skin is warm. No rashes, other new lesions, no LE edema Psychiatric: Pt behavior is normal without agitation  All otherwise neg per pt Lab Results  Component Value Date   WBC 11.7 (H) 11/13/2019   HGB 10.4 (L) 11/13/2019   HCT 30.2 (L) 11/13/2019   PLT 271 11/13/2019   GLUCOSE 113 (H) 02/05/2020   CHOL 270 (H) 02/05/2020   TRIG 347 (H) 02/05/2020   HDL 36 (L) 02/05/2020   LDLDIRECT 77.6 11/12/2019   LDLCALC 167 (H) 02/05/2020   ALT 56 (H) 02/05/2020   AST 47 (H) 02/05/2020   NA 142 02/05/2020   K 3.2 (L) 02/05/2020   CL 93 (L) 02/05/2020   CREATININE 2.45 (H) 02/05/2020   BUN 27 (H) 02/05/2020   CO2 28 02/05/2020   TSH 2.880 02/05/2020   INR 1.24 02/06/2018   HGBA1C 5.7 (H) 02/05/2020   MICROALBUR 0.42 08/24/2008         Assessment & Plan:

## 2020-02-20 NOTE — Assessment & Plan Note (Signed)
For oral replacement 

## 2020-02-20 NOTE — Assessment & Plan Note (Signed)
stable overall by history and exam, recent data reviewed with pt, and pt to continue medical treatment as before,  to f/u any worsening symptoms or concerns  

## 2020-02-20 NOTE — Assessment & Plan Note (Addendum)
Mild to mod, for depomedrol im 80, and predpac asd,  to f/u any worsening symptoms or concerns  I spent 32 minutes in addition to time for CPX wellness examination in preparing to see the patient by review of recent labs, imaging and procedures, obtaining and reviewing separately obtained history, communicating with the patient and family or caregiver, ordering medications, tests or procedures, and documenting clinical information in the EHR including the differential Dx, treatment, and any further evaluation and other management of acute gout left ankle, dm, vit d deficiency, htn

## 2020-02-20 NOTE — Progress Notes (Signed)
Steven Chase was seen at home briefly today after seeing his PCP. He was started on rosuvastatin so I contacted Karle Plumber, PharmD, to see if there was any reason for concern and she advised it would be appropriate for him to take. He had not picked up Lovaza so I will return on Thursday morning to fill his pillbox and do a thorough assessment.   [name], EMT 02/20/20

## 2020-02-20 NOTE — Assessment & Plan Note (Signed)
Declines further med tx today

## 2020-02-20 NOTE — Patient Instructions (Addendum)
You had the steroid shot today  Please take all new medication as prescribed - the pred[facility]one (to [facility])  Ok to restart the crestor sent to [facility]  Please take all new medication as prescribed - Please take Vitamin D 06237 units weekly for 12 weeks, then plan to change to OTC Vitamin D3 at 2000 units per day, indefinitely.  You will be contacted regarding the referral for: eye doctor for yearly eye exam  Please continue all other medications as before, and refills have been done if requested.  Please have the pharmacy call with any other refills you may need.  Please continue your efforts at being more active, low cholesterol diet, and weight control.  You are otherwise up to date with prevention measures today.  Please keep your appointments with your specialists as you may have planned  Your lab work was done recently  Please make an Appointment to return in 6 months, or sooner if needed

## 2020-02-20 NOTE — Assessment & Plan Note (Signed)

## 2020-02-22 ENCOUNTER — Other Ambulatory Visit (HOSPITAL_COMMUNITY): Payer: Self-pay

## 2020-02-22 NOTE — Progress Notes (Signed)
Paramedicine Encounter    Patient ID: Steven Chase, male    DOB: [birth year], 34 y.o.   MRN: [id number]   Patient Care Team: Corwin Levins, MD as PCP - General (Internal Medicine) Nahser, Deloris Ping, MD as PCP - Cardiology (Cardiology) Burna Sis, LCSW as Social Worker (Licensed Clinical Social Worker)  Patient Active Problem List   Diagnosis Date Noted  . Elevated liver enzymes 02/06/2020  . CKD (chronic kidney disease) stage 3, GFR 30-59 ml/min 02/06/2020  . Diabetes mellitus type 2 in obese (HCC) 02/06/2020  . Vitamin D deficiency 02/06/2020  . Dyslipidemia (high LDL; low HDL) 02/06/2020  . Hypertriglyceridemia 02/06/2020  . Morbid obesity (HCC) 02/05/2020  . Acute gout of left ankle 01/04/2020  . AKI (acute kidney injury) (HCC) 11/10/2019  . Hyperkalemia 11/10/2019  . Acute exacerbation of CHF (congestive heart failure) (HCC) 07/15/2019  . Lipodystrophy 10/21/2018  . Chronic systolic heart failure (HCC) 02/17/2018  . Fatigue 02/17/2018  . Cardiomyopathy (HCC) 02/11/2018  . Hypertensive crisis 02/04/2018  . Chest pain 02/04/2018  . Allergic rhinitis 01/21/2018  . Hyperglycemia 12/02/2017  . Hypokalemia 12/02/2017  . Cough 12/02/2017  . Patellar tendonitis of left knee 12/02/2017  . Encounter for well adult exam with abnormal findings 12/10/2016  . Obesity 05/25/2016  . Lower leg edema 12/12/2014  . Essential hypertension 11/05/2014  . OSA (obstructive sleep apnea) 11/05/2014    Current Outpatient Medications:  .  allopurinol (ZYLOPRIM) 100 MG tablet, Take 1 tablet (100 mg total) by mouth 2 (two) times daily., Disp: 60 tablet, Rfl: 3 .  carvedilol (COREG) 12.5 MG tablet, TAKE 1 TABLET BY MOUTH TWICE DAILY WITH A MEAL, Disp: 180 tablet, Rfl: 2 .  colchicine 0.6 MG tablet, Take 1 tablet (0.6 mg total) by mouth daily., Disp: 30 tablet, Rfl: 5 .  fenofibrate 160 MG tablet, Take 1 tablet (160 mg total) by mouth daily., Disp: 90 tablet, Rfl: 2 .  hydrALAZINE (APRESOLINE)  100 MG tablet, Take 1 tablet (100 mg total) by mouth every 8 (eight) hours., Disp: 90 tablet, Rfl: 11 .  isosorbide mononitrate (IMDUR) 60 MG 24 hr tablet, Take 60 mg by mouth daily., Disp: , Rfl:  .  omega-3 acid ethyl esters (LOVAZA) 1 g capsule, Take 2 capsules (2 g total) by mouth 2 (two) times daily., Disp: 120 capsule, Rfl: 6 .  predniSONE (DELTASONE) 10 MG tablet, 3 tabs by mouth per day for 3 days,2tabs per day for 3 days,1tab per day for 3 days, Disp: 18 tablet, Rfl: 0 .  rosuvastatin (CRESTOR) 40 MG tablet, Take 1 tablet (40 mg total) by mouth daily at 6 PM., Disp: 90 tablet, Rfl: 3 .  sacubitril-valsartan (ENTRESTO) 24-26 MG, Take 1 tablet by mouth 2 (two) times daily., Disp: 60 tablet, Rfl: 3 .  spironolactone (ALDACTONE) 25 MG tablet, Take 0.5 tablets (12.5 mg total) by mouth daily., Disp: 45 tablet, Rfl: 3 .  torsemide (DEMADEX) 20 MG tablet, Take 1 tablet (20 mg total) by mouth daily., Disp: 90 tablet, Rfl: 3 .  Vitamin D, Ergocalciferol, (DRISDOL) 1.25 MG (50000 UNIT) CAPS capsule, Take 1 capsule (50,000 Units total) by mouth every 7 (seven) days., Disp: 12 capsule, Rfl: 0 No Known Allergies    Social History   Socioeconomic History  . Marital status: Single    Spouse name: Not on file  . Number of children: 1  . Years of education: 66  . Highest education level: Not on file  Occupational History  .  Occupation: Research scientist (physical sciences): [employer]  Tobacco Use  . Smoking status: Never Smoker  . Smokeless tobacco: Never Used  Substance and Sexual Activity  . Alcohol use: No  . Drug use: No  . Sexual activity: Yes    Birth control/protection: Condom  Other Topics Concern  . Not on file  Social History Narrative   Born and raised in [place], [place]. Currently live in a private residence with his mother. Fun: Play with his daughter.   Denies religious beliefs that would effect health care.    Social Determinants of Health   Financial Resource Strain: Low Risk    . Difficulty of Paying Living Expenses: Not very hard  Food Insecurity: Food Insecurity Present  . Worried About Charity fundraiser in the Last Year: Sometimes true  . Ran Out of Food in the Last Year: Sometimes true  Transportation Needs: No Transportation Needs  . Lack of Transportation (Medical): No  . Lack of Transportation (Non-Medical): No  Physical Activity:   . Days of Exercise per Week:   . Minutes of Exercise per Session:   Stress:   . Feeling of Stress :   Social Connections:   . Frequency of Communication with Friends and Family:   . Frequency of Social Gatherings with Friends and Family:   . Attends Religious Services:   . Active Member of Clubs or Organizations:   . Attends Archivist Meetings:   Marland Kitchen Marital Status:   Intimate Partner Violence:   . Fear of Current or Ex-Partner:   . Emotionally Abused:   Marland Kitchen Physically Abused:   . Sexually Abused:     Physical Exam Cardiovascular:     Rate and Rhythm: Normal rate and regular rhythm.     Pulses: Normal pulses.  Pulmonary:     Effort: Pulmonary effort is normal.     Breath sounds: Normal breath sounds.  Musculoskeletal:        General: Normal range of motion.     Right lower leg: Edema present.     Left lower leg: Edema present.  Skin:    Capillary Refill: Capillary refill takes less than 2 seconds.  Neurological:     Mental Status: He is alert and oriented to person, place, and time.  Psychiatric:        Mood and Affect: Mood normal.         Future Appointments  Date Time Provider [place]  03/11/2020  2:00 PM Bensimhon, Shaune Pascal, MD [facility] None  04/10/2020  3:00 PM Biagio Borg, MD [facility] None    BP (!) 180/130 (BP Location: Left Arm, Patient Position: Sitting, Cuff Size: Normal)   Pulse 94   Wt 255 lb (115.7 kg)   SpO2 96%   BMI 41.16 kg/m   Weight yesterday- did not weigh Last visit weight- 259 lb  Mr Ballinas was seen at home today and reported feeling well. He denied  chest pain, SOB, headache, dizziness, orthopnea, fever or cough since our last visit. He stated he has been compliant with his medications over the past week and his weight has been stable. Upon assessing his blood pressure he was found to be hypertensive. He stated he had just taken his medications when I arrived this morning. I requested that he take his medications tomorrow morning at 07:00 and I would come by at 08:30 to recheck his blood pressure. He was understanding and agreeable. I will follow up tomorrow.   Toula Moos  Laurita Quint, EMT 02/22/20  ACTION: Home visit completed Next visit planned for tomorrow morning.

## 2020-02-23 ENCOUNTER — Encounter: Payer: Self-pay | Admitting: Internal Medicine

## 2020-02-23 ENCOUNTER — Other Ambulatory Visit (HOSPITAL_COMMUNITY): Payer: Self-pay

## 2020-02-23 ENCOUNTER — Telehealth: Payer: Self-pay

## 2020-02-23 NOTE — Progress Notes (Signed)
Steven Chase was seen at home today to check his vital signs after being hypertensive yesterday. He stated he took his medications last night after getting home at 23:00 and then again around 07:00 this morning since I was coming at ~08:30. He was found to be hypotensive but asymptomatic. I advised that he should be taking his morning and evening doses about 12 hours apart to prevent episodes of hypotension. He was understanding and agreeable. I advised that I would like to see him mid day next week sometime to assess his blood pressure when he has been on his medications but it would allow him to leave 12 hours between his evening and morning doses. He was understanding and agreeable. I will follow up on Thursday of next week.   [name], EMT  02/23/20

## 2020-02-23 NOTE — Telephone Encounter (Signed)
Ok with me 

## 2020-02-23 NOTE — Telephone Encounter (Signed)
Key: [id number]  Mitigare may be the preferred. If so, okay to replace?

## 2020-02-26 MED ORDER — MITIGARE 0.6 MG PO CAPS: 1.0000 | ORAL_CAPSULE | Freq: Every day | ORAL | 5 refills | Status: DC

## 2020-02-26 NOTE — Telephone Encounter (Signed)
PA for colchicine was denied.   Sending in for name brand Mitigare per okay from PCP.

## 2020-03-01 ENCOUNTER — Other Ambulatory Visit (HOSPITAL_COMMUNITY): Payer: Self-pay

## 2020-03-01 NOTE — Progress Notes (Signed)
Paramedicine Encounter    Patient ID: Steven Chase, male    DOB: 1986-04-26, 34 y.o.   MRN: [id number]   Patient Care Team: Corwin Levins, MD as PCP - General (Internal Medicine) Nahser, Deloris Ping, MD as PCP - Cardiology (Cardiology) Burna Sis, LCSW as Social Worker (Licensed Clinical Social Worker)  Patient Active Problem List   Diagnosis Date Noted  . Elevated liver enzymes 02/06/2020  . CKD (chronic kidney disease) stage 3, GFR 30-59 ml/min 02/06/2020  . Diabetes mellitus type 2 in obese (HCC) 02/06/2020  . Vitamin D deficiency 02/06/2020  . Dyslipidemia (high LDL; low HDL) 02/06/2020  . Hypertriglyceridemia 02/06/2020  . Morbid obesity (HCC) 02/05/2020  . Acute gout of left ankle 01/04/2020  . AKI (acute kidney injury) (HCC) 11/10/2019  . Hyperkalemia 11/10/2019  . Acute exacerbation of CHF (congestive heart failure) (HCC) 07/15/2019  . Lipodystrophy 10/21/2018  . Chronic systolic heart failure (HCC) 02/17/2018  . Fatigue 02/17/2018  . Cardiomyopathy (HCC) 02/11/2018  . Hypertensive crisis 02/04/2018  . Chest pain 02/04/2018  . Allergic rhinitis 01/21/2018  . Hyperglycemia 12/02/2017  . Hypokalemia 12/02/2017  . Cough 12/02/2017  . Patellar tendonitis of left knee 12/02/2017  . Encounter for well adult exam with abnormal findings 12/10/2016  . Obesity 05/25/2016  . Lower leg edema 12/12/2014  . Essential hypertension 11/05/2014  . OSA (obstructive sleep apnea) 11/05/2014    Current Outpatient Medications:  .  allopurinol (ZYLOPRIM) 100 MG tablet, Take 1 tablet (100 mg total) by mouth 2 (two) times daily., Disp: 60 tablet, Rfl: 3 .  carvedilol (COREG) 12.5 MG tablet, TAKE 1 TABLET BY MOUTH TWICE DAILY WITH A MEAL, Disp: 180 tablet, Rfl: 2 .  fenofibrate 160 MG tablet, Take 1 tablet (160 mg total) by mouth daily., Disp: 90 tablet, Rfl: 2 .  hydrALAZINE (APRESOLINE) 100 MG tablet, Take 1 tablet (100 mg total) by mouth every 8 (eight) hours., Disp: 90 tablet, Rfl:  11 .  isosorbide mononitrate (IMDUR) 60 MG 24 hr tablet, Take 60 mg by mouth daily., Disp: , Rfl:  .  MITIGARE 0.6 MG CAPS, Take 1 capsule by mouth daily., Disp: 30 capsule, Rfl: 5 .  omega-3 acid ethyl esters (LOVAZA) 1 g capsule, Take 2 capsules (2 g total) by mouth 2 (two) times daily., Disp: 120 capsule, Rfl: 6 .  rosuvastatin (CRESTOR) 40 MG tablet, Take 1 tablet (40 mg total) by mouth daily at 6 PM., Disp: 90 tablet, Rfl: 3 .  sacubitril-valsartan (ENTRESTO) 24-26 MG, Take 1 tablet by mouth 2 (two) times daily., Disp: 60 tablet, Rfl: 3 .  spironolactone (ALDACTONE) 25 MG tablet, Take 0.5 tablets (12.5 mg total) by mouth daily., Disp: 45 tablet, Rfl: 3 .  torsemide (DEMADEX) 20 MG tablet, Take 1 tablet (20 mg total) by mouth daily., Disp: 90 tablet, Rfl: 3 .  Vitamin D, Ergocalciferol, (DRISDOL) 1.25 MG (50000 UNIT) CAPS capsule, Take 1 capsule (50,000 Units total) by mouth every 7 (seven) days., Disp: 12 capsule, Rfl: 0 .  predniSONE (DELTASONE) 10 MG tablet, 3 tabs by mouth per day for 3 days,2tabs per day for 3 days,1tab per day for 3 days (Patient not taking: Reported on 03/01/2020), Disp: 18 tablet, Rfl: 0 No Known Allergies    Social History   Socioeconomic History  . Marital status: Single    Spouse name: Not on file  . Number of children: 1  . Years of education: 92  . Highest education level: Not on file  Occupational  History  . Occupation: Research scientist (physical sciences): [employer]  Tobacco Use  . Smoking status: Never Smoker  . Smokeless tobacco: Never Used  Substance and Sexual Activity  . Alcohol use: No  . Drug use: No  . Sexual activity: Yes    Birth control/protection: Condom  Other Topics Concern  . Not on file  Social History Narrative   Born and raised in [place], [place]. Currently live in a private residence with his mother. Fun: Play with his daughter.   Denies religious beliefs that would effect health care.    Social Determinants of Health   Financial  Resource Strain: Low Risk   . Difficulty of Paying Living Expenses: Not very hard  Food Insecurity: Food Insecurity Present  . Worried About Charity fundraiser in the Last Year: Sometimes true  . Ran Out of Food in the Last Year: Sometimes true  Transportation Needs: No Transportation Needs  . Lack of Transportation (Medical): No  . Lack of Transportation (Non-Medical): No  Physical Activity:   . Days of Exercise per Week:   . Minutes of Exercise per Session:   Stress:   . Feeling of Stress :   Social Connections:   . Frequency of Communication with Friends and Family:   . Frequency of Social Gatherings with Friends and Family:   . Attends Religious Services:   . Active Member of Clubs or Organizations:   . Attends Archivist Meetings:   Marland Kitchen Marital Status:   Intimate Partner Violence:   . Fear of Current or Ex-Partner:   . Emotionally Abused:   Marland Kitchen Physically Abused:   . Sexually Abused:     Physical Exam Cardiovascular:     Rate and Rhythm: Normal rate and regular rhythm.     Pulses: Normal pulses.  Pulmonary:     Effort: Pulmonary effort is normal.     Breath sounds: Normal breath sounds.  Musculoskeletal:        General: Normal range of motion.     Right lower leg: No edema.     Left lower leg: No edema.  Skin:    General: Skin is warm and dry.     Capillary Refill: Capillary refill takes less than 2 seconds.  Neurological:     Mental Status: He is alert and oriented to person, place, and time.  Psychiatric:        Mood and Affect: Mood normal.         Future Appointments  Date Time Provider [place]  03/11/2020  2:00 PM Bensimhon, Shaune Pascal, MD [facility] None  04/10/2020  3:00 PM Biagio Borg, MD [facility] None    BP 118/78 (BP Location: Left Arm, Patient Position: Sitting, Cuff Size: Large)   Pulse 94   Resp 16   Wt 250 lb (113.4 kg)   SpO2 96%   BMI 40.35 kg/m   Weight yesterday- did not weigh Last visit weight- 255 lb  Mr Steven Chase  was seen at home this morning and reported feeling well. He denied chest pain, SOB, headache, dizziness, orthopnea, fever or cough over the past week. He stated he has been compliant with his medications over the past week and his weight has been stable. His medications were verified and his pillbox was refilled. I will follow up next week.   Jacquiline Doe, EMT 03/01/20  ACTION: Home visit completed Next visit planned for 1 weeek

## 2020-03-07 ENCOUNTER — Other Ambulatory Visit (HOSPITAL_COMMUNITY): Payer: Self-pay

## 2020-03-07 NOTE — Progress Notes (Signed)
[facility]    Patient ID: Steven Chase, male    DOB: [birth year], 34 y.o.   MRN: [id number]   Patient Care Team: Biagio Borg, MD as PCP - General (Internal Medicine) Nahser, Wonda Cheng, MD as PCP - Cardiology (Cardiology) Jorge Ny, LCSW as Social Worker (Licensed Clinical Social Worker)  Patient Active Problem List   Diagnosis Date Noted  . Elevated liver enzymes 02/06/2020  . CKD (chronic kidney disease) stage 3, GFR 30-59 ml/min 02/06/2020  . Diabetes mellitus type 2 in obese ([place]) 02/06/2020  . Vitamin D deficiency 02/06/2020  . Dyslipidemia (high LDL; low HDL) 02/06/2020  . Hypertriglyceridemia 02/06/2020  . Morbid obesity ([place]) 02/05/2020  . Acute gout of left ankle 01/04/2020  . AKI (acute kidney injury) ([place]) 11/10/2019  . Hyperkalemia 11/10/2019  . Acute exacerbation of CHF (congestive heart failure) ([place]) 07/15/2019  . Lipodystrophy 10/21/2018  . Chronic systolic heart failure ([place]) 02/17/2018  . Fatigue 02/17/2018  . Cardiomyopathy ([place]) 02/11/2018  . Hypertensive crisis 02/04/2018  . Chest pain 02/04/2018  . Allergic rhinitis 01/21/2018  . Hyperglycemia 12/02/2017  . Hypokalemia 12/02/2017  . Cough 12/02/2017  . Patellar tendonitis of left knee 12/02/2017  . Encounter for well adult exam with abnormal findings 12/10/2016  . Obesity 05/25/2016  . Lower leg edema 12/12/2014  . Essential hypertension 11/05/2014  . OSA (obstructive sleep apnea) 11/05/2014    Current Outpatient Medications:  .  allopurinol (ZYLOPRIM) 100 MG tablet, Take 1 tablet (100 mg total) by mouth 2 (two) times daily., Disp: 60 tablet, Rfl: 3 .  carvedilol (COREG) 12.5 MG tablet, TAKE 1 TABLET BY MOUTH TWICE DAILY WITH A MEAL, Disp: 180 tablet, Rfl: 2 .  fenofibrate 160 MG tablet, Take 1 tablet (160 mg total) by mouth daily., Disp: 90 tablet, Rfl: 2 .  hydrALAZINE (APRESOLINE) 100 MG tablet, Take 1 tablet (100 mg total) by mouth every 8 (eight) hours., Disp: 90 tablet,  Rfl: 11 .  isosorbide mononitrate (IMDUR) 60 MG 24 hr tablet, Take 60 mg by mouth daily., Disp: , Rfl:  .  MITIGARE 0.6 MG CAPS, Take 1 capsule by mouth daily., Disp: 30 capsule, Rfl: 5 .  omega-3 acid ethyl esters (LOVAZA) 1 g capsule, Take 2 capsules (2 g total) by mouth 2 (two) times daily., Disp: 120 capsule, Rfl: 6 .  rosuvastatin (CRESTOR) 40 MG tablet, Take 1 tablet (40 mg total) by mouth daily at 6 PM., Disp: 90 tablet, Rfl: 3 .  sacubitril-valsartan (ENTRESTO) 24-26 MG, Take 1 tablet by mouth 2 (two) times daily., Disp: 60 tablet, Rfl: 3 .  spironolactone (ALDACTONE) 25 MG tablet, Take 0.5 tablets (12.5 mg total) by mouth daily., Disp: 45 tablet, Rfl: 3 .  torsemide (DEMADEX) 20 MG tablet, Take 1 tablet (20 mg total) by mouth daily., Disp: 90 tablet, Rfl: 3 .  Vitamin D, Ergocalciferol, (DRISDOL) 1.25 MG (50000 UNIT) CAPS capsule, Take 1 capsule (50,000 Units total) by mouth every 7 (seven) days., Disp: 12 capsule, Rfl: 0 .  predniSONE (DELTASONE) 10 MG tablet, 3 tabs by mouth per day for 3 days,2tabs per day for 3 days,1tab per day for 3 days (Patient not taking: Reported on 03/01/2020), Disp: 18 tablet, Rfl: 0 No Known Allergies    Social History   Socioeconomic History  . Marital status: Single    Spouse name: Not on file  . Number of children: 1  . Years of education: 53  . Highest education level: Not on file  Occupational  History  . 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 Smoking status: Never Smoker  . Smokeless tobacco: Never Used  Substance and Sexual Activity  . Alcohol use: No  . Drug use: No  . Sexual activity: Yes    Birth control/protection: Condom  Other Topics Concern  . Not on file  Social History Narrative   Born and raised in [place], [place]. Currently live in a private residence with his mother. Fun: Play with his daughter.   Denies religious beliefs that would effect health care.    Social Determinants of Health    Financial Resource Strain: Low Risk   . Difficulty of Paying Living Expenses: Not very hard  Food Insecurity: Food Insecurity Present  . Worried About Programme researcher, broadcasting/film/video in the Last Year: Sometimes true  . Ran Out of Food in the Last Year: Sometimes true  Transportation Needs: No Transportation Needs  . Lack of Transportation (Medical): No  . Lack of Transportation (Non-Medical): No  Physical Activity:   . Days of Exercise per Week:   . Minutes of Exercise per Session:   Stress:   . Feeling of Stress :   Social Connections:   . Frequency of Communication with Friends and Family:   . Frequency of Social Gatherings with Friends and Family:   . Attends Religious Services:   . Active Member of Clubs or Organizations:   . Attends Banker Meetings:   Marland Kitchen Marital Status:   Intimate Partner Violence:   . Fear of Current or Ex-Partner:   . Emotionally Abused:   Marland Kitchen Physically Abused:   . Sexually Abused:     Physical Exam Cardiovascular:     Rate and Rhythm: Normal rate and regular rhythm.     Pulses: Normal pulses.  Pulmonary:     Effort: Pulmonary effort is normal.     Breath sounds: Normal breath sounds.  Abdominal:     General: There is distension.  Musculoskeletal:        General: Normal range of motion.     Right lower leg: Edema present.     Left lower leg: Edema present.  Skin:    General: Skin is warm and dry.     Capillary Refill: Capillary refill takes less than 2 seconds.  Neurological:     Mental Status: He is alert and oriented to person, place, and time.  Psychiatric:        Mood and Affect: Mood normal.         Future Appointments  Date Time Provider Department Center  03/11/2020  2:00 PM Bensimhon, Bevelyn Buckles, MD [facility] None  04/10/2020  3:00 PM Corwin Levins, MD [facility] None    BP 128/84 (BP Location: Left Arm, Patient Position: Sitting, Cuff Size: Normal)   Pulse 84   Resp 15   Wt 261 lb 14.4 oz (118.8 kg)   SpO2 95%   BMI 42.27  kg/m   Weight yesterday- did not weigh Last visit weight- 251 lb  Steven Chase was seen at home today and reported feeling well. He denied chest pain, SOB, headache, dizziness, orthopnea, fever or cough since our last visit. He reported being compliant with his medications over the past week but his weight has increased 10 pounds and his exhibited increased lower extremity edema and abdominal distension. I contacted the HF clinic and was instructed to have him take an additional 20 mg of torsemide tonight and be sure to  keep his appointment on Monday with Dr Gala Romney. He was understanding and agreeable. 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 I stressed the importance of a low sodium diet coupled with limiting his fluid intake and he was agreeable. I will follow up next week.   [name], EMT 03/07/20  ACTION: Home visit completed Next visit planned for 1 week

## 2020-03-11 ENCOUNTER — Encounter (HOSPITAL_COMMUNITY): Payer: Self-pay | Admitting: Internal Medicine

## 2020-03-19 ENCOUNTER — Other Ambulatory Visit (HOSPITAL_COMMUNITY): Payer: Self-pay

## 2020-03-19 NOTE — Progress Notes (Signed)
Paramedicine Encounter    Patient ID: Steven Chase, male    DOB: 09/06/1986, 34 y.o.   MRN: [id number]   Patient Care Team: Corwin Levins, MD as PCP - General (Internal Medicine) Nahser, Deloris Ping, MD as PCP - Cardiology (Cardiology) Burna Sis, LCSW as Social Worker (Licensed Clinical Social Worker)  Patient Active Problem List   Diagnosis Date Noted  . Elevated liver enzymes 02/06/2020  . CKD (chronic kidney disease) stage 3, GFR 30-59 ml/min 02/06/2020  . Diabetes mellitus type 2 in obese (HCC) 02/06/2020  . Vitamin D deficiency 02/06/2020  . Dyslipidemia (high LDL; low HDL) 02/06/2020  . Hypertriglyceridemia 02/06/2020  . Morbid obesity (HCC) 02/05/2020  . Acute gout of left ankle 01/04/2020  . AKI (acute kidney injury) (HCC) 11/10/2019  . Hyperkalemia 11/10/2019  . Acute exacerbation of CHF (congestive heart failure) (HCC) 07/15/2019  . Lipodystrophy 10/21/2018  . Chronic systolic heart failure (HCC) 02/17/2018  . Fatigue 02/17/2018  . Cardiomyopathy (HCC) 02/11/2018  . Hypertensive crisis 02/04/2018  . Chest pain 02/04/2018  . Allergic rhinitis 01/21/2018  . Hyperglycemia 12/02/2017  . Hypokalemia 12/02/2017  . Cough 12/02/2017  . Patellar tendonitis of left knee 12/02/2017  . Encounter for well adult exam with abnormal findings 12/10/2016  . Obesity 05/25/2016  . Lower leg edema 12/12/2014  . Essential hypertension 11/05/2014  . OSA (obstructive sleep apnea) 11/05/2014    Current Outpatient Medications:  .  allopurinol (ZYLOPRIM) 100 MG tablet, Take 1 tablet (100 mg total) by mouth 2 (two) times daily., Disp: 60 tablet, Rfl: 3 .  carvedilol (COREG) 12.5 MG tablet, TAKE 1 TABLET BY MOUTH TWICE DAILY WITH A MEAL, Disp: 180 tablet, Rfl: 2 .  fenofibrate 160 MG tablet, Take 1 tablet (160 mg total) by mouth daily., Disp: 90 tablet, Rfl: 2 .  hydrALAZINE (APRESOLINE) 100 MG tablet, Take 1 tablet (100 mg total) by mouth every 8 (eight) hours., Disp: 90 tablet, Rfl:  11 .  isosorbide mononitrate (IMDUR) 60 MG 24 hr tablet, Take 60 mg by mouth daily., Disp: , Rfl:  .  omega-3 acid ethyl esters (LOVAZA) 1 g capsule, Take 2 capsules (2 g total) by mouth 2 (two) times daily., Disp: 120 capsule, Rfl: 6 .  rosuvastatin (CRESTOR) 40 MG tablet, Take 1 tablet (40 mg total) by mouth daily at 6 PM., Disp: 90 tablet, Rfl: 3 .  sacubitril-valsartan (ENTRESTO) 24-26 MG, Take 1 tablet by mouth 2 (two) times daily., Disp: 60 tablet, Rfl: 3 .  spironolactone (ALDACTONE) 25 MG tablet, Take 0.5 tablets (12.5 mg total) by mouth daily., Disp: 45 tablet, Rfl: 3 .  torsemide (DEMADEX) 20 MG tablet, Take 1 tablet (20 mg total) by mouth daily., Disp: 90 tablet, Rfl: 3 .  Vitamin D, Ergocalciferol, (DRISDOL) 1.25 MG (50000 UNIT) CAPS capsule, Take 1 capsule (50,000 Units total) by mouth every 7 (seven) days., Disp: 12 capsule, Rfl: 0 .  MITIGARE 0.6 MG CAPS, Take 1 capsule by mouth daily. (Patient not taking: Reported on 03/19/2020), Disp: 30 capsule, Rfl: 5 .  predniSONE (DELTASONE) 10 MG tablet, 3 tabs by mouth per day for 3 days,2tabs per day for 3 days,1tab per day for 3 days (Patient not taking: Reported on 03/01/2020), Disp: 18 tablet, Rfl: 0 No Known Allergies    Social History   Socioeconomic History  . Marital status: Single    Spouse name: Not on file  . Number of children: 1  . Years of education: 32  . Highest education  level: Not on file  Occupational History  . Occupation: Research scientist (physical sciences): [employer]  Tobacco Use  . Smoking status: Never Smoker  . Smokeless tobacco: Never Used  Substance and Sexual Activity  . Alcohol use: No  . Drug use: No  . Sexual activity: Yes    Birth control/protection: Condom  Other Topics Concern  . Not on file  Social History Narrative   Born and raised in [place], [place]. Currently live in a private residence with his mother. Fun: Play with his daughter.   Denies religious beliefs that would effect health care.     Social Determinants of Health   Financial Resource Strain: Low Risk   . Difficulty of Paying Living Expenses: Not very hard  Food Insecurity: Food Insecurity Present  . Worried About Charity fundraiser in the Last Year: Sometimes true  . Ran Out of Food in the Last Year: Sometimes true  Transportation Needs: No Transportation Needs  . Lack of Transportation (Medical): No  . Lack of Transportation (Non-Medical): No  Physical Activity:   . Days of Exercise per Week:   . Minutes of Exercise per Session:   Stress:   . Feeling of Stress :   Social Connections:   . Frequency of Communication with Friends and Family:   . Frequency of Social Gatherings with Friends and Family:   . Attends Religious Services:   . Active Member of Clubs or Organizations:   . Attends Archivist Meetings:   Marland Kitchen Marital Status:   Intimate Partner Violence:   . Fear of Current or Ex-Partner:   . Emotionally Abused:   Marland Kitchen Physically Abused:   . Sexually Abused:     Physical Exam Cardiovascular:     Rate and Rhythm: Regular rhythm. Tachycardia present.     Pulses: Normal pulses.  Pulmonary:     Effort: Pulmonary effort is normal.     Breath sounds: Normal breath sounds.  Musculoskeletal:        General: Normal range of motion.     Right lower leg: No edema.     Left lower leg: No edema.  Skin:    General: Skin is warm and dry.     Capillary Refill: Capillary refill takes less than 2 seconds.  Neurological:     Mental Status: He is alert and oriented to person, place, and time.  Psychiatric:        Mood and Affect: Mood normal.         Future Appointments  Date Time Provider [place]  04/10/2020  3:00 PM Biagio Borg, MD [facility] None    BP (!) 104/52 (BP Location: Left Arm, Patient Position: Sitting, Cuff Size: Normal)   Pulse (!) 102   Resp 16   Wt 257 lb (116.6 kg)   SpO2 92%   BMI 41.48 kg/m   Weight yesterday- did not weigh Last visit weight- 261 lb  Steven Chase was seen at home today and reported feeling well. He denied chest pain, SOB, headache, dizziness, orthopnea, fever or cough over the past week. He stated he has been compliant with his medications over the past week and his weight has been stable. His medications were verified and his pillbox was refilled. I will follow up next week.   Jacquiline Doe, EMT 03/19/20  ACTION: Home visit completed Next visit planned for 1 week

## 2020-03-28 ENCOUNTER — Telehealth (HOSPITAL_COMMUNITY): Payer: Self-pay | Admitting: *Deleted

## 2020-03-28 ENCOUNTER — Other Ambulatory Visit (HOSPITAL_COMMUNITY): Payer: Self-pay

## 2020-03-28 MED ORDER — CARVEDILOL 25 MG PO TABS: 25.0000 mg | ORAL_TABLET | Freq: Two times a day (BID) | ORAL | 3 refills | Status: DC

## 2020-03-28 NOTE — Progress Notes (Signed)
254Paramedicine Encounter    Patient ID: Steven Chase, male    DOB: [birth year], 34 y.o.   MRN: [id number]   Patient Care Team: Corwin Levins, MD as PCP - General (Internal Medicine) Nahser, Deloris Ping, MD as PCP - Cardiology (Cardiology) Burna Sis, LCSW as Social Worker (Licensed Clinical Social Worker)  Patient Active Problem List   Diagnosis Date Noted  . Elevated liver enzymes 02/06/2020  . CKD (chronic kidney disease) stage 3, GFR 30-59 ml/min 02/06/2020  . Diabetes mellitus type 2 in obese (HCC) 02/06/2020  . Vitamin D deficiency 02/06/2020  . Dyslipidemia (high LDL; low HDL) 02/06/2020  . Hypertriglyceridemia 02/06/2020  . Morbid obesity (HCC) 02/05/2020  . Acute gout of left ankle 01/04/2020  . AKI (acute kidney injury) (HCC) 11/10/2019  . Hyperkalemia 11/10/2019  . Acute exacerbation of CHF (congestive heart failure) (HCC) 07/15/2019  . Lipodystrophy 10/21/2018  . Chronic systolic heart failure (HCC) 02/17/2018  . Fatigue 02/17/2018  . Cardiomyopathy (HCC) 02/11/2018  . Hypertensive crisis 02/04/2018  . Chest pain 02/04/2018  . Allergic rhinitis 01/21/2018  . Hyperglycemia 12/02/2017  . Hypokalemia 12/02/2017  . Cough 12/02/2017  . Patellar tendonitis of left knee 12/02/2017  . Encounter for well adult exam with abnormal findings 12/10/2016  . Obesity 05/25/2016  . Lower leg edema 12/12/2014  . Essential hypertension 11/05/2014  . OSA (obstructive sleep apnea) 11/05/2014    Current Outpatient Medications:  .  allopurinol (ZYLOPRIM) 100 MG tablet, Take 1 tablet (100 mg total) by mouth 2 (two) times daily., Disp: 60 tablet, Rfl: 3 .  carvedilol (COREG) 12.5 MG tablet, TAKE 1 TABLET BY MOUTH TWICE DAILY WITH A MEAL, Disp: 180 tablet, Rfl: 2 .  fenofibrate 160 MG tablet, Take 1 tablet (160 mg total) by mouth daily., Disp: 90 tablet, Rfl: 2 .  hydrALAZINE (APRESOLINE) 100 MG tablet, Take 1 tablet (100 mg total) by mouth every 8 (eight) hours., Disp: 90 tablet,  Rfl: 11 .  isosorbide mononitrate (IMDUR) 60 MG 24 hr tablet, Take 60 mg by mouth daily., Disp: , Rfl:  .  MITIGARE 0.6 MG CAPS, Take 1 capsule by mouth daily. (Patient not taking: Reported on 03/19/2020), Disp: 30 capsule, Rfl: 5 .  omega-3 acid ethyl esters (LOVAZA) 1 g capsule, Take 2 capsules (2 g total) by mouth 2 (two) times daily., Disp: 120 capsule, Rfl: 6 .  predniSONE (DELTASONE) 10 MG tablet, 3 tabs by mouth per day for 3 days,2tabs per day for 3 days,1tab per day for 3 days (Patient not taking: Reported on 03/01/2020), Disp: 18 tablet, Rfl: 0 .  rosuvastatin (CRESTOR) 40 MG tablet, Take 1 tablet (40 mg total) by mouth daily at 6 PM., Disp: 90 tablet, Rfl: 3 .  sacubitril-valsartan (ENTRESTO) 24-26 MG, Take 1 tablet by mouth 2 (two) times daily., Disp: 60 tablet, Rfl: 3 .  spironolactone (ALDACTONE) 25 MG tablet, Take 0.5 tablets (12.5 mg total) by mouth daily., Disp: 45 tablet, Rfl: 3 .  torsemide (DEMADEX) 20 MG tablet, Take 1 tablet (20 mg total) by mouth daily., Disp: 90 tablet, Rfl: 3 .  Vitamin D, Ergocalciferol, (DRISDOL) 1.25 MG (50000 UNIT) CAPS capsule, Take 1 capsule (50,000 Units total) by mouth every 7 (seven) days., Disp: 12 capsule, Rfl: 0 No Known Allergies    Social History   Socioeconomic History  . Marital status: Single    Spouse name: Not on file  . Number of children: 1  . Years of education: 11  . Highest education  level: Not on file  Occupational History  . 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 Smoking status: Never Smoker  . Smokeless tobacco: Never Used  Substance and Sexual Activity  . Alcohol use: No  . Drug use: No  . Sexual activity: Yes    Birth control/protection: Condom  Other Topics Concern  . Not on file  Social History Narrative   Born and raised in [place], [place]. Currently live in a private residence with his mother. Fun: Play with his daughter.   Denies religious beliefs that would effect health care.     Social Determinants of Health   Financial Resource Strain: Low Risk   . Difficulty of Paying Living Expenses: Not very hard  Food Insecurity: Food Insecurity Present  . Worried About Programme researcher, broadcasting/film/video in the Last Year: Sometimes true  . Ran Out of Food in the Last Year: Sometimes true  Transportation Needs: No Transportation Needs  . Lack of Transportation (Medical): No  . Lack of Transportation (Non-Medical): No  Physical Activity:   . Days of Exercise per Week:   . Minutes of Exercise per Session:   Stress:   . Feeling of Stress :   Social Connections:   . Frequency of Communication with Friends and Family:   . Frequency of Social Gatherings with Friends and Family:   . Attends Religious Services:   . Active Member of Clubs or Organizations:   . Attends Banker Meetings:   Marland Kitchen Marital Status:   Intimate Partner Violence:   . Fear of Current or Ex-Partner:   . Emotionally Abused:   Marland Kitchen Physically Abused:   . Sexually Abused:     Physical Exam Cardiovascular:     Rate and Rhythm: Regular rhythm. Tachycardia present.     Pulses: Normal pulses.  Pulmonary:     Effort: Pulmonary effort is normal.     Breath sounds: Normal breath sounds.  Musculoskeletal:        General: Normal range of motion.     Right lower leg: No edema.     Left lower leg: No edema.  Skin:    General: Skin is warm and dry.     Capillary Refill: Capillary refill takes less than 2 seconds.  Neurological:     Mental Status: He is alert and oriented to person, place, and time.  Psychiatric:        Mood and Affect: Mood normal.         Future Appointments  Date Time Provider Department Center  04/10/2020  3:00 PM Corwin Levins, MD [facility] None  05/16/2020  9:00 AM Hilty, Lisette Abu, MD [facility]    BP (!) 163/115 (BP Location: Left Arm, Patient Position: Sitting, Cuff Size: Normal)   Pulse 100   Resp 16   Wt 254 lb (115.2 kg)   SpO2 94%   BMI 41.00 kg/m   Weight  yesterday- 257 lb Last visit weight- 257 lb  Mr Macleod was seen at home today and reported feeling well. He denied chest pain, SOB, headache, dizziness, orthopnea, fever or cough over the past week. He stated he has been compliant with his medications over the past week and his weight has been relatively stable. He was noted to be tachycardic and hypertensive today despite having taken all his medications this morning at 10:30 and his mid-day hydralazine dose about 30 minutes prior to my arrival. I contacted the HF clinic who advised  to increase coreg to 25 mg BID and remind him to decrease sodium consumption. The change was made to his pillbox and he was agreeable to the dietary restriction. I will follow up next week.   Jacquiline Doe, EMT 03/28/20  ACTION: Home visit completed Next visit planned for 1 week

## 2020-03-28 NOTE — Telephone Encounter (Signed)
Zack with paramedicine called to report pts bp 163/115 and heart rate 100. Per [name] Simmons,PA increase carvedilol to 25mg  twice daily and have Zack check bp next week. Zack aware and agreeable with plan

## 2020-04-04 ENCOUNTER — Other Ambulatory Visit (HOSPITAL_COMMUNITY): Payer: Self-pay

## 2020-04-04 NOTE — Progress Notes (Signed)
Paramedicine Encounter    Patient ID: Steven Chase, male    DOB: 1985/12/25, 34 y.o.   MRN: [id number]   Patient Care Team: Corwin Levins, MD as PCP - General (Internal Medicine) Nahser, Deloris Ping, MD as PCP - Cardiology (Cardiology) Burna Sis, LCSW as Social Worker (Licensed Clinical Social Worker)  Patient Active Problem List   Diagnosis Date Noted  . Elevated liver enzymes 02/06/2020  . CKD (chronic kidney disease) stage 3, GFR 30-59 ml/min 02/06/2020  . Diabetes mellitus type 2 in obese (HCC) 02/06/2020  . Vitamin D deficiency 02/06/2020  . Dyslipidemia (high LDL; low HDL) 02/06/2020  . Hypertriglyceridemia 02/06/2020  . Morbid obesity (HCC) 02/05/2020  . Acute gout of left ankle 01/04/2020  . AKI (acute kidney injury) (HCC) 11/10/2019  . Hyperkalemia 11/10/2019  . Acute exacerbation of CHF (congestive heart failure) (HCC) 07/15/2019  . Lipodystrophy 10/21/2018  . Chronic systolic heart failure (HCC) 02/17/2018  . Fatigue 02/17/2018  . Cardiomyopathy (HCC) 02/11/2018  . Hypertensive crisis 02/04/2018  . Chest pain 02/04/2018  . Allergic rhinitis 01/21/2018  . Hyperglycemia 12/02/2017  . Hypokalemia 12/02/2017  . Cough 12/02/2017  . Patellar tendonitis of left knee 12/02/2017  . Encounter for well adult exam with abnormal findings 12/10/2016  . Obesity 05/25/2016  . Lower leg edema 12/12/2014  . Essential hypertension 11/05/2014  . OSA (obstructive sleep apnea) 11/05/2014    Current Outpatient Medications:  .  allopurinol (ZYLOPRIM) 100 MG tablet, Take 1 tablet (100 mg total) by mouth 2 (two) times daily., Disp: 60 tablet, Rfl: 3 .  carvedilol (COREG) 25 MG tablet, Take 1 tablet (25 mg total) by mouth 2 (two) times daily with a meal., Disp: 60 tablet, Rfl: 3 .  fenofibrate 160 MG tablet, Take 1 tablet (160 mg total) by mouth daily., Disp: 90 tablet, Rfl: 2 .  hydrALAZINE (APRESOLINE) 100 MG tablet, Take 1 tablet (100 mg total) by mouth every 8 (eight) hours.,  Disp: 90 tablet, Rfl: 11 .  isosorbide mononitrate (IMDUR) 60 MG 24 hr tablet, Take 60 mg by mouth daily., Disp: , Rfl:  .  omega-3 acid ethyl esters (LOVAZA) 1 g capsule, Take 2 capsules (2 g total) by mouth 2 (two) times daily., Disp: 120 capsule, Rfl: 6 .  rosuvastatin (CRESTOR) 40 MG tablet, Take 1 tablet (40 mg total) by mouth daily at 6 PM., Disp: 90 tablet, Rfl: 3 .  sacubitril-valsartan (ENTRESTO) 24-26 MG, Take 1 tablet by mouth 2 (two) times daily., Disp: 60 tablet, Rfl: 3 .  spironolactone (ALDACTONE) 25 MG tablet, Take 0.5 tablets (12.5 mg total) by mouth daily., Disp: 45 tablet, Rfl: 3 .  torsemide (DEMADEX) 20 MG tablet, Take 1 tablet (20 mg total) by mouth daily., Disp: 90 tablet, Rfl: 3 .  MITIGARE 0.6 MG CAPS, Take 1 capsule by mouth daily. (Patient not taking: Reported on 03/19/2020), Disp: 30 capsule, Rfl: 5 .  predniSONE (DELTASONE) 10 MG tablet, 3 tabs by mouth per day for 3 days,2tabs per day for 3 days,1tab per day for 3 days (Patient not taking: Reported on 03/01/2020), Disp: 18 tablet, Rfl: 0 .  Vitamin D, Ergocalciferol, (DRISDOL) 1.25 MG (50000 UNIT) CAPS capsule, Take 1 capsule (50,000 Units total) by mouth every 7 (seven) days. (Patient not taking: Reported on 04/04/2020), Disp: 12 capsule, Rfl: 0 No Known Allergies    Social History   Socioeconomic History  . Marital status: Single    Spouse name: Not on file  . Number of children:  1  . Years of education: 58  . Highest education level: Not on file  Occupational History  . Occupation: Research scientist (physical sciences): [employer]  Tobacco Use  . Smoking status: Never Smoker  . Smokeless tobacco: Never Used  Substance and Sexual Activity  . Alcohol use: No  . Drug use: No  . Sexual activity: Yes    Birth control/protection: Condom  Other Topics Concern  . Not on file  Social History Narrative   Born and raised in [place], [place]. Currently live in a private residence with his mother. Fun: Play with his daughter.    Denies religious beliefs that would effect health care.    Social Determinants of Health   Financial Resource Strain: Low Risk   . Difficulty of Paying Living Expenses: Not very hard  Food Insecurity: Food Insecurity Present  . Worried About Charity fundraiser in the Last Year: Sometimes true  . Ran Out of Food in the Last Year: Sometimes true  Transportation Needs: No Transportation Needs  . Lack of Transportation (Medical): No  . Lack of Transportation (Non-Medical): No  Physical Activity:   . Days of Exercise per Week:   . Minutes of Exercise per Session:   Stress:   . Feeling of Stress :   Social Connections:   . Frequency of Communication with Friends and Family:   . Frequency of Social Gatherings with Friends and Family:   . Attends Religious Services:   . Active Member of Clubs or Organizations:   . Attends Archivist Meetings:   Marland Kitchen Marital Status:   Intimate Partner Violence:   . Fear of Current or Ex-Partner:   . Emotionally Abused:   Marland Kitchen Physically Abused:   . Sexually Abused:     Physical Exam Cardiovascular:     Rate and Rhythm: Normal rate and regular rhythm.     Pulses: Normal pulses.  Pulmonary:     Effort: Pulmonary effort is normal.     Breath sounds: Normal breath sounds.  Musculoskeletal:     Right lower leg: No edema.     Left lower leg: No edema.  Skin:    General: Skin is warm and dry.     Capillary Refill: Capillary refill takes less than 2 seconds.  Neurological:     Mental Status: He is alert and oriented to person, place, and time.  Psychiatric:        Mood and Affect: Mood normal.         Future Appointments  Date Time Provider [place]  04/10/2020  3:00 PM Biagio Borg, MD [facility] None  05/16/2020  9:00 AM Hilty, Nadean Corwin, MD [facility]    BP (!) 102/57 (BP Location: Left Arm, Patient Position: Standing, Cuff Size: Normal)   Pulse 95   Resp 18   Wt 255 lb (115.7 kg)   SpO2 94%   BMI 41.16 kg/m    Weight yesterday- Last visit weight- 254 lb  Mr Rittenhouse was seen at home today and reported feeling well. He denied chest pain, SOB, headache, dizziness, orthopnea, fever or cough over the past week. He stated he has been compliant with his medications and his weight has been stable. His medications were verified and his pillbox was refilled. I will follow up next week.   Jacquiline Doe, EMT 04/04/20  ACTION: Home visit completed Next visit planned for 1 week

## 2020-04-10 ENCOUNTER — Ambulatory Visit: Payer: Self-pay | Admitting: Internal Medicine

## 2020-04-10 DIAGNOSIS — Z0289 Encounter for other administrative examinations: Secondary | ICD-10-CM

## 2020-04-11 ENCOUNTER — Other Ambulatory Visit (HOSPITAL_COMMUNITY): Payer: Self-pay

## 2020-04-11 ENCOUNTER — Telehealth (HOSPITAL_COMMUNITY): Payer: Self-pay

## 2020-04-11 DIAGNOSIS — I5022 Chronic systolic (congestive) heart failure: Secondary | ICD-10-CM

## 2020-04-11 NOTE — Telephone Encounter (Signed)
Steven Chase with paramed called to inform Steven Chase that he was out currently at the patients home and [name] blood pressure was 55/29. However Steven Chase denies dizziness,chest pain,fatigue etc. Steven Chase took his am meds around 9am. Reviewed patients last ov note with Steven Simmons,PA and she stated that the patient should hold his afternoon dose of Hydralazine,increase po fuild intake and patient should also eat some chips or chicken noodle soup. Steven Chase also requested that Steven Chase go back to see patient his afternoon to see if those small adjustments helps. She will then determine if other steps should be taking depending on what his blood pressure goes up to.

## 2020-04-11 NOTE — Progress Notes (Signed)
Steven Chase was seen at home for the second time today and reported feeling well. He stated he had eaten a salty snack and drank some Gatorade. Upon checking his vitals I noted that his radial pulses were strong and his blood pressure had come up significantly however he was still hypotensive. I contacted the clinic and was advised to hold all blood pressure and diuretics tonight and have him report to the HF clinic first thing tomorrow morning for a BP check. I filled his pillbox as directed and will follow up tomorrow.   [name], EMT 04/11/20

## 2020-04-11 NOTE — Progress Notes (Signed)
Paramedicine Encounter    Patient ID: Steven Chase, male    DOB: [birth year], 34 y.o.   MRN: [id number]   Patient Care Team: Steven Levins, MD as PCP - General (Internal Medicine) Chase, Steven Ping, MD as PCP - Cardiology (Cardiology) Steven Sis, LCSW as Social Worker (Licensed Clinical Social Worker)  Patient Active Problem List   Diagnosis Date Noted  . Elevated liver enzymes 02/06/2020  . CKD (chronic kidney disease) stage 3, GFR 30-59 ml/min 02/06/2020  . Diabetes mellitus type 2 in obese (HCC) 02/06/2020  . Vitamin D deficiency 02/06/2020  . Dyslipidemia (high LDL; low HDL) 02/06/2020  . Hypertriglyceridemia 02/06/2020  . Morbid obesity (HCC) 02/05/2020  . Acute gout of left ankle 01/04/2020  . AKI (acute kidney injury) (HCC) 11/10/2019  . Hyperkalemia 11/10/2019  . Acute exacerbation of CHF (congestive heart failure) (HCC) 07/15/2019  . Lipodystrophy 10/21/2018  . Chronic systolic heart failure (HCC) 02/17/2018  . Fatigue 02/17/2018  . Cardiomyopathy (HCC) 02/11/2018  . Hypertensive crisis 02/04/2018  . Chest pain 02/04/2018  . Allergic rhinitis 01/21/2018  . Hyperglycemia 12/02/2017  . Hypokalemia 12/02/2017  . Cough 12/02/2017  . Patellar tendonitis of left knee 12/02/2017  . Encounter for well adult exam with abnormal findings 12/10/2016  . Obesity 05/25/2016  . Lower leg edema 12/12/2014  . Essential hypertension 11/05/2014  . OSA (obstructive sleep apnea) 11/05/2014    Current Outpatient Medications:  .  allopurinol (ZYLOPRIM) 100 MG tablet, Take 1 tablet (100 mg total) by mouth 2 (two) times daily., Disp: 60 tablet, Rfl: 3 .  carvedilol (COREG) 25 MG tablet, Take 1 tablet (25 mg total) by mouth 2 (two) times daily with a meal., Disp: 60 tablet, Rfl: 3 .  fenofibrate 160 MG tablet, Take 1 tablet (160 mg total) by mouth daily., Disp: 90 tablet, Rfl: 2 .  hydrALAZINE (APRESOLINE) 100 MG tablet, Take 1 tablet (100 mg total) by mouth every 8 (eight) hours.,  Disp: 90 tablet, Rfl: 11 .  isosorbide mononitrate (IMDUR) 60 MG 24 hr tablet, Take 60 mg by mouth daily., Disp: , Rfl:  .  MITIGARE 0.6 MG CAPS, Take 1 capsule by mouth daily. (Patient not taking: Reported on 03/19/2020), Disp: 30 capsule, Rfl: 5 .  omega-3 acid ethyl esters (LOVAZA) 1 g capsule, Take 2 capsules (2 g total) by mouth 2 (two) times daily., Disp: 120 capsule, Rfl: 6 .  predniSONE (DELTASONE) 10 MG tablet, 3 tabs by mouth per day for 3 days,2tabs per day for 3 days,1tab per day for 3 days (Patient not taking: Reported on 03/01/2020), Disp: 18 tablet, Rfl: 0 .  rosuvastatin (CRESTOR) 40 MG tablet, Take 1 tablet (40 mg total) by mouth daily at 6 PM., Disp: 90 tablet, Rfl: 3 .  sacubitril-valsartan (ENTRESTO) 24-26 MG, Take 1 tablet by mouth 2 (two) times daily., Disp: 60 tablet, Rfl: 3 .  spironolactone (ALDACTONE) 25 MG tablet, Take 0.5 tablets (12.5 mg total) by mouth daily., Disp: 45 tablet, Rfl: 3 .  torsemide (DEMADEX) 20 MG tablet, Take 1 tablet (20 mg total) by mouth daily., Disp: 90 tablet, Rfl: 3 .  Vitamin D, Ergocalciferol, (DRISDOL) 1.25 MG (50000 UNIT) CAPS capsule, Take 1 capsule (50,000 Units total) by mouth every 7 (seven) days. (Patient not taking: Reported on 04/04/2020), Disp: 12 capsule, Rfl: 0 No Known Allergies    Social History   Socioeconomic History  . Marital status: Single    Spouse name: Not on file  . Number of children:  1  . Years of education: 80  . Highest education level: Not on file  Occupational History  . Occupation: Research scientist (physical sciences): [employer]  Tobacco Use  . Smoking status: Never Smoker  . Smokeless tobacco: Never Used  Vaping Use  . Vaping Use: Never used  Substance and Sexual Activity  . Alcohol use: No  . Drug use: No  . Sexual activity: Yes    Birth control/protection: Condom  Other Topics Concern  . Not on file  Social History Narrative   Born and raised in [place], [place]. Currently live in a private residence with  his mother. Fun: Play with his daughter.   Denies religious beliefs that would effect health care.    Social Determinants of Health   Financial Resource Strain: Low Risk   . Difficulty of Paying Living Expenses: Not very hard  Food Insecurity: Food Insecurity Present  . Worried About Charity fundraiser in the Last Year: Sometimes true  . Ran Out of Food in the Last Year: Sometimes true  Transportation Needs: No Transportation Needs  . Lack of Transportation (Medical): No  . Lack of Transportation (Non-Medical): No  Physical Activity:   . Days of Exercise per Week:   . Minutes of Exercise per Session:   Stress:   . Feeling of Stress :   Social Connections:   . Frequency of Communication with Friends and Family:   . Frequency of Social Gatherings with Friends and Family:   . Attends Religious Services:   . Active Member of Clubs or Organizations:   . Attends Archivist Meetings:   Marland Kitchen Marital Status:   Intimate Partner Violence:   . Fear of Current or Ex-Partner:   . Emotionally Abused:   Marland Kitchen Physically Abused:   . Sexually Abused:     Physical Exam Cardiovascular:     Rate and Rhythm: Normal rate and regular rhythm.     Pulses: Normal pulses.  Pulmonary:     Effort: Pulmonary effort is normal.     Breath sounds: Normal breath sounds.  Musculoskeletal:        General: Normal range of motion.     Right lower leg: No edema.     Left lower leg: No edema.  Skin:    General: Skin is warm and dry.     Capillary Refill: Capillary refill takes less than 2 seconds.  Neurological:     Mental Status: He is alert and oriented to person, place, and time.  Psychiatric:        Mood and Affect: Mood normal.         Future Appointments  Date Time Provider [place]  05/16/2020  9:00 AM Hilty, Nadean Corwin, MD [facility]    BP (!) 55/29 (BP Location: Left Arm, Patient Position: Sitting, Cuff Size: Normal)   Pulse 96   Resp 16   Wt 263 lb (119.3 kg)    SpO2 93%   BMI 42.45 kg/m   Weight yesterday- 259 lb Last visit weight- 255 lb  [name] was seen at home today and reported feeling well. He denied chest pain, SOB, headache, dizziness, orthopnea, fever or cough since our last visit. He stated he has been compliant with his medications over the past week but his weight has trended up 8 lb. Upon checking his vital signs I noted he was very hypotensive. I contacted the HF clinic and was advised to his afternoon hydralazine and then recheck his  blood pressure at 14:30. I did not fill his pillbox at this time in anticipation that something would change after the afternoon visit and I advised his to increase his fluid intake and eat a salty snack. He was understanding and agreeable.   [name], EMT 04/11/20  ACTION: Home visit completed Next visit planned for this afternoon

## 2020-04-11 NOTE — Telephone Encounter (Signed)
Steven Chase with paramedicine called to give an update on Steven Chase BP. It was 84/46. Discussed this with Johnette Abraham and she stated that the patient should hold all diuretics and BP meds tonight and tomorrow morning. He will also need to come in for labs to have a bmet checked and to recheck his bp. Patient was agreeable with plan and appt was scheduled.   Orders Placed This Encounter  Procedures  . Basic Metabolic Panel (BMET)    Standing Status:   Future    Standing Expiration Date:   04/11/2021    Order Specific Question:   Release to patient    Answer:   Immediate

## 2020-04-12 ENCOUNTER — Other Ambulatory Visit: Payer: Self-pay

## 2020-04-12 ENCOUNTER — Other Ambulatory Visit (HOSPITAL_COMMUNITY): Payer: Self-pay

## 2020-04-12 ENCOUNTER — Ambulatory Visit (HOSPITAL_COMMUNITY)
Admission: RE | Admit: 2020-04-12 | Discharge: 2020-04-12 | Disposition: A | Discharge status: Home / Self Care | Payer: 59 | Source: Ambulatory Visit | Attending: Internal Medicine | Admitting: Internal Medicine

## 2020-04-12 DIAGNOSIS — I5022 Chronic systolic (congestive) heart failure: Secondary | ICD-10-CM | POA: Insufficient documentation

## 2020-04-12 LAB — BASIC METABOLIC PANEL
Anion gap: 11 (ref 5–15)
BUN: 23 mg/dL — ABNORMAL HIGH (ref 6–20)
CO2: 30 mmol/L (ref 22–32)
Calcium: 10 mg/dL (ref 8.9–10.3)
Chloride: 101 mmol/L (ref 98–111)
Creatinine, Ser: 2.27 mg/dL — ABNORMAL HIGH (ref 0.61–1.24)
GFR calc Af Amer: 42 mL/min — ABNORMAL LOW (ref >60)
GFR calc non Af Amer: 36 mL/min — ABNORMAL LOW (ref >60)
Glucose, Bld: 112 mg/dL — ABNORMAL HIGH (ref 70–99)
Potassium: 3.1 mmol/L — ABNORMAL LOW (ref 3.5–5.1)
Sodium: 142 mmol/L (ref 135–145)

## 2020-04-12 MED ORDER — SACUBITRIL-VALSARTAN 24-26 MG PO TABS: 1.0000 | ORAL_TABLET | Freq: Two times a day (BID) | ORAL | 3 refills | Status: AC

## 2020-04-12 NOTE — Addendum Note (Signed)
Encounter addended by: Chinita Pester, CMA on: 04/12/2020 8:53 AM  Actions taken: Clinical Note Signed

## 2020-04-12 NOTE — Progress Notes (Signed)
Patient was seen in office for a nurse visit. Vitals,ekg was reviewed with [name] Simmons,PA. She stated that the patient could take his Carvedilol,Imdur and Hydralazine but to hold off on all diuretics for now until we get labs results back. Patient was agreeable with plan.

## 2020-04-12 NOTE — Progress Notes (Signed)
Steven Chase was seen at home to amend his pillbox in accordance with orders from [facility], [place]. All blood pressure medications were held yesterday due to hypotension but upon check this morning his pressure was high again. Per [name], all blood pressure medications were restarted. I will follow up next week.   [name], EMT 04/12/20

## 2020-04-15 ENCOUNTER — Encounter (HOSPITAL_COMMUNITY): Payer: 59

## 2020-04-18 ENCOUNTER — Telehealth (HOSPITAL_COMMUNITY): Payer: Self-pay

## 2020-04-18 NOTE — Telephone Encounter (Signed)
Called Mr Lippmann to see if he was available for a visit. He stated he was not home and would not be there until it was time for him to go to work. He asked that I come tomorrow instead so we agreed to meet at 14:30.   [name], EMT 04/18/20

## 2020-04-19 ENCOUNTER — Other Ambulatory Visit (HOSPITAL_COMMUNITY): Payer: Self-pay

## 2020-04-19 NOTE — Progress Notes (Signed)
Paramedicine Encounter    Patient ID: Steven Chase, male    DOB: [birth year], 34 y.o.   MRN: [id number]   Patient Care Team: Steven Borg, MD as PCP - General (Internal Medicine) Nahser, Wonda Cheng, MD as PCP - Cardiology (Cardiology) Steven Ny, LCSW as Social Worker (Licensed Clinical Social Worker)  Patient Active Problem List   Diagnosis Date Noted   Elevated liver enzymes 02/06/2020   CKD (chronic kidney disease) stage 3, GFR 30-59 ml/min 02/06/2020   Diabetes mellitus type 2 in obese ([place]) 02/06/2020   Vitamin D deficiency 02/06/2020   Dyslipidemia (high LDL; low HDL) 02/06/2020   Hypertriglyceridemia 02/06/2020   Morbid obesity ([place]) 02/05/2020   Acute gout of left ankle 01/04/2020   AKI (acute kidney injury) ([place]) 11/10/2019   Hyperkalemia 11/10/2019   Acute exacerbation of CHF (congestive heart failure) ([place]) 07/15/2019   Lipodystrophy 08/65/7846   Chronic systolic heart failure ([place]) 02/17/2018   Fatigue 02/17/2018   Cardiomyopathy ([place]) 02/11/2018   Hypertensive crisis 02/04/2018   Chest pain 02/04/2018   Allergic rhinitis 01/21/2018   Hyperglycemia 12/02/2017   Hypokalemia 12/02/2017   Cough 12/02/2017   Patellar tendonitis of left knee 12/02/2017   Encounter for well adult exam with abnormal findings 12/10/2016   Obesity 05/25/2016   Lower leg edema 12/12/2014   Essential hypertension 11/05/2014   OSA (obstructive sleep apnea) 11/05/2014    Current Outpatient Medications:    allopurinol (ZYLOPRIM) 100 MG tablet, Take 1 tablet (100 mg total) by mouth 2 (two) times daily., Disp: 60 tablet, Rfl: 3   carvedilol (COREG) 25 MG tablet, Take 1 tablet (25 mg total) by mouth 2 (two) times daily with a meal., Disp: 60 tablet, Rfl: 3   fenofibrate 160 MG tablet, Take 1 tablet (160 mg total) by mouth daily., Disp: 90 tablet, Rfl: 2   hydrALAZINE (APRESOLINE) 100 MG tablet, Take 1 tablet (100 mg total) by mouth every 8 (eight) hours.,  Disp: 90 tablet, Rfl: 11   isosorbide mononitrate (IMDUR) 60 MG 24 hr tablet, Take 60 mg by mouth daily., Disp: , Rfl:    omega-3 acid ethyl esters (LOVAZA) 1 g capsule, Take 2 capsules (2 g total) by mouth 2 (two) times daily., Disp: 120 capsule, Rfl: 6   rosuvastatin (CRESTOR) 40 MG tablet, Take 1 tablet (40 mg total) by mouth daily at 6 PM., Disp: 90 tablet, Rfl: 3   sacubitril-valsartan (ENTRESTO) 24-26 MG, Take 1 tablet by mouth 2 (two) times daily., Disp: 180 tablet, Rfl: 3   spironolactone (ALDACTONE) 25 MG tablet, Take 0.5 tablets (12.5 mg total) by mouth daily., Disp: 45 tablet, Rfl: 3   torsemide (DEMADEX) 20 MG tablet, Take 1 tablet (20 mg total) by mouth daily., Disp: 90 tablet, Rfl: 3   MITIGARE 0.6 MG CAPS, Take 1 capsule by mouth daily. (Patient not taking: Reported on 03/19/2020), Disp: 30 capsule, Rfl: 5   predniSONE (DELTASONE) 10 MG tablet, 3 tabs by mouth per day for 3 days,2tabs per day for 3 days,1tab per day for 3 days (Patient not taking: Reported on 03/01/2020), Disp: 18 tablet, Rfl: 0   Vitamin D, Ergocalciferol, (DRISDOL) 1.25 MG (50000 UNIT) CAPS capsule, Take 1 capsule (50,000 Units total) by mouth every 7 (seven) days. (Patient not taking: Reported on 04/04/2020), Disp: 12 capsule, Rfl: 0 No Known Allergies    Social History   Socioeconomic History   Marital status: Single    Spouse name: Not on file   Number of children:  1   Years of education: 85   Highest education level: Not on file  Occupational History   Occupation: Unloader    Employer: [employer]  Tobacco Use   Smoking status: Never Smoker   Smokeless tobacco: Never Used  Vaping Use   Vaping Use: Never used  Substance and Sexual Activity   Alcohol use: No   Drug use: No   Sexual activity: Yes    Birth control/protection: Condom  Other Topics Concern   Not on file  Social History Narrative   Born and raised in [place], [place]. Currently live in a private residence with  his mother. Fun: Play with his daughter.   Denies religious beliefs that would effect health care.    Social Determinants of Health   Financial Resource Strain: Low Risk    Difficulty of Paying Living Expenses: Not very hard  Food Insecurity: Food Insecurity Present   Worried About Running Out of Food in the Last Year: Sometimes true   Ran Out of Food in the Last Year: Sometimes true  Transportation Needs: No Transportation Needs   Lack of Transportation (Medical): No   Lack of Transportation (Non-Medical): No  Physical Activity:    Days of Exercise per Week:    Minutes of Exercise per Session:   Stress:    Feeling of Stress :   Social Connections:    Frequency of Communication with Friends and Family:    Frequency of Social Gatherings with Friends and Family:    Attends Religious Services:    Active Member of Clubs or Organizations:    Attends Banker Meetings:    Marital Status:   Intimate Partner Violence:    Fear of Current or Ex-Partner:    Emotionally Abused:    Physically Abused:    Sexually Abused:     Physical Exam Cardiovascular:     Rate and Rhythm: Normal rate and regular rhythm.     Pulses: Normal pulses.  Pulmonary:     Effort: Pulmonary effort is normal.     Breath sounds: Normal breath sounds.  Musculoskeletal:        General: Normal range of motion.     Right lower leg: Edema present.     Left lower leg: Edema present.  Skin:    General: Skin is warm and dry.     Capillary Refill: Capillary refill takes less than 2 seconds.  Neurological:     Mental Status: He is alert and oriented to person, place, and time.  Psychiatric:        Mood and Affect: Mood normal.         Future Appointments  Date Time Provider Department Center  05/16/2020  9:00 AM Hilty, Lisette Abu, MD [facility]    BP (!) 142/92 (BP Location: Left Arm, Patient Position: Sitting, Cuff Size: Large)    Pulse 89    Resp 16    Wt 256 lb  (116.1 kg)    SpO2 99%    BMI 41.32 kg/m   Weight yesterday- 259 lb Last visit weight- 257 lb  Mr Steven Chase was seen at home today and reported feeling well. He denied chest pain, SOB, headache, dizziness, orthopnea, fever or cough over the past week. He stated he has been compliant with his medications over the past week and his weight has been stable. He was noted to be slightly hypertensive and had BLEE but stated his fluid intake has increased due to working in a [employer].  I urged him to be cognizant of his fluid intake and he was agreeable. His medications were verified and his pillbox was refilled. He needed refills to finish his box through the next week so those were ordered and I will follow up Tuesday afternoon.   [name], EMT 04/19/20  ACTION: Home visit completed Next visit planned for Tuesday

## 2020-04-25 ENCOUNTER — Telehealth (HOSPITAL_COMMUNITY): Payer: Self-pay

## 2020-04-25 NOTE — Telephone Encounter (Signed)
I called Steven Chase to schedule an appointment. He did not answer so I left a message requesting he call me back.   [name], EMT 04/25/20

## 2020-04-25 NOTE — Telephone Encounter (Signed)
I called Mr Glove to schedule an appointment. He did not answer so I left a message requesting he call me back.  ° °Jaydenn Boccio R Jaziya Obarr, EMT °04/25/20 ° °

## 2020-04-26 ENCOUNTER — Other Ambulatory Visit (HOSPITAL_COMMUNITY): Payer: Self-pay

## 2020-04-26 NOTE — Progress Notes (Signed)
Paramedicine Encounter    Patient ID: Steven Chase, male    DOB: 03-Jun-1986, 34 y.o.   MRN: [id number]   Patient Care Team: Corwin Levins, MD as PCP - General (Internal Medicine) Nahser, Deloris Ping, MD as PCP - Cardiology (Cardiology) Burna Sis, LCSW as Social Worker (Licensed Clinical Social Worker)  Patient Active Problem List   Diagnosis Date Noted  . Elevated liver enzymes 02/06/2020  . CKD (chronic kidney disease) stage 3, GFR 30-59 ml/min 02/06/2020  . Diabetes mellitus type 2 in obese (HCC) 02/06/2020  . Vitamin D deficiency 02/06/2020  . Dyslipidemia (high LDL; low HDL) 02/06/2020  . Hypertriglyceridemia 02/06/2020  . Morbid obesity (HCC) 02/05/2020  . Acute gout of left ankle 01/04/2020  . AKI (acute kidney injury) (HCC) 11/10/2019  . Hyperkalemia 11/10/2019  . Acute exacerbation of CHF (congestive heart failure) (HCC) 07/15/2019  . Lipodystrophy 10/21/2018  . Chronic systolic heart failure (HCC) 02/17/2018  . Fatigue 02/17/2018  . Cardiomyopathy (HCC) 02/11/2018  . Hypertensive crisis 02/04/2018  . Chest pain 02/04/2018  . Allergic rhinitis 01/21/2018  . Hyperglycemia 12/02/2017  . Hypokalemia 12/02/2017  . Cough 12/02/2017  . Patellar tendonitis of left knee 12/02/2017  . Encounter for well adult exam with abnormal findings 12/10/2016  . Obesity 05/25/2016  . Lower leg edema 12/12/2014  . Essential hypertension 11/05/2014  . OSA (obstructive sleep apnea) 11/05/2014    Current Outpatient Medications:  .  allopurinol (ZYLOPRIM) 100 MG tablet, Take 1 tablet (100 mg total) by mouth 2 (two) times daily., Disp: 60 tablet, Rfl: 3 .  carvedilol (COREG) 25 MG tablet, Take 1 tablet (25 mg total) by mouth 2 (two) times daily with a meal., Disp: 60 tablet, Rfl: 3 .  fenofibrate 160 MG tablet, Take 1 tablet (160 mg total) by mouth daily., Disp: 90 tablet, Rfl: 2 .  hydrALAZINE (APRESOLINE) 100 MG tablet, Take 1 tablet (100 mg total) by mouth every 8 (eight) hours.,  Disp: 90 tablet, Rfl: 11 .  isosorbide mononitrate (IMDUR) 60 MG 24 hr tablet, Take 60 mg by mouth daily., Disp: , Rfl:  .  omega-3 acid ethyl esters (LOVAZA) 1 g capsule, Take 2 capsules (2 g total) by mouth 2 (two) times daily., Disp: 120 capsule, Rfl: 6 .  rosuvastatin (CRESTOR) 40 MG tablet, Take 1 tablet (40 mg total) by mouth daily at 6 PM., Disp: 90 tablet, Rfl: 3 .  sacubitril-valsartan (ENTRESTO) 24-26 MG, Take 1 tablet by mouth 2 (two) times daily., Disp: 180 tablet, Rfl: 3 .  torsemide (DEMADEX) 20 MG tablet, Take 1 tablet (20 mg total) by mouth daily., Disp: 90 tablet, Rfl: 3 .  MITIGARE 0.6 MG CAPS, Take 1 capsule by mouth daily. (Patient not taking: Reported on 03/19/2020), Disp: 30 capsule, Rfl: 5 .  predniSONE (DELTASONE) 10 MG tablet, 3 tabs by mouth per day for 3 days,2tabs per day for 3 days,1tab per day for 3 days (Patient not taking: Reported on 03/01/2020), Disp: 18 tablet, Rfl: 0 .  spironolactone (ALDACTONE) 25 MG tablet, Take 0.5 tablets (12.5 mg total) by mouth daily. (Patient not taking: Reported on 04/26/2020), Disp: 45 tablet, Rfl: 3 .  Vitamin D, Ergocalciferol, (DRISDOL) 1.25 MG (50000 UNIT) CAPS capsule, Take 1 capsule (50,000 Units total) by mouth every 7 (seven) days. (Patient not taking: Reported on 04/26/2020), Disp: 12 capsule, Rfl: 0 No Known Allergies    Social History   Socioeconomic History  . Marital status: Single    Spouse name: Not on  file  . Number of children: 1  . Years of education: 33  . Highest education level: Not on file  Occupational History  . 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 Smoking status: Never Smoker  . Smokeless tobacco: Never Used  Vaping Use  . Vaping Use: Never used  Substance and Sexual Activity  . Alcohol use: No  . Drug use: No  . Sexual activity: Yes    Birth control/protection: Condom  Other Topics Concern  . Not on file  Social History Narrative   Born and raised in [place],  [place]. Currently live in a private residence with his mother. Fun: Play with his daughter.   Denies religious beliefs that would effect health care.    Social Determinants of Health   Financial Resource Strain: Low Risk   . Difficulty of Paying Living Expenses: Not very hard  Food Insecurity: Food Insecurity Present  . Worried About Programme researcher, broadcasting/film/video in the Last Year: Sometimes true  . Ran Out of Food in the Last Year: Sometimes true  Transportation Needs: No Transportation Needs  . Lack of Transportation (Medical): No  . Lack of Transportation (Non-Medical): No  Physical Activity:   . Days of Exercise per Week:   . Minutes of Exercise per Session:   Stress:   . Feeling of Stress :   Social Connections:   . Frequency of Communication with Friends and Family:   . Frequency of Social Gatherings with Friends and Family:   . Attends Religious Services:   . Active Member of Clubs or Organizations:   . Attends Banker Meetings:   Marland Kitchen Marital Status:   Intimate Partner Violence:   . Fear of Current or Ex-Partner:   . Emotionally Abused:   Marland Kitchen Physically Abused:   . Sexually Abused:     Physical Exam Cardiovascular:     Rate and Rhythm: Normal rate and regular rhythm.     Pulses: Normal pulses.  Pulmonary:     Effort: Pulmonary effort is normal.     Breath sounds: Normal breath sounds.  Musculoskeletal:        General: Normal range of motion.     Right lower leg: Edema present.     Left lower leg: Edema present.  Skin:    General: Skin is warm and dry.     Capillary Refill: Capillary refill takes less than 2 seconds.  Neurological:     Mental Status: He is alert and oriented to person, place, and time.  Psychiatric:        Mood and Affect: Mood normal.         Future Appointments  Date Time Provider Department Center  05/16/2020  9:00 AM Hilty, Lisette Abu, MD [facility]    BP 140/90 (BP Location: Left Arm, Patient Position: Sitting, Cuff Size:  Normal)   Pulse 87   Resp 16   Wt 254 lb (115.2 kg)   SpO2 95%   BMI 41.00 kg/m   Weight yesterday- did not weigh Last visit weight- 256 lb  Steven Chase was seen at home today and reported feeling well. He denied chest pain, SOB, headache, dizziness, orthopnea, fever or cough over the past week. He stated he has been compliant with his medications and his weight has been stable. He had not taken his afternoon hydralazine yet and was borderline hypertensive. His medications were verified and his pillbox was refilled. I will follow up next week.  Jacquiline Doe, EMT 04/26/20  ACTION: Home visit completed Next visit planned for 1 week

## 2020-05-02 ENCOUNTER — Telehealth (HOSPITAL_COMMUNITY): Payer: Self-pay

## 2020-05-02 NOTE — Telephone Encounter (Signed)
I called Mr Steven Chase to schedule an appointment. He did not answer the phone so I left a message requesting he call me back.   [name], EMT 05/02/20

## 2020-05-03 ENCOUNTER — Other Ambulatory Visit (HOSPITAL_COMMUNITY): Payer: Self-pay

## 2020-05-03 NOTE — Progress Notes (Signed)
Paramedicine Encounter    Patient ID: Steven Chase, male    DOB: February 10, 1986, 34 y.o.   MRN: [id number]   Patient Care Team: Steven Levins, MD as PCP - General (Internal Medicine) Chase, Steven Ping, MD as PCP - Cardiology (Cardiology) Steven Sis, LCSW as Social Worker (Licensed Clinical Social Worker)  Patient Active Problem List   Diagnosis Date Noted  . Elevated liver enzymes 02/06/2020  . CKD (chronic kidney disease) stage 3, GFR 30-59 ml/min 02/06/2020  . Diabetes mellitus type 2 in obese (HCC) 02/06/2020  . Vitamin D deficiency 02/06/2020  . Dyslipidemia (high LDL; low HDL) 02/06/2020  . Hypertriglyceridemia 02/06/2020  . Morbid obesity (HCC) 02/05/2020  . Acute gout of left ankle 01/04/2020  . AKI (acute kidney injury) (HCC) 11/10/2019  . Hyperkalemia 11/10/2019  . Acute exacerbation of CHF (congestive heart failure) (HCC) 07/15/2019  . Lipodystrophy 10/21/2018  . Chronic systolic heart failure (HCC) 02/17/2018  . Fatigue 02/17/2018  . Cardiomyopathy (HCC) 02/11/2018  . Hypertensive crisis 02/04/2018  . Chest pain 02/04/2018  . Allergic rhinitis 01/21/2018  . Hyperglycemia 12/02/2017  . Hypokalemia 12/02/2017  . Cough 12/02/2017  . Patellar tendonitis of left knee 12/02/2017  . Encounter for well adult exam with abnormal findings 12/10/2016  . Obesity 05/25/2016  . Lower leg edema 12/12/2014  . Essential hypertension 11/05/2014  . OSA (obstructive sleep apnea) 11/05/2014    Current Outpatient Medications:  .  allopurinol (ZYLOPRIM) 100 MG tablet, Take 1 tablet (100 mg total) by mouth 2 (two) times daily., Disp: 60 tablet, Rfl: 3 .  carvedilol (COREG) 25 MG tablet, Take 1 tablet (25 mg total) by mouth 2 (two) times daily with a meal., Disp: 60 tablet, Rfl: 3 .  fenofibrate 160 MG tablet, Take 1 tablet (160 mg total) by mouth daily., Disp: 90 tablet, Rfl: 2 .  hydrALAZINE (APRESOLINE) 100 MG tablet, Take 1 tablet (100 mg total) by mouth every 8 (eight) hours.,  Disp: 90 tablet, Rfl: 11 .  isosorbide mononitrate (IMDUR) 60 MG 24 hr tablet, Take 60 mg by mouth daily., Disp: , Rfl:  .  omega-3 acid ethyl esters (LOVAZA) 1 g capsule, Take 2 capsules (2 g total) by mouth 2 (two) times daily., Disp: 120 capsule, Rfl: 6 .  rosuvastatin (CRESTOR) 40 MG tablet, Take 1 tablet (40 mg total) by mouth daily at 6 PM., Disp: 90 tablet, Rfl: 3 .  sacubitril-valsartan (ENTRESTO) 24-26 MG, Take 1 tablet by mouth 2 (two) times daily., Disp: 180 tablet, Rfl: 3 .  spironolactone (ALDACTONE) 25 MG tablet, Take 0.5 tablets (12.5 mg total) by mouth daily., Disp: 45 tablet, Rfl: 3 .  torsemide (DEMADEX) 20 MG tablet, Take 1 tablet (20 mg total) by mouth daily., Disp: 90 tablet, Rfl: 3 .  MITIGARE 0.6 MG CAPS, Take 1 capsule by mouth daily. (Patient not taking: Reported on 03/19/2020), Disp: 30 capsule, Rfl: 5 .  predniSONE (DELTASONE) 10 MG tablet, 3 tabs by mouth per day for 3 days,2tabs per day for 3 days,1tab per day for 3 days (Patient not taking: Reported on 03/01/2020), Disp: 18 tablet, Rfl: 0 .  Vitamin D, Ergocalciferol, (DRISDOL) 1.25 MG (50000 UNIT) CAPS capsule, Take 1 capsule (50,000 Units total) by mouth every 7 (seven) days. (Patient not taking: Reported on 04/26/2020), Disp: 12 capsule, Rfl: 0 No Known Allergies    Social History   Socioeconomic History  . Marital status: Single    Spouse name: Not on file  . Number of children:  1  . Years of education: 25  . Highest education level: Not on file  Occupational History  . 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 Smoking status: Never Smoker  . Smokeless tobacco: Never Used  Vaping Use  . Vaping Use: Never used  Substance and Sexual Activity  . Alcohol use: No  . Drug use: No  . Sexual activity: Yes    Birth control/protection: Condom  Other Topics Concern  . Not on file  Social History Narrative   Born and raised in [place], [place]. Currently live in a private residence  with his mother. Fun: Play with his daughter.   Denies religious beliefs that would effect health care.    Social Determinants of Health   Financial Resource Strain: Low Risk   . Difficulty of Paying Living Expenses: Not very hard  Food Insecurity: Food Insecurity Present  . Worried About Programme researcher, broadcasting/film/video in the Last Year: Sometimes true  . Ran Out of Food in the Last Year: Sometimes true  Transportation Needs: No Transportation Needs  . Lack of Transportation (Medical): No  . Lack of Transportation (Non-Medical): No  Physical Activity:   . Days of Exercise per Week:   . Minutes of Exercise per Session:   Stress:   . Feeling of Stress :   Social Connections:   . Frequency of Communication with Friends and Family:   . Frequency of Social Gatherings with Friends and Family:   . Attends Religious Services:   . Active Member of Clubs or Organizations:   . Attends Banker Meetings:   Marland Kitchen Marital Status:   Intimate Partner Violence:   . Fear of Current or Ex-Partner:   . Emotionally Abused:   Marland Kitchen Physically Abused:   . Sexually Abused:     Physical Exam Cardiovascular:     Rate and Rhythm: Regular rhythm. Tachycardia present.     Pulses: Normal pulses.  Pulmonary:     Effort: Pulmonary effort is normal.     Breath sounds: Normal breath sounds.  Musculoskeletal:        General: Normal range of motion.     Right lower leg: Edema present.     Left lower leg: Edema present.  Skin:    General: Skin is warm and dry.     Capillary Refill: Capillary refill takes less than 2 seconds.  Neurological:     Mental Status: He is alert and oriented to person, place, and time.  Psychiatric:        Mood and Affect: Mood normal.         Future Appointments  Date Time Provider Department Center  05/16/2020  9:00 AM Hilty, Lisette Abu, MD [facility]    BP (!) 166/96 (BP Location: Left Arm, Patient Position: Sitting, Cuff Size: Normal)   Pulse 100   Resp 16   Wt  257 lb (116.6 kg)   SpO2 94%   BMI 41.48 kg/m   Weight yesterday- did not weigh Last visit weight- 254 lb  [name] was seen at home today and reported feeling well. He denied chest pain, SOB, headache, dizziness, orthopnea, fever or cough over the past week. He reported being compliant with his medications and his weight has been stable. He was found to be hypertensive however he had just taken his medications about one hour prior to my arrival. His medications were verified and his pillbox was refilled. I will follow up next week.  [name], EMT 05/03/20  ACTION: Home visit completed Next visit planned for 1 week

## 2020-05-09 ENCOUNTER — Telehealth (HOSPITAL_COMMUNITY): Payer: Self-pay

## 2020-05-09 NOTE — Telephone Encounter (Signed)
I called Mr Wernette to schedule an appointment. He did not answer so I left a message requesting he call me back. I will reach out again tomorrow.   [name], EMT 05/09/20

## 2020-05-10 ENCOUNTER — Telehealth (HOSPITAL_COMMUNITY): Payer: Self-pay

## 2020-05-10 ENCOUNTER — Other Ambulatory Visit (HOSPITAL_COMMUNITY): Payer: Self-pay

## 2020-05-10 NOTE — Progress Notes (Signed)
Paramedicine Encounter    Patient ID: Steven Chase, male    DOB: 24-Aug-1986, 34 y.o.   MRN: [id number]   Patient Care Team: Corwin Levins, MD as PCP - General (Internal Medicine) Nahser, Deloris Ping, MD as PCP - Cardiology (Cardiology) Burna Sis, LCSW as Social Worker (Licensed Clinical Social Worker)  Patient Active Problem List   Diagnosis Date Noted  . Elevated liver enzymes 02/06/2020  . CKD (chronic kidney disease) stage 3, GFR 30-59 ml/min 02/06/2020  . Diabetes mellitus type 2 in obese (HCC) 02/06/2020  . Vitamin D deficiency 02/06/2020  . Dyslipidemia (high LDL; low HDL) 02/06/2020  . Hypertriglyceridemia 02/06/2020  . Morbid obesity (HCC) 02/05/2020  . Acute gout of left ankle 01/04/2020  . AKI (acute kidney injury) (HCC) 11/10/2019  . Hyperkalemia 11/10/2019  . Acute exacerbation of CHF (congestive heart failure) (HCC) 07/15/2019  . Lipodystrophy 10/21/2018  . Chronic systolic heart failure (HCC) 02/17/2018  . Fatigue 02/17/2018  . Cardiomyopathy (HCC) 02/11/2018  . Hypertensive crisis 02/04/2018  . Chest pain 02/04/2018  . Allergic rhinitis 01/21/2018  . Hyperglycemia 12/02/2017  . Hypokalemia 12/02/2017  . Cough 12/02/2017  . Patellar tendonitis of left knee 12/02/2017  . Encounter for well adult exam with abnormal findings 12/10/2016  . Obesity 05/25/2016  . Lower leg edema 12/12/2014  . Essential hypertension 11/05/2014  . OSA (obstructive sleep apnea) 11/05/2014    Current Outpatient Medications:  .  allopurinol (ZYLOPRIM) 100 MG tablet, Take 1 tablet (100 mg total) by mouth 2 (two) times daily., Disp: 60 tablet, Rfl: 3 .  carvedilol (COREG) 25 MG tablet, Take 1 tablet (25 mg total) by mouth 2 (two) times daily with a meal., Disp: 60 tablet, Rfl: 3 .  fenofibrate 160 MG tablet, Take 1 tablet (160 mg total) by mouth daily., Disp: 90 tablet, Rfl: 2 .  hydrALAZINE (APRESOLINE) 100 MG tablet, Take 1 tablet (100 mg total) by mouth every 8 (eight) hours.,  Disp: 90 tablet, Rfl: 11 .  isosorbide mononitrate (IMDUR) 60 MG 24 hr tablet, Take 60 mg by mouth daily., Disp: , Rfl:  .  rosuvastatin (CRESTOR) 40 MG tablet, Take 1 tablet (40 mg total) by mouth daily at 6 PM., Disp: 90 tablet, Rfl: 3 .  sacubitril-valsartan (ENTRESTO) 24-26 MG, Take 1 tablet by mouth 2 (two) times daily., Disp: 180 tablet, Rfl: 3 .  spironolactone (ALDACTONE) 25 MG tablet, Take 0.5 tablets (12.5 mg total) by mouth daily., Disp: 45 tablet, Rfl: 3 .  torsemide (DEMADEX) 20 MG tablet, Take 1 tablet (20 mg total) by mouth daily., Disp: 90 tablet, Rfl: 3 .  MITIGARE 0.6 MG CAPS, Take 1 capsule by mouth daily. (Patient not taking: Reported on 03/19/2020), Disp: 30 capsule, Rfl: 5 .  omega-3 acid ethyl esters (LOVAZA) 1 g capsule, Take 2 capsules (2 g total) by mouth 2 (two) times daily. (Patient not taking: Reported on 05/10/2020), Disp: 120 capsule, Rfl: 6 .  predniSONE (DELTASONE) 10 MG tablet, 3 tabs by mouth per day for 3 days,2tabs per day for 3 days,1tab per day for 3 days (Patient not taking: Reported on 03/01/2020), Disp: 18 tablet, Rfl: 0 .  Vitamin D, Ergocalciferol, (DRISDOL) 1.25 MG (50000 UNIT) CAPS capsule, Take 1 capsule (50,000 Units total) by mouth every 7 (seven) days. (Patient not taking: Reported on 04/26/2020), Disp: 12 capsule, Rfl: 0 No Known Allergies    Social History   Socioeconomic History  . Marital status: Single    Spouse name: Not on  file  . Number of children: 1  . Years of education: 76  . Highest education level: Not on file  Occupational History  . 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 Smoking status: Never Smoker  . Smokeless tobacco: Never Used  Vaping Use  . Vaping Use: Never used  Substance and Sexual Activity  . Alcohol use: No  . Drug use: No  . Sexual activity: Yes    Birth control/protection: Condom  Other Topics Concern  . Not on file  Social History Narrative   Born and raised in [place],  [place]. Currently live in a private residence with his mother. Fun: Play with his daughter.   Denies religious beliefs that would effect health care.    Social Determinants of Health   Financial Resource Strain: Low Risk   . Difficulty of Paying Living Expenses: Not very hard  Food Insecurity: Food Insecurity Present  . Worried About Programme researcher, broadcasting/film/video in the Last Year: Sometimes true  . Ran Out of Food in the Last Year: Sometimes true  Transportation Needs: No Transportation Needs  . Lack of Transportation (Medical): No  . Lack of Transportation (Non-Medical): No  Physical Activity:   . Days of Exercise per Week:   . Minutes of Exercise per Session:   Stress:   . Feeling of Stress :   Social Connections:   . Frequency of Communication with Friends and Family:   . Frequency of Social Gatherings with Friends and Family:   . Attends Religious Services:   . Active Member of Clubs or Organizations:   . Attends Banker Meetings:   Marland Kitchen Marital Status:   Intimate Partner Violence:   . Fear of Current or Ex-Partner:   . Emotionally Abused:   Marland Kitchen Physically Abused:   . Sexually Abused:     Physical Exam Cardiovascular:     Rate and Rhythm: Normal rate and regular rhythm.     Pulses: Normal pulses.  Pulmonary:     Effort: Pulmonary effort is normal.     Breath sounds: Normal breath sounds.  Musculoskeletal:        General: Normal range of motion.     Right lower leg: Edema present.     Left lower leg: Edema present.  Skin:    General: Skin is warm and dry.     Capillary Refill: Capillary refill takes less than 2 seconds.  Neurological:     Mental Status: He is alert and oriented to person, place, and time.  Psychiatric:        Mood and Affect: Mood normal.         Future Appointments  Date Time Provider Department Center  05/16/2020  9:00 AM Hilty, Lisette Abu, MD [facility]    BP (!) 150/92 (BP Location: Left Arm, Patient Position: Sitting, Cuff Size:  Large)   Pulse 85   Resp 18   Wt 257 lb 6.4 oz (116.8 kg)   SpO2 94%   BMI 41.55 kg/m   Weight yesterday- did not weigh Last visit weight- 257 lb  Steven Chase was seen at home today and reported feeling well. He denied chest pain, SOB, headache, dizziness, orthopnea, fever or cough over the past week. He stated he has been compliant with his medications over the past week and his weight remains stable. His medications were verified however he has not picked up Vascepa so it was omitted from his box. We spent some  extra time today to let him begin filling his pillbox and he was able to do so with assistance. He stated he took his medications approximately 3 hours prior to my arrival and was still hypertensive. Given the wide fluctuation of his blood pressure despite medications compliance I believe his primary issue and this time is dietary. I will speak with CSW about signing him up for [employer] to try and reduce his sodium intake.   [name], EMT 05/10/20  ACTION: Home visit completed Next visit planned for 1 week

## 2020-05-10 NOTE — Telephone Encounter (Signed)
I called Steven Chase to see when he would be available for a visit today. He did not answer so I left a message requesting he call me back as soon as possible.   [name], EMT 05/10/20

## 2020-05-14 ENCOUNTER — Telehealth (HOSPITAL_COMMUNITY): Payer: Self-pay | Admitting: Licensed Clinical Social Worker

## 2020-05-14 NOTE — Telephone Encounter (Signed)
Patient identified as a candidate to receive 7 heart healthy meals per week for 4 weeks through [facility].  Completed referral sent in for review.  Anticipate patient will receive first shipment of food in 1-3 business days.  Steven Stokely H. Breea Loncar, LCSW Clinical Social Worker [facility] Desk#: [phone] Cell#: [phone]  

## 2020-05-16 ENCOUNTER — Telehealth: Payer: Self-pay

## 2020-05-16 ENCOUNTER — Telehealth (HOSPITAL_COMMUNITY): Payer: Self-pay

## 2020-05-16 ENCOUNTER — Telehealth: Payer: 59 | Admitting: Internal Medicine

## 2020-05-16 NOTE — Telephone Encounter (Signed)
I called Mr Steven Chase to schedule an appointment. He stated he was available tomorrow afternoon so we agreed to meet at 14:00.  [name], EMT 05/16/20

## 2020-05-16 NOTE — Telephone Encounter (Signed)
Lvm2cb to get pt ready for VV

## 2020-05-16 NOTE — Telephone Encounter (Signed)
LVM2CB to get ready for his vv

## 2020-05-17 ENCOUNTER — Other Ambulatory Visit (HOSPITAL_COMMUNITY): Payer: Self-pay

## 2020-05-17 NOTE — Progress Notes (Signed)
Paramedicine Encounter    Patient ID: Steven Chase, male    DOB: [birth year], 34 y.o.   MRN: [id number]   Patient Care Team: Corwin Levins, MD as PCP - General (Internal Medicine) Nahser, Deloris Ping, MD as PCP - Cardiology (Cardiology) Burna Sis, LCSW as Social Worker (Licensed Clinical Social Worker)  Patient Active Problem List   Diagnosis Date Noted  . Elevated liver enzymes 02/06/2020  . CKD (chronic kidney disease) stage 3, GFR 30-59 ml/min 02/06/2020  . Diabetes mellitus type 2 in obese (HCC) 02/06/2020  . Vitamin D deficiency 02/06/2020  . Dyslipidemia (high LDL; low HDL) 02/06/2020  . Hypertriglyceridemia 02/06/2020  . Morbid obesity (HCC) 02/05/2020  . Acute gout of left ankle 01/04/2020  . AKI (acute kidney injury) (HCC) 11/10/2019  . Hyperkalemia 11/10/2019  . Acute exacerbation of CHF (congestive heart failure) (HCC) 07/15/2019  . Lipodystrophy 10/21/2018  . Chronic systolic heart failure (HCC) 02/17/2018  . Fatigue 02/17/2018  . Cardiomyopathy (HCC) 02/11/2018  . Hypertensive crisis 02/04/2018  . Chest pain 02/04/2018  . Allergic rhinitis 01/21/2018  . Hyperglycemia 12/02/2017  . Hypokalemia 12/02/2017  . Cough 12/02/2017  . Patellar tendonitis of left knee 12/02/2017  . Encounter for well adult exam with abnormal findings 12/10/2016  . Obesity 05/25/2016  . Lower leg edema 12/12/2014  . Essential hypertension 11/05/2014  . OSA (obstructive sleep apnea) 11/05/2014    Current Outpatient Medications:  .  allopurinol (ZYLOPRIM) 100 MG tablet, Take 1 tablet (100 mg total) by mouth 2 (two) times daily., Disp: 60 tablet, Rfl: 3 .  carvedilol (COREG) 25 MG tablet, Take 1 tablet (25 mg total) by mouth 2 (two) times daily with a meal., Disp: 60 tablet, Rfl: 3 .  fenofibrate 160 MG tablet, Take 1 tablet (160 mg total) by mouth daily., Disp: 90 tablet, Rfl: 2 .  hydrALAZINE (APRESOLINE) 100 MG tablet, Take 1 tablet (100 mg total) by mouth every 8 (eight) hours.,  Disp: 90 tablet, Rfl: 11 .  isosorbide mononitrate (IMDUR) 60 MG 24 hr tablet, Take 60 mg by mouth daily., Disp: , Rfl:  .  MITIGARE 0.6 MG CAPS, Take 1 capsule by mouth daily. (Patient not taking: Reported on 03/19/2020), Disp: 30 capsule, Rfl: 5 .  omega-3 acid ethyl esters (LOVAZA) 1 g capsule, Take 2 capsules (2 g total) by mouth 2 (two) times daily. (Patient not taking: Reported on 05/10/2020), Disp: 120 capsule, Rfl: 6 .  predniSONE (DELTASONE) 10 MG tablet, 3 tabs by mouth per day for 3 days,2tabs per day for 3 days,1tab per day for 3 days (Patient not taking: Reported on 03/01/2020), Disp: 18 tablet, Rfl: 0 .  rosuvastatin (CRESTOR) 40 MG tablet, Take 1 tablet (40 mg total) by mouth daily at 6 PM., Disp: 90 tablet, Rfl: 3 .  sacubitril-valsartan (ENTRESTO) 24-26 MG, Take 1 tablet by mouth 2 (two) times daily., Disp: 180 tablet, Rfl: 3 .  spironolactone (ALDACTONE) 25 MG tablet, Take 0.5 tablets (12.5 mg total) by mouth daily., Disp: 45 tablet, Rfl: 3 .  torsemide (DEMADEX) 20 MG tablet, Take 1 tablet (20 mg total) by mouth daily., Disp: 90 tablet, Rfl: 3 .  Vitamin D, Ergocalciferol, (DRISDOL) 1.25 MG (50000 UNIT) CAPS capsule, Take 1 capsule (50,000 Units total) by mouth every 7 (seven) days. (Patient not taking: Reported on 04/26/2020), Disp: 12 capsule, Rfl: 0 No Known Allergies    Social History   Socioeconomic History  . Marital status: Single    Spouse name: Not on  file  . Number of children: 1  . Years of education: 1  . Highest education level: Not on file  Occupational History  . 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 Smoking status: Never Smoker  . Smokeless tobacco: Never Used  Vaping Use  . Vaping Use: Never used  Substance and Sexual Activity  . Alcohol use: No  . Drug use: No  . Sexual activity: Yes    Birth control/protection: Condom  Other Topics Concern  . Not on file  Social History Narrative   Born and raised in [place],  [place]. Currently live in a private residence with his mother. Fun: Play with his daughter.   Denies religious beliefs that would effect health care.    Social Determinants of Health   Financial Resource Strain: Low Risk   . Difficulty of Paying Living Expenses: Not very hard  Food Insecurity: Food Insecurity Present  . Worried About Programme researcher, broadcasting/film/video in the Last Year: Sometimes true  . Ran Out of Food in the Last Year: Sometimes true  Transportation Needs: No Transportation Needs  . Lack of Transportation (Medical): No  . Lack of Transportation (Non-Medical): No  Physical Activity:   . Days of Exercise per Week:   . Minutes of Exercise per Session:   Stress:   . Feeling of Stress :   Social Connections:   . Frequency of Communication with Friends and Family:   . Frequency of Social Gatherings with Friends and Family:   . Attends Religious Services:   . Active Member of Clubs or Organizations:   . Attends Banker Meetings:   Marland Kitchen Marital Status:   Intimate Partner Violence:   . Fear of Current or Ex-Partner:   . Emotionally Abused:   Marland Kitchen Physically Abused:   . Sexually Abused:     Physical Exam Cardiovascular:     Rate and Rhythm: Normal rate and regular rhythm.     Pulses: Normal pulses.  Pulmonary:     Effort: Pulmonary effort is normal.     Breath sounds: Normal breath sounds.  Musculoskeletal:        General: Normal range of motion.     Right lower leg: Edema present.     Left lower leg: Edema present.  Skin:    General: Skin is warm and dry.     Capillary Refill: Capillary refill takes less than 2 seconds.  Neurological:     Mental Status: He is alert and oriented to person, place, and time.  Psychiatric:        Mood and Affect: Mood normal.         No future appointments.  BP (!) 99/54 (BP Location: Left Arm, Patient Position: Sitting, Cuff Size: Normal)   Pulse 88   Resp 16   Wt 259 lb (117.5 kg)   SpO2 96%   BMI 41.80 kg/m   Weight  yesterday- 255 lb Last visit weight- 257 lb  Steven Chase was seen at home today and reported feeling well. He denied chest pain, SOB. Headache, dizziness, orthopnea, fever or cough over the past week. He stated he has been compliant with his medications but his weight is up 4 lbs since yesterday. He reported eating a hame and cheese sandwich last night which may be responsible for his weight increase due to its high sodium content. I advised him to avoid processed foods as best he can and he was agreeable. Additionally he is  scheduled to receive deliver of "[employer] Meals" today which will help with his diet. His medications were verified and his pillbox was checked for accuracy. He had missed putting isosorbide, fenofibrate and allopurinol in his pillbox so I made the correction and took the opportunity to stress the importance of taking his medications properly. Lastly he only has 5 days of rosuvastatin left in his pillbox so he has ordered it and will pick it up over the weekend. I will follow up next week.   [name], EMT 05/17/20  ACTION: Home visit completed Next visit planned for 1 week

## 2020-05-24 ENCOUNTER — Other Ambulatory Visit (HOSPITAL_COMMUNITY): Payer: Self-pay

## 2020-05-24 ENCOUNTER — Other Ambulatory Visit (HOSPITAL_COMMUNITY): Payer: Self-pay | Admitting: *Deleted

## 2020-05-24 MED ORDER — ISOSORBIDE MONONITRATE ER 60 MG PO TB24: 60.0000 mg | ORAL_TABLET | Freq: Every day | ORAL | 3 refills | Status: DC

## 2020-05-24 NOTE — Progress Notes (Signed)
Paramedicine Encounter    Patient ID: Steven Chase, male    DOB: [birth year], 34 y.o.   MRN: [id number]   Patient Care Team: Corwin Levins, MD as PCP - General (Internal Medicine) Nahser, Deloris Ping, MD as PCP - Cardiology (Cardiology) Burna Sis, LCSW as Social Worker (Licensed Clinical Social Worker)  Patient Active Problem List   Diagnosis Date Noted  . Elevated liver enzymes 02/06/2020  . CKD (chronic kidney disease) stage 3, GFR 30-59 ml/min 02/06/2020  . Diabetes mellitus type 2 in obese (HCC) 02/06/2020  . Vitamin D deficiency 02/06/2020  . Dyslipidemia (high LDL; low HDL) 02/06/2020  . Hypertriglyceridemia 02/06/2020  . Morbid obesity (HCC) 02/05/2020  . Acute gout of left ankle 01/04/2020  . AKI (acute kidney injury) (HCC) 11/10/2019  . Hyperkalemia 11/10/2019  . Acute exacerbation of CHF (congestive heart failure) (HCC) 07/15/2019  . Lipodystrophy 10/21/2018  . Chronic systolic heart failure (HCC) 02/17/2018  . Fatigue 02/17/2018  . Cardiomyopathy (HCC) 02/11/2018  . Hypertensive crisis 02/04/2018  . Chest pain 02/04/2018  . Allergic rhinitis 01/21/2018  . Hyperglycemia 12/02/2017  . Hypokalemia 12/02/2017  . Cough 12/02/2017  . Patellar tendonitis of left knee 12/02/2017  . Encounter for well adult exam with abnormal findings 12/10/2016  . Obesity 05/25/2016  . Lower leg edema 12/12/2014  . Essential hypertension 11/05/2014  . OSA (obstructive sleep apnea) 11/05/2014    Current Outpatient Medications:  .  hydrALAZINE (APRESOLINE) 100 MG tablet, Take 1 tablet (100 mg total) by mouth every 8 (eight) hours., Disp: 90 tablet, Rfl: 11 .  allopurinol (ZYLOPRIM) 100 MG tablet, Take 1 tablet (100 mg total) by mouth 2 (two) times daily., Disp: 60 tablet, Rfl: 3 .  carvedilol (COREG) 25 MG tablet, Take 1 tablet (25 mg total) by mouth 2 (two) times daily with a meal., Disp: 60 tablet, Rfl: 3 .  fenofibrate 160 MG tablet, Take 1 tablet (160 mg total) by mouth daily.,  Disp: 90 tablet, Rfl: 2 .  isosorbide mononitrate (IMDUR) 60 MG 24 hr tablet, Take 60 mg by mouth daily., Disp: , Rfl:  .  MITIGARE 0.6 MG CAPS, Take 1 capsule by mouth daily. (Patient not taking: Reported on 03/19/2020), Disp: 30 capsule, Rfl: 5 .  omega-3 acid ethyl esters (LOVAZA) 1 g capsule, Take 2 capsules (2 g total) by mouth 2 (two) times daily., Disp: 120 capsule, Rfl: 6 .  predniSONE (DELTASONE) 10 MG tablet, 3 tabs by mouth per day for 3 days,2tabs per day for 3 days,1tab per day for 3 days (Patient not taking: Reported on 03/01/2020), Disp: 18 tablet, Rfl: 0 .  rosuvastatin (CRESTOR) 40 MG tablet, Take 1 tablet (40 mg total) by mouth daily at 6 PM., Disp: 90 tablet, Rfl: 3 .  sacubitril-valsartan (ENTRESTO) 24-26 MG, Take 1 tablet by mouth 2 (two) times daily., Disp: 180 tablet, Rfl: 3 .  spironolactone (ALDACTONE) 25 MG tablet, Take 0.5 tablets (12.5 mg total) by mouth daily., Disp: 45 tablet, Rfl: 3 .  torsemide (DEMADEX) 20 MG tablet, Take 1 tablet (20 mg total) by mouth daily., Disp: 90 tablet, Rfl: 3 .  Vitamin D, Ergocalciferol, (DRISDOL) 1.25 MG (50000 UNIT) CAPS capsule, Take 1 capsule (50,000 Units total) by mouth every 7 (seven) days. (Patient not taking: Reported on 04/26/2020), Disp: 12 capsule, Rfl: 0 No Known Allergies    Social History   Socioeconomic History  . Marital status: Single    Spouse name: Not on file  . Number of children:  1  . Years of education: 60  . Highest education level: Not on file  Occupational History  . 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 Smoking status: Never Smoker  . Smokeless tobacco: Never Used  Vaping Use  . Vaping Use: Never used  Substance and Sexual Activity  . Alcohol use: No  . Drug use: No  . Sexual activity: Yes    Birth control/protection: Condom  Other Topics Concern  . Not on file  Social History Narrative   Born and raised in [place], [place]. Currently live in a private residence with  his mother. Fun: Play with his daughter.   Denies religious beliefs that would effect health care.    Social Determinants of Health   Financial Resource Strain: Low Risk   . Difficulty of Paying Living Expenses: Not very hard  Food Insecurity: Food Insecurity Present  . Worried About Programme researcher, broadcasting/film/video in the Last Year: Sometimes true  . Ran Out of Food in the Last Year: Sometimes true  Transportation Needs: No Transportation Needs  . Lack of Transportation (Medical): No  . Lack of Transportation (Non-Medical): No  Physical Activity:   . Days of Exercise per Week:   . Minutes of Exercise per Session:   Stress:   . Feeling of Stress :   Social Connections:   . Frequency of Communication with Friends and Family:   . Frequency of Social Gatherings with Friends and Family:   . Attends Religious Services:   . Active Member of Clubs or Organizations:   . Attends Banker Meetings:   Marland Kitchen Marital Status:   Intimate Partner Violence:   . Fear of Current or Ex-Partner:   . Emotionally Abused:   Marland Kitchen Physically Abused:   . Sexually Abused:     Physical Exam Cardiovascular:     Rate and Rhythm: Normal rate and regular rhythm.     Pulses: Normal pulses.  Pulmonary:     Effort: Pulmonary effort is normal.     Breath sounds: Normal breath sounds.  Musculoskeletal:        General: Normal range of motion.     Right lower leg: Edema present.     Left lower leg: Edema present.  Skin:    General: Skin is warm and dry.     Capillary Refill: Capillary refill takes less than 2 seconds.  Neurological:     Mental Status: He is alert and oriented to person, place, and time.  Psychiatric:        Mood and Affect: Mood normal.         No future appointments.  BP 115/67 (BP Location: Left Arm, Patient Position: Sitting, Cuff Size: Normal)   Pulse 89   Resp 16   Wt (!) 258 lb (117 kg)   SpO2 92%   BMI 41.64 kg/m   Weight yesterday- did not weigh Last visit weight- 259  lb  [name] was seen at home today and reported feeling well. He denied chest pain, SOB, headache, dizziness, orthopnea, fever or cough over the past week. He reported he has been compliant with his medications and his weight remains stable. His medications were verified and he was assisted in refilling his pillbox. He needs a new prescription of isosorbide so I contacted the HF clinic to have on sent to his pharmacy. I will follow up next week.  [name], EMT 05/24/20  ACTION: Home visit completed Next visit  planned for 1 week

## 2020-05-31 ENCOUNTER — Other Ambulatory Visit (HOSPITAL_COMMUNITY): Payer: Self-pay

## 2020-05-31 NOTE — Progress Notes (Signed)
Paramedicine Encounter    Patient ID: Steven Chase, male    DOB: 1986/06/21, 34 y.o.   MRN: [id number]   Patient Care Team: Steven Levins, MD as PCP - General (Internal Medicine) Nahser, Deloris Ping, MD as PCP - Cardiology (Cardiology) Steven Sis, LCSW as Social Worker (Licensed Clinical Social Worker)  Patient Active Problem List   Diagnosis Date Noted  . Elevated liver enzymes 02/06/2020  . CKD (chronic kidney disease) stage 3, GFR 30-59 ml/min 02/06/2020  . Diabetes mellitus type 2 in obese (HCC) 02/06/2020  . Vitamin D deficiency 02/06/2020  . Dyslipidemia (high LDL; low HDL) 02/06/2020  . Hypertriglyceridemia 02/06/2020  . Morbid obesity (HCC) 02/05/2020  . Acute gout of left ankle 01/04/2020  . AKI (acute kidney injury) (HCC) 11/10/2019  . Hyperkalemia 11/10/2019  . Acute exacerbation of CHF (congestive heart failure) (HCC) 07/15/2019  . Lipodystrophy 10/21/2018  . Chronic systolic heart failure (HCC) 02/17/2018  . Fatigue 02/17/2018  . Cardiomyopathy (HCC) 02/11/2018  . Hypertensive crisis 02/04/2018  . Chest pain 02/04/2018  . Allergic rhinitis 01/21/2018  . Hyperglycemia 12/02/2017  . Hypokalemia 12/02/2017  . Cough 12/02/2017  . Patellar tendonitis of left knee 12/02/2017  . Encounter for well adult exam with abnormal findings 12/10/2016  . Obesity 05/25/2016  . Lower leg edema 12/12/2014  . Essential hypertension 11/05/2014  . OSA (obstructive sleep apnea) 11/05/2014    Current Outpatient Medications:  .  allopurinol (ZYLOPRIM) 100 MG tablet, Take 1 tablet (100 mg total) by mouth 2 (two) times daily., Disp: 60 tablet, Rfl: 3 .  carvedilol (COREG) 25 MG tablet, Take 1 tablet (25 mg total) by mouth 2 (two) times daily with a meal., Disp: 60 tablet, Rfl: 3 .  fenofibrate 160 MG tablet, Take 1 tablet (160 mg total) by mouth daily., Disp: 90 tablet, Rfl: 2 .  hydrALAZINE (APRESOLINE) 100 MG tablet, Take 1 tablet (100 mg total) by mouth every 8 (eight) hours.,  Disp: 90 tablet, Rfl: 11 .  isosorbide mononitrate (IMDUR) 60 MG 24 hr tablet, Take 1 tablet (60 mg total) by mouth daily., Disp: 30 tablet, Rfl: 3 .  MITIGARE 0.6 MG CAPS, Take 1 capsule by mouth daily. (Patient not taking: Reported on 03/19/2020), Disp: 30 capsule, Rfl: 5 .  omega-3 acid ethyl esters (LOVAZA) 1 g capsule, Take 2 capsules (2 g total) by mouth 2 (two) times daily., Disp: 120 capsule, Rfl: 6 .  predniSONE (DELTASONE) 10 MG tablet, 3 tabs by mouth per day for 3 days,2tabs per day for 3 days,1tab per day for 3 days (Patient not taking: Reported on 03/01/2020), Disp: 18 tablet, Rfl: 0 .  rosuvastatin (CRESTOR) 40 MG tablet, Take 1 tablet (40 mg total) by mouth daily at 6 PM., Disp: 90 tablet, Rfl: 3 .  sacubitril-valsartan (ENTRESTO) 24-26 MG, Take 1 tablet by mouth 2 (two) times daily., Disp: 180 tablet, Rfl: 3 .  spironolactone (ALDACTONE) 25 MG tablet, Take 0.5 tablets (12.5 mg total) by mouth daily., Disp: 45 tablet, Rfl: 3 .  torsemide (DEMADEX) 20 MG tablet, Take 1 tablet (20 mg total) by mouth daily., Disp: 90 tablet, Rfl: 3 .  Vitamin D, Ergocalciferol, (DRISDOL) 1.25 MG (50000 UNIT) CAPS capsule, Take 1 capsule (50,000 Units total) by mouth every 7 (seven) days. (Patient not taking: Reported on 05/24/2020), Disp: 12 capsule, Rfl: 0 No Known Allergies    Social History   Socioeconomic History  . Marital status: Single    Spouse name: Not on file  .  Number of children: 1  . Years of education: 19  . Highest education level: Not on file  Occupational History  . 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 Smoking status: Never Smoker  . Smokeless tobacco: Never Used  Vaping Use  . Vaping Use: Never used  Substance and Sexual Activity  . Alcohol use: No  . Drug use: No  . Sexual activity: Yes    Birth control/protection: Condom  Other Topics Concern  . Not on file  Social History Narrative   Born and raised in [place], [place]. Currently live  in a private residence with his mother. Fun: Play with his daughter.   Denies religious beliefs that would effect health care.    Social Determinants of Health   Financial Resource Strain: Low Risk   . Difficulty of Paying Living Expenses: Not very hard  Food Insecurity: Food Insecurity Present  . Worried About Programme researcher, broadcasting/film/video in the Last Year: Sometimes true  . Ran Out of Food in the Last Year: Sometimes true  Transportation Needs: No Transportation Needs  . Lack of Transportation (Medical): No  . Lack of Transportation (Non-Medical): No  Physical Activity:   . Days of Exercise per Week:   . Minutes of Exercise per Session:   Stress:   . Feeling of Stress :   Social Connections:   . Frequency of Communication with Friends and Family:   . Frequency of Social Gatherings with Friends and Family:   . Attends Religious Services:   . Active Member of Clubs or Organizations:   . Attends Banker Meetings:   Marland Kitchen Marital Status:   Intimate Partner Violence:   . Fear of Current or Ex-Partner:   . Emotionally Abused:   Marland Kitchen Physically Abused:   . Sexually Abused:     Physical Exam Cardiovascular:     Rate and Rhythm: Normal rate and regular rhythm.     Pulses: Normal pulses.  Pulmonary:     Effort: Pulmonary effort is normal.     Breath sounds: Normal breath sounds.  Musculoskeletal:        General: Normal range of motion.     Right lower leg: Edema present.     Left lower leg: No edema.  Skin:    General: Skin is warm and dry.     Capillary Refill: Capillary refill takes less than 2 seconds.  Neurological:     Mental Status: He is alert and oriented to person, place, and time.  Psychiatric:        Mood and Affect: Mood normal.         No future appointments.  BP (!) 149/97 (BP Location: Left Arm, Patient Position: Sitting, Cuff Size: Large)   Pulse 96   Resp 16   Wt (!) 257 lb (116.6 kg)   SpO2 94%   BMI 41.48 kg/m   Weight yesterday- did not  weigh Last visit weight- 258 lb  Steven Chase was seen at home today and reported feeling well. He denied chest pain, SOB, headache, dizziness, orthopnea fever or cough over the past week. He reported being compliant with his medications and his weight has been stable. He was noted to be hypertensive and he had some swelling in his right lower leg. I asked about his diet and he said he was eating the "Mom's Meals" which the clinic provided to him and he stated he is drinking 4 bottles of water per day.  Upon throwing away pill bottles in his kitchen I noted several bags of [employer] and 2 liter bottles of juice on the counter. I brought this to his attention and reminded him of the salt content in each. His medications were verified and he was aided in filling his pillbox. I will follow up next week.   [name], EMT 05/31/20  ACTION: Home visit completed Next visit planned for 1 week

## 2020-06-06 ENCOUNTER — Telehealth (HOSPITAL_COMMUNITY): Payer: Self-pay

## 2020-06-06 NOTE — Telephone Encounter (Signed)
I called Mr Steven Chase to schedule an appointment. He stated he would not be able to meet today but advised he was free tomorrow. We agreed to meet tomorrow at 11:30.  [name], EMT 06/06/20

## 2020-06-07 ENCOUNTER — Telehealth (HOSPITAL_COMMUNITY): Payer: Self-pay | Admitting: Pharmacy Technician

## 2020-06-07 ENCOUNTER — Other Ambulatory Visit (HOSPITAL_COMMUNITY): Payer: Self-pay

## 2020-06-07 NOTE — Progress Notes (Signed)
Paramedicine Encounter    Patient ID: Steven Chase, male    DOB: [birth year], 34 y.o.   MRN: [id number]   Patient Care Team: Steven Levins, MD as PCP - General (Internal Medicine) Nahser, Deloris Ping, MD as PCP - Cardiology (Cardiology) Steven Sis, LCSW as Social Worker (Licensed Clinical Social Worker)  Patient Active Problem List   Diagnosis Date Noted   Elevated liver enzymes 02/06/2020   CKD (chronic kidney disease) stage 3, GFR 30-59 ml/min 02/06/2020   Diabetes mellitus type 2 in obese (HCC) 02/06/2020   Vitamin D deficiency 02/06/2020   Dyslipidemia (high LDL; low HDL) 02/06/2020   Hypertriglyceridemia 02/06/2020   Morbid obesity (HCC) 02/05/2020   Acute gout of left ankle 01/04/2020   AKI (acute kidney injury) (HCC) 11/10/2019   Hyperkalemia 11/10/2019   Acute exacerbation of CHF (congestive heart failure) (HCC) 07/15/2019   Lipodystrophy 10/21/2018   Chronic systolic heart failure (HCC) 02/17/2018   Fatigue 02/17/2018   Cardiomyopathy (HCC) 02/11/2018   Hypertensive crisis 02/04/2018   Chest pain 02/04/2018   Allergic rhinitis 01/21/2018   Hyperglycemia 12/02/2017   Hypokalemia 12/02/2017   Cough 12/02/2017   Patellar tendonitis of left knee 12/02/2017   Encounter for well adult exam with abnormal findings 12/10/2016   Obesity 05/25/2016   Lower leg edema 12/12/2014   Essential hypertension 11/05/2014   OSA (obstructive sleep apnea) 11/05/2014    Current Outpatient Medications:    allopurinol (ZYLOPRIM) 100 MG tablet, Take 1 tablet (100 mg total) by mouth 2 (two) times daily., Disp: 60 tablet, Rfl: 3   carvedilol (COREG) 25 MG tablet, Take 1 tablet (25 mg total) by mouth 2 (two) times daily with a meal., Disp: 60 tablet, Rfl: 3   fenofibrate 160 MG tablet, Take 1 tablet (160 mg total) by mouth daily., Disp: 90 tablet, Rfl: 2   hydrALAZINE (APRESOLINE) 100 MG tablet, Take 1 tablet (100 mg total) by mouth every 8 (eight) hours.,  Disp: 90 tablet, Rfl: 11   isosorbide mononitrate (IMDUR) 60 MG 24 hr tablet, Take 1 tablet (60 mg total) by mouth daily., Disp: 30 tablet, Rfl: 3   MITIGARE 0.6 MG CAPS, Take 1 capsule by mouth daily. (Patient not taking: Reported on 03/19/2020), Disp: 30 capsule, Rfl: 5   omega-3 acid ethyl esters (LOVAZA) 1 g capsule, Take 2 capsules (2 g total) by mouth 2 (two) times daily., Disp: 120 capsule, Rfl: 6   predniSONE (DELTASONE) 10 MG tablet, 3 tabs by mouth per day for 3 days,2tabs per day for 3 days,1tab per day for 3 days (Patient not taking: Reported on 03/01/2020), Disp: 18 tablet, Rfl: 0   rosuvastatin (CRESTOR) 40 MG tablet, Take 1 tablet (40 mg total) by mouth daily at 6 PM., Disp: 90 tablet, Rfl: 3   sacubitril-valsartan (ENTRESTO) 24-26 MG, Take 1 tablet by mouth 2 (two) times daily., Disp: 180 tablet, Rfl: 3   spironolactone (ALDACTONE) 25 MG tablet, Take 0.5 tablets (12.5 mg total) by mouth daily., Disp: 45 tablet, Rfl: 3   torsemide (DEMADEX) 20 MG tablet, Take 1 tablet (20 mg total) by mouth daily., Disp: 90 tablet, Rfl: 3   Vitamin D, Ergocalciferol, (DRISDOL) 1.25 MG (50000 UNIT) CAPS capsule, Take 1 capsule (50,000 Units total) by mouth every 7 (seven) days. (Patient not taking: Reported on 05/24/2020), Disp: 12 capsule, Rfl: 0 No Known Allergies    Social History   Socioeconomic History   Marital status: Single    Spouse name: Not on file  Number of children: 1   Years of education: 12   Highest education level: Not on file  Occupational History   Occupation: Unloader    Employer: [employer]  Tobacco Use   Smoking status: Never Smoker   Smokeless tobacco: Never Used  Vaping Use   Vaping Use: Never used  Substance and Sexual Activity   Alcohol use: No   Drug use: No   Sexual activity: Yes    Birth control/protection: Condom  Other Topics Concern   Not on file  Social History Narrative   Born and raised in [place], [place]. Currently live  in a private residence with his mother. Fun: Play with his daughter.   Denies religious beliefs that would effect health care.    Social Determinants of Health   Financial Resource Strain: Low Risk    Difficulty of Paying Living Expenses: Not very hard  Food Insecurity: Food Insecurity Present   Worried About Running Out of Food in the Last Year: Sometimes true   Ran Out of Food in the Last Year: Sometimes true  Transportation Needs: No Transportation Needs   Lack of Transportation (Medical): No   Lack of Transportation (Non-Medical): No  Physical Activity:    Days of Exercise per Week:    Minutes of Exercise per Session:   Stress:    Feeling of Stress :   Social Connections:    Frequency of Communication with Friends and Family:    Frequency of Social Gatherings with Friends and Family:    Attends Religious Services:    Active Member of Clubs or Organizations:    Attends Banker Meetings:    Marital Status:   Intimate Partner Violence:    Fear of Current or Ex-Partner:    Emotionally Abused:    Physically Abused:    Sexually Abused:     Physical Exam Cardiovascular:     Rate and Rhythm: Normal rate and regular rhythm.     Pulses: Normal pulses.  Pulmonary:     Effort: Pulmonary effort is normal.     Breath sounds: Normal breath sounds.  Musculoskeletal:        General: Normal range of motion.     Right lower leg: Edema present.     Left lower leg: Edema present.  Skin:    General: Skin is warm and dry.     Capillary Refill: Capillary refill takes less than 2 seconds.  Neurological:     Mental Status: He is alert and oriented to person, place, and time.  Psychiatric:        Mood and Affect: Mood normal.         No future appointments.  BP (!) 96/54 (BP Location: Left Arm, Patient Position: Sitting, Cuff Size: Normal)    Pulse 82    Resp 16    Wt 257 lb (116.6 kg)    SpO2 95%    BMI 41.48 kg/m   Weight yesterday- 254 lb Last  visit weight- 257 lb  Steven Chase was seen at home today and reported feeling well. He denied chest pain, SOB, headache, dizziness, orthopnea, fever or cough. He stated he has been compliant with his medications over the past week and his weight has been generally stable. He had a slight weight gain overnight last night but it brought him back even with his weight last week. His medications were verified and he was aided with filling his pillbox. I will follow up next week.   Leeann Must  Renae Gloss, EMT 06/07/20  ACTION: Home visit completed Next visit planned for 1 week

## 2020-06-07 NOTE — Telephone Encounter (Signed)
Received a notification that it is time to renew [employer] patient assistance for [employer]. Patient currently has a $0 co-pay, we will not seek re-enrollment at this time.  Archer Asa, CPhT

## 2020-06-09 ENCOUNTER — Ambulatory Visit (HOSPITAL_COMMUNITY)
Admission: EM | Admit: 2020-06-09 | Discharge: 2020-06-09 | Disposition: A | Discharge status: Home / Self Care | Payer: 59 | Attending: Emergency Medicine | Admitting: Emergency Medicine

## 2020-06-09 ENCOUNTER — Encounter (HOSPITAL_COMMUNITY): Payer: Self-pay

## 2020-06-09 ENCOUNTER — Other Ambulatory Visit: Payer: Self-pay

## 2020-06-09 DIAGNOSIS — M25562 Pain in left knee: Secondary | ICD-10-CM | POA: Diagnosis not present

## 2020-06-09 MED ORDER — TRAMADOL HCL 50 MG PO TABS: 50.0000 mg | ORAL_TABLET | Freq: Four times a day (QID) | ORAL | 0 refills | Status: DC | PRN

## 2020-06-09 NOTE — ED Triage Notes (Signed)
Pt presents with left knee pain after bumping it into door frame this morning.

## 2020-06-09 NOTE — Discharge Instructions (Signed)
Likely contusion of knee Ice  Elevate  Tylenol for mild to moderate pain 50--1000 mg every 4-6 hours Tramadol for severe pain  Follow up for any concerns

## 2020-06-10 NOTE — ED Provider Notes (Addendum)
[facility]    CSN: [id number] Arrival date & time: 06/09/20  1017      History   Chief Complaint Chief Complaint  Patient presents with  . Knee Pain    HPI Steven Chase is a 34 y.o. male presenting today for evaluation of knee pain.  Patient reports that this morning he was roughhousing with a friend and accidentally hit his left knee on a door.  Reports that it was at house store.  He has had some pain and limping with walking.  He denies difficulty bending.  He denies concern for fracture.  Requesting work note.  HPI  Past Medical History:  Diagnosis Date  . CHF (congestive heart failure) (HCC)   . Edema of both lower extremities   . Gout   . High cholesterol   . Hypertension   . Obesity   . OSA (obstructive sleep apnea) 11/05/2014   02/2015 -severe -AHI 106/h     Patient Active Problem List   Diagnosis Date Noted  . Elevated liver enzymes 02/06/2020  . CKD (chronic kidney disease) stage 3, GFR 30-59 ml/min 02/06/2020  . Diabetes mellitus type 2 in obese (HCC) 02/06/2020  . Vitamin D deficiency 02/06/2020  . Dyslipidemia (high LDL; low HDL) 02/06/2020  . Hypertriglyceridemia 02/06/2020  . Morbid obesity (HCC) 02/05/2020  . Acute gout of left ankle 01/04/2020  . AKI (acute kidney injury) (HCC) 11/10/2019  . Hyperkalemia 11/10/2019  . Acute exacerbation of CHF (congestive heart failure) (HCC) 07/15/2019  . Lipodystrophy 10/21/2018  . Chronic systolic heart failure (HCC) 02/17/2018  . Fatigue 02/17/2018  . Cardiomyopathy (HCC) 02/11/2018  . Hypertensive crisis 02/04/2018  . Chest pain 02/04/2018  . Allergic rhinitis 01/21/2018  . Hyperglycemia 12/02/2017  . Hypokalemia 12/02/2017  . Cough 12/02/2017  . Patellar tendonitis of left knee 12/02/2017  . Encounter for well adult exam with abnormal findings 12/10/2016  . Obesity 05/25/2016  . Lower leg edema 12/12/2014  . Essential hypertension 11/05/2014  . OSA (obstructive sleep apnea) 11/05/2014     Past Surgical History:  Procedure Laterality Date  . HERNIA REPAIR    . RIGHT/LEFT HEART CATH AND CORONARY ANGIOGRAPHY N/A 02/07/2018   Procedure: RIGHT/LEFT HEART CATH AND CORONARY ANGIOGRAPHY;  Surgeon: Marykay Lex, MD;  Location: [facility] INVASIVE CV LAB;  Service: Cardiovascular;  Laterality: N/A;       Home Medications    Prior to Admission medications   Medication Sig Start Date End Date Taking? Authorizing Provider  allopurinol (ZYLOPRIM) 100 MG tablet Take 1 tablet (100 mg total) by mouth 2 (two) times daily. 01/12/20 06/07/20  Nadara Mustard, MD  carvedilol (COREG) 25 MG tablet Take 1 tablet (25 mg total) by mouth 2 (two) times daily with a meal. 03/28/20   [name] M, PA-C  fenofibrate 160 MG tablet Take 1 tablet (160 mg total) by mouth daily. 11/30/19   Corwin Levins, MD  hydrALAZINE (APRESOLINE) 100 MG tablet Take 1 tablet (100 mg total) by mouth every 8 (eight) hours. 01/12/20   Bensimhon, Bevelyn Buckles, MD  isosorbide mononitrate (IMDUR) 60 MG 24 hr tablet Take 1 tablet (60 mg total) by mouth daily. 05/24/20   Simmons, [name] M, PA-C  MITIGARE 0.6 MG CAPS Take 1 capsule by mouth daily. Patient not taking: Reported on 03/19/2020 02/26/20   Corwin Levins, MD  omega-3 acid ethyl esters (LOVAZA) 1 g capsule Take 2 capsules (2 g total) by mouth 2 (two) times daily. 12/06/19  Bensimhon, Bevelyn Buckles, MD  predniSONE (DELTASONE) 10 MG tablet 3 tabs by mouth per day for 3 days,2tabs per day for 3 days,1tab per day for 3 days Patient not taking: Reported on 03/01/2020 02/20/20   Corwin Levins, MD  rosuvastatin (CRESTOR) 40 MG tablet Take 1 tablet (40 mg total) by mouth daily at 6 PM. 02/20/20   Corwin Levins, MD  sacubitril-valsartan (ENTRESTO) 24-26 MG Take 1 tablet by mouth 2 (two) times daily. 04/12/20   Allayne Butcher, PA-C  spironolactone (ALDACTONE) 25 MG tablet Take 0.5 tablets (12.5 mg total) by mouth daily. 11/21/19 06/07/20  [name] M, PA-C  torsemide (DEMADEX) 20 MG  tablet Take 1 tablet (20 mg total) by mouth daily. 11/16/19 06/07/20  Clegg, Amy D, NP  traMADol (ULTRAM) 50 MG tablet Take 1 tablet (50 mg total) by mouth every 6 (six) hours as needed. 06/09/20   Alanee Ting C, PA-C  Vitamin D, Ergocalciferol, (DRISDOL) 1.25 MG (50000 UNIT) CAPS capsule Take 1 capsule (50,000 Units total) by mouth every 7 (seven) days. Patient not taking: Reported on 05/24/2020 02/20/20   Corwin Levins, MD    Family History Family History  Problem Relation Age of Onset  . Hypertension Mother   . High Cholesterol Mother   . Depression Mother   . Anxiety disorder Mother   . Sleep apnea Mother   . Obesity Mother   . Heart disease Maternal Grandfather   . Hypertension Maternal Grandfather   . Hypertension Sister   . High Cholesterol Father   . High blood pressure Father   . Obesity Father     Social History Social History   Tobacco Use  . Smoking status: Never Smoker  . Smokeless tobacco: Never Used  Vaping Use  . Vaping Use: Never used  Substance Use Topics  . Alcohol use: No  . Drug use: No     Allergies   Patient has no known allergies.   Review of Systems Review of Systems  Constitutional: Negative for fatigue and fever.  Eyes: Negative for redness, itching and visual disturbance.  Respiratory: Negative for shortness of breath.   Cardiovascular: Negative for chest pain and leg swelling.  Gastrointestinal: Negative for nausea and vomiting.  Musculoskeletal: Positive for arthralgias and gait problem. Negative for myalgias.  Skin: Negative for color change, rash and wound.  Neurological: Negative for dizziness, syncope, weakness, light-headedness and headaches.     Physical Exam Triage Vital Signs ED Triage Vitals  Enc Vitals Group     BP 06/09/20 1041 (!) 187/144     Pulse Rate 06/09/20 1041 87     Resp 06/09/20 1041 20     Temp 06/09/20 1041 98.3 F (36.8 C)     Temp Source 06/09/20 1041 Oral     SpO2 06/09/20 1041 94 %     Weight --       Height --      Head Circumference --      Peak Flow --      Pain Score 06/09/20 1039 5     Pain Loc --      Pain Edu? --      Excl. in GC? --    No data found.  Updated Vital Signs BP (!) 187/144 (BP Location: Right Arm)   Pulse 87   Temp 98.3 F (36.8 C) (Oral)   Resp 20   SpO2 94%   Visual Acuity Right Eye Distance:   Left Eye Distance:  Bilateral Distance:    Right Eye Near:   Left Eye Near:    Bilateral Near:     Physical Exam Vitals and nursing note reviewed.  Constitutional:      Appearance: He is well-developed.     Comments: No acute distress  HENT:     Head: Normocephalic and atraumatic.     Nose: Nose normal.  Eyes:     Conjunctiva/sclera: Conjunctivae normal.  Cardiovascular:     Rate and Rhythm: Normal rate.  Pulmonary:     Effort: Pulmonary effort is normal. No respiratory distress.  Abdominal:     General: There is no distension.  Musculoskeletal:        General: Normal range of motion.     Cervical back: Neck supple.     Comments: Left knee: No obvious swelling deformity or discoloration, tenderness to palpation over patella, nontender to lateral and medial joint line, nontender and popliteal area, full active range of motion of knee No laxity appreciated with special testing Minimal antalgia  Skin:    General: Skin is warm and dry.  Neurological:     Mental Status: He is alert and oriented to person, place, and time.      UC Treatments / Results  Labs (all labs ordered are listed, but only abnormal results are displayed) Labs Reviewed - No data to display  EKG   Radiology No results found.  Procedures Procedures (including critical care time)  Medications Ordered in UC Medications - No data to display  Initial Impression / Assessment and Plan / UC Course  I have reviewed the triage vital signs and the nursing notes.  Pertinent labs & imaging results that were available during my care of the patient were reviewed by me and  considered in my medical decision making (see chart for details).     Patient declined concerns for fracture, deferring imaging.  Suspect most likely contusion based off mechanism of injury.  Recommending anti-inflammatories ice and elevation.  Patient is diabetic and has CKD, will defer steroids and NSAIDs.  Providing tramadol for more severe pain that is not relieved by Tylenol.  Monitor for gradual improvement.  Discussed strict return precautions. Patient verbalized understanding and is agreeable with plan.  Final Clinical Impressions(s) / UC Diagnoses   Final diagnoses:  Acute pain of left knee     Discharge Instructions     Likely contusion of knee Ice  Elevate  Tylenol for mild to moderate pain 50--1000 mg every 4-6 hours Tramadol for severe pain  Follow up for any concerns   ED Prescriptions    Medication Sig Dispense Auth. Provider   traMADol (ULTRAM) 50 MG tablet Take 1 tablet (50 mg total) by mouth every 6 (six) hours as needed. 8 tablet Saraya Tirey, [place] C, PA-C     I have reviewed the PDMP during this encounter.   Sharyon Cable [place] C, PA-C 06/10/20 1627    Patterson Hammersmith C, PA-C 06/10/20 1628

## 2020-06-14 ENCOUNTER — Other Ambulatory Visit: Payer: Self-pay | Admitting: Orthopedic Surgery

## 2020-06-14 ENCOUNTER — Telehealth (HOSPITAL_COMMUNITY): Payer: Self-pay | Admitting: *Deleted

## 2020-06-14 ENCOUNTER — Other Ambulatory Visit (HOSPITAL_COMMUNITY): Payer: Self-pay | Admitting: *Deleted

## 2020-06-14 ENCOUNTER — Other Ambulatory Visit (HOSPITAL_COMMUNITY): Payer: Self-pay

## 2020-06-14 DIAGNOSIS — I5022 Chronic systolic (congestive) heart failure: Secondary | ICD-10-CM

## 2020-06-14 MED ORDER — ISOSORBIDE MONONITRATE ER 60 MG PO TB24: 90.0000 mg | ORAL_TABLET | Freq: Every day | ORAL | 3 refills | Status: AC

## 2020-06-14 MED ORDER — SPIRONOLACTONE 25 MG PO TABS: 12.5000 mg | ORAL_TABLET | Freq: Every day | ORAL | 3 refills | Status: DC

## 2020-06-14 NOTE — Progress Notes (Signed)
Paramedicine Encounter    Patient ID: Steven Chase, male    DOB: 10-15-1986, 34 y.o.   MRN: [id number]   Patient Care Team: Steven Levins, MD as PCP - General (Internal Medicine) Nahser, Deloris Ping, MD as PCP - Cardiology (Cardiology) Steven Sis, LCSW as Social Worker (Licensed Clinical Social Worker)  Patient Active Problem List   Diagnosis Date Noted  . Elevated liver enzymes 02/06/2020  . CKD (chronic kidney disease) stage 3, GFR 30-59 ml/min 02/06/2020  . Diabetes mellitus type 2 in obese (HCC) 02/06/2020  . Vitamin D deficiency 02/06/2020  . Dyslipidemia (high LDL; low HDL) 02/06/2020  . Hypertriglyceridemia 02/06/2020  . Morbid obesity (HCC) 02/05/2020  . Acute gout of left ankle 01/04/2020  . AKI (acute kidney injury) (HCC) 11/10/2019  . Hyperkalemia 11/10/2019  . Acute exacerbation of CHF (congestive heart failure) (HCC) 07/15/2019  . Lipodystrophy 10/21/2018  . Chronic systolic heart failure (HCC) 02/17/2018  . Fatigue 02/17/2018  . Cardiomyopathy (HCC) 02/11/2018  . Hypertensive crisis 02/04/2018  . Chest pain 02/04/2018  . Allergic rhinitis 01/21/2018  . Hyperglycemia 12/02/2017  . Hypokalemia 12/02/2017  . Cough 12/02/2017  . Patellar tendonitis of left knee 12/02/2017  . Encounter for well adult exam with abnormal findings 12/10/2016  . Obesity 05/25/2016  . Lower leg edema 12/12/2014  . Essential hypertension 11/05/2014  . OSA (obstructive sleep apnea) 11/05/2014    Current Outpatient Medications:  .  allopurinol (ZYLOPRIM) 100 MG tablet, Take 1 tablet (100 mg total) by mouth 2 (two) times daily., Disp: 60 tablet, Rfl: 3 .  carvedilol (COREG) 25 MG tablet, Take 1 tablet (25 mg total) by mouth 2 (two) times daily with a meal., Disp: 60 tablet, Rfl: 3 .  fenofibrate 160 MG tablet, Take 1 tablet (160 mg total) by mouth daily., Disp: 90 tablet, Rfl: 2 .  hydrALAZINE (APRESOLINE) 100 MG tablet, Take 1 tablet (100 mg total) by mouth every 8 (eight) hours.,  Disp: 90 tablet, Rfl: 11 .  isosorbide mononitrate (IMDUR) 60 MG 24 hr tablet, Take 1 tablet (60 mg total) by mouth daily., Disp: 30 tablet, Rfl: 3 .  MITIGARE 0.6 MG CAPS, Take 1 capsule by mouth daily. (Patient not taking: Reported on 03/19/2020), Disp: 30 capsule, Rfl: 5 .  omega-3 acid ethyl esters (LOVAZA) 1 g capsule, Take 2 capsules (2 g total) by mouth 2 (two) times daily., Disp: 120 capsule, Rfl: 6 .  predniSONE (DELTASONE) 10 MG tablet, 3 tabs by mouth per day for 3 days,2tabs per day for 3 days,1tab per day for 3 days (Patient not taking: Reported on 03/01/2020), Disp: 18 tablet, Rfl: 0 .  rosuvastatin (CRESTOR) 40 MG tablet, Take 1 tablet (40 mg total) by mouth daily at 6 PM., Disp: 90 tablet, Rfl: 3 .  sacubitril-valsartan (ENTRESTO) 24-26 MG, Take 1 tablet by mouth 2 (two) times daily., Disp: 180 tablet, Rfl: 3 .  spironolactone (ALDACTONE) 25 MG tablet, Take 0.5 tablets (12.5 mg total) by mouth daily., Disp: 45 tablet, Rfl: 3 .  torsemide (DEMADEX) 20 MG tablet, Take 1 tablet (20 mg total) by mouth daily., Disp: 90 tablet, Rfl: 3 .  traMADol (ULTRAM) 50 MG tablet, Take 1 tablet (50 mg total) by mouth every 6 (six) hours as needed., Disp: 8 tablet, Rfl: 0 .  Vitamin D, Ergocalciferol, (DRISDOL) 1.25 MG (50000 UNIT) CAPS capsule, Take 1 capsule (50,000 Units total) by mouth every 7 (seven) days. (Patient not taking: Reported on 05/24/2020), Disp: 12 capsule, Rfl: 0  No Known Allergies    Social History   Socioeconomic History  . Marital status: Single    Spouse name: Not on file  . Number of children: 1  . Years of education: 86  . Highest education level: Not on file  Occupational History  . 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 Smoking status: Never Smoker  . Smokeless tobacco: Never Used  Vaping Use  . Vaping Use: Never used  Substance and Sexual Activity  . Alcohol use: No  . Drug use: No  . Sexual activity: Yes    Birth  control/protection: Condom  Other Topics Concern  . Not on file  Social History Narrative   Born and raised in [place], [place]. Currently live in a private residence with his mother. Fun: Play with his daughter.   Denies religious beliefs that would effect health care.    Social Determinants of Health   Financial Resource Strain: Low Risk   . Difficulty of Paying Living Expenses: Not very hard  Food Insecurity: Food Insecurity Present  . Worried About Programme researcher, broadcasting/film/video in the Last Year: Sometimes true  . Ran Out of Food in the Last Year: Sometimes true  Transportation Needs: No Transportation Needs  . Lack of Transportation (Medical): No  . Lack of Transportation (Non-Medical): No  Physical Activity:   . Days of Exercise per Week:   . Minutes of Exercise per Session:   Stress:   . Feeling of Stress :   Social Connections:   . Frequency of Communication with Friends and Family:   . Frequency of Social Gatherings with Friends and Family:   . Attends Religious Services:   . Active Member of Clubs or Organizations:   . Attends Banker Meetings:   Marland Kitchen Marital Status:   Intimate Partner Violence:   . Fear of Current or Ex-Partner:   . Emotionally Abused:   Marland Kitchen Physically Abused:   . Sexually Abused:     Physical Exam Cardiovascular:     Rate and Rhythm: Normal rate and regular rhythm.     Pulses: Normal pulses.  Pulmonary:     Effort: Pulmonary effort is normal.     Breath sounds: Normal breath sounds.  Musculoskeletal:        General: Normal range of motion.     Right lower leg: Edema present.     Left lower leg: Edema present.  Skin:    General: Skin is warm and dry.     Capillary Refill: Capillary refill takes less than 2 seconds.  Neurological:     Mental Status: He is alert and oriented to person, place, and time.  Psychiatric:        Mood and Affect: Mood normal.         No future appointments.  BP (!) 157/104 (BP Location: Left Arm, Patient  Position: Sitting, Cuff Size: Large)   Pulse 92   Resp 18   Wt 255 lb 12.8 oz (116 kg)   SpO2 95%   BMI 41.29 kg/m   Weight yesterday- 254 lb Last visit weight- 257 lb  Steven Chase was seen at home today and reported feeling well. He denied chest pain, SOB, headache, dizziness, orthopnea, fever or cough over the past week. He stated he has been compliant with his medications over the past week and his weight has been overall stable. Upon assessment he was noted to be hypertensive and exhibited BLEE. He confirmed that  he took his medications around 09:00 this morning and had not missed any over the past week. He stated he ate a taco salad last night but has otherwise been following a low sodium diet via [employer]. I contacted the HF clinic and was advised to increase isosorbide to 90 mg (1.5 tabs) daily. This change was made to his pillbox and he was scheduled for labs on Monday at 12:00. I will follow up next week.   [name], EMT 06/14/20  ACTION: Home visit completed Next visit planned for 1 week

## 2020-06-14 NOTE — Telephone Encounter (Signed)
Steven Chase with paramedicine called to report pts bp 157/104 no edema, weight down from last week. Bp last week 96/54. Per [name], PA Increase IMDUR to 1.5 tablets daily and get lab work (bmet bnp) Monday. Steven Chase and patient aware lab appt scheduled.

## 2020-06-17 ENCOUNTER — Telehealth (HOSPITAL_COMMUNITY): Payer: Self-pay | Admitting: Internal Medicine

## 2020-06-17 ENCOUNTER — Other Ambulatory Visit (HOSPITAL_COMMUNITY): Payer: 59

## 2020-06-17 NOTE — Telephone Encounter (Signed)
No show letter #1 sent.

## 2020-06-21 ENCOUNTER — Other Ambulatory Visit (HOSPITAL_COMMUNITY): Payer: Self-pay

## 2020-06-21 NOTE — Progress Notes (Signed)
Paramedicine Encounter    Patient ID: Steven Chase, male    DOB: April 06, 1986, 34 y.o.   MRN: [id number]   Patient Care Team: Corwin Levins, MD as PCP - General (Internal Medicine) Nahser, Deloris Ping, MD as PCP - Cardiology (Cardiology) Burna Sis, LCSW as Social Worker (Licensed Clinical Social Worker)  Patient Active Problem List   Diagnosis Date Noted  . Elevated liver enzymes 02/06/2020  . CKD (chronic kidney disease) stage 3, GFR 30-59 ml/min 02/06/2020  . Diabetes mellitus type 2 in obese (HCC) 02/06/2020  . Vitamin D deficiency 02/06/2020  . Dyslipidemia (high LDL; low HDL) 02/06/2020  . Hypertriglyceridemia 02/06/2020  . Morbid obesity (HCC) 02/05/2020  . Acute gout of left ankle 01/04/2020  . AKI (acute kidney injury) (HCC) 11/10/2019  . Hyperkalemia 11/10/2019  . Acute exacerbation of CHF (congestive heart failure) (HCC) 07/15/2019  . Lipodystrophy 10/21/2018  . Chronic systolic heart failure (HCC) 02/17/2018  . Fatigue 02/17/2018  . Cardiomyopathy (HCC) 02/11/2018  . Hypertensive crisis 02/04/2018  . Chest pain 02/04/2018  . Allergic rhinitis 01/21/2018  . Hyperglycemia 12/02/2017  . Hypokalemia 12/02/2017  . Cough 12/02/2017  . Patellar tendonitis of left knee 12/02/2017  . Encounter for well adult exam with abnormal findings 12/10/2016  . Obesity 05/25/2016  . Lower leg edema 12/12/2014  . Essential hypertension 11/05/2014  . OSA (obstructive sleep apnea) 11/05/2014    Current Outpatient Medications:  .  allopurinol (ZYLOPRIM) 100 MG tablet, Take 1 tablet by mouth twice daily, Disp: 60 tablet, Rfl: 0 .  carvedilol (COREG) 25 MG tablet, Take 1 tablet (25 mg total) by mouth 2 (two) times daily with a meal., Disp: 60 tablet, Rfl: 3 .  fenofibrate 160 MG tablet, Take 1 tablet (160 mg total) by mouth daily., Disp: 90 tablet, Rfl: 2 .  hydrALAZINE (APRESOLINE) 100 MG tablet, Take 1 tablet (100 mg total) by mouth every 8 (eight) hours., Disp: 90 tablet, Rfl: 11 .   isosorbide mononitrate (IMDUR) 60 MG 24 hr tablet, Take 1.5 tablets (90 mg total) by mouth daily., Disp: 45 tablet, Rfl: 3 .  rosuvastatin (CRESTOR) 40 MG tablet, Take 1 tablet (40 mg total) by mouth daily at 6 PM., Disp: 90 tablet, Rfl: 3 .  sacubitril-valsartan (ENTRESTO) 24-26 MG, Take 1 tablet by mouth 2 (two) times daily., Disp: 180 tablet, Rfl: 3 .  spironolactone (ALDACTONE) 25 MG tablet, Take 0.5 tablets (12.5 mg total) by mouth daily., Disp: 45 tablet, Rfl: 3 .  torsemide (DEMADEX) 20 MG tablet, Take 1 tablet (20 mg total) by mouth daily., Disp: 90 tablet, Rfl: 3 .  MITIGARE 0.6 MG CAPS, Take 1 capsule by mouth daily. (Patient not taking: Reported on 03/19/2020), Disp: 30 capsule, Rfl: 5 .  omega-3 acid ethyl esters (LOVAZA) 1 g capsule, Take 2 capsules (2 g total) by mouth 2 (two) times daily. (Patient not taking: Reported on 06/21/2020), Disp: 120 capsule, Rfl: 6 .  predniSONE (DELTASONE) 10 MG tablet, 3 tabs by mouth per day for 3 days,2tabs per day for 3 days,1tab per day for 3 days (Patient not taking: Reported on 03/01/2020), Disp: 18 tablet, Rfl: 0 .  traMADol (ULTRAM) 50 MG tablet, Take 1 tablet (50 mg total) by mouth every 6 (six) hours as needed. (Patient not taking: Reported on 06/21/2020), Disp: 8 tablet, Rfl: 0 .  Vitamin D, Ergocalciferol, (DRISDOL) 1.25 MG (50000 UNIT) CAPS capsule, Take 1 capsule (50,000 Units total) by mouth every 7 (seven) days. (Patient not taking: Reported  on 05/24/2020), Disp: 12 capsule, Rfl: 0 No Known Allergies    Social History   Socioeconomic History  . Marital status: Single    Spouse name: Not on file  . Number of children: 1  . Years of education: 41  . Highest education level: Not on file  Occupational History  . 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 Smoking status: Never Smoker  . Smokeless tobacco: Never Used  Vaping Use  . Vaping Use: Never used  Substance and Sexual Activity  . Alcohol use: No  .  Drug use: No  . Sexual activity: Yes    Birth control/protection: Condom  Other Topics Concern  . Not on file  Social History Narrative   Born and raised in [place], [place]. Currently live in a private residence with his mother. Fun: Play with his daughter.   Denies religious beliefs that would effect health care.    Social Determinants of Health   Financial Resource Strain: Low Risk   . Difficulty of Paying Living Expenses: Not very hard  Food Insecurity: Food Insecurity Present  . Worried About Programme researcher, broadcasting/film/video in the Last Year: Sometimes true  . Ran Out of Food in the Last Year: Sometimes true  Transportation Needs: No Transportation Needs  . Lack of Transportation (Medical): No  . Lack of Transportation (Non-Medical): No  Physical Activity:   . Days of Exercise per Week: Not on file  . Minutes of Exercise per Session: Not on file  Stress:   . Feeling of Stress : Not on file  Social Connections:   . Frequency of Communication with Friends and Family: Not on file  . Frequency of Social Gatherings with Friends and Family: Not on file  . Attends Religious Services: Not on file  . Active Member of Clubs or Organizations: Not on file  . Attends Banker Meetings: Not on file  . Marital Status: Not on file  Intimate Partner Violence:   . Fear of Current or Ex-Partner: Not on file  . Emotionally Abused: Not on file  . Physically Abused: Not on file  . Sexually Abused: Not on file    Physical Exam Cardiovascular:     Rate and Rhythm: Normal rate and regular rhythm.     Pulses: Normal pulses.  Pulmonary:     Effort: Pulmonary effort is normal.     Breath sounds: Normal breath sounds.  Musculoskeletal:        General: Normal range of motion.     Right lower leg: No edema.     Left lower leg: No edema.  Skin:    General: Skin is warm and dry.     Capillary Refill: Capillary refill takes less than 2 seconds.  Neurological:     Mental Status: He is alert and  oriented to person, place, and time.  Psychiatric:        Mood and Affect: Mood normal.         No future appointments.  BP 118/84 (BP Location: Left Arm, Patient Position: Sitting, Cuff Size: Large)   Pulse 87   Resp 18   Wt 256 lb (116.1 kg)   SpO2 95%   BMI 41.32 kg/m   Weight yesterday- 254 lb Last visit weight- 255 lb  Mr Verrette was seen at home today and reported feeling well. He denied chest pain, SOB, headache, dizziness, orthopnea, fever or cough. He reported being compliant with his medications however  he was out of Lovaza and has been since Sunday. This was ordered during our last visit but he just had not picked it up yet. Additionally he did not have enough allopurinol or isosorbide to fill his entire box. His box was filled through Monday and I will return on Tuesday morning to finish filling it. Mr Keelin advised he would be sure to have the medication by then.    [name], EMT 06/21/20  ACTION: Home visit completed Next visit planned for Tuesday

## 2020-07-04 ENCOUNTER — Telehealth (HOSPITAL_COMMUNITY): Payer: Self-pay

## 2020-07-04 NOTE — Telephone Encounter (Signed)
I called Mr Luzier to schedule an appointment. He did not answer so I left a message requesting he call me back.   [name], EMT

## 2020-07-05 ENCOUNTER — Other Ambulatory Visit (HOSPITAL_COMMUNITY): Payer: Self-pay

## 2020-07-05 NOTE — Progress Notes (Signed)
Paramedicine Encounter    Patient ID: Steven Chase, male    DOB: [birth year], 34 y.o.   MRN: [id number]   Patient Care Team: Corwin Levins, MD as PCP - General (Internal Medicine) Nahser, Deloris Ping, MD as PCP - Cardiology (Cardiology) Burna Sis, LCSW as Social Worker (Licensed Clinical Social Worker)  Patient Active Problem List   Diagnosis Date Noted  . Elevated liver enzymes 02/06/2020  . CKD (chronic kidney disease) stage 3, GFR 30-59 ml/min 02/06/2020  . Diabetes mellitus type 2 in obese (HCC) 02/06/2020  . Vitamin D deficiency 02/06/2020  . Dyslipidemia (high LDL; low HDL) 02/06/2020  . Hypertriglyceridemia 02/06/2020  . Morbid obesity (HCC) 02/05/2020  . Acute gout of left ankle 01/04/2020  . AKI (acute kidney injury) (HCC) 11/10/2019  . Hyperkalemia 11/10/2019  . Acute exacerbation of CHF (congestive heart failure) (HCC) 07/15/2019  . Lipodystrophy 10/21/2018  . Chronic systolic heart failure (HCC) 02/17/2018  . Fatigue 02/17/2018  . Cardiomyopathy (HCC) 02/11/2018  . Hypertensive crisis 02/04/2018  . Chest pain 02/04/2018  . Allergic rhinitis 01/21/2018  . Hyperglycemia 12/02/2017  . Hypokalemia 12/02/2017  . Cough 12/02/2017  . Patellar tendonitis of left knee 12/02/2017  . Encounter for well adult exam with abnormal findings 12/10/2016  . Obesity 05/25/2016  . Lower leg edema 12/12/2014  . Essential hypertension 11/05/2014  . OSA (obstructive sleep apnea) 11/05/2014    Current Outpatient Medications:  .  allopurinol (ZYLOPRIM) 100 MG tablet, Take 1 tablet by mouth twice daily, Disp: 60 tablet, Rfl: 0 .  carvedilol (COREG) 25 MG tablet, Take 1 tablet (25 mg total) by mouth 2 (two) times daily with a meal., Disp: 60 tablet, Rfl: 3 .  fenofibrate 160 MG tablet, Take 1 tablet (160 mg total) by mouth daily., Disp: 90 tablet, Rfl: 2 .  hydrALAZINE (APRESOLINE) 100 MG tablet, Take 1 tablet (100 mg total) by mouth every 8 (eight) hours., Disp: 90 tablet, Rfl: 11 .   isosorbide mononitrate (IMDUR) 60 MG 24 hr tablet, Take 1.5 tablets (90 mg total) by mouth daily., Disp: 45 tablet, Rfl: 3 .  MITIGARE 0.6 MG CAPS, Take 1 capsule by mouth daily. (Patient not taking: Reported on 03/19/2020), Disp: 30 capsule, Rfl: 5 .  omega-3 acid ethyl esters (LOVAZA) 1 g capsule, Take 2 capsules (2 g total) by mouth 2 (two) times daily. (Patient not taking: Reported on 06/21/2020), Disp: 120 capsule, Rfl: 6 .  predniSONE (DELTASONE) 10 MG tablet, 3 tabs by mouth per day for 3 days,2tabs per day for 3 days,1tab per day for 3 days (Patient not taking: Reported on 03/01/2020), Disp: 18 tablet, Rfl: 0 .  rosuvastatin (CRESTOR) 40 MG tablet, Take 1 tablet (40 mg total) by mouth daily at 6 PM., Disp: 90 tablet, Rfl: 3 .  sacubitril-valsartan (ENTRESTO) 24-26 MG, Take 1 tablet by mouth 2 (two) times daily., Disp: 180 tablet, Rfl: 3 .  spironolactone (ALDACTONE) 25 MG tablet, Take 0.5 tablets (12.5 mg total) by mouth daily., Disp: 45 tablet, Rfl: 3 .  torsemide (DEMADEX) 20 MG tablet, Take 1 tablet (20 mg total) by mouth daily., Disp: 90 tablet, Rfl: 3 .  traMADol (ULTRAM) 50 MG tablet, Take 1 tablet (50 mg total) by mouth every 6 (six) hours as needed. (Patient not taking: Reported on 06/21/2020), Disp: 8 tablet, Rfl: 0 .  Vitamin D, Ergocalciferol, (DRISDOL) 1.25 MG (50000 UNIT) CAPS capsule, Take 1 capsule (50,000 Units total) by mouth every 7 (seven) days. (Patient not taking: Reported  on 05/24/2020), Disp: 12 capsule, Rfl: 0 No Known Allergies    Social History   Socioeconomic History  . Marital status: Single    Spouse name: Not on file  . Number of children: 1  . Years of education: 49  . Highest education level: Not on file  Occupational History  . 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 Smoking status: Never Smoker  . Smokeless tobacco: Never Used  Vaping Use  . Vaping Use: Never used  Substance and Sexual Activity  . Alcohol use: No  .  Drug use: No  . Sexual activity: Yes    Birth control/protection: Condom  Other Topics Concern  . Not on file  Social History Narrative   Born and raised in [place], [place]. Currently live in a private residence with his mother. Fun: Play with his daughter.   Denies religious beliefs that would effect health care.    Social Determinants of Health   Financial Resource Strain: Low Risk   . Difficulty of Paying Living Expenses: Not very hard  Food Insecurity: Food Insecurity Present  . Worried About Programme researcher, broadcasting/film/video in the Last Year: Sometimes true  . Ran Out of Food in the Last Year: Sometimes true  Transportation Needs: No Transportation Needs  . Lack of Transportation (Medical): No  . Lack of Transportation (Non-Medical): No  Physical Activity:   . Days of Exercise per Week: Not on file  . Minutes of Exercise per Session: Not on file  Stress:   . Feeling of Stress : Not on file  Social Connections:   . Frequency of Communication with Friends and Family: Not on file  . Frequency of Social Gatherings with Friends and Family: Not on file  . Attends Religious Services: Not on file  . Active Member of Clubs or Organizations: Not on file  . Attends Banker Meetings: Not on file  . Marital Status: Not on file  Intimate Partner Violence:   . Fear of Current or Ex-Partner: Not on file  . Emotionally Abused: Not on file  . Physically Abused: Not on file  . Sexually Abused: Not on file    Physical Exam Cardiovascular:     Rate and Rhythm: Normal rate and regular rhythm.     Pulses: Normal pulses.  Pulmonary:     Effort: Pulmonary effort is normal.     Breath sounds: Normal breath sounds.  Musculoskeletal:        General: Normal range of motion.     Right lower leg: No edema.     Left lower leg: No edema.  Skin:    General: Skin is warm and dry.     Capillary Refill: Capillary refill takes less than 2 seconds.  Neurological:     Mental Status: He is alert and  oriented to person, place, and time.  Psychiatric:        Mood and Affect: Mood normal.         No future appointments.  BP 121/67 (BP Location: Left Arm, Patient Position: Sitting, Cuff Size: Normal)   Pulse 94   Resp 16   Wt 257 lb (116.6 kg)   SpO2 95%   BMI 41.48 kg/m   Weight yesterday- 258 lb Last visit weight- 256 lb  Mr Devereux was seen at home today and reported feeling well. He denied chest pain, SOB, headache, dizziness, orthopnea, fever or cough over the past week. He stated he has  been compliant with his medications over the past week and his weight has been stable. His medications were verified and he was able to fill his pillbox with minimal assistance. I will follow up next week.   [name], EMT 07/05/20  ACTION: Home visit completed Next visit planned for 1 week

## 2020-07-12 ENCOUNTER — Other Ambulatory Visit (HOSPITAL_COMMUNITY): Payer: Self-pay

## 2020-07-12 NOTE — Progress Notes (Signed)
Paramedicine Encounter    Patient ID: Steven Chase, male    DOB: 1986-07-08, 34 y.o.   MRN: [id number]   Patient Care Team: Corwin Levins, MD as PCP - General (Internal Medicine) Nahser, Deloris Ping, MD as PCP - Cardiology (Cardiology) Burna Sis, LCSW as Social Worker (Licensed Clinical Social Worker)  Patient Active Problem List   Diagnosis Date Noted  . Elevated liver enzymes 02/06/2020  . CKD (chronic kidney disease) stage 3, GFR 30-59 ml/min 02/06/2020  . Diabetes mellitus type 2 in obese (HCC) 02/06/2020  . Vitamin D deficiency 02/06/2020  . Dyslipidemia (high LDL; low HDL) 02/06/2020  . Hypertriglyceridemia 02/06/2020  . Morbid obesity (HCC) 02/05/2020  . Acute gout of left ankle 01/04/2020  . AKI (acute kidney injury) (HCC) 11/10/2019  . Hyperkalemia 11/10/2019  . Acute exacerbation of CHF (congestive heart failure) (HCC) 07/15/2019  . Lipodystrophy 10/21/2018  . Chronic systolic heart failure (HCC) 02/17/2018  . Fatigue 02/17/2018  . Cardiomyopathy (HCC) 02/11/2018  . Hypertensive crisis 02/04/2018  . Chest pain 02/04/2018  . Allergic rhinitis 01/21/2018  . Hyperglycemia 12/02/2017  . Hypokalemia 12/02/2017  . Cough 12/02/2017  . Patellar tendonitis of left knee 12/02/2017  . Encounter for well adult exam with abnormal findings 12/10/2016  . Obesity 05/25/2016  . Lower leg edema 12/12/2014  . Essential hypertension 11/05/2014  . OSA (obstructive sleep apnea) 11/05/2014    Current Outpatient Medications:  .  allopurinol (ZYLOPRIM) 100 MG tablet, Take 1 tablet by mouth twice daily, Disp: 60 tablet, Rfl: 0 .  carvedilol (COREG) 25 MG tablet, Take 1 tablet (25 mg total) by mouth 2 (two) times daily with a meal., Disp: 60 tablet, Rfl: 3 .  fenofibrate 160 MG tablet, Take 1 tablet (160 mg total) by mouth daily., Disp: 90 tablet, Rfl: 2 .  hydrALAZINE (APRESOLINE) 100 MG tablet, Take 1 tablet (100 mg total) by mouth every 8 (eight) hours., Disp: 90 tablet, Rfl: 11 .   isosorbide mononitrate (IMDUR) 60 MG 24 hr tablet, Take 1.5 tablets (90 mg total) by mouth daily., Disp: 45 tablet, Rfl: 3 .  MITIGARE 0.6 MG CAPS, Take 1 capsule by mouth daily. (Patient not taking: Reported on 03/19/2020), Disp: 30 capsule, Rfl: 5 .  omega-3 acid ethyl esters (LOVAZA) 1 g capsule, Take 2 capsules (2 g total) by mouth 2 (two) times daily., Disp: 120 capsule, Rfl: 6 .  predniSONE (DELTASONE) 10 MG tablet, 3 tabs by mouth per day for 3 days,2tabs per day for 3 days,1tab per day for 3 days (Patient not taking: Reported on 03/01/2020), Disp: 18 tablet, Rfl: 0 .  rosuvastatin (CRESTOR) 40 MG tablet, Take 1 tablet (40 mg total) by mouth daily at 6 PM., Disp: 90 tablet, Rfl: 3 .  sacubitril-valsartan (ENTRESTO) 24-26 MG, Take 1 tablet by mouth 2 (two) times daily., Disp: 180 tablet, Rfl: 3 .  spironolactone (ALDACTONE) 25 MG tablet, Take 0.5 tablets (12.5 mg total) by mouth daily., Disp: 45 tablet, Rfl: 3 .  torsemide (DEMADEX) 20 MG tablet, Take 1 tablet (20 mg total) by mouth daily., Disp: 90 tablet, Rfl: 3 .  traMADol (ULTRAM) 50 MG tablet, Take 1 tablet (50 mg total) by mouth every 6 (six) hours as needed. (Patient not taking: Reported on 06/21/2020), Disp: 8 tablet, Rfl: 0 .  Vitamin D, Ergocalciferol, (DRISDOL) 1.25 MG (50000 UNIT) CAPS capsule, Take 1 capsule (50,000 Units total) by mouth every 7 (seven) days. (Patient not taking: Reported on 05/24/2020), Disp: 12 capsule, Rfl:  0 No Known Allergies    Social History   Socioeconomic History  . Marital status: Single    Spouse name: Not on file  . Number of children: 1  . Years of education: 65  . Highest education level: Not on file  Occupational History  . 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 Smoking status: Never Smoker  . Smokeless tobacco: Never Used  Vaping Use  . Vaping Use: Never used  Substance and Sexual Activity  . Alcohol use: No  . Drug use: No  . Sexual activity: Yes     Birth control/protection: Condom  Other Topics Concern  . Not on file  Social History Narrative   Born and raised in [place], [place]. Currently live in a private residence with his mother. Fun: Play with his daughter.   Denies religious beliefs that would effect health care.    Social Determinants of Health   Financial Resource Strain: Low Risk   . Difficulty of Paying Living Expenses: Not very hard  Food Insecurity: Food Insecurity Present  . Worried About Programme researcher, broadcasting/film/video in the Last Year: Sometimes true  . Ran Out of Food in the Last Year: Sometimes true  Transportation Needs: No Transportation Needs  . Lack of Transportation (Medical): No  . Lack of Transportation (Non-Medical): No  Physical Activity:   . Days of Exercise per Week: Not on file  . Minutes of Exercise per Session: Not on file  Stress:   . Feeling of Stress : Not on file  Social Connections:   . Frequency of Communication with Friends and Family: Not on file  . Frequency of Social Gatherings with Friends and Family: Not on file  . Attends Religious Services: Not on file  . Active Member of Clubs or Organizations: Not on file  . Attends Banker Meetings: Not on file  . Marital Status: Not on file  Intimate Partner Violence:   . Fear of Current or Ex-Partner: Not on file  . Emotionally Abused: Not on file  . Physically Abused: Not on file  . Sexually Abused: Not on file    Physical Exam Cardiovascular:     Rate and Rhythm: Normal rate and regular rhythm.     Pulses: Normal pulses.  Pulmonary:     Effort: Pulmonary effort is normal.     Breath sounds: Normal breath sounds.  Musculoskeletal:        General: Normal range of motion.     Right lower leg: Edema present.     Left lower leg: Edema present.  Skin:    General: Skin is warm and dry.     Capillary Refill: Capillary refill takes less than 2 seconds.  Neurological:     Mental Status: He is alert and oriented to person, place, and  time.  Psychiatric:        Mood and Affect: Mood normal.         No future appointments.  BP 123/73 (BP Location: Left Arm, Patient Position: Sitting, Cuff Size: Normal)   Pulse 90   Resp 18   Wt 256 lb (116.1 kg)   SpO2 94%   BMI 41.32 kg/m   Weight yesterday- 259 lb Last visit weight- 257 lb  Steven Chase was seen at [facility] today and reported feeling well. He denied chest pain, SOB, headache, dizziness, orthopnea, fever or cough since our last visit. He stated he has been compliant with his medications and  his weigth has been stable. His medications were verified and he he had already refilled his pillbox. Upon checking it for accuracy I noted that he only put 1/2 tablet of isosorbide in the box rather that 1.5 tablet. I pointed this out and corrected it in the box and he was understanding for moving forward. No other mistakes were noted. I will follow up in two weeks.   [name], EMT 07/12/20  ACTION: Home visit completed Next visit planned for 2 weeks

## 2020-07-26 ENCOUNTER — Other Ambulatory Visit (HOSPITAL_COMMUNITY): Payer: Self-pay

## 2020-07-26 ENCOUNTER — Other Ambulatory Visit (HOSPITAL_COMMUNITY): Payer: Self-pay | Admitting: Cardiology

## 2020-07-26 ENCOUNTER — Other Ambulatory Visit: Payer: Self-pay | Admitting: Orthopedic Surgery

## 2020-07-26 ENCOUNTER — Other Ambulatory Visit (HOSPITAL_COMMUNITY): Payer: Self-pay | Admitting: Adult Health

## 2020-07-26 MED ORDER — TORSEMIDE 20 MG PO TABS: 20.0000 mg | ORAL_TABLET | Freq: Every day | ORAL | 3 refills | Status: DC

## 2020-07-26 NOTE — Progress Notes (Signed)
Paramedicine Encounter    Patient ID: Steven Chase, male    DOB: September 17, 1986, 34 y.o.   MRN: [id number]   Patient Care Team: Corwin Levins, MD as PCP - General (Internal Medicine) Nahser, Deloris Ping, MD as PCP - Cardiology (Cardiology) Burna Sis, LCSW as Social Worker (Licensed Clinical Social Worker)  Patient Active Problem List   Diagnosis Date Noted  . Elevated liver enzymes 02/06/2020  . CKD (chronic kidney disease) stage 3, GFR 30-59 ml/min 02/06/2020  . Diabetes mellitus type 2 in obese (HCC) 02/06/2020  . Vitamin D deficiency 02/06/2020  . Dyslipidemia (high LDL; low HDL) 02/06/2020  . Hypertriglyceridemia 02/06/2020  . Morbid obesity (HCC) 02/05/2020  . Acute gout of left ankle 01/04/2020  . AKI (acute kidney injury) (HCC) 11/10/2019  . Hyperkalemia 11/10/2019  . Acute exacerbation of CHF (congestive heart failure) (HCC) 07/15/2019  . Lipodystrophy 10/21/2018  . Chronic systolic heart failure (HCC) 02/17/2018  . Fatigue 02/17/2018  . Cardiomyopathy (HCC) 02/11/2018  . Hypertensive crisis 02/04/2018  . Chest pain 02/04/2018  . Allergic rhinitis 01/21/2018  . Hyperglycemia 12/02/2017  . Hypokalemia 12/02/2017  . Cough 12/02/2017  . Patellar tendonitis of left knee 12/02/2017  . Encounter for well adult exam with abnormal findings 12/10/2016  . Obesity 05/25/2016  . Lower leg edema 12/12/2014  . Essential hypertension 11/05/2014  . OSA (obstructive sleep apnea) 11/05/2014    Current Outpatient Medications:  .  allopurinol (ZYLOPRIM) 100 MG tablet, Take 1 tablet by mouth twice daily, Disp: 60 tablet, Rfl: 0 .  carvedilol (COREG) 25 MG tablet, Take 1 tablet (25 mg total) by mouth 2 (two) times daily with a meal., Disp: 60 tablet, Rfl: 3 .  fenofibrate 160 MG tablet, Take 1 tablet (160 mg total) by mouth daily., Disp: 90 tablet, Rfl: 2 .  hydrALAZINE (APRESOLINE) 100 MG tablet, Take 1 tablet (100 mg total) by mouth every 8 (eight) hours., Disp: 90 tablet, Rfl: 11 .   isosorbide mononitrate (IMDUR) 60 MG 24 hr tablet, Take 1.5 tablets (90 mg total) by mouth daily., Disp: 45 tablet, Rfl: 3 .  omega-3 acid ethyl esters (LOVAZA) 1 g capsule, Take 2 capsules (2 g total) by mouth 2 (two) times daily., Disp: 120 capsule, Rfl: 6 .  rosuvastatin (CRESTOR) 40 MG tablet, Take 1 tablet (40 mg total) by mouth daily at 6 PM., Disp: 90 tablet, Rfl: 3 .  sacubitril-valsartan (ENTRESTO) 24-26 MG, Take 1 tablet by mouth 2 (two) times daily., Disp: 180 tablet, Rfl: 3 .  spironolactone (ALDACTONE) 25 MG tablet, Take 0.5 tablets (12.5 mg total) by mouth daily., Disp: 45 tablet, Rfl: 3 .  torsemide (DEMADEX) 20 MG tablet, Take 1 tablet (20 mg total) by mouth daily., Disp: 90 tablet, Rfl: 3 .  MITIGARE 0.6 MG CAPS, Take 1 capsule by mouth daily. (Patient not taking: Reported on 03/19/2020), Disp: 30 capsule, Rfl: 5 .  predniSONE (DELTASONE) 10 MG tablet, 3 tabs by mouth per day for 3 days,2tabs per day for 3 days,1tab per day for 3 days (Patient not taking: Reported on 03/01/2020), Disp: 18 tablet, Rfl: 0 .  traMADol (ULTRAM) 50 MG tablet, Take 1 tablet (50 mg total) by mouth every 6 (six) hours as needed. (Patient not taking: Reported on 06/21/2020), Disp: 8 tablet, Rfl: 0 .  Vitamin D, Ergocalciferol, (DRISDOL) 1.25 MG (50000 UNIT) CAPS capsule, Take 1 capsule (50,000 Units total) by mouth every 7 (seven) days. (Patient not taking: Reported on 05/24/2020), Disp: 12 capsule, Rfl:  0 No Known Allergies    Social History   Socioeconomic History  . Marital status: Single    Spouse name: Not on file  . Number of children: 1  . Years of education: 56  . Highest education level: Not on file  Occupational History  . 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 Smoking status: Never Smoker  . Smokeless tobacco: Never Used  Vaping Use  . Vaping Use: Never used  Substance and Sexual Activity  . Alcohol use: No  . Drug use: No  . Sexual activity: Yes     Birth control/protection: Condom  Other Topics Concern  . Not on file  Social History Narrative   Born and raised in [place], [place]. Currently live in a private residence with his mother. Fun: Play with his daughter.   Denies religious beliefs that would effect health care.    Social Determinants of Health   Financial Resource Strain: Low Risk   . Difficulty of Paying Living Expenses: Not very hard  Food Insecurity: Food Insecurity Present  . Worried About Programme researcher, broadcasting/film/video in the Last Year: Sometimes true  . Ran Out of Food in the Last Year: Sometimes true  Transportation Needs: No Transportation Needs  . Lack of Transportation (Medical): No  . Lack of Transportation (Non-Medical): No  Physical Activity:   . Days of Exercise per Week: Not on file  . Minutes of Exercise per Session: Not on file  Stress:   . Feeling of Stress : Not on file  Social Connections:   . Frequency of Communication with Friends and Family: Not on file  . Frequency of Social Gatherings with Friends and Family: Not on file  . Attends Religious Services: Not on file  . Active Member of Clubs or Organizations: Not on file  . Attends Banker Meetings: Not on file  . Marital Status: Not on file  Intimate Partner Violence:   . Fear of Current or Ex-Partner: Not on file  . Emotionally Abused: Not on file  . Physically Abused: Not on file  . Sexually Abused: Not on file    Physical Exam Cardiovascular:     Rate and Rhythm: Normal rate and regular rhythm.     Pulses: Normal pulses.  Pulmonary:     Effort: Pulmonary effort is normal.     Breath sounds: Normal breath sounds.  Abdominal:     General: There is distension.  Musculoskeletal:        General: Normal range of motion.     Right lower leg: Edema present.     Left lower leg: Edema present.  Skin:    General: Skin is warm and dry.     Capillary Refill: Capillary refill takes less than 2 seconds.  Neurological:     Mental Status:  He is alert and oriented to person, place, and time.  Psychiatric:        Mood and Affect: Mood normal.         Future Appointments  Date Time Provider Department Center  08/07/2020 10:20 AM Corwin Levins, MD [id number] None    BP (!) 205/142 (BP Location: Left Arm, Patient Position: Sitting, Cuff Size: Large)   Pulse 86   Resp 18   Wt 271 lb (122.9 kg)   SpO2 92%   BMI 43.74 kg/m   Weight yesterday- did not weigh Last visit weight- 256 lb  Steven Chase was seen at home today  and reported feeling well. He denied chest pain, SOB, headache, dizziness, orthopnea, fever or cough over the past week. He stated heh as been compliant with his medications over the past two weeks however his weight was up 15 lb and his blood pressure was elevated. He also exhibited significant BLEE. I contacted the HF clinic who advised he needs to be brought in for a follow up visit but would not adjust his diuretics due to the length of times since his last appointment. He was instructed to avoid salty foods and ensure that he is compliant with his medications. He was told to call 911 or report the an emergency room if he begins having trouble breathing or stroke-like symptoms. He was understanding and agreeable. I was able to schedule an appointment for October 19 at 11:30 for him at the HF clinic. His medications were verified and his pillbox was refilled. I will follow up next week.  [name], EMT 07/26/20  ACTION: Home visit completed Next visit planned for 1 week

## 2020-08-01 ENCOUNTER — Ambulatory Visit: Payer: 59 | Admitting: Internal Medicine

## 2020-08-02 ENCOUNTER — Other Ambulatory Visit (HOSPITAL_COMMUNITY): Payer: Self-pay

## 2020-08-02 NOTE — Progress Notes (Signed)
Paramedicine Encounter    Patient ID: Steven Chase, male    DOB: 09-Nov-1985, 34 y.o.   MRN: [id number]   Patient Care Team: Corwin Levins, MD as PCP - General (Internal Medicine) Nahser, Deloris Ping, MD as PCP - Cardiology (Cardiology) Burna Sis, LCSW as Social Worker (Licensed Clinical Social Worker)  Patient Active Problem List   Diagnosis Date Noted  . Elevated liver enzymes 02/06/2020  . CKD (chronic kidney disease) stage 3, GFR 30-59 ml/min (HCC) 02/06/2020  . Diabetes mellitus type 2 in obese (HCC) 02/06/2020  . Vitamin D deficiency 02/06/2020  . Dyslipidemia (high LDL; low HDL) 02/06/2020  . Hypertriglyceridemia 02/06/2020  . Morbid obesity (HCC) 02/05/2020  . Acute gout of left ankle 01/04/2020  . AKI (acute kidney injury) (HCC) 11/10/2019  . Hyperkalemia 11/10/2019  . Acute exacerbation of CHF (congestive heart failure) (HCC) 07/15/2019  . Lipodystrophy 10/21/2018  . Chronic systolic heart failure (HCC) 02/17/2018  . Fatigue 02/17/2018  . Cardiomyopathy (HCC) 02/11/2018  . Hypertensive crisis 02/04/2018  . Chest pain 02/04/2018  . Allergic rhinitis 01/21/2018  . Hyperglycemia 12/02/2017  . Hypokalemia 12/02/2017  . Cough 12/02/2017  . Patellar tendonitis of left knee 12/02/2017  . Encounter for well adult exam with abnormal findings 12/10/2016  . Obesity 05/25/2016  . Lower leg edema 12/12/2014  . Essential hypertension 11/05/2014  . OSA (obstructive sleep apnea) 11/05/2014    Current Outpatient Medications:  .  carvedilol (COREG) 25 MG tablet, Take 1 tablet (25 mg total) by mouth 2 (two) times daily with a meal., Disp: 60 tablet, Rfl: 3 .  fenofibrate 160 MG tablet, Take 1 tablet (160 mg total) by mouth daily., Disp: 90 tablet, Rfl: 2 .  hydrALAZINE (APRESOLINE) 100 MG tablet, Take 1 tablet (100 mg total) by mouth every 8 (eight) hours., Disp: 90 tablet, Rfl: 11 .  isosorbide mononitrate (IMDUR) 60 MG 24 hr tablet, Take 1.5 tablets (90 mg total) by mouth  daily., Disp: 45 tablet, Rfl: 3 .  omega-3 acid ethyl esters (LOVAZA) 1 g capsule, Take 2 capsules (2 g total) by mouth 2 (two) times daily., Disp: 120 capsule, Rfl: 6 .  rosuvastatin (CRESTOR) 40 MG tablet, Take 1 tablet (40 mg total) by mouth daily at 6 PM., Disp: 90 tablet, Rfl: 3 .  sacubitril-valsartan (ENTRESTO) 24-26 MG, Take 1 tablet by mouth 2 (two) times daily., Disp: 180 tablet, Rfl: 3 .  spironolactone (ALDACTONE) 25 MG tablet, Take 0.5 tablets (12.5 mg total) by mouth daily., Disp: 45 tablet, Rfl: 3 .  torsemide (DEMADEX) 20 MG tablet, Take 1 tablet by mouth once daily, Disp: 90 tablet, Rfl: 0 .  allopurinol (ZYLOPRIM) 100 MG tablet, Take 1 tablet by mouth twice daily (Patient not taking: Reported on 08/02/2020), Disp: 60 tablet, Rfl: 0 .  MITIGARE 0.6 MG CAPS, Take 1 capsule by mouth daily. (Patient not taking: Reported on 03/19/2020), Disp: 30 capsule, Rfl: 5 .  predniSONE (DELTASONE) 10 MG tablet, 3 tabs by mouth per day for 3 days,2tabs per day for 3 days,1tab per day for 3 days (Patient not taking: Reported on 03/01/2020), Disp: 18 tablet, Rfl: 0 .  traMADol (ULTRAM) 50 MG tablet, Take 1 tablet (50 mg total) by mouth every 6 (six) hours as needed. (Patient not taking: Reported on 06/21/2020), Disp: 8 tablet, Rfl: 0 .  Vitamin D, Ergocalciferol, (DRISDOL) 1.25 MG (50000 UNIT) CAPS capsule, Take 1 capsule (50,000 Units total) by mouth every 7 (seven) days. (Patient not taking: Reported on  05/24/2020), Disp: 12 capsule, Rfl: 0 No Known Allergies    Social History   Socioeconomic History  . Marital status: Single    Spouse name: Not on file  . Number of children: 1  . Years of education: 4  . Highest education level: Not on file  Occupational History  . 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 Smoking status: Never Smoker  . Smokeless tobacco: Never Used  Vaping Use  . Vaping Use: Never used  Substance and Sexual Activity  . Alcohol use: No  .  Drug use: No  . Sexual activity: Yes    Birth control/protection: Condom  Other Topics Concern  . Not on file  Social History Narrative   Born and raised in [place], [place]. Currently live in a private residence with his mother. Fun: Play with his daughter.   Denies religious beliefs that would effect health care.    Social Determinants of Health   Financial Resource Strain:   . Difficulty of Paying Living Expenses: Not on file  Food Insecurity:   . Worried About Programme researcher, broadcasting/film/video in the Last Year: Not on file  . Ran Out of Food in the Last Year: Not on file  Transportation Needs:   . Lack of Transportation (Medical): Not on file  . Lack of Transportation (Non-Medical): Not on file  Physical Activity:   . Days of Exercise per Week: Not on file  . Minutes of Exercise per Session: Not on file  Stress:   . Feeling of Stress : Not on file  Social Connections:   . Frequency of Communication with Friends and Family: Not on file  . Frequency of Social Gatherings with Friends and Family: Not on file  . Attends Religious Services: Not on file  . Active Member of Clubs or Organizations: Not on file  . Attends Banker Meetings: Not on file  . Marital Status: Not on file  Intimate Partner Violence:   . Fear of Current or Ex-Partner: Not on file  . Emotionally Abused: Not on file  . Physically Abused: Not on file  . Sexually Abused: Not on file    Physical Exam Cardiovascular:     Rate and Rhythm: Regular rhythm. Tachycardia present.     Pulses: Normal pulses.  Pulmonary:     Effort: Pulmonary effort is normal.     Breath sounds: Normal breath sounds.  Musculoskeletal:        General: Normal range of motion.     Right lower leg: Edema present.     Left lower leg: Edema present.  Skin:    General: Skin is warm and dry.     Capillary Refill: Capillary refill takes less than 2 seconds.  Neurological:     Mental Status: He is alert and oriented to person, place, and  time.  Psychiatric:        Mood and Affect: Mood normal.         Future Appointments  Date Time Provider Department Center  08/07/2020 10:20 AM Corwin Levins, MD [facility] None  08/20/2020 11:30 AM [facility] PA/NP [facility] None    BP (!) 103/45 (BP Location: Left Arm, Patient Position: Sitting, Cuff Size: Large)   Pulse 100   Resp 16   Wt 274 lb 3.2 oz (124.4 kg)   SpO2 93%   BMI 44.26 kg/m   Weight yesterday- 277 lb Last visit weight- 271 lb  Mr Resurreccion was seen  at home today and reported feeling well. He denied chest pain, SOB, headache, dizziness, orthopnea, fever or cough. He stated he has been compliant with his medications over the past week and said the reason his pressure was high last week was due to not taking his evening medications the night before. His weight is still elevated but his last appointment with the HF clinic was in January and he does not have an appointment with the clinic until October 19. Due to the time since his last visit the clinic is unable to adjust diuretics at this time. His medications were verified and his pillbox was checked for accuracy. He did not have allopurinol but the pharmacy said it would be ready fr pick up tomorrow so he will add it then. I will follow up next week.   [name], EMT 08/02/20  ACTION: Home visit completed Next visit planned for 1 week

## 2020-08-05 ENCOUNTER — Encounter (HOSPITAL_COMMUNITY): Payer: Self-pay

## 2020-08-05 ENCOUNTER — Other Ambulatory Visit: Payer: Self-pay

## 2020-08-05 ENCOUNTER — Ambulatory Visit (HOSPITAL_COMMUNITY)
Admission: EM | Admit: 2020-08-05 | Discharge: 2020-08-05 | Disposition: A | Discharge status: Home / Self Care | Payer: 59 | Attending: Family Medicine | Admitting: Family Medicine

## 2020-08-05 DIAGNOSIS — R7303 Prediabetes: Secondary | ICD-10-CM

## 2020-08-05 DIAGNOSIS — Z20822 Contact with and (suspected) exposure to covid-19: Secondary | ICD-10-CM | POA: Diagnosis present

## 2020-08-05 LAB — SARS CORONAVIRUS 2 (TAT 6-24 HRS): SARS Coronavirus 2: NEGATIVE

## 2020-08-05 NOTE — Discharge Instructions (Signed)
Check my Chart for the test result Quarantine until the COVID test is available

## 2020-08-05 NOTE — ED Provider Notes (Signed)
[facility]    CSN: [id number] Arrival date & time: 08/05/20  1637      History   Chief Complaint Chief Complaint  Patient presents with  . Covid Exposure    HPI Steven Chase is a 34 y.o. male.   HPI   Patient states that he was notified today that he was exposed.  Covid from a coworker several days ago.  He has no symptoms.  No cough or cold.  No fever or chills.  No nausea or vomiting.  He wishes to see a physician. When I questioned him he tells me that is because of his severe right chest wall pain.  Hurts with pressure.  He points to his right pectoralis region.  No accident, no injury, no overuse.  It is not worse with movement.  It is not worse with deep breath.  It is not exertional  Is in poor health for 33.  He has a history of heart disease, hypertension, hyperlipidemia.  His chart says he has diabetes but he states he does not.  He has had hemoglobin A1c's in the 6 range, but his most recent one was 5.7.  I explained to him that with prediabetes he still has to be very careful with his diet in order not to develop diabetes.  Past Medical History:  Diagnosis Date  . CHF (congestive heart failure) (HCC)   . Edema of both lower extremities   . Gout   . High cholesterol   . Hypertension   . Obesity   . OSA (obstructive sleep apnea) 11/05/2014   02/2015 -severe -AHI 106/h     Patient Active Problem List   Diagnosis Date Noted  . Prediabetes 08/05/2020  . Elevated liver enzymes 02/06/2020  . CKD (chronic kidney disease) stage 3, GFR 30-59 ml/min (HCC) 02/06/2020  . Vitamin D deficiency 02/06/2020  . Dyslipidemia (high LDL; low HDL) 02/06/2020  . Hypertriglyceridemia 02/06/2020  . Morbid obesity (HCC) 02/05/2020  . Acute gout of left ankle 01/04/2020  . AKI (acute kidney injury) (HCC) 11/10/2019  . Acute exacerbation of CHF (congestive heart failure) (HCC) 07/15/2019  . Lipodystrophy 10/21/2018  . Chronic systolic heart failure (HCC) 02/17/2018  .  Fatigue 02/17/2018  . Cardiomyopathy (HCC) 02/11/2018  . Hypertensive crisis 02/04/2018  . Chest pain 02/04/2018  . Allergic rhinitis 01/21/2018  . Hyperglycemia 12/02/2017  . Patellar tendonitis of left knee 12/02/2017  . Encounter for well adult exam with abnormal findings 12/10/2016  . Obesity 05/25/2016  . Lower leg edema 12/12/2014  . Essential hypertension 11/05/2014  . OSA (obstructive sleep apnea) 11/05/2014    Past Surgical History:  Procedure Laterality Date  . HERNIA REPAIR    . RIGHT/LEFT HEART CATH AND CORONARY ANGIOGRAPHY N/A 02/07/2018   Procedure: RIGHT/LEFT HEART CATH AND CORONARY ANGIOGRAPHY;  Surgeon: Marykay Lex, MD;  Location: [facility] INVASIVE CV LAB;  Service: Cardiovascular;  Laterality: N/A;       Home Medications    Prior to Admission medications   Medication Sig Start Date End Date Taking? Authorizing Provider  fenofibrate 160 MG tablet Take 1 tablet (160 mg total) by mouth daily. 11/30/19   Corwin Levins, MD  hydrALAZINE (APRESOLINE) 100 MG tablet Take 1 tablet (100 mg total) by mouth every 8 (eight) hours. 01/12/20   Bensimhon, Bevelyn Buckles, MD  isosorbide mononitrate (IMDUR) 60 MG 24 hr tablet Take 1.5 tablets (90 mg total) by mouth daily. 06/14/20   [name] M, PA-C  omega-3 acid  ethyl esters (LOVAZA) 1 g capsule Take 2 capsules (2 g total) by mouth 2 (two) times daily. 12/06/19   Bensimhon, Bevelyn Buckles, MD  rosuvastatin (CRESTOR) 40 MG tablet Take 1 tablet (40 mg total) by mouth daily at 6 PM. 02/20/20   Corwin Levins, MD  sacubitril-valsartan (ENTRESTO) 24-26 MG Take 1 tablet by mouth 2 (two) times daily. 04/12/20   Allayne Butcher, PA-C  spironolactone (ALDACTONE) 25 MG tablet Take 0.5 tablets (12.5 mg total) by mouth daily. 06/14/20 09/12/20  [name] M, PA-C  torsemide (DEMADEX) 20 MG tablet Take 1 tablet by mouth once daily 07/26/20   Tonye Becket D, NP  allopurinol (ZYLOPRIM) 100 MG tablet Take 1 tablet by mouth twice daily Patient not  taking: Reported on 08/02/2020 07/26/20 08/05/20  Nadara Mustard, MD  carvedilol (COREG) 25 MG tablet Take 1 tablet (25 mg total) by mouth 2 (two) times daily with a meal. 03/28/20 08/05/20  Simmons, [name] M, PA-C  MITIGARE 0.6 MG CAPS Take 1 capsule by mouth daily. Patient not taking: Reported on 03/19/2020 02/26/20 08/05/20  Corwin Levins, MD    Family History Family History  Problem Relation Age of Onset  . Hypertension Mother   . High Cholesterol Mother   . Depression Mother   . Anxiety disorder Mother   . Sleep apnea Mother   . Obesity Mother   . Heart disease Maternal Grandfather   . Hypertension Maternal Grandfather   . Hypertension Sister   . High Cholesterol Father   . High blood pressure Father   . Obesity Father     Social History Social History   Tobacco Use  . Smoking status: Never Smoker  . Smokeless tobacco: Never Used  Vaping Use  . Vaping Use: Never used  Substance Use Topics  . Alcohol use: No  . Drug use: No     Allergies   Patient has no known allergies.   Review of Systems Review of Systems See HPI  Physical Exam Triage Vital Signs ED Triage Vitals [08/05/20 1836]  Enc Vitals Group     BP 117/69     Pulse Rate 82     Resp 20     Temp 98 F (36.7 C)     Temp Source Oral     SpO2 95 %     Weight      Height      Head Circumference      Peak Flow      Pain Score 0     Pain Loc      Pain Edu?      Excl. in GC?    No data found.  Updated Vital Signs BP 117/69 (BP Location: Right Arm)   Pulse 82   Temp 98 F (36.7 C) (Oral)   Resp 20   SpO2 95%      Physical Exam Constitutional:      General: He is not in acute distress.    Appearance: He is well-developed. He is obese.     Comments: No acute distress  HENT:     Head: Normocephalic and atraumatic.  Eyes:     Conjunctiva/sclera: Conjunctivae normal.     Pupils: Pupils are equal, round, and reactive to light.  Cardiovascular:     Rate and Rhythm: Normal rate and regular  rhythm.     Heart sounds: Normal heart sounds.  Pulmonary:     Effort: Pulmonary effort is normal. No respiratory distress.  Breath sounds: Normal breath sounds.     Comments: Heart and lung exam is normal Chest:     Chest wall: Tenderness present.  Abdominal:     General: There is no distension.     Palpations: Abdomen is soft.  Musculoskeletal:        General: Normal range of motion.     Cervical back: Normal range of motion.  Skin:    General: Skin is warm and dry.  Neurological:     Mental Status: He is alert.  Psychiatric:        Mood and Affect: Mood normal.        Behavior: Behavior normal.      UC Treatments / Results  Labs (all labs ordered are listed, but only abnormal results are displayed) Labs Reviewed  SARS CORONAVIRUS 2 (TAT 6-24 HRS)    EKG   Radiology No results found.  Procedures Procedures (including critical care time)  Medications Ordered in UC Medications - No data to display  Initial Impression / Assessment and Plan / UC Course  I have reviewed the triage vital signs and the nursing notes.  Pertinent labs & imaging results that were available during my care of the patient were reviewed by me and considered in my medical decision making (see chart for details).     Patient has chest pain that is reproducible with palpation of the right pectoralis muscle just below the right mid clavicle.  No palpable mass.  Normal lung and heart exam.  Musculoskeletal pain discussed with patient. Final Clinical Impressions(s) / UC Diagnoses   Final diagnoses:  Contact with and (suspected) exposure to covid-19     Discharge Instructions     Check my Chart for the test result Quarantine until the COVID test is available    ED Prescriptions    None     PDMP not reviewed this encounter.   Eustace Moore, MD 08/05/20 1924

## 2020-08-05 NOTE — ED Triage Notes (Signed)
Pt present possible covid exposure from a coworker. He was informed last Monday about coworker results.  Pt denies any symptom at this time

## 2020-08-07 ENCOUNTER — Encounter: Payer: Self-pay | Admitting: Internal Medicine

## 2020-08-07 ENCOUNTER — Ambulatory Visit: Payer: 59 | Admitting: Internal Medicine

## 2020-08-07 ENCOUNTER — Other Ambulatory Visit: Payer: Self-pay

## 2020-08-07 VITALS — BP 100/76 | HR 97 | Temp 98.4°F | Ht 66.0 in | Wt 272.0 lb

## 2020-08-07 DIAGNOSIS — E559 Vitamin D deficiency, unspecified: Secondary | ICD-10-CM

## 2020-08-07 DIAGNOSIS — Z23 Encounter for immunization: Secondary | ICD-10-CM

## 2020-08-07 DIAGNOSIS — R7303 Prediabetes: Secondary | ICD-10-CM | POA: Diagnosis not present

## 2020-08-07 DIAGNOSIS — Z0000 Encounter for general adult medical examination without abnormal findings: Secondary | ICD-10-CM

## 2020-08-07 DIAGNOSIS — N1831 Chronic kidney disease, stage 3a: Secondary | ICD-10-CM | POA: Diagnosis not present

## 2020-08-07 NOTE — Progress Notes (Signed)
Subjective:    Patient ID: Steven Chase, male    DOB: 11/01/1986, 34 y.o.   MRN: [id number]  HPI  Here for wellness and f/u;  Overall doing ok;  Pt denies Chest pain, worsening SOB, DOE, wheezing, orthopnea, PND, worsening LE edema, palpitations, dizziness or syncope.  Pt denies neurological change such as new headache, facial or extremity weakness.  Pt denies polydipsia, polyuria, or low sugar symptoms. Pt states overall good compliance with treatment and medications, good tolerability, and has been trying to follow appropriate diet.  Pt denies worsening depressive symptoms, suicidal ideation or panic. No fever, night sweats, wt loss, loss of appetite, or other constitutional symptoms.  Pt states good ability with ADL's, has low fall risk, home safety reviewed and adequate, no other significant changes in hearing or vision, and only occasionally active with exercise.  No new complaints Past Medical History:  Diagnosis Date  . CHF (congestive heart failure) (HCC)   . Edema of both lower extremities   . Gout   . High cholesterol   . Hypertension   . Obesity   . OSA (obstructive sleep apnea) 11/05/2014   02/2015 -severe -AHI 106/h    Past Surgical History:  Procedure Laterality Date  . HERNIA REPAIR    . RIGHT/LEFT HEART CATH AND CORONARY ANGIOGRAPHY N/A 02/07/2018   Procedure: RIGHT/LEFT HEART CATH AND CORONARY ANGIOGRAPHY;  Surgeon: Marykay Lex, MD;  Location: [facility] INVASIVE CV LAB;  Service: Cardiovascular;  Laterality: N/A;    reports that he has never smoked. He has never used smokeless tobacco. He reports that he does not drink alcohol and does not use drugs. family history includes Anxiety disorder in his mother; Depression in his mother; Heart disease in his maternal grandfather; High Cholesterol in his father and mother; High blood pressure in his father; Hypertension in his maternal grandfather, mother, and sister; Obesity in his father and mother; Sleep apnea in his mother. No  Known Allergies Current Outpatient Medications on File Prior to Visit  Medication Sig Dispense Refill  . fenofibrate 160 MG tablet Take 1 tablet (160 mg total) by mouth daily. 90 tablet 2  . hydrALAZINE (APRESOLINE) 100 MG tablet Take 1 tablet (100 mg total) by mouth every 8 (eight) hours. 90 tablet 11  . isosorbide mononitrate (IMDUR) 60 MG 24 hr tablet Take 1.5 tablets (90 mg total) by mouth daily. 45 tablet 3  . omega-3 acid ethyl esters (LOVAZA) 1 g capsule Take 2 capsules (2 g total) by mouth 2 (two) times daily. 120 capsule 6  . rosuvastatin (CRESTOR) 40 MG tablet Take 1 tablet (40 mg total) by mouth daily at 6 PM. 90 tablet 3  . sacubitril-valsartan (ENTRESTO) 24-26 MG Take 1 tablet by mouth 2 (two) times daily. 180 tablet 3  . spironolactone (ALDACTONE) 25 MG tablet Take 0.5 tablets (12.5 mg total) by mouth daily. 45 tablet 3  . torsemide (DEMADEX) 20 MG tablet Take 1 tablet by mouth once daily 90 tablet 0   No current facility-administered medications on file prior to visit.   Review of Systems All otherwise neg per pt    Objective:   Physical Exam BP 100/76 (BP Location: Left Arm, Patient Position: Sitting, Cuff Size: Large)   Pulse 97   Temp 98.4 F (36.9 C) (Oral)   Ht 5\' 6"  (1.676 m)   Wt 272 lb (123.4 kg)   SpO2 90%   BMI 43.90 kg/m  VS noted,  Constitutional: Pt appears in NAD HENT: Head:  NCAT.  Right Ear: External ear normal.  Left Ear: External ear normal.  Eyes: . Pupils are equal, round, and reactive to light. Conjunctivae and EOM are normal Nose: without d/c or deformity Neck: Neck supple. Gross normal ROM Cardiovascular: Normal rate and regular rhythm.   Pulmonary/Chest: Effort normal and breath sounds without rales or wheezing.  Abd:  Soft, NT, ND, + BS, no organomegaly Neurological: Pt is alert. At baseline orientation, motor grossly intact Skin: Skin is warm. No rashes, other new lesions, no LE edema Psychiatric: Pt behavior is normal without agitation   All otherwise neg per pt Lab Results  Component Value Date   WBC 11.7 (H) 11/13/2019   HGB 10.4 (L) 11/13/2019   HCT 30.2 (L) 11/13/2019   PLT 271 11/13/2019   GLUCOSE 112 (H) 04/12/2020   CHOL 270 (H) 02/05/2020   TRIG 347 (H) 02/05/2020   HDL 36 (L) 02/05/2020   LDLDIRECT 77.6 11/12/2019   LDLCALC 167 (H) 02/05/2020   ALT 56 (H) 02/05/2020   AST 47 (H) 02/05/2020   NA 142 04/12/2020   K 3.1 (L) 04/12/2020   CL 101 04/12/2020   CREATININE 2.27 (H) 04/12/2020   BUN 23 (H) 04/12/2020   CO2 30 04/12/2020   TSH 2.880 02/05/2020   INR 1.24 02/06/2018   HGBA1C 5.7 (H) 02/05/2020   MICROALBUR 0.42 08/24/2008      Assessment & Plan:

## 2020-08-07 NOTE — Patient Instructions (Addendum)
Please remember to get the COVID booster done about oct 15 or after  You had the flu shot today   0 Result Notes  Ref Range & Units 2 d ago 9 mo ago 1 yr ago  SARS Coronavirus 2 NEGATIVE NEGATIVE  NEGATIVE CM  NEGATIVE CM        Please continue all other medications as before, and refills have been done if requested.  Please have the pharmacy call with any other refills you may need.  Please continue your efforts at being more active, low cholesterol diet, and weight control.  You are otherwise up to date with prevention measures today.  Please keep your appointments with your specialists as you may have planned  Please go to the LAB at the blood drawing area for the tests to be done  You will be contacted by phone if any changes need to be made immediately.  Otherwise, you will receive a letter about your results with an explanation, but please check with MyChart first.  Please remember to sign up for MyChart if you have not done so, as this will be important to you in the future with finding out test results, communicating by private email, and scheduling acute appointments online when needed.  Please make an Appointment to return in 6 months, or sooner if needed

## 2020-08-09 ENCOUNTER — Other Ambulatory Visit (HOSPITAL_COMMUNITY): Payer: Self-pay

## 2020-08-09 ENCOUNTER — Other Ambulatory Visit (HOSPITAL_COMMUNITY): Payer: Self-pay | Admitting: *Deleted

## 2020-08-09 NOTE — Progress Notes (Signed)
Paramedicine Encounter    Patient ID: Steven Chase, male    DOB: 11-06-1985, 34 y.o.   MRN: [id number]   Patient Care Team: Steven Levins, MD as PCP - General (Internal Medicine) Nahser, Deloris Ping, MD as PCP - Cardiology (Cardiology) Steven Sis, LCSW as Social Worker (Licensed Clinical Social Worker)  Patient Active Problem List   Diagnosis Date Noted  . Prediabetes 08/05/2020  . Elevated liver enzymes 02/06/2020  . CKD (chronic kidney disease) stage 3, GFR 30-59 ml/min (HCC) 02/06/2020  . Vitamin D deficiency 02/06/2020  . Dyslipidemia (high LDL; low HDL) 02/06/2020  . Hypertriglyceridemia 02/06/2020  . Morbid obesity (HCC) 02/05/2020  . Acute gout of left ankle 01/04/2020  . AKI (acute kidney injury) (HCC) 11/10/2019  . Acute exacerbation of CHF (congestive heart failure) (HCC) 07/15/2019  . Lipodystrophy 10/21/2018  . Chronic systolic heart failure (HCC) 02/17/2018  . Fatigue 02/17/2018  . Cardiomyopathy (HCC) 02/11/2018  . Hypertensive crisis 02/04/2018  . Chest pain 02/04/2018  . Allergic rhinitis 01/21/2018  . Patellar tendonitis of left knee 12/02/2017  . Preventative health care 12/10/2016  . Obesity 05/25/2016  . Lower leg edema 12/12/2014  . Essential hypertension 11/05/2014  . OSA (obstructive sleep apnea) 11/05/2014    Current Outpatient Medications:  .  fenofibrate 160 MG tablet, Take 1 tablet (160 mg total) by mouth daily., Disp: 90 tablet, Rfl: 2 .  hydrALAZINE (APRESOLINE) 100 MG tablet, Take 1 tablet (100 mg total) by mouth every 8 (eight) hours., Disp: 90 tablet, Rfl: 11 .  isosorbide mononitrate (IMDUR) 60 MG 24 hr tablet, Take 1.5 tablets (90 mg total) by mouth daily., Disp: 45 tablet, Rfl: 3 .  omega-3 acid ethyl esters (LOVAZA) 1 g capsule, Take 2 capsules (2 g total) by mouth 2 (two) times daily., Disp: 120 capsule, Rfl: 6 .  rosuvastatin (CRESTOR) 40 MG tablet, Take 1 tablet (40 mg total) by mouth daily at 6 PM., Disp: 90 tablet, Rfl: 3 .   sacubitril-valsartan (ENTRESTO) 24-26 MG, Take 1 tablet by mouth 2 (two) times daily., Disp: 180 tablet, Rfl: 3 .  spironolactone (ALDACTONE) 25 MG tablet, Take 0.5 tablets (12.5 mg total) by mouth daily., Disp: 45 tablet, Rfl: 3 .  torsemide (DEMADEX) 20 MG tablet, Take 1 tablet by mouth once daily, Disp: 90 tablet, Rfl: 0 No Known Allergies    Social History   Socioeconomic History  . Marital status: Single    Spouse name: Not on file  . Number of children: 1  . Years of education: 43  . Highest education level: Not on file  Occupational History  . 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 Smoking status: Never Smoker  . Smokeless tobacco: Never Used  Vaping Use  . Vaping Use: Never used  Substance and Sexual Activity  . Alcohol use: No  . Drug use: No  . Sexual activity: Yes    Birth control/protection: Condom  Other Topics Concern  . Not on file  Social History Narrative   Born and raised in [place], [place]. Currently live in a private residence with his mother. Fun: Play with his daughter.   Denies religious beliefs that would effect health care.    Social Determinants of Health   Financial Resource Strain:   . Difficulty of Paying Living Expenses: Not on file  Food Insecurity:   . Worried About Programme researcher, broadcasting/film/video in the Last Year: Not on file  . Ran Out  of Food in the Last Year: Not on file  Transportation Needs:   . Lack of Transportation (Medical): Not on file  . Lack of Transportation (Non-Medical): Not on file  Physical Activity:   . Days of Exercise per Week: Not on file  . Minutes of Exercise per Session: Not on file  Stress:   . Feeling of Stress : Not on file  Social Connections:   . Frequency of Communication with Friends and Family: Not on file  . Frequency of Social Gatherings with Friends and Family: Not on file  . Attends Religious Services: Not on file  . Active Member of Clubs or Organizations: Not on file  .  Attends Banker Meetings: Not on file  . Marital Status: Not on file  Intimate Partner Violence:   . Fear of Current or Ex-Partner: Not on file  . Emotionally Abused: Not on file  . Physically Abused: Not on file  . Sexually Abused: Not on file    Physical Exam Cardiovascular:     Rate and Rhythm: Normal rate and regular rhythm.     Pulses: Normal pulses.  Pulmonary:     Effort: Pulmonary effort is normal.     Breath sounds: Normal breath sounds.  Musculoskeletal:        General: Normal range of motion.     Right lower leg: Edema present.     Left lower leg: Edema present.  Skin:    General: Skin is warm and dry.     Capillary Refill: Capillary refill takes less than 2 seconds.  Neurological:     Mental Status: He is alert and oriented to person, place, and time.  Psychiatric:        Mood and Affect: Mood normal.         Future Appointments  Date Time Provider Department Center  08/20/2020 11:30 AM [facility] PA/NP [facility] None  02/05/2021 11:00 AM Steven Levins, MD [facility] None    BP 131/65 (BP Location: Left Arm, Patient Position: Sitting, Cuff Size: Large)   Pulse 96   Resp 18   Wt 276 lb (125.2 kg)   SpO2 91%   BMI 44.55 kg/m   Weight yesterday- 280 lb Last visit weight- 274 lb  Steven Chase was seen at home today and rpeorted feeling well. He denied chest pain, SOB, headache, dizziness, orthopnea, fever or cough. He stated he has been compliant with his medications and his weight has been stable. His medications were verified and his pillbox was checked for accuracy. I will follow up next week.   Steven Chase, EMT 08/09/20  ACTION: Home visit completed Next visit planned for 1 week

## 2020-08-11 ENCOUNTER — Encounter: Payer: Self-pay | Admitting: Internal Medicine

## 2020-08-11 NOTE — Assessment & Plan Note (Signed)
stable overall by history and exam, recent data reviewed with pt, and pt to continue medical treatment as before,  to f/u any worsening symptoms or concerns  

## 2020-08-11 NOTE — Assessment & Plan Note (Signed)
For 2000 u qd 

## 2020-08-11 NOTE — Assessment & Plan Note (Signed)

## 2020-08-16 ENCOUNTER — Telehealth (HOSPITAL_COMMUNITY): Payer: Self-pay | Admitting: Licensed Clinical Social Worker

## 2020-08-16 NOTE — Telephone Encounter (Signed)
CSW called and spoke with pt regarding eligibility for COVID booster. Pt interested and gives verbal permission for CSW schedule appointment. CSW able to schedule appointment for pt at [facility].  Appointment made for 10/30 at 9:30 am. CSW called pt back and made him aware of the scheduled appointment. Pt confirms he has transportation to get to his appointment at that time. Encouraged him to call back with any additional questions as needed.  Octavio Graves, MSW, LCSW [facility] Clinical Social Work

## 2020-08-18 ENCOUNTER — Other Ambulatory Visit: Payer: Self-pay

## 2020-08-18 ENCOUNTER — Ambulatory Visit (HOSPITAL_COMMUNITY)
Admission: EM | Admit: 2020-08-18 | Discharge: 2020-08-18 | Disposition: A | Discharge status: Home / Self Care | Payer: 59 | Attending: Emergency Medicine | Admitting: Emergency Medicine

## 2020-08-18 ENCOUNTER — Encounter (HOSPITAL_COMMUNITY): Payer: Self-pay | Admitting: *Deleted

## 2020-08-18 DIAGNOSIS — R600 Localized edema: Secondary | ICD-10-CM | POA: Diagnosis present

## 2020-08-18 LAB — BASIC METABOLIC PANEL
Anion gap: 10 (ref 5–15)
BUN: 15 mg/dL (ref 6–20)
CO2: 32 mmol/L (ref 22–32)
Calcium: 9.8 mg/dL (ref 8.9–10.3)
Chloride: 99 mmol/L (ref 98–111)
Creatinine, Ser: 1.79 mg/dL — ABNORMAL HIGH (ref 0.61–1.24)
GFR, Estimated: 48 mL/min — ABNORMAL LOW (ref >60)
Glucose, Bld: 104 mg/dL — ABNORMAL HIGH (ref 70–99)
Potassium: 3.5 mmol/L (ref 3.5–5.1)
Sodium: 141 mmol/L (ref 135–145)

## 2020-08-18 NOTE — ED Provider Notes (Signed)
[facility]    CSN: [id number] Arrival date & time: 08/18/20  1707      History   Chief Complaint Chief Complaint  Patient presents with  . Leg Swelling    HPI Steven Chase is a 34 y.o. male.   Steven Chase presents with complaints of bilateral lower extremity edema which is worse than usual for him. No shortness of breath. History of chf and is followed by patient outreach and cardiology for this. Follows with his pcp, was seen on 10/6 without acute complaints. States he has been taking his medications as prescribed. Feels he hasn't been urinating as much as usual for him. Has been walking around his neighborhood which he feels has worsened his swelling. No calf pain.    ROS per HPI, negative if not otherwise mentioned.      Past Medical History:  Diagnosis Date  . CHF (congestive heart failure) (HCC)   . Edema of both lower extremities   . Gout   . High cholesterol   . Hypertension   . Obesity   . OSA (obstructive sleep apnea) 11/05/2014   02/2015 -severe -AHI 106/h     Patient Active Problem List   Diagnosis Date Noted  . Prediabetes 08/05/2020  . Elevated liver enzymes 02/06/2020  . CKD (chronic kidney disease) stage 3, GFR 30-59 ml/min (HCC) 02/06/2020  . Vitamin D deficiency 02/06/2020  . Dyslipidemia (high LDL; low HDL) 02/06/2020  . Hypertriglyceridemia 02/06/2020  . Morbid obesity (HCC) 02/05/2020  . Acute gout of left ankle 01/04/2020  . AKI (acute kidney injury) (HCC) 11/10/2019  . Acute exacerbation of CHF (congestive heart failure) (HCC) 07/15/2019  . Lipodystrophy 10/21/2018  . Chronic systolic heart failure (HCC) 02/17/2018  . Fatigue 02/17/2018  . Cardiomyopathy (HCC) 02/11/2018  . Hypertensive crisis 02/04/2018  . Chest pain 02/04/2018  . Allergic rhinitis 01/21/2018  . Patellar tendonitis of left knee 12/02/2017  . Preventative health care 12/10/2016  . Obesity 05/25/2016  . Lower leg edema 12/12/2014  . Essential  hypertension 11/05/2014  . OSA (obstructive sleep apnea) 11/05/2014    Past Surgical History:  Procedure Laterality Date  . HERNIA REPAIR    . RIGHT/LEFT HEART CATH AND CORONARY ANGIOGRAPHY N/A 02/07/2018   Procedure: RIGHT/LEFT HEART CATH AND CORONARY ANGIOGRAPHY;  Surgeon: Marykay Lex, MD;  Location: [facility] INVASIVE CV LAB;  Service: Cardiovascular;  Laterality: N/A;       Home Medications    Prior to Admission medications   Medication Sig Start Date End Date Taking? Authorizing Provider  allopurinol (ZYLOPRIM) 100 MG tablet Take 100 mg by mouth 2 (two) times daily.   Yes [provider]  carvedilol (COREG) 25 MG tablet Take 25 mg by mouth 2 (two) times daily with a meal.   Yes [provider]  colchicine 0.6 MG tablet Take 0.6 mg by mouth daily as needed.   Yes [provider]  fenofibrate 160 MG tablet Take 1 tablet (160 mg total) by mouth daily. 11/30/19  Yes Corwin Levins, MD  hydrALAZINE (APRESOLINE) 100 MG tablet Take 1 tablet (100 mg total) by mouth every 8 (eight) hours. 01/12/20  Yes Bensimhon, Bevelyn Buckles, MD  isosorbide mononitrate (IMDUR) 60 MG 24 hr tablet Take 1.5 tablets (90 mg total) by mouth daily. 06/14/20  Yes [name] M, PA-C  omega-3 acid ethyl esters (LOVAZA) 1 g capsule Take 2 capsules (2 g total) by mouth 2 (two) times daily. 12/06/19  Yes Bensimhon, Bevelyn Buckles,  MD  rosuvastatin (CRESTOR) 40 MG tablet Take 1 tablet (40 mg total) by mouth daily at 6 PM. 02/20/20  Yes Corwin Levins, MD  sacubitril-valsartan (ENTRESTO) 24-26 MG Take 1 tablet by mouth 2 (two) times daily. 04/12/20  Yes [name] M, PA-C  spironolactone (ALDACTONE) 25 MG tablet Take 0.5 tablets (12.5 mg total) by mouth daily. 06/14/20 09/12/20 Yes Simmons, [name] M, PA-C  torsemide (DEMADEX) 20 MG tablet Take 1 tablet by mouth once daily 07/26/20  Yes Clegg, Amy D, NP    Family History Family History  Problem Relation Age of Onset  . Hypertension Mother   .  High Cholesterol Mother   . Depression Mother   . Anxiety disorder Mother   . Sleep apnea Mother   . Obesity Mother   . Heart disease Maternal Grandfather   . Hypertension Maternal Grandfather   . Hypertension Sister   . High Cholesterol Father   . High blood pressure Father   . Obesity Father     Social History Social History   Tobacco Use  . Smoking status: Never Smoker  . Smokeless tobacco: Never Used  Vaping Use  . Vaping Use: Never used  Substance Use Topics  . Alcohol use: No  . Drug use: No     Allergies   Patient has no known allergies.   Review of Systems Review of Systems   Physical Exam Triage Vital Signs ED Triage Vitals  Enc Vitals Group     BP 08/18/20 1813 129/71     Pulse Rate 08/18/20 1813 85     Resp 08/18/20 1813 20     Temp 08/18/20 1813 97.9 F (36.6 C)     Temp Source 08/18/20 1813 Oral     SpO2 08/18/20 1813 96 %     Weight 08/18/20 1851 279 lb 6.4 oz (126.7 kg)     Height --      Head Circumference --      Peak Flow --      Pain Score 08/18/20 1812 0     Pain Loc --      Pain Edu? --      Excl. in GC? --    No data found.  Updated Vital Signs BP 129/71   Pulse 85   Temp 97.9 F (36.6 C) (Oral)   Resp 20   Wt 279 lb 6.4 oz (126.7 kg)   SpO2 96%   BMI 45.10 kg/m    Physical Exam Constitutional:      Appearance: He is well-developed.  Cardiovascular:     Rate and Rhythm: Normal rate.  Pulmonary:     Effort: Pulmonary effort is normal.     Comments: No work of breathing, no wheezing or crackles; patient falls asleep while waiting, per chart review this appears to be consistent for him in the past as well  Musculoskeletal:     Right lower leg: 2+ Pitting Edema present.     Left lower leg: 2+ Pitting Edema present.  Skin:    General: Skin is warm and dry.  Neurological:     Mental Status: He is alert and oriented to person, place, and time.      UC Treatments / Results  Labs (all labs ordered are listed, but  only abnormal results are displayed) Labs Reviewed  BASIC METABOLIC PANEL    EKG   Radiology No results found.  Procedures Procedures (including critical care time)  Medications Ordered in UC Medications - No data to  display  Initial Impression / Assessment and Plan / UC Course  I have reviewed the triage vital signs and the nursing notes.  Pertinent labs & imaging results that were available during my care of the patient were reviewed by me and considered in my medical decision making (see chart for details).    Increased lower extremity edema, per patient. Ace wraps placed, encouraged elevation, increase torsemide for the next three days with BMP pending. To call cardiology tomorrow to set up recheck next week. Return precautions provided. Work note provided. Patient verbalized understanding and agreeable to plan.  Ambulatory out of clinic without difficulty.     Final Clinical Impressions(s) / UC Diagnoses   Final diagnoses:  Lower extremity edema     Discharge Instructions     Elevate your legs, use of ace wraps to help with swelling.  Monitor and limit salt in your diet.  Please take 2 tablets a day, 40mg  total, of your torsemide, for the next 3 days to try to help with your swelling.  Call your cardiologist tomorrow for a follow up appointment and recheck next week.  Please go to the ER for any worsening of symptoms such as shortness of breath     ED Prescriptions    None     PDMP not reviewed this encounter.   , NP 08/18/20 1942

## 2020-08-18 NOTE — ED Triage Notes (Addendum)
C/O BLE edema x 2-3 days.  States has been on diuretics "for a while".  Denies any SOB.

## 2020-08-18 NOTE — ED Notes (Signed)
2 ace wraps to both lower extremities, bilateral below the knee

## 2020-08-18 NOTE — Discharge Instructions (Signed)
Elevate your legs, use of ace wraps to help with swelling.  Monitor and limit salt in your diet.  Please take 2 tablets a day, 40mg  total, of your torsemide, for the next 3 days to try to help with your swelling.  Call your cardiologist tomorrow for a follow up appointment and recheck next week.  Please go to the ER for any worsening of symptoms such as shortness of breath

## 2020-08-19 ENCOUNTER — Encounter (HOSPITAL_COMMUNITY): Payer: Self-pay | Admitting: Emergency Medicine

## 2020-08-19 ENCOUNTER — Emergency Department (HOSPITAL_COMMUNITY): Payer: 59

## 2020-08-19 ENCOUNTER — Emergency Department (HOSPITAL_COMMUNITY)
Admission: EM | Admit: 2020-08-19 | Discharge: 2020-08-20 | Disposition: A | Discharge status: Home / Self Care | Payer: 59 | Attending: Emergency Medicine | Admitting: Emergency Medicine

## 2020-08-19 DIAGNOSIS — R0602 Shortness of breath: Secondary | ICD-10-CM | POA: Diagnosis present

## 2020-08-19 DIAGNOSIS — I5041 Acute combined systolic (congestive) and diastolic (congestive) heart failure: Secondary | ICD-10-CM | POA: Diagnosis not present

## 2020-08-19 DIAGNOSIS — I130 Hypertensive heart and chronic kidney disease with heart failure and stage 1 through stage 4 chronic kidney disease, or unspecified chronic kidney disease: Secondary | ICD-10-CM | POA: Insufficient documentation

## 2020-08-19 DIAGNOSIS — Z79899 Other long term (current) drug therapy: Secondary | ICD-10-CM | POA: Diagnosis not present

## 2020-08-19 DIAGNOSIS — N183 Chronic kidney disease, stage 3 unspecified: Secondary | ICD-10-CM | POA: Diagnosis not present

## 2020-08-19 LAB — BASIC METABOLIC PANEL
Anion gap: 10 (ref 5–15)
BUN: 15 mg/dL (ref 6–20)
CO2: 29 mmol/L (ref 22–32)
Calcium: 10 mg/dL (ref 8.9–10.3)
Chloride: 102 mmol/L (ref 98–111)
Creatinine, Ser: 1.47 mg/dL — ABNORMAL HIGH (ref 0.61–1.24)
GFR, Estimated: >60 mL/min (ref >60)
Glucose, Bld: 136 mg/dL — ABNORMAL HIGH (ref 70–99)
Potassium: 3.8 mmol/L (ref 3.5–5.1)
Sodium: 141 mmol/L (ref 135–145)

## 2020-08-19 LAB — TROPONIN I (HIGH SENSITIVITY)
Troponin I (High Sensitivity): 29 ng/L — ABNORMAL HIGH (ref <18)
Troponin I (High Sensitivity): 21 ng/L — ABNORMAL HIGH (ref <18)
Troponin I (High Sensitivity): 26 ng/L — ABNORMAL HIGH (ref <18)

## 2020-08-19 LAB — CBC
HCT: 47.7 % (ref 39.0–52.0)
Hemoglobin: 14.8 g/dL (ref 13.0–17.0)
MCH: 28.1 pg (ref 26.0–34.0)
MCHC: 31 g/dL (ref 30.0–36.0)
MCV: 90.5 fL (ref 80.0–100.0)
Platelets: 237 10*3/uL (ref 150–400)
RBC: 5.27 MIL/uL (ref 4.22–5.81)
RDW: 14.4 % (ref 11.5–15.5)
WBC: 7.2 10*3/uL (ref 4.0–10.5)
nRBC: 0 % (ref 0.0–0.2)

## 2020-08-19 LAB — BRAIN NATRIURETIC PEPTIDE: B Natriuretic Peptide: 24.6 pg/mL (ref 0.0–100.0)

## 2020-08-19 NOTE — ED Notes (Signed)
Triage rn notified of blood pressure ?

## 2020-08-19 NOTE — ED Triage Notes (Signed)
Pt here from home with c/o sob and chf , pt placed on 2 liters for sats of 91 % pt not normally on O2 , pt was UC yesterday for the swelling to his  Legs

## 2020-08-20 ENCOUNTER — Ambulatory Visit (HOSPITAL_BASED_OUTPATIENT_CLINIC_OR_DEPARTMENT_OTHER)
Admission: RE | Admit: 2020-08-20 | Discharge: 2020-08-20 | Disposition: A | Discharge status: Home / Self Care | Payer: 59 | Source: Ambulatory Visit | Attending: Adult Health | Admitting: Adult Health

## 2020-08-20 ENCOUNTER — Other Ambulatory Visit: Payer: Self-pay

## 2020-08-20 ENCOUNTER — Other Ambulatory Visit (HOSPITAL_COMMUNITY): Payer: Self-pay

## 2020-08-20 ENCOUNTER — Encounter (HOSPITAL_COMMUNITY): Payer: Self-pay

## 2020-08-20 VITALS — BP 130/70 | HR 99 | Ht 66.0 in | Wt 271.6 lb

## 2020-08-20 DIAGNOSIS — I5022 Chronic systolic (congestive) heart failure: Secondary | ICD-10-CM

## 2020-08-20 DIAGNOSIS — G4733 Obstructive sleep apnea (adult) (pediatric): Secondary | ICD-10-CM

## 2020-08-20 DIAGNOSIS — E785 Hyperlipidemia, unspecified: Secondary | ICD-10-CM

## 2020-08-20 DIAGNOSIS — N1831 Chronic kidney disease, stage 3a: Secondary | ICD-10-CM

## 2020-08-20 DIAGNOSIS — I10 Essential (primary) hypertension: Secondary | ICD-10-CM

## 2020-08-20 LAB — TROPONIN I (HIGH SENSITIVITY): Troponin I (High Sensitivity): 24 ng/L — ABNORMAL HIGH (ref <18)

## 2020-08-20 MED ORDER — FUROSEMIDE 10 MG/ML IJ SOLN
80.0000 mg | Freq: Once | INTRAMUSCULAR | Status: AC
  Administered 2020-08-20: 80 mg via INTRAVENOUS

## 2020-08-20 MED ORDER — POTASSIUM CHLORIDE 20 MEQ PO PACK
80.0000 meq | PACK | Freq: Once | ORAL | Status: AC
  Administered 2020-08-20: 80 meq via ORAL

## 2020-08-20 MED ORDER — FUROSEMIDE 10 MG/ML IJ SOLN
40.0000 mg | Freq: Once | INTRAMUSCULAR | Status: AC
  Administered 2020-08-20: 40 mg via INTRAVENOUS
  Filled 2020-08-20: qty 4

## 2020-08-20 NOTE — ED Notes (Signed)
Pt d/c by MD & is given d/c instructions and follow up care, Pt is out of the ED ambulatory

## 2020-08-20 NOTE — Progress Notes (Signed)
Advanced Heart Failure Clinic Note   Referring Physician: PCP: Corwin Levins, MD PCP-Cardiologist: Kristeen Miss, MD  [facility]: Dr. Gala Romney   HPI: Steven Chase is a 34 y.o. male with chronic NICM(prior EF 15-20% in 07/2018),poorly controlledHTN, OSA, and obesity.  Admitted with CP and ADHF in setting of uncontrolled HTN/HTN crisis in 9/20.Prior to admit he had run out of a few of his HF medications. Diuresed with IV lasix + metolazone and later transitioned to torsemide 60 mg daily. Overall weight went down 13 pounds. HF meds adjusted with improvement in blood pressure. Entresto increased to 97-103 twice a day. Discharge weight was 242 pounds.   He has required recent dose adjustments to [facility]. Did not tolerate 97-103 and was changed to 49-51 at last clinic visit on 11/10/19. Echo was also repeated at that visit and EF was improved at 45-50%.  Digoxin was discontinued. Labs were also obtained and showed AKI w/ SCr at 6.0 and hyperkalemia w/ K at 7.5. He was sent to ED and admitted for further management. EKG was normal. This was in the setting of dehydration + frequent use of NSAIDs. His HF meds were held and he as given IVFs for hydration and hyperkalemia treated w/ insulin, dextrose, IV calcium gluconate, kayexelate + lokelma.  Hypokalemia resolved and AKI improved. SCr drifted down to 2.40 by day of d/c. K was 4.6.   His HF meds were resumed, except for spironolactone. Entresto was resumed but at low dose 24-26 bid. Also of note, he was found to have severe hypertriglyceridemia w/ TGs 1300. Direct LDL 77. He was treated w/ Crestor and Vascepa added, however currently waiting approval from insurance for Vascepa.   He was last seen in the HF clinic back in January. He has no showed or canceled the last 6 appointments.   Weight has been climbing since 07/12/20 when his weight was 256 pounds up to 280 pounds.   Yesterday he was seen in [facility] ED with volume overload. He was given IV lasix  and discharged to home.    Today he returns for HF follow up.Overall feeling ok. Complaining of leg edema.  SOB with exertion. + Orthopnea.  Appetite ok. Eating fast food and potaoe chips. Using CPAP at night. No fever or chills. Weight at home trending up to 280  pounds. Says he is taking all medications. 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 Followed by HF Paramedicine.   Echo 1/21 1. Left ventricular ejection fraction, by visual estimation, is 40 to 45%. The left ventricle has moderately decreased function. There is severely increased left ventricular hypertrophy. 2. Left ventricular diastolic parameters are consistent with Grade I diastolic dysfunction (impaired relaxation). 3. The left ventricle demonstrates regional wall motion abnormalities. 4. Poor acoustic windows limit study LVEF appears improved compared to report from September 2020. 5. Global right ventricle has moderately reduced systolic function.The right ventricular size is normal. No increase in right ventricular wall thickness. 6. Left atrial size was normal. 7. Right atrial size was normal. 8. The mitral valve is normal in structure. No evidence of mitral valve regurgitation. 9. The tricuspid valve is normal in structure. 10. The aortic valve is tricuspid. Aortic valve regurgitation is not visualized. Mild aortic valve sclerosis without stenosis. 11. The pulmonic valve was normal in structure. Pulmonic valve regurgitation is not visualized. 12. The inferior vena cava is normal in size with greater than 50% respiratory variability, suggesting right atrial pressure of 3 mmHg. 13. Poor acoustic windows limit study.  Past Medical History:  Diagnosis Date   CHF (congestive heart failure) (HCC)    Edema of both lower extremities    Gout    High cholesterol    Hypertension    Obesity    OSA (obstructive sleep apnea) 11/05/2014   02/2015 -severe -AHI 106/h     Current Outpatient Medications    Medication Sig Dispense Refill   allopurinol (ZYLOPRIM) 100 MG tablet Take 100 mg by mouth 2 (two) times daily.     carvedilol (COREG) 25 MG tablet Take 25 mg by mouth 2 (two) times daily with a meal.     colchicine 0.6 MG tablet Take 0.6 mg by mouth daily as needed.     fenofibrate 160 MG tablet Take 1 tablet (160 mg total) by mouth daily. 90 tablet 2   hydrALAZINE (APRESOLINE) 100 MG tablet Take 1 tablet (100 mg total) by mouth every 8 (eight) hours. 90 tablet 11   isosorbide mononitrate (IMDUR) 60 MG 24 hr tablet Take 1.5 tablets (90 mg total) by mouth daily. 45 tablet 3   omega-3 acid ethyl esters (LOVAZA) 1 g capsule Take 2 capsules (2 g total) by mouth 2 (two) times daily. 120 capsule 6   rosuvastatin (CRESTOR) 40 MG tablet Take 1 tablet (40 mg total) by mouth daily at 6 PM. 90 tablet 3   sacubitril-valsartan (ENTRESTO) 24-26 MG Take 1 tablet by mouth 2 (two) times daily. 180 tablet 3   spironolactone (ALDACTONE) 25 MG tablet Take 0.5 tablets (12.5 mg total) by mouth daily. 45 tablet 3   torsemide (DEMADEX) 20 MG tablet Take 1 tablet by mouth once daily 90 tablet 0   No current facility-administered medications for this encounter.    No Known Allergies    Social History   Socioeconomic History   Marital status: Single    Spouse name: Not on file   Number of children: 1   Years of education: 12   Highest education level: Not on file  Occupational History   Occupation: Unloader    Employer: [employer]  Tobacco Use   Smoking status: Never Smoker   Smokeless tobacco: Never Used  Building services engineer Use: Never used  Substance and Sexual Activity   Alcohol use: No   Drug use: No   Sexual activity: Not on file  Other Topics Concern   Not on file  Social History Narrative   Born and raised in [place], [place]. Currently live in a private residence with his mother. Fun: Play with his daughter.   Denies religious beliefs that would effect health  care.    Social Determinants of Health   Financial Resource Strain:    Difficulty of Paying Living Expenses: Not on file  Food Insecurity:    Worried About Programme researcher, broadcasting/film/video in the Last Year: Not on file   [employer] of Food in the Last Year: Not on file  Transportation Needs:    Lack of Transportation (Medical): Not on file   Lack of Transportation (Non-Medical): Not on file  Physical Activity:    Days of Exercise per Week: Not on file   Minutes of Exercise per Session: Not on file  Stress:    Feeling of Stress : Not on file  Social Connections:    Frequency of Communication with Friends and Family: Not on file   Frequency of Social Gatherings with Friends and Family: Not on file   Attends Religious Services: Not on file   Active  Member of Clubs or Organizations: Not on file   Attends Banker Meetings: Not on file   Marital Status: Not on file  Intimate Partner Violence:    Fear of Current or Ex-Partner: Not on file   Emotionally Abused: Not on file   Physically Abused: Not on file   Sexually Abused: Not on file      Family History  Problem Relation Age of Onset   Hypertension Mother    High Cholesterol Mother    Depression Mother    Anxiety disorder Mother    Sleep apnea Mother    Obesity Mother    Heart disease Maternal Grandfather    Hypertension Maternal Grandfather    Hypertension Sister    High Cholesterol Father    High blood pressure Father    Obesity Father     Vitals:   08/20/20 1047  BP: 130/70  Pulse: 99  SpO2: 92%  Weight: 123.2 kg (271 lb 9.6 oz)  Height: 5\' 6"  (1.676 m)   Wt Readings from Last 3 Encounters:  08/20/20 123.2 kg  08/19/20 126.1 kg  08/18/20 126.7 kg     PHYSICAL EXAM: General:  Appears fatigued. No resp difficulty HEENT: normal Neck: supple. JVP to jaw. Carotids 2+ bilat; no bruits. No lymphadenopathy or thryomegaly appreciated. Cor: PMI nondisplaced. Regular rate & rhythm. No  rubs, gallops or murmurs. Lungs: clear Abdomen: soft, nontender, distended. No hepatosplenomegaly. No bruits or masses. Good bowel sounds. Extremities: no cyanosis, clubbing, rash, R and LLE 2+ edema Neuro: alert & orientedx3, cranial nerves grossly intact. moves all 4 extremities w/o difficulty. Affect pleasant    ASSESSMENT & PLAN:  1.  CKD3a - H/O due to volume depletion and NSAIDs and had creatinine 6.  - Had BMET 08/19/20. Creatinine 1.47 - Stable.   2. Chronic systolic HF due to NICM (likely hypertensive) - EF previosuly 15-20% - echo 11/10/19 EF improved to 45% - NYHA III. Marked volume overload. Hold torsemide for the next 2 days. Give 80 mg IV lasix + 40 meq potassium in the clinic with good urine output noted.  Tomorrow he received 80 mg IV lasix + 40 meq potassium tomorrow by HF Paramedic. .  - Continue carvedilol 25 mg twice a day.   - Continue Entresto 24-26 bid  - Continue hydralazine  100 mg tid - Continue Imdur 60 mg daily.  - On Thursday he will need BMET.  - 3. HTN -Stable.   4. Microcytic anemia - Iron stores ok.  5. Severe hypertriglyceridemia - TGs 1,100-> 1,300>347  - Continue Crestor - Waiting on insurance approval for Vascepa. Unable to afford fenofibrate (per pt and paramedicine) - If develops signs of hyperviscosity (AMS, SOB, visual changes) can consider plasmapheresis   6.  OSA -Discussed using CPAP every night.  -  Using CPAP qhs.   Follow up in 2 days. Provided with a work note for 10/19 10/20 and 08/22/20.  Discussed medications changes with HF Paramedicine.    08/24/20, NP 08/20/20

## 2020-08-20 NOTE — Progress Notes (Signed)
CSW informed by [employer] that pt weight has been up and this is likely due largely to patient diet.  Pt had been on moms meals program previously and paramedic inquiring if pt can receive another meal delivery.  CSW sent referral to [facility] and patient identified as a candidate to receive 7 heart healthy meals per week for 4 weeks through [facility].  Completed referral sent in for review.  Anticipate patient will receive first shipment of food in 1-3 business days.  Burna Sis, LCSW Clinical Social Worker [facility] Desk#: [phone] Cell#: [phone]

## 2020-08-20 NOTE — Progress Notes (Signed)
Peripheral IV removed. Catheter intact. Patient tolerated well

## 2020-08-20 NOTE — Progress Notes (Signed)
Paramedicine Encounter   Patient ID: Steven Chase , male,   DOB: 05-06-1986,33 y.o.,  MRN: [id number]  Steven Chase was seen in the clinic today and reported feeling better following IV lasix in the ED over the weekend. Per Tonye Becket, NP he is to receive 80 mg of IV lasix tomorrow and 40 mEq K+ and hold torsemide then follow up back in clinic on Thursday. I spoke with Rosetta Posner, LCSW, and she is getting him referred back to [employer] to aid in his diet. I will follow up tomorrow morning.   [name], EMT 08/20/2020   ACTION: Next visit planned for tomorrow morning

## 2020-08-20 NOTE — Addendum Note (Signed)
Encounter addended by: Burna Sis, LCSW on: 08/20/2020 2:16 PM  Actions taken: Clinical Note Signed

## 2020-08-20 NOTE — ED Provider Notes (Signed)
[facility] EMERGENCY DEPARTMENT Provider Note   CSN: [id number] Arrival date & time: 08/19/20  8648     History No chief complaint on file.   Steven Chase is a 34 y.o. male.  HPI    34 year old male comes in a chief complaint of shortness of breath and leg swelling. Patient reports that he has had leg swelling for the last 2 days.  He woke up in the morning with the swelling. He has been having shortness of breath with exertion. He has noted weight gain - unsure how much weight. He denies DIB when laying flat or waking up with shortness of breath in the middle of the night.   Past Medical History:  Diagnosis Date  . CHF (congestive heart failure) (HCC)   . Edema of both lower extremities   . Gout   . High cholesterol   . Hypertension   . Obesity   . OSA (obstructive sleep apnea) 11/05/2014   02/2015 -severe -AHI 106/h     Patient Active Problem List   Diagnosis Date Noted  . Prediabetes 08/05/2020  . Elevated liver enzymes 02/06/2020  . CKD (chronic kidney disease) stage 3, GFR 30-59 ml/min (HCC) 02/06/2020  . Vitamin D deficiency 02/06/2020  . Dyslipidemia (high LDL; low HDL) 02/06/2020  . Hypertriglyceridemia 02/06/2020  . Morbid obesity (HCC) 02/05/2020  . Acute gout of left ankle 01/04/2020  . AKI (acute kidney injury) (HCC) 11/10/2019  . Acute exacerbation of CHF (congestive heart failure) (HCC) 07/15/2019  . Lipodystrophy 10/21/2018  . Chronic systolic heart failure (HCC) 02/17/2018  . Fatigue 02/17/2018  . Cardiomyopathy (HCC) 02/11/2018  . Hypertensive crisis 02/04/2018  . Chest pain 02/04/2018  . Allergic rhinitis 01/21/2018  . Patellar tendonitis of left knee 12/02/2017  . Preventative health care 12/10/2016  . Obesity 05/25/2016  . Lower leg edema 12/12/2014  . Essential hypertension 11/05/2014  . OSA (obstructive sleep apnea) 11/05/2014    Past Surgical History:  Procedure Laterality Date  . HERNIA REPAIR    . RIGHT/LEFT  HEART CATH AND CORONARY ANGIOGRAPHY N/A 02/07/2018   Procedure: RIGHT/LEFT HEART CATH AND CORONARY ANGIOGRAPHY;  Surgeon: Marykay Lex, MD;  Location: [facility] INVASIVE CV LAB;  Service: Cardiovascular;  Laterality: N/A;       Family History  Problem Relation Age of Onset  . Hypertension Mother   . High Cholesterol Mother   . Depression Mother   . Anxiety disorder Mother   . Sleep apnea Mother   . Obesity Mother   . Heart disease Maternal Grandfather   . Hypertension Maternal Grandfather   . Hypertension Sister   . High Cholesterol Father   . High blood pressure Father   . Obesity Father     Social History   Tobacco Use  . Smoking status: Never Smoker  . Smokeless tobacco: Never Used  Vaping Use  . Vaping Use: Never used  Substance Use Topics  . Alcohol use: No  . Drug use: No    Home Medications Prior to Admission medications   Medication Sig Start Date End Date Taking? Authorizing Provider  allopurinol (ZYLOPRIM) 100 MG tablet Take 100 mg by mouth 2 (two) times daily.    [provider]  carvedilol (COREG) 25 MG tablet Take 25 mg by mouth 2 (two) times daily with a meal.    [provider]  colchicine 0.6 MG tablet Take 0.6 mg by mouth daily as needed.    [provider]  fenofibrate 160  MG tablet Take 1 tablet (160 mg total) by mouth daily. 11/30/19   Corwin Levins, MD  hydrALAZINE (APRESOLINE) 100 MG tablet Take 1 tablet (100 mg total) by mouth every 8 (eight) hours. 01/12/20   Bensimhon, Bevelyn Buckles, MD  isosorbide mononitrate (IMDUR) 60 MG 24 hr tablet Take 1.5 tablets (90 mg total) by mouth daily. 06/14/20   [name] M, PA-C  omega-3 acid ethyl esters (LOVAZA) 1 g capsule Take 2 capsules (2 g total) by mouth 2 (two) times daily. 12/06/19   Bensimhon, Bevelyn Buckles, MD  rosuvastatin (CRESTOR) 40 MG tablet Take 1 tablet (40 mg total) by mouth daily at 6 PM. 02/20/20   Corwin Levins, MD  sacubitril-valsartan (ENTRESTO) 24-26 MG Take 1 tablet by  mouth 2 (two) times daily. 04/12/20   Allayne Butcher, PA-C  spironolactone (ALDACTONE) 25 MG tablet Take 0.5 tablets (12.5 mg total) by mouth daily. 06/14/20 09/12/20  [name] M, PA-C  torsemide (DEMADEX) 20 MG tablet Take 1 tablet by mouth once daily 07/26/20   Tonye Becket D, NP    Allergies    Patient has no known allergies.  Review of Systems   Review of Systems  Constitutional: Positive for activity change.  Respiratory: Positive for shortness of breath.   Cardiovascular: Negative for chest pain.  Gastrointestinal: Negative for nausea and vomiting.  All other systems reviewed and are negative.   Physical Exam Updated Vital Signs BP (!) 181/106   Pulse 95   Temp 98.9 F (37.2 C) (Oral)   Resp 20   Ht 5\' 6"  (1.676 m)   Wt 126.1 kg   SpO2 96%   BMI 44.87 kg/m   Physical Exam Vitals and nursing note reviewed.  Constitutional:      Appearance: He is well-developed.  HENT:     Head: Atraumatic.  Cardiovascular:     Rate and Rhythm: Normal rate.  Pulmonary:     Effort: Pulmonary effort is normal.     Breath sounds: Rales present.  Musculoskeletal:     Cervical back: Neck supple.     Right lower leg: Edema present.     Left lower leg: Edema present.     Comments: 2+ pitting edema bilaterally   Skin:    General: Skin is warm.  Neurological:     Mental Status: He is alert and oriented to person, place, and time.     ED Results / Procedures / Treatments   Labs (all labs ordered are listed, but only abnormal results are displayed) Labs Reviewed  BASIC METABOLIC PANEL - Abnormal; Notable for the following components:      Result Value   Glucose, Bld 136 (*)    Creatinine, Ser 1.47 (*)    All other components within normal limits  TROPONIN I (HIGH SENSITIVITY) - Abnormal; Notable for the following components:   Troponin I (High Sensitivity) 29 (*)    All other components within normal limits  TROPONIN I (HIGH SENSITIVITY) - Abnormal; Notable for the  following components:   Troponin I (High Sensitivity) 21 (*)    All other components within normal limits  TROPONIN I (HIGH SENSITIVITY) - Abnormal; Notable for the following components:   Troponin I (High Sensitivity) 26 (*)    All other components within normal limits  TROPONIN I (HIGH SENSITIVITY) - Abnormal; Notable for the following components:   Troponin I (High Sensitivity) 24 (*)    All other components within normal limits  CBC  BRAIN NATRIURETIC PEPTIDE  EKG EKG Interpretation  Date/Time:  Monday August 19 2020 09:50:28 EDT Ventricular Rate:  63 PR Interval:  162 QRS Duration: 110 QT Interval:  416 QTC Calculation: 425 R Axis:   55 Text Interpretation: Normal sinus rhythm with sinus arrhythmia Possible Anterior infarct , age undetermined ST & T wave abnormality, consider inferior ischemia Abnormal ECG Since last tracing Non-specific ST-t changes have occured Otherwise no significant change Confirmed by Mancel Bale [phone]) on 08/19/2020 9:29:51 PM   Radiology DG Chest 2 View  Result Date: 08/19/2020 CLINICAL DATA:  Shortness of breath. Decreased oxygen saturation. Lower extremity swelling. Mild chest pain. EXAM: CHEST - 2 VIEW COMPARISON:  Single-view of the chest 07/15/2019. FINDINGS: Cardiomegaly and mild appearing interstitial edema. No consolidative process, pneumothorax or pleural effusion. No acute or focal bony abnormality. IMPRESSION: Cardiomegaly and interstitial pulmonary edema. Electronically Signed   By: Drusilla Kanner M.D.   On: 08/19/2020 10:09    Procedures Procedures (including critical care time)  Medications Ordered in ED Medications  furosemide (LASIX) injection 40 mg (has no administration in time range)    ED Course  I have reviewed the triage vital signs and the nursing notes.  Pertinent labs & imaging results that were available during my care of the patient were reviewed by me and considered in my medical decision making (see chart  for details).    MDM Rules/Calculators/A&P                         Steven Chase was evaluated in Emergency Department on 08/20/2020 for the symptoms described in the history of present illness. He was evaluated in the context of the global COVID-19 pandemic, which necessitated consideration that the patient might be at risk for infection with the SARS-CoV-2 virus that causes COVID-19. Institutional protocols and algorithms that pertain to the evaluation of patients at risk for COVID-19 are in a state of rapid change based on information released by regulatory bodies including the CDC and federal and state organizations. These policies and algorithms were followed during the patient's care in the ED.    Pt comes in with cc of shortness of breath and leg swelling. CXR shoes interstitial edema. Mild bibasilar edema. BNP is normal - pt is obese, clinically he has CHF. Pt is vaccinated. Denies any n/v/f/c/boady aches.  Gave IM lasix.  No decompensation from pulm perspective.  E-mail sent to Cards team to see him.  Final Clinical Impression(s) / ED Diagnoses Final diagnoses:  Acute combined systolic and diastolic congestive heart failure [facility])    Rx / DC Orders ED Discharge Orders    None       Derwood Kaplan, MD 08/20/20 0126

## 2020-08-20 NOTE — Patient Instructions (Signed)
No medication changes today.  Paramedicine will come out to your home on 10/20 to administer IV Lasix and Potassium  DO NOT TAKE TORSEMIDE TODAY AND TOMORROW  Your physician recommends that you return on Thursday Oct. 21st at [facility]  If you have any questions or concerns before your next appointment please send [place] a message through [place] or call our office at [phone].    TO LEAVE A MESSAGE FOR THE NURSE SELECT OPTION 2, PLEASE LEAVE A MESSAGE INCLUDING: . YOUR NAME . DATE OF BIRTH . CALL BACK NUMBER . REASON FOR CALL**this is important as we prioritize the call backs  YOU WILL RECEIVE A CALL BACK THE SAME DAY AS LONG AS YOU CALL BEFORE 4:00 PM At the [facility], you and your health needs are our priority. As part of our continuing mission to provide you with exceptional heart care, we have created designated Provider Care Teams. These Care Teams include your primary Cardiologist (physician) and Advanced Practice Providers (APPs- Physician Assistants and Nurse Practitioners) who all work together to provide you with the care you need, when you need it.   You may see any of the following providers on your designated Care Team at your next follow up: Marland Kitchen Dr Arvilla Meres . Dr Marca Ancona . Tonye Becket, NP . [name], PA . Karle Plumber, PharmD   Please be sure to bring in all your medications bottles to every appointment.

## 2020-08-20 NOTE — Discharge Instructions (Addendum)
Your symptoms are consistent with heart failure exacerbation. We have messaged the Cardiology service to assist with a close follow up. Please continue the plan of double lasix for 3 days.  Return to the ER if the breathing gets worse.

## 2020-08-20 NOTE — Progress Notes (Signed)
Inserted peripheral IV into Right antecubital. Pt tolerated well. 80mg  IV lasix administered, pt tolerated well. Administered Potassium PO.

## 2020-08-21 ENCOUNTER — Other Ambulatory Visit (HOSPITAL_COMMUNITY): Payer: Self-pay | Admitting: Cardiology

## 2020-08-21 ENCOUNTER — Other Ambulatory Visit (HOSPITAL_COMMUNITY): Payer: Self-pay

## 2020-08-21 ENCOUNTER — Other Ambulatory Visit (HOSPITAL_COMMUNITY): Payer: Self-pay | Admitting: *Deleted

## 2020-08-21 MED ORDER — POTASSIUM CHLORIDE CRYS ER 20 MEQ PO TBCR: EXTENDED_RELEASE_TABLET | ORAL | 3 refills | Status: DC

## 2020-08-21 NOTE — Progress Notes (Signed)
[facility] Note   Referring Physician: PCP: Corwin Levins, MD PCP-Cardiologist: Kristeen Miss, MD  [facility]: Dr. Gala Romney   HPI: Steven Chase is a 34 y.o. male with chronic NICM(prior EF 15-20% in 07/2018),poorly controlledHTN, OSA, and obesity.  Admitted with CP and ADHF in setting of uncontrolled HTN/HTN crisis in 9/20.Prior to admit he had run out of a few of his HF medications. Diuresed with IV lasix + metolazone and later transitioned to torsemide 60 mg daily. Overall weight went down 13 pounds. HF meds adjusted with improvement in blood pressure. Entresto increased to 97-103 twice a day. Discharge weight was 242 pounds.   He has required recent dose adjustments to [facility]. Did not tolerate 97-103 and was changed to 49-51 at last clinic visit on 11/10/19. Echo was also repeated at that visit and EF was improved at 45-50%.  Digoxin was discontinued. Labs were also obtained and showed AKI w/ SCr at 6.0 and hyperkalemia w/ K at 7.5. He was sent to ED and admitted for further management. EKG was normal. This was in the setting of dehydration + frequent use of NSAIDs. His HF meds were held and he as given IVFs for hydration and hyperkalemia treated w/ insulin, dextrose, IV calcium gluconate, kayexelate + lokelma.  Hypokalemia resolved and AKI improved. SCr drifted down to 2.40 by day of d/c. K was 4.6.   His HF meds were resumed, except for spironolactone. Entresto was resumed but at low dose 24-26 bid. Also of note, he was found to have severe hypertriglyceridemia w/ TGs 1300. Direct LDL 77. He was treated w/ Crestor and Vascepa added, however currently waiting approval from insurance for Vascepa.   He was last seen in the HF clinic back in January. He has no showed or canceled the last 6 appointments.   Weight has been climbing since 07/12/20 when his weight was 256 pounds up to 280 pounds.   Yesterday he was seen in [facility] ED with volume overload. He was given IV lasix  and discharged to home.    Today he returns for HF follow up. He was given 80 mg IV lasix 10/18 and 08/20/20. Complaining of fatigue. SOB with exertion. Using CPAP at night.  Denies.PND/Orthopnea. Appetite ok. No fever or chills. Weight at home has gone down a couple of pounds. Taking all medications. Followed by HF Paramedicine.  Echo 1/21 1. Left ventricular ejection fraction, by visual estimation, is 40 to 45%. The left ventricle has moderately decreased function. There is severely increased left ventricular hypertrophy. 2. Left ventricular diastolic parameters are consistent with Grade I diastolic dysfunction (impaired relaxation). 3. The left ventricle demonstrates regional wall motion abnormalities. 4. Poor acoustic windows limit study LVEF appears improved compared to report from September 2020. 5. Global right ventricle has moderately reduced systolic function.The right ventricular size is normal. No increase in right ventricular wall thickness. 6. Left atrial size was normal. 7. Right atrial size was normal. 8. The mitral valve is normal in structure. No evidence of mitral valve regurgitation. 9. The tricuspid valve is normal in structure. 10. The aortic valve is tricuspid. Aortic valve regurgitation is not visualized. Mild aortic valve sclerosis without stenosis. 11. The pulmonic valve was normal in structure. Pulmonic valve regurgitation is not visualized. 12. The inferior vena cava is normal in size with greater than 50% respiratory variability, suggesting right atrial pressure of 3 mmHg. 13. Poor acoustic windows limit study.       Past Medical History:  Diagnosis Date  .  CHF (congestive heart failure) (HCC)   . Edema of both lower extremities   . Gout   . High cholesterol   . Hypertension   . Obesity   . OSA (obstructive sleep apnea) 11/05/2014   02/2015 -severe -AHI 106/h     Current Outpatient Medications  Medication Sig Dispense Refill  . allopurinol  (ZYLOPRIM) 100 MG tablet Take 100 mg by mouth 2 (two) times daily.    . carvedilol (COREG) 25 MG tablet TAKE 1 TABLET BY MOUTH TWICE DAILY WITH A MEAL 60 tablet 3  . fenofibrate 160 MG tablet Take 1 tablet (160 mg total) by mouth daily. 90 tablet 2  . hydrALAZINE (APRESOLINE) 100 MG tablet Take 1 tablet (100 mg total) by mouth every 8 (eight) hours. 90 tablet 11  . isosorbide mononitrate (IMDUR) 60 MG 24 hr tablet Take 1.5 tablets (90 mg total) by mouth daily. 45 tablet 3  . omega-3 acid ethyl esters (LOVAZA) 1 g capsule Take 2 capsules (2 g total) by mouth 2 (two) times daily. 120 capsule 6  . potassium chloride SA (KLOR-CON) 20 MEQ tablet Take with IV lasix. 60 tablet 3  . rosuvastatin (CRESTOR) 40 MG tablet Take 1 tablet (40 mg total) by mouth daily at 6 PM. 90 tablet 3  . sacubitril-valsartan (ENTRESTO) 24-26 MG Take 1 tablet by mouth 2 (two) times daily. 180 tablet 3  . spironolactone (ALDACTONE) 25 MG tablet Take 0.5 tablets (12.5 mg total) by mouth daily. 45 tablet 3  . torsemide (DEMADEX) 20 MG tablet Take 1 tablet by mouth once daily 90 tablet 0   No current facility-administered medications for this encounter.    No Known Allergies    Social History   Socioeconomic History  . Marital status: Single    Spouse name: Not on file  . Number of children: 1  . Years of education: 87  . Highest education level: Not on file  Occupational History  . 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 Smoking status: Never Smoker  . Smokeless tobacco: Never Used  Vaping Use  . Vaping Use: Never used  Substance and Sexual Activity  . Alcohol use: No  . Drug use: No  . Sexual activity: Not on file  Other Topics Concern  . Not on file  Social History Narrative   Born and raised in [place], [place]. Currently live in a private residence with his mother. Fun: Play with his daughter.   Denies religious beliefs that would effect health care.    Social  Determinants of Health   Financial Resource Strain:   . Difficulty of Paying Living Expenses: Not on file  Food Insecurity:   . Worried About Programme researcher, broadcasting/film/video in the Last Year: Not on file  . Ran Out of Food in the Last Year: Not on file  Transportation Needs:   . Lack of Transportation (Medical): Not on file  . Lack of Transportation (Non-Medical): Not on file  Physical Activity:   . Days of Exercise per Week: Not on file  . Minutes of Exercise per Session: Not on file  Stress:   . Feeling of Stress : Not on file  Social Connections:   . Frequency of Communication with Friends and Family: Not on file  . Frequency of Social Gatherings with Friends and Family: Not on file  . Attends Religious Services: Not on file  . Active Member of Clubs or Organizations: Not on file  .  Attends Banker Meetings: Not on file  . Marital Status: Not on file  Intimate Partner Violence:   . Fear of Current or Ex-Partner: Not on file  . Emotionally Abused: Not on file  . Physically Abused: Not on file  . Sexually Abused: Not on file      Family History  Problem Relation Age of Onset  . Hypertension Mother   . High Cholesterol Mother   . Depression Mother   . Anxiety disorder Mother   . Sleep apnea Mother   . Obesity Mother   . Heart disease Maternal Grandfather   . Hypertension Maternal Grandfather   . Hypertension Sister   . High Cholesterol Father   . High blood pressure Father   . Obesity Father     Vitals:   08/22/20 0840  BP: 131/86  Pulse: 86  SpO2: 92%  Weight: 122.4 kg (269 lb 12.8 oz)   Wt Readings from Last 3 Encounters:  08/22/20 122.4 kg (269 lb 12.8 oz)  08/21/20 122.9 kg (271 lb)  08/20/20 123.2 kg (271 lb 9.6 oz)     PHYSICAL EXAM: General:  No resp difficulty HEENT: normal Neck: supple. JVP difficult to assess but seems lower today 10-11 . Carotids 2+ bilat; no bruits. No lymphadenopathy or thryomegaly appreciated. Cor: PMI nondisplaced.  Regular rate & rhythm. No rubs, gallops or murmurs. Lungs: clear Abdomen: soft, nontender, nondistended. No hepatosplenomegaly. No bruits or masses. Good bowel sounds. Extremities: no cyanosis, clubbing, rash, R and LLE 1-2 +edema Neuro: alert & orientedx3, cranial nerves grossly intact. moves all 4 extremities w/o difficulty. Affect pleasant    ASSESSMENT & PLAN:  1.  CKD3a - H/O due to volume depletion and NSAIDs and had creatinine 6.  - Had BMET 08/19/20. Creatinine 1.47 - Check BMET now.   2. Chronic systolic HF due to NICM (likely hypertensive) - EF previosuly 15-20% - echo 11/10/19 EF improved to 45% - NYHA III. Volume with some improvement. Give another 80 mg IV lasix now + 40 meq potassium.  -Tomorrow he will increase home torsemide dose to 60 mg daily and start 40 meq potassium.  - Continue carvedilol 25 mg twice a day.   - Continue Entresto 24-26 bid  - Continue hydralazine  100 mg tid - Continue Imdur 60 mg daily.  - Set up ECHO.  - Considered Cardiomems but he has not been hospitalized.  3. HTN -Stable.    4. Microcytic anemia - Iron stores ok.  5. Severe hypertriglyceridemia - TGs 1,100-> 1,300>347  - Continue Crestor - Waiting on insurance approval for Vascepa. Unable to afford fenofibrate (per pt and paramedicine) - If develops signs of hyperviscosity (AMS, SOB, visual changes) can consider plasmapheresis   6.  OSA -Discussed using CPAP every night.  -  Using CPAP qhs.   7. Obesity Body mass index is 43.55 kg/m. Discussed portion control and low salt food choices.   Given 80 mg IV lasix + 40 meq of potassium. If no improvement with HF Paramedicine will set up RHC to assess hemodynamics.  Follow up in 2 weeks.   Tonye Becket, NP 08/22/20

## 2020-08-21 NOTE — Progress Notes (Signed)
Paramedicine Encounter    Patient ID: Steven Chase, male    DOB: Jul 24, 1986, 34 y.o.   MRN: [id number]   Patient Care Team: Corwin Levins, MD as PCP - General (Internal Medicine) Nahser, Deloris Ping, MD as PCP - Cardiology (Cardiology) Burna Sis, LCSW as Social Worker (Licensed Clinical Social Worker)  Patient Active Problem List   Diagnosis Date Noted  . Prediabetes 08/05/2020  . Elevated liver enzymes 02/06/2020  . CKD (chronic kidney disease) stage 3, GFR 30-59 ml/min (HCC) 02/06/2020  . Vitamin D deficiency 02/06/2020  . Dyslipidemia (high LDL; low HDL) 02/06/2020  . Hypertriglyceridemia 02/06/2020  . Morbid obesity (HCC) 02/05/2020  . Acute gout of left ankle 01/04/2020  . AKI (acute kidney injury) (HCC) 11/10/2019  . Acute exacerbation of CHF (congestive heart failure) (HCC) 07/15/2019  . Lipodystrophy 10/21/2018  . Chronic systolic heart failure (HCC) 02/17/2018  . Fatigue 02/17/2018  . Cardiomyopathy (HCC) 02/11/2018  . Hypertensive crisis 02/04/2018  . Chest pain 02/04/2018  . Allergic rhinitis 01/21/2018  . Patellar tendonitis of left knee 12/02/2017  . Preventative health care 12/10/2016  . Obesity 05/25/2016  . Lower leg edema 12/12/2014  . Essential hypertension 11/05/2014  . OSA (obstructive sleep apnea) 11/05/2014    Current Outpatient Medications:  .  allopurinol (ZYLOPRIM) 100 MG tablet, Take 100 mg by mouth 2 (two) times daily., Disp: , Rfl:  .  carvedilol (COREG) 25 MG tablet, TAKE 1 TABLET BY MOUTH TWICE DAILY WITH A MEAL, Disp: 60 tablet, Rfl: 3 .  fenofibrate 160 MG tablet, Take 1 tablet (160 mg total) by mouth daily., Disp: 90 tablet, Rfl: 2 .  hydrALAZINE (APRESOLINE) 100 MG tablet, Take 1 tablet (100 mg total) by mouth every 8 (eight) hours., Disp: 90 tablet, Rfl: 11 .  isosorbide mononitrate (IMDUR) 60 MG 24 hr tablet, Take 1.5 tablets (90 mg total) by mouth daily., Disp: 45 tablet, Rfl: 3 .  omega-3 acid ethyl esters (LOVAZA) 1 g capsule, Take  2 capsules (2 g total) by mouth 2 (two) times daily., Disp: 120 capsule, Rfl: 6 .  potassium chloride SA (KLOR-CON) 20 MEQ tablet, Take with IV lasix., Disp: 60 tablet, Rfl: 3 .  rosuvastatin (CRESTOR) 40 MG tablet, Take 1 tablet (40 mg total) by mouth daily at 6 PM., Disp: 90 tablet, Rfl: 3 .  sacubitril-valsartan (ENTRESTO) 24-26 MG, Take 1 tablet by mouth 2 (two) times daily., Disp: 180 tablet, Rfl: 3 .  spironolactone (ALDACTONE) 25 MG tablet, Take 0.5 tablets (12.5 mg total) by mouth daily., Disp: 45 tablet, Rfl: 3 .  torsemide (DEMADEX) 20 MG tablet, Take 1 tablet by mouth once daily, Disp: 90 tablet, Rfl: 0 .  colchicine 0.6 MG tablet, Take 0.6 mg by mouth daily as needed. (Patient not taking: Reported on 08/21/2020), Disp: , Rfl:  No Known Allergies    Social History   Socioeconomic History  . Marital status: Single    Spouse name: Not on file  . Number of children: 1  . Years of education: 46  . Highest education level: Not on file  Occupational History  . 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 Smoking status: Never Smoker  . Smokeless tobacco: Never Used  Vaping Use  . Vaping Use: Never used  Substance and Sexual Activity  . Alcohol use: No  . Drug use: No  . Sexual activity: Not on file  Other Topics Concern  . Not on file  Social History Narrative   Born and raised in [place], [place]. Currently live in a private residence with his mother. Fun: Play with his daughter.   Denies religious beliefs that would effect health care.    Social Determinants of Health   Financial Resource Strain:   . Difficulty of Paying Living Expenses: Not on file  Food Insecurity:   . Worried About Programme researcher, broadcasting/film/video in the Last Year: Not on file  . Ran Out of Food in the Last Year: Not on file  Transportation Needs:   . Lack of Transportation (Medical): Not on file  . Lack of Transportation (Non-Medical): Not on file  Physical Activity:   . Days  of Exercise per Week: Not on file  . Minutes of Exercise per Session: Not on file  Stress:   . Feeling of Stress : Not on file  Social Connections:   . Frequency of Communication with Friends and Family: Not on file  . Frequency of Social Gatherings with Friends and Family: Not on file  . Attends Religious Services: Not on file  . Active Member of Clubs or Organizations: Not on file  . Attends Banker Meetings: Not on file  . Marital Status: Not on file  Intimate Partner Violence:   . Fear of Current or Ex-Partner: Not on file  . Emotionally Abused: Not on file  . Physically Abused: Not on file  . Sexually Abused: Not on file    Physical Exam Cardiovascular:     Rate and Rhythm: Normal rate and regular rhythm.     Pulses: Normal pulses.  Pulmonary:     Effort: Pulmonary effort is normal.     Breath sounds: Normal breath sounds.  Musculoskeletal:        General: Normal range of motion.     Right lower leg: Edema present.     Left lower leg: Edema present.  Skin:    General: Skin is warm and dry.     Capillary Refill: Capillary refill takes less than 2 seconds.  Neurological:     Mental Status: He is alert and oriented to person, place, and time.  Psychiatric:        Mood and Affect: Mood normal.         Future Appointments  Date Time Provider Department Center  08/22/2020  9:00 AM [facility] PA/NP [facility] None  08/31/2020  9:30 AM [facility]  02/05/2021 11:00 AM Corwin Levins, MD [facility] None    BP 117/87 (BP Location: Left Arm, Patient Position: Sitting, Cuff Size: Large)   Pulse 99   Resp 16   Wt 271 lb (122.9 kg)   SpO2 93%   BMI 43.74 kg/m   Weight yesterday- 271 lb Last visit weight- n/a  Steven Chase was seen at home today at the request of the HF clinic for a dose of IV lasix. He was given 80 mg via a 20 gauge cannula in the LAC. The medication was flushed in with 20 ml of NSS and the catheter was immediately  removed. He has a follow up appointment in the clinic tomorrow morning where he will have labs. His medications were verified and his pillbox was refilled. He did not have potassium or carvedilol to put in his box so I went to the pharmacy and picked them up for him. Upon returning I gave him 40 mEq of potassium to take and added the carvedilol to his pillbox. I will follow up  tomorrow at the clinic.   [name], EMT 08/21/20  ACTION: Home visit completed Next visit planned for tomorrow at clinic

## 2020-08-22 ENCOUNTER — Other Ambulatory Visit (HOSPITAL_COMMUNITY): Payer: Self-pay

## 2020-08-22 ENCOUNTER — Other Ambulatory Visit: Payer: Self-pay

## 2020-08-22 ENCOUNTER — Ambulatory Visit (HOSPITAL_COMMUNITY)
Admission: RE | Admit: 2020-08-22 | Discharge: 2020-08-22 | Disposition: A | Discharge status: Home / Self Care | Payer: 59 | Source: Ambulatory Visit | Attending: Adult Health | Admitting: Adult Health

## 2020-08-22 VITALS — BP 131/86 | HR 86 | Wt 269.8 lb

## 2020-08-22 DIAGNOSIS — G4733 Obstructive sleep apnea (adult) (pediatric): Secondary | ICD-10-CM | POA: Diagnosis not present

## 2020-08-22 DIAGNOSIS — I428 Other cardiomyopathies: Secondary | ICD-10-CM | POA: Diagnosis not present

## 2020-08-22 DIAGNOSIS — D509 Iron deficiency anemia, unspecified: Secondary | ICD-10-CM | POA: Diagnosis not present

## 2020-08-22 DIAGNOSIS — Z8249 Family history of ischemic heart disease and other diseases of the circulatory system: Secondary | ICD-10-CM | POA: Diagnosis not present

## 2020-08-22 DIAGNOSIS — N1831 Chronic kidney disease, stage 3a: Secondary | ICD-10-CM | POA: Diagnosis not present

## 2020-08-22 DIAGNOSIS — E669 Obesity, unspecified: Secondary | ICD-10-CM | POA: Insufficient documentation

## 2020-08-22 DIAGNOSIS — E781 Pure hyperglyceridemia: Secondary | ICD-10-CM | POA: Diagnosis not present

## 2020-08-22 DIAGNOSIS — Z79899 Other long term (current) drug therapy: Secondary | ICD-10-CM | POA: Insufficient documentation

## 2020-08-22 DIAGNOSIS — I130 Hypertensive heart and chronic kidney disease with heart failure and stage 1 through stage 4 chronic kidney disease, or unspecified chronic kidney disease: Secondary | ICD-10-CM | POA: Diagnosis not present

## 2020-08-22 DIAGNOSIS — Z6841 Body Mass Index (BMI) 40.0 and over, adult: Secondary | ICD-10-CM | POA: Diagnosis not present

## 2020-08-22 DIAGNOSIS — I5022 Chronic systolic (congestive) heart failure: Secondary | ICD-10-CM | POA: Insufficient documentation

## 2020-08-22 LAB — BASIC METABOLIC PANEL
Anion gap: 11 (ref 5–15)
BUN: 23 mg/dL — ABNORMAL HIGH (ref 6–20)
CO2: 32 mmol/L (ref 22–32)
Calcium: 10 mg/dL (ref 8.9–10.3)
Chloride: 98 mmol/L (ref 98–111)
Creatinine, Ser: 1.96 mg/dL — ABNORMAL HIGH (ref 0.61–1.24)
GFR, Estimated: 45 mL/min — ABNORMAL LOW (ref >60)
Glucose, Bld: 129 mg/dL — ABNORMAL HIGH (ref 70–99)
Potassium: 3.9 mmol/L (ref 3.5–5.1)
Sodium: 141 mmol/L (ref 135–145)

## 2020-08-22 LAB — BRAIN NATRIURETIC PEPTIDE: B Natriuretic Peptide: 12.4 pg/mL (ref 0.0–100.0)

## 2020-08-22 MED ORDER — FUROSEMIDE 10 MG/ML IJ SOLN
80.0000 mg | Freq: Once | INTRAMUSCULAR | Status: AC
  Administered 2020-08-22: 80 mg via INTRAVENOUS

## 2020-08-22 MED ORDER — TORSEMIDE 20 MG PO TABS
60.0000 mg | ORAL_TABLET | Freq: Every day | ORAL | 3 refills | Status: DC
Start: 2020-08-22 — End: 2020-09-17

## 2020-08-22 MED ORDER — POTASSIUM CHLORIDE 20 MEQ PO PACK
40.0000 meq | PACK | Freq: Once | ORAL | Status: AC
  Administered 2020-08-22: 40 meq via ORAL

## 2020-08-22 MED ORDER — POTASSIUM CHLORIDE CRYS ER 20 MEQ PO TBCR
40.0000 meq | EXTENDED_RELEASE_TABLET | Freq: Every day | ORAL | 3 refills | Status: DC
Start: 2020-08-22 — End: 2020-09-12

## 2020-08-22 NOTE — Patient Instructions (Addendum)
TOMORROW Friday 10/22, INCREASE Torsemide 60mg  (3 tablets) Daily  START Potassium (2 tablets) Daily  Labs done today, your results will be available in MyChart, we will contact you for abnormal readings.  Your physician has requested that you have an echocardiogram. Echocardiography is a painless test that uses sound waves to create images of your heart. It provides your doctor with information about the size and shape of your heart and how well your heart's chambers and valves are working. This procedure takes approximately one hour. There are no restrictions for this procedure.  Your physician recommends that you follow up with our APP clinic next week  Your physician recommends that you return for a follow up appointment with Dr. in 8 weeks  If you have any questions or concerns before your next appointment please send Gala Romney a message through [facility] or call our office at [phone].    TO LEAVE A MESSAGE FOR THE NURSE SELECT OPTION 2, PLEASE LEAVE A MESSAGE INCLUDING: . YOUR NAME . DATE OF BIRTH . CALL BACK NUMBER . REASON FOR CALL**this is important as we prioritize the call backs  YOU WILL RECEIVE A CALL BACK THE SAME DAY AS LONG AS YOU CALL BEFORE 4:00 PM  At the [facility], you and your health needs are our priority. As part of our continuing mission to provide you with exceptional heart care, we have created designated Provider Care Teams. These Care Teams include your primary Cardiologist (physician) and Advanced Practice Providers (APPs- Physician Assistants and Nurse Practitioners) who all work together to provide you with the care you need, when you need it.   You may see any of the following providers on your designated Care Team at your next follow up: [phone] Dr Marland Kitchen . Dr Arvilla Meres . Marca Ancona, NP . Tonye Becket, PA . [name], PharmD   Please be sure to bring in all your medications bottles to every appointment.

## 2020-08-22 NOTE — Progress Notes (Signed)
IV removed at 1115. Catheter intact and patient tolerated well

## 2020-08-22 NOTE — Progress Notes (Signed)
Paramedicine Encounter   Patient ID: Steven Chase , male,   DOB: Oct 22, 1986,33 y.o.,  MRN: [id number]   Steven Chase was seen at the HF clinic today following IV lasix at home yesterday. He reported feeling no different from yesterday and he had only minimal weight loss. Per Tonye Becket, NP, he was to receive an additional 80 mg IV lasix while in clinic today and then 60 mg torsemide daily starting tomorrow morning. I will follow up at his home in the morning to see how he is doing.   [name], EMT 08/22/2020

## 2020-08-22 NOTE — Progress Notes (Signed)
Inserted peripheral IV into R antecubital @ 0930. Patient tolerated well. Administered IV lasix 80mg  and potassium PO. Patient tolerated well. Gave patient urinal for output.

## 2020-08-23 ENCOUNTER — Telehealth (HOSPITAL_COMMUNITY): Payer: Self-pay | Admitting: Licensed Clinical Social Worker

## 2020-08-23 ENCOUNTER — Other Ambulatory Visit (HOSPITAL_COMMUNITY): Payer: Self-pay

## 2020-08-23 MED ORDER — OMEGA-3-ACID ETHYL ESTERS 1 G PO CAPS: 2.0000 g | ORAL_CAPSULE | Freq: Two times a day (BID) | ORAL | 11 refills | Status: AC

## 2020-08-23 NOTE — Progress Notes (Signed)
Paramedicine Encounter    Patient ID: Steven Chase, male    DOB: [birth year], 34 y.o.   MRN: [id number]   Patient Care Team: Corwin Levins, MD as PCP - General (Internal Medicine) Nahser, Deloris Ping, MD as PCP - Cardiology (Cardiology) Burna Sis, LCSW as Social Worker (Licensed Clinical Social Worker)  Patient Active Problem List   Diagnosis Date Noted  . Prediabetes 08/05/2020  . Elevated liver enzymes 02/06/2020  . CKD (chronic kidney disease) stage 3, GFR 30-59 ml/min (HCC) 02/06/2020  . Vitamin D deficiency 02/06/2020  . Dyslipidemia (high LDL; low HDL) 02/06/2020  . Hypertriglyceridemia 02/06/2020  . Morbid obesity (HCC) 02/05/2020  . Acute gout of left ankle 01/04/2020  . AKI (acute kidney injury) (HCC) 11/10/2019  . Acute exacerbation of CHF (congestive heart failure) (HCC) 07/15/2019  . Lipodystrophy 10/21/2018  . Chronic systolic heart failure (HCC) 02/17/2018  . Fatigue 02/17/2018  . Cardiomyopathy (HCC) 02/11/2018  . Hypertensive crisis 02/04/2018  . Chest pain 02/04/2018  . Allergic rhinitis 01/21/2018  . Patellar tendonitis of left knee 12/02/2017  . Preventative health care 12/10/2016  . Obesity 05/25/2016  . Lower leg edema 12/12/2014  . Essential hypertension 11/05/2014  . OSA (obstructive sleep apnea) 11/05/2014    Current Outpatient Medications:  .  allopurinol (ZYLOPRIM) 100 MG tablet, Take 100 mg by mouth 2 (two) times daily., Disp: , Rfl:  .  carvedilol (COREG) 25 MG tablet, TAKE 1 TABLET BY MOUTH TWICE DAILY WITH A MEAL, Disp: 60 tablet, Rfl: 3 .  fenofibrate 160 MG tablet, Take 1 tablet (160 mg total) by mouth daily., Disp: 90 tablet, Rfl: 2 .  hydrALAZINE (APRESOLINE) 100 MG tablet, Take 1 tablet (100 mg total) by mouth every 8 (eight) hours., Disp: 90 tablet, Rfl: 11 .  isosorbide mononitrate (IMDUR) 60 MG 24 hr tablet, Take 1.5 tablets (90 mg total) by mouth daily., Disp: 45 tablet, Rfl: 3 .  omega-3 acid ethyl esters (LOVAZA) 1 g capsule, Take  2 capsules (2 g total) by mouth 2 (two) times daily., Disp: 120 capsule, Rfl: 6 .  potassium chloride SA (KLOR-CON) 20 MEQ tablet, Take 2 tablets (40 mEq total) by mouth daily. Take with IV lasix., Disp: 60 tablet, Rfl: 3 .  rosuvastatin (CRESTOR) 40 MG tablet, Take 1 tablet (40 mg total) by mouth daily at 6 PM., Disp: 90 tablet, Rfl: 3 .  sacubitril-valsartan (ENTRESTO) 24-26 MG, Take 1 tablet by mouth 2 (two) times daily., Disp: 180 tablet, Rfl: 3 .  spironolactone (ALDACTONE) 25 MG tablet, Take 0.5 tablets (12.5 mg total) by mouth daily., Disp: 45 tablet, Rfl: 3 .  torsemide (DEMADEX) 20 MG tablet, Take 3 tablets (60 mg total) by mouth daily., Disp: 90 tablet, Rfl: 3 No Known Allergies    Social History   Socioeconomic History  . Marital status: Single    Spouse name: Not on file  . Number of children: 1  . Years of education: 65  . Highest education level: Not on file  Occupational History  . 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 Smoking status: Never Smoker  . Smokeless tobacco: Never Used  Vaping Use  . Vaping Use: Never used  Substance and Sexual Activity  . Alcohol use: No  . Drug use: No  . Sexual activity: Not on file  Other Topics Concern  . Not on file  Social History Narrative   Born and raised in [place], [place]. Currently live  in a private residence with his mother. Fun: Play with his daughter.   Denies religious beliefs that would effect health care.    Social Determinants of Health   Financial Resource Strain:   . Difficulty of Paying Living Expenses: Not on file  Food Insecurity:   . Worried About Programme researcher, broadcasting/film/video in the Last Year: Not on file  . Ran Out of Food in the Last Year: Not on file  Transportation Needs:   . Lack of Transportation (Medical): Not on file  . Lack of Transportation (Non-Medical): Not on file  Physical Activity:   . Days of Exercise per Week: Not on file  . Minutes of Exercise per Session:  Not on file  Stress:   . Feeling of Stress : Not on file  Social Connections:   . Frequency of Communication with Friends and Family: Not on file  . Frequency of Social Gatherings with Friends and Family: Not on file  . Attends Religious Services: Not on file  . Active Member of Clubs or Organizations: Not on file  . Attends Banker Meetings: Not on file  . Marital Status: Not on file  Intimate Partner Violence:   . Fear of Current or Ex-Partner: Not on file  . Emotionally Abused: Not on file  . Physically Abused: Not on file  . Sexually Abused: Not on file    Physical Exam Cardiovascular:     Rate and Rhythm: Normal rate and regular rhythm.     Pulses: Normal pulses.  Pulmonary:     Effort: Pulmonary effort is normal.     Breath sounds: Normal breath sounds.  Musculoskeletal:        General: Normal range of motion.     Right lower leg: Edema present.     Left lower leg: Edema present.  Skin:    General: Skin is warm and dry.     Capillary Refill: Capillary refill takes less than 2 seconds.  Neurological:     Mental Status: He is alert and oriented to person, place, and time.  Psychiatric:        Mood and Affect: Mood normal.         Future Appointments  Date Time Provider Department Center  08/31/2020  9:30 AM [facility]  09/12/2020  8:00 AM [facility] 1 [facility]  09/12/2020  9:30 AM [facility] PA/NP [facility] None  10/21/2020 12:20 PM Bensimhon, Bevelyn Buckles, MD [facility] None  02/05/2021 11:00 AM Corwin Levins, MD [facility] None    BP 131/71 (BP Location: Left Arm, Patient Position: Sitting, Cuff Size: Normal)   Pulse 94   Resp 18   Wt 271 lb 3.2 oz (123 kg)   SpO2 90%   BMI 43.77 kg/m   Weight yesterday- 269 lb Last visit weight- n/a  Steven Chase was seen at home today and reported feeling generally well. Today was a follow up visit from his clinic appointment yesterday where he received 80 mg IV lasix. Today he began  taking 60 mg of torsemide and 40 mEq of potassium daily. His weight has increased slightly over the evening and his legs remain edematous. I contacted the HF clinic and no additional changes were advised at this time. I will follow up on Tuesday next week and deliver compression socks which the clinic ordered today. His medications were verified and the necessary changes were made to his pillbox. I will follow up next week.  [name], EMT 08/23/20  ACTION: Home visit completed Next visit planned for 1 week

## 2020-08-23 NOTE — Telephone Encounter (Signed)
CSW requested to assist with obtaining compression socks for patient. CSW ordered socks on [employer] and paramedic will delivery to patient's home next week. Lasandra Beech, LCSW, CCSW-MCS [phone]

## 2020-08-27 ENCOUNTER — Telehealth (HOSPITAL_COMMUNITY): Payer: Self-pay

## 2020-08-27 NOTE — Telephone Encounter (Signed)
I called [name] to schedule an appointment. He did not answer so I left a message requesting he call me back.   [name], EMT 08/27/20

## 2020-08-28 ENCOUNTER — Other Ambulatory Visit: Payer: Self-pay | Admitting: Orthopedic Surgery

## 2020-08-28 ENCOUNTER — Other Ambulatory Visit (HOSPITAL_COMMUNITY): Payer: Self-pay

## 2020-08-28 NOTE — Progress Notes (Signed)
Paramedicine Encounter    Patient ID: Steven Chase, male    DOB: [birth year], 34 y.o.   MRN: [id number]   Patient Care Team: Corwin Levins, MD as PCP - General (Internal Medicine) Nahser, Deloris Ping, MD as PCP - Cardiology (Cardiology) Burna Sis, LCSW as Social Worker (Licensed Clinical Social Worker)  Patient Active Problem List   Diagnosis Date Noted  . Prediabetes 08/05/2020  . Elevated liver enzymes 02/06/2020  . CKD (chronic kidney disease) stage 3, GFR 30-59 ml/min (HCC) 02/06/2020  . Vitamin D deficiency 02/06/2020  . Dyslipidemia (high LDL; low HDL) 02/06/2020  . Hypertriglyceridemia 02/06/2020  . Morbid obesity (HCC) 02/05/2020  . Acute gout of left ankle 01/04/2020  . AKI (acute kidney injury) (HCC) 11/10/2019  . Acute exacerbation of CHF (congestive heart failure) (HCC) 07/15/2019  . Lipodystrophy 10/21/2018  . Chronic systolic heart failure (HCC) 02/17/2018  . Fatigue 02/17/2018  . Cardiomyopathy (HCC) 02/11/2018  . Hypertensive crisis 02/04/2018  . Chest pain 02/04/2018  . Allergic rhinitis 01/21/2018  . Patellar tendonitis of left knee 12/02/2017  . Preventative health care 12/10/2016  . Obesity 05/25/2016  . Lower leg edema 12/12/2014  . Essential hypertension 11/05/2014  . OSA (obstructive sleep apnea) 11/05/2014    Current Outpatient Medications:  .  allopurinol (ZYLOPRIM) 100 MG tablet, Take 100 mg by mouth 2 (two) times daily., Disp: , Rfl:  .  carvedilol (COREG) 25 MG tablet, TAKE 1 TABLET BY MOUTH TWICE DAILY WITH A MEAL, Disp: 60 tablet, Rfl: 3 .  fenofibrate 160 MG tablet, Take 1 tablet (160 mg total) by mouth daily., Disp: 90 tablet, Rfl: 2 .  hydrALAZINE (APRESOLINE) 100 MG tablet, Take 1 tablet (100 mg total) by mouth every 8 (eight) hours., Disp: 90 tablet, Rfl: 11 .  isosorbide mononitrate (IMDUR) 60 MG 24 hr tablet, Take 1.5 tablets (90 mg total) by mouth daily., Disp: 45 tablet, Rfl: 3 .  omega-3 acid ethyl esters (LOVAZA) 1 g capsule, Take  2 capsules (2 g total) by mouth 2 (two) times daily., Disp: 120 capsule, Rfl: 11 .  potassium chloride SA (KLOR-CON) 20 MEQ tablet, Take 2 tablets (40 mEq total) by mouth daily. Take with IV lasix., Disp: 60 tablet, Rfl: 3 .  rosuvastatin (CRESTOR) 40 MG tablet, Take 1 tablet (40 mg total) by mouth daily at 6 PM., Disp: 90 tablet, Rfl: 3 .  sacubitril-valsartan (ENTRESTO) 24-26 MG, Take 1 tablet by mouth 2 (two) times daily., Disp: 180 tablet, Rfl: 3 .  spironolactone (ALDACTONE) 25 MG tablet, Take 0.5 tablets (12.5 mg total) by mouth daily., Disp: 45 tablet, Rfl: 3 .  torsemide (DEMADEX) 20 MG tablet, Take 3 tablets (60 mg total) by mouth daily., Disp: 90 tablet, Rfl: 3 No Known Allergies    Social History   Socioeconomic History  . Marital status: Single    Spouse name: Not on file  . Number of children: 1  . Years of education: 35  . Highest education level: Not on file  Occupational History  . 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 Smoking status: Never Smoker  . Smokeless tobacco: Never Used  Vaping Use  . Vaping Use: Never used  Substance and Sexual Activity  . Alcohol use: No  . Drug use: No  . Sexual activity: Not on file  Other Topics Concern  . Not on file  Social History Narrative   Born and raised in [place], [place]. Currently live  in a private residence with his mother. Fun: Play with his daughter.   Denies religious beliefs that would effect health care.    Social Determinants of Health   Financial Resource Strain:   . Difficulty of Paying Living Expenses: Not on file  Food Insecurity:   . Worried About Programme researcher, broadcasting/film/video in the Last Year: Not on file  . Ran Out of Food in the Last Year: Not on file  Transportation Needs:   . Lack of Transportation (Medical): Not on file  . Lack of Transportation (Non-Medical): Not on file  Physical Activity:   . Days of Exercise per Week: Not on file  . Minutes of Exercise per Session:  Not on file  Stress:   . Feeling of Stress : Not on file  Social Connections:   . Frequency of Communication with Friends and Family: Not on file  . Frequency of Social Gatherings with Friends and Family: Not on file  . Attends Religious Services: Not on file  . Active Member of Clubs or Organizations: Not on file  . Attends Banker Meetings: Not on file  . Marital Status: Not on file  Intimate Partner Violence:   . Fear of Current or Ex-Partner: Not on file  . Emotionally Abused: Not on file  . Physically Abused: Not on file  . Sexually Abused: Not on file    Physical Exam Cardiovascular:     Rate and Rhythm: Normal rate and regular rhythm.     Pulses: Normal pulses.  Pulmonary:     Effort: Pulmonary effort is normal.     Breath sounds: Normal breath sounds.  Musculoskeletal:        General: Normal range of motion.     Right lower leg: Edema present.     Left lower leg: Edema present.  Skin:    General: Skin is warm and dry.     Capillary Refill: Capillary refill takes less than 2 seconds.  Neurological:     Mental Status: He is alert and oriented to person, place, and time.  Psychiatric:        Mood and Affect: Mood normal.         Future Appointments  Date Time Provider Department Center  08/31/2020  9:30 AM [facility]  09/12/2020  8:00 AM [facility] OP 1 [facility]  09/12/2020  9:30 AM [facility] PA/NP [facility] None  10/21/2020 12:20 PM Bensimhon, Bevelyn Buckles, MD [facility] None  02/05/2021 11:00 AM Corwin Levins, MD [facility] None    BP 112/73 (BP Location: Left Arm, Patient Position: Sitting, Cuff Size: Large)   Pulse 90   Resp 16   Wt 268 lb (121.6 kg)   SpO2 91%   BMI 43.26 kg/m   Weight yesterday- did not weigh Last visit weight- 271 lb  Mr Farino was seen at home today and reported feeling well. He denied chest pain, SOB, headache, dizziness, orthopnea, fever or cough over the past week. He stated he has been  compliant with his medications over the past week and his weight has been stable. His medications were verified and his pillbox was refilled. He was given compression socks from the clinic today and was able to try them on and confirmed they fit well. I will follow up next week.   [name], EMT 08/28/20  ACTION: Home visit completed Next visit planned for 1 week

## 2020-08-31 ENCOUNTER — Ambulatory Visit: Payer: 59

## 2020-09-05 ENCOUNTER — Other Ambulatory Visit: Payer: Self-pay | Admitting: Orthopedic Surgery

## 2020-09-05 ENCOUNTER — Other Ambulatory Visit: Payer: Self-pay | Admitting: Internal Medicine

## 2020-09-05 ENCOUNTER — Other Ambulatory Visit (HOSPITAL_COMMUNITY): Payer: Self-pay | Admitting: Cardiology

## 2020-09-05 NOTE — Telephone Encounter (Signed)
Please refill as per office routine med refill policy (all routine meds refilled for 3 mo or monthly per pt preference up to one year from last visit, then month to month grace period for 3 mo, then further med refills will have to be denied)  

## 2020-09-06 ENCOUNTER — Telehealth (HOSPITAL_COMMUNITY): Payer: Self-pay | Admitting: *Deleted

## 2020-09-06 ENCOUNTER — Telehealth (HOSPITAL_COMMUNITY): Payer: Self-pay

## 2020-09-06 ENCOUNTER — Ambulatory Visit (HOSPITAL_COMMUNITY)
Admission: RE | Admit: 2020-09-06 | Discharge: 2020-09-06 | Disposition: A | Discharge status: Home / Self Care | Payer: 59 | Source: Ambulatory Visit | Attending: Internal Medicine | Admitting: Internal Medicine

## 2020-09-06 ENCOUNTER — Other Ambulatory Visit (HOSPITAL_COMMUNITY): Payer: Self-pay

## 2020-09-06 DIAGNOSIS — I5022 Chronic systolic (congestive) heart failure: Secondary | ICD-10-CM

## 2020-09-06 LAB — BASIC METABOLIC PANEL
Anion gap: 11 (ref 5–15)
BUN: 24 mg/dL — ABNORMAL HIGH (ref 6–20)
CO2: 32 mmol/L (ref 22–32)
Calcium: 9.3 mg/dL (ref 8.9–10.3)
Chloride: 99 mmol/L (ref 98–111)
Creatinine, Ser: 1.7 mg/dL — ABNORMAL HIGH (ref 0.61–1.24)
GFR, Estimated: 54 mL/min — ABNORMAL LOW (ref >60)
Glucose, Bld: 123 mg/dL — ABNORMAL HIGH (ref 70–99)
Potassium: 3.3 mmol/L — ABNORMAL LOW (ref 3.5–5.1)
Sodium: 142 mmol/L (ref 135–145)

## 2020-09-06 NOTE — Telephone Encounter (Signed)
I received a message from [name], PA-C, advising Mr Steven Chase's potassium was running low at 3.3 and he should increase to 60 mEq daily. I called Mr Woodfield and explained this to him and he was understanding an agreeable. I will follow up Tuesday.   [name], EMT 09/06/20

## 2020-09-06 NOTE — Progress Notes (Signed)
Paramedicine Encounter    Patient ID: Steven Chase, male    DOB: August 30, 1986, 34 y.o.   MRN: [id number]   Patient Care Team: Corwin Levins, MD as PCP - General (Internal Medicine) Nahser, Deloris Ping, MD as PCP - Cardiology (Cardiology) Burna Sis, LCSW as Social Worker (Licensed Clinical Social Worker)  Patient Active Problem List   Diagnosis Date Noted  . Prediabetes 08/05/2020  . Elevated liver enzymes 02/06/2020  . CKD (chronic kidney disease) stage 3, GFR 30-59 ml/min (HCC) 02/06/2020  . Vitamin D deficiency 02/06/2020  . Dyslipidemia (high LDL; low HDL) 02/06/2020  . Hypertriglyceridemia 02/06/2020  . Morbid obesity (HCC) 02/05/2020  . Acute gout of left ankle 01/04/2020  . AKI (acute kidney injury) (HCC) 11/10/2019  . Acute exacerbation of CHF (congestive heart failure) (HCC) 07/15/2019  . Lipodystrophy 10/21/2018  . Chronic systolic heart failure (HCC) 02/17/2018  . Fatigue 02/17/2018  . Cardiomyopathy (HCC) 02/11/2018  . Hypertensive crisis 02/04/2018  . Chest pain 02/04/2018  . Allergic rhinitis 01/21/2018  . Patellar tendonitis of left knee 12/02/2017  . Preventative health care 12/10/2016  . Obesity 05/25/2016  . Lower leg edema 12/12/2014  . Essential hypertension 11/05/2014  . OSA (obstructive sleep apnea) 11/05/2014    Current Outpatient Medications:  .  allopurinol (ZYLOPRIM) 100 MG tablet, Take 1 tablet by mouth twice daily, Disp: 60 tablet, Rfl: 0 .  carvedilol (COREG) 25 MG tablet, TAKE 1 TABLET BY MOUTH TWICE DAILY WITH A MEAL, Disp: 60 tablet, Rfl: 3 .  fenofibrate 160 MG tablet, Take 1 tablet by mouth once daily, Disp: 90 tablet, Rfl: 0 .  hydrALAZINE (APRESOLINE) 100 MG tablet, Take 1 tablet (100 mg total) by mouth every 8 (eight) hours., Disp: 90 tablet, Rfl: 11 .  isosorbide mononitrate (IMDUR) 60 MG 24 hr tablet, Take 1.5 tablets (90 mg total) by mouth daily., Disp: 45 tablet, Rfl: 3 .  omega-3 acid ethyl esters (LOVAZA) 1 g capsule, Take 2  capsules (2 g total) by mouth 2 (two) times daily., Disp: 120 capsule, Rfl: 11 .  potassium chloride SA (KLOR-CON) 20 MEQ tablet, Take 2 tablets (40 mEq total) by mouth daily. Take with IV lasix., Disp: 60 tablet, Rfl: 3 .  rosuvastatin (CRESTOR) 40 MG tablet, Take 1 tablet (40 mg total) by mouth daily at 6 PM., Disp: 90 tablet, Rfl: 3 .  sacubitril-valsartan (ENTRESTO) 24-26 MG, Take 1 tablet by mouth 2 (two) times daily., Disp: 180 tablet, Rfl: 3 .  spironolactone (ALDACTONE) 25 MG tablet, Take 1/2 (one-half) tablet by mouth once daily, Disp: 45 tablet, Rfl: 0 .  torsemide (DEMADEX) 20 MG tablet, Take 3 tablets (60 mg total) by mouth daily., Disp: 90 tablet, Rfl: 3 No Known Allergies    Social History   Socioeconomic History  . Marital status: Single    Spouse name: Not on file  . Number of children: 1  . Years of education: 30  . Highest education level: Not on file  Occupational History  . 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 Smoking status: Never Smoker  . Smokeless tobacco: Never Used  Vaping Use  . Vaping Use: Never used  Substance and Sexual Activity  . Alcohol use: No  . Drug use: No  . Sexual activity: Not on file  Other Topics Concern  . Not on file  Social History Narrative   Born and raised in [place], [place]. Currently live in a private residence  with his mother. Fun: Play with his daughter.   Denies religious beliefs that would effect health care.    Social Determinants of Health   Financial Resource Strain:   . Difficulty of Paying Living Expenses: Not on file  Food Insecurity:   . Worried About Programme researcher, broadcasting/film/video in the Last Year: Not on file  . Ran Out of Food in the Last Year: Not on file  Transportation Needs:   . Lack of Transportation (Medical): Not on file  . Lack of Transportation (Non-Medical): Not on file  Physical Activity:   . Days of Exercise per Week: Not on file  . Minutes of Exercise per Session: Not  on file  Stress:   . Feeling of Stress : Not on file  Social Connections:   . Frequency of Communication with Friends and Family: Not on file  . Frequency of Social Gatherings with Friends and Family: Not on file  . Attends Religious Services: Not on file  . Active Member of Clubs or Organizations: Not on file  . Attends Banker Meetings: Not on file  . Marital Status: Not on file  Intimate Partner Violence:   . Fear of Current or Ex-Partner: Not on file  . Emotionally Abused: Not on file  . Physically Abused: Not on file  . Sexually Abused: Not on file    Physical Exam Cardiovascular:     Rate and Rhythm: Normal rate and regular rhythm.     Pulses: Normal pulses.  Pulmonary:     Effort: Pulmonary effort is normal.     Breath sounds: Normal breath sounds.  Abdominal:     General: There is distension.  Musculoskeletal:        General: Normal range of motion.     Right lower leg: Edema present.     Left lower leg: Edema present.  Skin:    General: Skin is warm and dry.     Capillary Refill: Capillary refill takes less than 2 seconds.  Neurological:     Mental Status: He is alert and oriented to person, place, and time.  Psychiatric:        Mood and Affect: Mood normal.         Future Appointments  Date Time Provider Department Center  09/12/2020  8:00 AM [facility] ECHO OP 1 [facility]  09/12/2020  9:30 AM [facility] PA/NP [facility] None  10/21/2020 12:20 PM Bensimhon, Bevelyn Buckles, MD [facility] None  02/05/2021 11:00 AM Corwin Levins, MD [facility] None    BP (!) 148/87 (BP Location: Left Arm, Patient Position: Sitting, Cuff Size: Large)   Pulse 98   Resp 18   Wt 277 lb (125.6 kg)   SpO2 92%   BMI 44.71 kg/m   Weight yesterday- did not weigh Last visit weight- 268 lb  Steven Chase was seen at home today and reported feeling generally well. He denied chest pain, SOB, headache, dizziness, orthopnea, fever or cough over the past week. He reported being compliant with  his medications however his weight was up 9 lb since last Wednesday. I contacted the HF clinic and was advise to have him take an extra 40 mEq of potassium and administer 80 mg IV lasix. Additionally they requested I collect a blood sample for a BMP. Beginning tomorrow he will increase torsemide to 80 mg daily. The above orders were carried out and the adjustment was made to his pillbox. I will follow up next week.   [name],  EMT 09/06/20  ACTION: Home visit completed Next visit planned for 4 days

## 2020-09-06 NOTE — Telephone Encounter (Signed)
Timothy Lasso is out seeing pt and called to report pt's wt is up 9 lbs from last week, 268-->277, he has bilat LE edema and abd distension. Pt was given IV lasix a few days the week of 10/19.  Discussed all above w/[name] Sharol Harness, [place], she advises pt to get BMET today and 80 mg IV Lasix with extra 40 meq of KCL, starting tomorrow increase Torsemide to 80 mg Daily. IF not feeling better over weekend report to ER, paramedicine to see pt again on Mon 11/8 and pt should keep f/u as sch on 11/11.  Timothy Lasso is aware, agreeable and verbalizes understanding, he advised pt of plan and will admin lasix, pt will get bmet

## 2020-09-10 ENCOUNTER — Other Ambulatory Visit (HOSPITAL_COMMUNITY): Payer: Self-pay

## 2020-09-10 ENCOUNTER — Telehealth (HOSPITAL_COMMUNITY): Payer: Self-pay | Admitting: Licensed Clinical Social Worker

## 2020-09-10 NOTE — Telephone Encounter (Signed)
CSW informed by [employer] that pt needs help with transportation to [facility] appt on Thursday.  Has work until 7:30am that morning and appt at 8am- normally takes the bus but he would be late to his appt if he used public transport.  CSW able to set up ride through [facility] to bring to appt- pt aware   Will continue to follow and assist as needed  Burna Sis, LCSW Clinical Social Worker [facility] Desk#: [phone] Cell#: [phone]

## 2020-09-10 NOTE — Progress Notes (Signed)
Paramedicine Encounter    Patient ID: Steven Chase, male    DOB: August 18, 1986, 34 y.o.   MRN: [id number]   Patient Care Team: Corwin Levins, MD as PCP - General (Internal Medicine) Nahser, Deloris Ping, MD as PCP - Cardiology (Cardiology) Burna Sis, LCSW as Social Worker (Licensed Clinical Social Worker)  Patient Active Problem List   Diagnosis Date Noted  . Prediabetes 08/05/2020  . Elevated liver enzymes 02/06/2020  . CKD (chronic kidney disease) stage 3, GFR 30-59 ml/min (HCC) 02/06/2020  . Vitamin D deficiency 02/06/2020  . Dyslipidemia (high LDL; low HDL) 02/06/2020  . Hypertriglyceridemia 02/06/2020  . Morbid obesity (HCC) 02/05/2020  . Acute gout of left ankle 01/04/2020  . AKI (acute kidney injury) (HCC) 11/10/2019  . Acute exacerbation of CHF (congestive heart failure) (HCC) 07/15/2019  . Lipodystrophy 10/21/2018  . Chronic systolic heart failure (HCC) 02/17/2018  . Fatigue 02/17/2018  . Cardiomyopathy (HCC) 02/11/2018  . Hypertensive crisis 02/04/2018  . Chest pain 02/04/2018  . Allergic rhinitis 01/21/2018  . Patellar tendonitis of left knee 12/02/2017  . Preventative health care 12/10/2016  . Obesity 05/25/2016  . Lower leg edema 12/12/2014  . Essential hypertension 11/05/2014  . OSA (obstructive sleep apnea) 11/05/2014    Current Outpatient Medications:  .  allopurinol (ZYLOPRIM) 100 MG tablet, Take 1 tablet by mouth twice daily, Disp: 60 tablet, Rfl: 0 .  carvedilol (COREG) 25 MG tablet, TAKE 1 TABLET BY MOUTH TWICE DAILY WITH A MEAL, Disp: 60 tablet, Rfl: 3 .  fenofibrate 160 MG tablet, Take 1 tablet by mouth once daily, Disp: 90 tablet, Rfl: 0 .  hydrALAZINE (APRESOLINE) 100 MG tablet, Take 1 tablet (100 mg total) by mouth every 8 (eight) hours., Disp: 90 tablet, Rfl: 11 .  isosorbide mononitrate (IMDUR) 60 MG 24 hr tablet, Take 1.5 tablets (90 mg total) by mouth daily., Disp: 45 tablet, Rfl: 3 .  omega-3 acid ethyl esters (LOVAZA) 1 g capsule, Take 2  capsules (2 g total) by mouth 2 (two) times daily., Disp: 120 capsule, Rfl: 11 .  potassium chloride SA (KLOR-CON) 20 MEQ tablet, Take 2 tablets (40 mEq total) by mouth daily. Take with IV lasix., Disp: 60 tablet, Rfl: 3 .  rosuvastatin (CRESTOR) 40 MG tablet, Take 1 tablet (40 mg total) by mouth daily at 6 PM., Disp: 90 tablet, Rfl: 3 .  sacubitril-valsartan (ENTRESTO) 24-26 MG, Take 1 tablet by mouth 2 (two) times daily., Disp: 180 tablet, Rfl: 3 .  spironolactone (ALDACTONE) 25 MG tablet, Take 1/2 (one-half) tablet by mouth once daily, Disp: 45 tablet, Rfl: 0 .  torsemide (DEMADEX) 20 MG tablet, Take 3 tablets (60 mg total) by mouth daily., Disp: 90 tablet, Rfl: 3 No Known Allergies    Social History   Socioeconomic History  . Marital status: Single    Spouse name: Not on file  . Number of children: 1  . Years of education: 71  . Highest education level: Not on file  Occupational History  . 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 Smoking status: Never Smoker  . Smokeless tobacco: Never Used  Vaping Use  . Vaping Use: Never used  Substance and Sexual Activity  . Alcohol use: No  . Drug use: No  . Sexual activity: Not on file  Other Topics Concern  . Not on file  Social History Narrative   Born and raised in [place], [place]. Currently live in a private residence  with his mother. Fun: Play with his daughter.   Denies religious beliefs that would effect health care.    Social Determinants of Health   Financial Resource Strain:   . Difficulty of Paying Living Expenses: Not on file  Food Insecurity:   . Worried About Programme researcher, broadcasting/film/video in the Last Year: Not on file  . Ran Out of Food in the Last Year: Not on file  Transportation Needs:   . Lack of Transportation (Medical): Not on file  . Lack of Transportation (Non-Medical): Not on file  Physical Activity:   . Days of Exercise per Week: Not on file  . Minutes of Exercise per Session: Not  on file  Stress:   . Feeling of Stress : Not on file  Social Connections:   . Frequency of Communication with Friends and Family: Not on file  . Frequency of Social Gatherings with Friends and Family: Not on file  . Attends Religious Services: Not on file  . Active Member of Clubs or Organizations: Not on file  . Attends Banker Meetings: Not on file  . Marital Status: Not on file  Intimate Partner Violence:   . Fear of Current or Ex-Partner: Not on file  . Emotionally Abused: Not on file  . Physically Abused: Not on file  . Sexually Abused: Not on file    Physical Exam Cardiovascular:     Rate and Rhythm: Normal rate and regular rhythm.     Pulses: Normal pulses.  Pulmonary:     Effort: Pulmonary effort is normal.     Breath sounds: Normal breath sounds.  Musculoskeletal:        General: Normal range of motion.     Right lower leg: Edema present.     Left lower leg: Edema present.  Skin:    General: Skin is warm and dry.     Capillary Refill: Capillary refill takes less than 2 seconds.  Neurological:     Mental Status: He is alert and oriented to person, place, and time.  Psychiatric:        Mood and Affect: Mood normal.         Future Appointments  Date Time Provider Department Center  09/12/2020  8:00 AM [facility] ECHO OP 1 [facility]  09/12/2020  9:30 AM [facility] PA/NP [facility] None  10/21/2020 12:20 PM Bensimhon, Bevelyn Buckles, MD [facility] None  02/05/2021 11:00 AM Corwin Levins, MD [facility] None    BP (!) 147/93 (BP Location: Left Arm, Patient Position: Sitting, Cuff Size: Large)   Pulse 90   Resp 16   Wt 272 lb (123.4 kg)   SpO2 95%   BMI 43.90 kg/m   Weight yesterday- 274 lb Last visit weight- 277 lb  Mr Schalk was seen at home today and reported feeling well. He denied chest pain, SOB, headache, dizziness, orthopnea, fever or cough over the past week. He stated he has been compliant with his medications and his weight has come down after the IV  lasix last week. He has an appointment on Thursday at the HF clinic for an echo and follow up. I will see him there and finish filling his pillbox.   [name], EMT 09/10/20  ACTION: Home visit completed Next visit planned for Thursday

## 2020-09-11 ENCOUNTER — Telehealth: Payer: Self-pay | Admitting: Internal Medicine

## 2020-09-11 ENCOUNTER — Telehealth (HOSPITAL_COMMUNITY): Payer: Self-pay | Admitting: Licensed Clinical Social Worker

## 2020-09-11 NOTE — Telephone Encounter (Signed)
CSW informed by [employer] that pt is no longer going to work this evening so can have [facility] [employer] pick up from home.  CSW called [facility] [employer] and made adjustments to ride request- they are calling pt to confirm details.  Will continue to follow and assist as needed  Burna Sis, LCSW Clinical Social Worker [facility] Desk#: [phone] Cell#: [phone]

## 2020-09-11 NOTE — Telephone Encounter (Signed)
°   MUNIR VICTORIAN DOB: May 25, 1986 MRN: [id number]   RIDER WAIVER AND RELEASE OF LIABILITY  For purposes of improving physical access to our facilities, [facility] is pleased to partner with third parties to provide [facility] patients or other authorized individuals the option of convenient, on-demand ground transportation services (the [employer]) through use of the technology service that enables users to request on-demand ground transportation from independent third-party providers.  By opting to use and accept these [employer], I, the undersigned, hereby agree on behalf of myself, and on behalf of any minor child using the [employer] for whom I am the parent or legal guardian, as follows:  1. Science writer provided to me are provided by independent third-party transportation providers who are not [employer] or employees and who are unaffiliated with [employer]. 2. [facility] is neither a transportation carrier nor a common or public carrier. 3. [facility] has no control over the quality or safety of the transportation that occurs as a result of the [employer]. 4. [facility] cannot guarantee that any third-party transportation provider will complete any arranged transportation service. 5. [facility] makes no representation, warranty, or guarantee regarding the reliability, timeliness, quality, safety, suitability, or availability of any of the Transport Services or that they will be error free. 6. I fully understand that traveling by vehicle involves risks and dangers of serious bodily injury, including permanent disability, paralysis, and death. I agree, on behalf of myself and on behalf of any minor child using the Transport Services for whom I am the parent or legal guardian, that the entire risk arising out of my use of the [employer] remains solely with me, to the maximum extent permitted under applicable law. 7. The [employer] are provided as is and as available. [facility] disclaims all representations and warranties, express, implied or statutory, not expressly set out in these terms, including the implied warranties of merchantability and fitness for a particular purpose. 8. I hereby waive and release [facility], its agents, employees, officers, directors, representatives, insurers, attorneys, assigns, successors, subsidiaries, and affiliates from any and all past, present, or future claims, demands, liabilities, actions, causes of action, or suits of any kind directly or indirectly arising from acceptance and use of the [employer]. 9. I further waive and release [facility] and its affiliates from all present and future liability and responsibility for any injury or death to persons or damages to property caused by or related to the use of the [employer]. 10. I have read this Waiver and Release of Liability, and I understand the terms used in it and their legal significance. This Waiver is freely and voluntarily given with the understanding that my right (as well as the right of any minor child for whom I am the parent or legal guardian using the [employer]) to legal recourse against  in connection with the [employer] is knowingly surrendered in return for use of these services.   I attest that I read the consent document to [name], gave Mr. Asche the opportunity to ask questions and answered the questions asked (if any). I affirm that [name] then provided consent for he's participation in this program.     Launa Grill

## 2020-09-12 ENCOUNTER — Other Ambulatory Visit (HOSPITAL_COMMUNITY): Payer: Self-pay

## 2020-09-12 ENCOUNTER — Telehealth (HOSPITAL_COMMUNITY): Payer: Self-pay | Admitting: Licensed Clinical Social Worker

## 2020-09-12 ENCOUNTER — Encounter (HOSPITAL_COMMUNITY): Payer: Self-pay

## 2020-09-12 ENCOUNTER — Telehealth (HOSPITAL_COMMUNITY): Payer: Self-pay | Admitting: Cardiology

## 2020-09-12 ENCOUNTER — Ambulatory Visit (HOSPITAL_COMMUNITY)
Admission: RE | Admit: 2020-09-12 | Discharge: 2020-09-12 | Disposition: A | Discharge status: Home / Self Care | Payer: 59 | Source: Ambulatory Visit | Attending: Internal Medicine | Admitting: Internal Medicine

## 2020-09-12 ENCOUNTER — Other Ambulatory Visit: Payer: Self-pay

## 2020-09-12 ENCOUNTER — Ambulatory Visit (HOSPITAL_BASED_OUTPATIENT_CLINIC_OR_DEPARTMENT_OTHER)
Admission: RE | Admit: 2020-09-12 | Discharge: 2020-09-12 | Disposition: A | Discharge status: Home / Self Care | Payer: 59 | Source: Ambulatory Visit | Attending: Adult Health | Admitting: Adult Health

## 2020-09-12 VITALS — BP 108/80 | HR 78 | Wt 274.4 lb

## 2020-09-12 DIAGNOSIS — Z6841 Body Mass Index (BMI) 40.0 and over, adult: Secondary | ICD-10-CM | POA: Insufficient documentation

## 2020-09-12 DIAGNOSIS — I5022 Chronic systolic (congestive) heart failure: Secondary | ICD-10-CM

## 2020-09-12 DIAGNOSIS — E781 Pure hyperglyceridemia: Secondary | ICD-10-CM | POA: Insufficient documentation

## 2020-09-12 DIAGNOSIS — I130 Hypertensive heart and chronic kidney disease with heart failure and stage 1 through stage 4 chronic kidney disease, or unspecified chronic kidney disease: Secondary | ICD-10-CM | POA: Diagnosis not present

## 2020-09-12 DIAGNOSIS — Z7901 Long term (current) use of anticoagulants: Secondary | ICD-10-CM | POA: Insufficient documentation

## 2020-09-12 DIAGNOSIS — I428 Other cardiomyopathies: Secondary | ICD-10-CM | POA: Diagnosis not present

## 2020-09-12 DIAGNOSIS — Z79899 Other long term (current) drug therapy: Secondary | ICD-10-CM | POA: Diagnosis not present

## 2020-09-12 DIAGNOSIS — G4733 Obstructive sleep apnea (adult) (pediatric): Secondary | ICD-10-CM | POA: Diagnosis not present

## 2020-09-12 DIAGNOSIS — Z8249 Family history of ischemic heart disease and other diseases of the circulatory system: Secondary | ICD-10-CM | POA: Diagnosis not present

## 2020-09-12 DIAGNOSIS — N1831 Chronic kidney disease, stage 3a: Secondary | ICD-10-CM | POA: Diagnosis not present

## 2020-09-12 DIAGNOSIS — I5042 Chronic combined systolic (congestive) and diastolic (congestive) heart failure: Secondary | ICD-10-CM | POA: Diagnosis present

## 2020-09-12 LAB — BASIC METABOLIC PANEL
Anion gap: 10 (ref 5–15)
BUN: 26 mg/dL — ABNORMAL HIGH (ref 6–20)
CO2: 30 mmol/L (ref 22–32)
Calcium: 9.9 mg/dL (ref 8.9–10.3)
Chloride: 99 mmol/L (ref 98–111)
Creatinine, Ser: 1.94 mg/dL — ABNORMAL HIGH (ref 0.61–1.24)
GFR, Estimated: 46 mL/min — ABNORMAL LOW (ref >60)
Glucose, Bld: 122 mg/dL — ABNORMAL HIGH (ref 70–99)
Potassium: 3.3 mmol/L — ABNORMAL LOW (ref 3.5–5.1)
Sodium: 139 mmol/L (ref 135–145)

## 2020-09-12 LAB — HEMOGLOBIN A1C
Hgb A1c MFr Bld: 6.6 % — ABNORMAL HIGH (ref 4.8–5.6)
Mean Plasma Glucose: 142.72 mg/dL

## 2020-09-12 LAB — ECHOCARDIOGRAM COMPLETE
Area-P 1/2: 2.17 cm2
Calc EF: 66 %
S' Lateral: 2.1 cm
Single Plane A2C EF: 64.6 %
Single Plane A4C EF: 62.9 %

## 2020-09-12 LAB — TSH: TSH: 2.563 u[IU]/mL (ref 0.350–4.500)

## 2020-09-12 LAB — BRAIN NATRIURETIC PEPTIDE: B Natriuretic Peptide: 27 pg/mL (ref 0.0–100.0)

## 2020-09-12 MED ORDER — POTASSIUM CHLORIDE CRYS ER 20 MEQ PO TBCR: 40.0000 meq | EXTENDED_RELEASE_TABLET | Freq: Every day | ORAL | 3 refills | Status: DC

## 2020-09-12 MED ORDER — POTASSIUM CHLORIDE CRYS ER 20 MEQ PO TBCR
60.0000 meq | EXTENDED_RELEASE_TABLET | Freq: Every day | ORAL | 3 refills | Status: DC
Start: 2020-09-12 — End: 2020-09-17

## 2020-09-12 NOTE — H&P (View-Only) (Signed)
Advanced Heart Failure Clinic Note   Referring Physician: PCP: Corwin Levins, MD PCP-Cardiologist: Kristeen Miss, MD  [facility]: Dr. Gala Romney   HPI: Steven Chase is a 34 y.o. male with chronic NICM(prior EF 15-20% in 07/2018),poorly controlledHTN, OSA, and obesity.  Admitted with CP and ADHF in setting of uncontrolled HTN/HTN crisis in 9/20.Prior to admit he had run out of a few of his HF medications. Diuresed with IV lasix + metolazone and later transitioned to torsemide 60 mg daily. Overall weight went down 13 pounds. HF meds adjusted with improvement in blood pressure. Entresto increased to 97-103 twice a day. Discharge weight was 242 pounds.   He has required recent dose adjustments to [facility]. Did not tolerate 97-103 and was changed to 49-51 at last clinic visit on 11/10/19. Echo was also repeated at that visit and EF was improved at 45-50%.  Digoxin was discontinued. Labs were also obtained and showed AKI w/ SCr at 6.0 and hyperkalemia w/ K at 7.5. He was sent to ED and admitted for further management. EKG was normal. This was in the setting of dehydration + frequent use of NSAIDs. His HF meds were held and he as given IVFs for hydration and hyperkalemia treated w/ insulin, dextrose, IV calcium gluconate, kayexelate + lokelma.  Hypokalemia resolved and AKI improved. SCr drifted down to 2.40 by day of d/c. K was 4.6.   His HF meds were resumed, except for spironolactone. Entresto was resumed but at low dose 24-26 bid. Also of note, he was found to have severe hypertriglyceridemia w/ TGs 1300. Direct LDL 77. He was treated w/ Crestor and Vascepa added, however currently waiting approval from insurance for Vascepa.   He was last seen in the HF clinic back in January. He has no showed or canceled the last 6 appointments.   Weight has been climbing since 07/12/20 when his weight was 256 pounds up to 280 pounds. He was seen in the ED on 10/19 for SOB and LEE and found to be fluid overloaded.  Given IV Lasix and discharged home.   Had post ED f/u in [facility] on 10/21. Still SOB w/ ongoing fluid overload. He was given additional IV Lasix in clinic, 80 mg x 1, and instructed to further increase his home torsemide to 60 mg daily. He has been followed closely by paramedicine and was given another dose of IV Lasix at home on 11/5.   He presents back to clinic for f/u. LEE much improved, per pt and EMT report.  He is wearing compression stockings. Breathing has improved, but still NYHA Class II-III.  Volume assessment is difficult on exam due to body habitus. Interestingly, his BNPs recently, when checked at recent ED visit and clinic visits have been normal, 24 and 12, respectively (may be falsely low in setting of obesity). He is still > 10 lb above his prior dry wt. He reports full med compliance and good UOP w/ PO torsemide. BP is soft but stable, low 100s systolic. No orthostatic symptoms. We were unable to get ReDs Clip reading today (low quality, may be due to body habitus).   Echo repeated today and LVEF improved, now 60-65% w/ severe LVH. RV mild-mod HK  (personally reviewed by Dr. Gala Romney).    Review of systems complete and found to be negative unless listed in HPI.     Past Medical History:  Diagnosis Date  . CHF (congestive heart failure) (HCC)   . Edema of both lower extremities   . Gout   .  High cholesterol   . Hypertension   . Obesity   . OSA (obstructive sleep apnea) 11/05/2014   02/2015 -severe -AHI 106/h     Current Outpatient Medications  Medication Sig Dispense Refill  . allopurinol (ZYLOPRIM) 100 MG tablet Take 1 tablet by mouth twice daily 60 tablet 0  . carvedilol (COREG) 25 MG tablet TAKE 1 TABLET BY MOUTH TWICE DAILY WITH A MEAL 60 tablet 3  . colchicine 0.6 MG tablet Take 0.6 mg by mouth daily.    . fenofibrate 160 MG tablet Take 1 tablet by mouth once daily 90 tablet 0  . hydrALAZINE (APRESOLINE) 100 MG tablet Take 1 tablet (100 mg total) by mouth every 8  (eight) hours. 90 tablet 11  . isosorbide mononitrate (IMDUR) 60 MG 24 hr tablet Take 1.5 tablets (90 mg total) by mouth daily. 45 tablet 3  . omega-3 acid ethyl esters (LOVAZA) 1 g capsule Take 2 capsules (2 g total) by mouth 2 (two) times daily. 120 capsule 11  . potassium chloride SA (KLOR-CON) 20 MEQ tablet Take 2 tablets (40 mEq total) by mouth daily. Take with IV lasix. 60 tablet 3  . rosuvastatin (CRESTOR) 40 MG tablet Take 1 tablet (40 mg total) by mouth daily at 6 PM. 90 tablet 3  . sacubitril-valsartan (ENTRESTO) 24-26 MG Take 1 tablet by mouth 2 (two) times daily. 180 tablet 3  . spironolactone (ALDACTONE) 25 MG tablet Take 1/2 (one-half) tablet by mouth once daily 45 tablet 0  . torsemide (DEMADEX) 20 MG tablet Take 3 tablets (60 mg total) by mouth daily. 90 tablet 3   No current facility-administered medications for this encounter.    No Known Allergies    Social History   Socioeconomic History  . Marital status: Single    Spouse name: Not on file  . Number of children: 1  . Years of education: 60  . Highest education level: Not on file  Occupational History  . 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 Smoking status: Never Smoker  . Smokeless tobacco: Never Used  Vaping Use  . Vaping Use: Never used  Substance and Sexual Activity  . Alcohol use: No  . Drug use: No  . Sexual activity: Not on file  Other Topics Concern  . Not on file  Social History Narrative   Born and raised in [place], [place]. Currently live in a private residence with his mother. Fun: Play with his daughter.   Denies religious beliefs that would effect health care.    Social Determinants of Health   Financial Resource Strain:   . Difficulty of Paying Living Expenses: Not on file  Food Insecurity:   . Worried About Programme researcher, broadcasting/film/video in the Last Year: Not on file  . Ran Out of Food in the Last Year: Not on file  Transportation Needs:   . Lack of  Transportation (Medical): Not on file  . Lack of Transportation (Non-Medical): Not on file  Physical Activity:   . Days of Exercise per Week: Not on file  . Minutes of Exercise per Session: Not on file  Stress:   . Feeling of Stress : Not on file  Social Connections:   . Frequency of Communication with Friends and Family: Not on file  . Frequency of Social Gatherings with Friends and Family: Not on file  . Attends Religious Services: Not on file  . Active Member of Clubs or Organizations: Not on file  .  Attends Banker Meetings: Not on file  . Marital Status: Not on file  Intimate Partner Violence:   . Fear of Current or Ex-Partner: Not on file  . Emotionally Abused: Not on file  . Physically Abused: Not on file  . Sexually Abused: Not on file      Family History  Problem Relation Age of Onset  . Hypertension Mother   . High Cholesterol Mother   . Depression Mother   . Anxiety disorder Mother   . Sleep apnea Mother   . Obesity Mother   . Heart disease Maternal Grandfather   . Hypertension Maternal Grandfather   . Hypertension Sister   . High Cholesterol Father   . High blood pressure Father   . Obesity Father     Vitals:   09/12/20 0904  BP: 108/80  Pulse: 78  SpO2: 92%  Weight: 124.5 kg (274 lb 6.4 oz)   Wt Readings from Last 3 Encounters:  09/12/20 124.5 kg (274 lb 6.4 oz)  09/10/20 123.4 kg (272 lb)  09/06/20 125.6 kg (277 lb)    PHYSICAL EXAM: General:  Young, moderately obese AAM. No respiratory difficulty HEENT: normal Neck: supple. Thick neck, JVD assessment difficult. Carotids 2+ bilat; no bruits. No lymphadenopathy or thyromegaly appreciated. Cor: PMI nondisplaced. Regular rate & rhythm. No rubs, gallops or murmurs. Lungs: clear Abdomen: obese and distended, nontender. No hepatosplenomegaly. No bruits or masses. Good bowel sounds. Extremities: no cyanosis, clubbing, rash, no edema Neuro: alert & oriented x 3, cranial nerves grossly  intact. moves all 4 extremities w/o difficulty. Flat affect    ASSESSMENT & PLAN:  1. Chronic Combined Systolic and Diastolic Heart Failure (NICM>> Likely Hypertensive).   - EF previosuly 15-20% - Echo 11/10/19 EF improved to 45% - Echo today w/ normalized EF, 60-66%, RV mild-mod HK. Severe LVH (likey hypertensive CM).  - Chronically NYHA II-III. Recent issues w/ ongoing fluid overload, recent ED evaluation and need for IV diuretics. Volume assessment challenging due to body habitus. Unable to get quality reading on ReDs Clip today. Wt still above previous dry wt but ? How much of this is CHF. Recent BNPs have been normal, but may also be falsely low in the setting of obesity.  - will refer for RHC for further assessment of fluid status to help guide diuretics  - for now, continue torsemide 60 mg daily  - continue Coreg 25 mg daily  - continue Entresto 25-26 mg bid - continue Spiro 12.5 mg daily  - continue hydralazine 100 mg bid - check BMP today - continue w/ paramedicine. Appreciate their assistance - we previously recommended cardiomems but pt declined   2.  EHU3J - check BMP today    3. HTN - well controlled - continue current regimen - check BMP today   4. OSA - continue CPAP   5. Severe hypertriglyceridemia - TGs 1,100-> 1,300>347  - Continue Crestor - Waiting on insurance approval for Vascepa. Unable to afford fenofibrate (per pt and paramedicine) - If develops signs of hyperviscosity (AMS, SOB, visual changes) can consider plasmapheresis   6. Obesity -Body mass index is 44.29 kg/m.  - wt loss advised   F/u in 2 weeks, post RHC   Danford Tat [place], PA-C 09/12/20

## 2020-09-12 NOTE — Progress Notes (Signed)
Advanced Heart Failure Clinic Note   Referring Physician: PCP: Corwin Levins, MD PCP-Cardiologist: Kristeen Miss, MD  [facility]: Dr. Gala Romney   HPI: Steven Chase is a 34 y.o. male with chronic NICM(prior EF 15-20% in 07/2018),poorly controlledHTN, OSA, and obesity.  Admitted with CP and ADHF in setting of uncontrolled HTN/HTN crisis in 9/20.Prior to admit he had run out of a few of his HF medications. Diuresed with IV lasix + metolazone and later transitioned to torsemide 60 mg daily. Overall weight went down 13 pounds. HF meds adjusted with improvement in blood pressure. Entresto increased to 97-103 twice a day. Discharge weight was 242 pounds.   He has required recent dose adjustments to [facility]. Did not tolerate 97-103 and was changed to 49-51 at last clinic visit on 11/10/19. Echo was also repeated at that visit and EF was improved at 45-50%.  Digoxin was discontinued. Labs were also obtained and showed AKI w/ SCr at 6.0 and hyperkalemia w/ K at 7.5. He was sent to ED and admitted for further management. EKG was normal. This was in the setting of dehydration + frequent use of NSAIDs. His HF meds were held and he as given IVFs for hydration and hyperkalemia treated w/ insulin, dextrose, IV calcium gluconate, kayexelate + lokelma.  Hypokalemia resolved and AKI improved. SCr drifted down to 2.40 by day of d/c. K was 4.6.   His HF meds were resumed, except for spironolactone. Entresto was resumed but at low dose 24-26 bid. Also of note, he was found to have severe hypertriglyceridemia w/ TGs 1300. Direct LDL 77. He was treated w/ Crestor and Vascepa added, however currently waiting approval from insurance for Vascepa.   He was last seen in the HF clinic back in January. He has no showed or canceled the last 6 appointments.   Weight has been climbing since 07/12/20 when his weight was 256 pounds up to 280 pounds. He was seen in the ED on 10/19 for SOB and LEE and found to be fluid overloaded.  Given IV Lasix and discharged home.   Had post ED f/u in [facility] on 10/21. Still SOB w/ ongoing fluid overload. He was given additional IV Lasix in clinic, 80 mg x 1, and instructed to further increase his home torsemide to 60 mg daily. He has been followed closely by paramedicine and was given another dose of IV Lasix at home on 11/5.   He presents back to clinic for f/u. LEE much improved, per pt and EMT report.  He is wearing compression stockings. Breathing has improved, but still NYHA Class II-III.  Volume assessment is difficult on exam due to body habitus. Interestingly, his BNPs recently, when checked at recent ED visit and clinic visits have been normal, 24 and 12, respectively (may be falsely low in setting of obesity). He is still > 10 lb above his prior dry wt. He reports full med compliance and good UOP w/ PO torsemide. BP is soft but stable, low 100s systolic. No orthostatic symptoms. We were unable to get ReDs Clip reading today (low quality, may be due to body habitus).   Echo repeated today and LVEF improved, now 60-65% w/ severe LVH. RV mild-mod HK  (personally reviewed by Dr. Gala Romney).    Review of systems complete and found to be negative unless listed in HPI.     Past Medical History:  Diagnosis Date  . CHF (congestive heart failure) (HCC)   . Edema of both lower extremities   . Gout   .  High cholesterol   . Hypertension   . Obesity   . OSA (obstructive sleep apnea) 11/05/2014   02/2015 -severe -AHI 106/h     Current Outpatient Medications  Medication Sig Dispense Refill  . allopurinol (ZYLOPRIM) 100 MG tablet Take 1 tablet by mouth twice daily 60 tablet 0  . carvedilol (COREG) 25 MG tablet TAKE 1 TABLET BY MOUTH TWICE DAILY WITH A MEAL 60 tablet 3  . colchicine 0.6 MG tablet Take 0.6 mg by mouth daily.    . fenofibrate 160 MG tablet Take 1 tablet by mouth once daily 90 tablet 0  . hydrALAZINE (APRESOLINE) 100 MG tablet Take 1 tablet (100 mg total) by mouth every 8  (eight) hours. 90 tablet 11  . isosorbide mononitrate (IMDUR) 60 MG 24 hr tablet Take 1.5 tablets (90 mg total) by mouth daily. 45 tablet 3  . omega-3 acid ethyl esters (LOVAZA) 1 g capsule Take 2 capsules (2 g total) by mouth 2 (two) times daily. 120 capsule 11  . potassium chloride SA (KLOR-CON) 20 MEQ tablet Take 2 tablets (40 mEq total) by mouth daily. Take with IV lasix. 60 tablet 3  . rosuvastatin (CRESTOR) 40 MG tablet Take 1 tablet (40 mg total) by mouth daily at 6 PM. 90 tablet 3  . sacubitril-valsartan (ENTRESTO) 24-26 MG Take 1 tablet by mouth 2 (two) times daily. 180 tablet 3  . spironolactone (ALDACTONE) 25 MG tablet Take 1/2 (one-half) tablet by mouth once daily 45 tablet 0  . torsemide (DEMADEX) 20 MG tablet Take 3 tablets (60 mg total) by mouth daily. 90 tablet 3   No current facility-administered medications for this encounter.    No Known Allergies    Social History   Socioeconomic History  . Marital status: Single    Spouse name: Not on file  . Number of children: 1  . Years of education: 60  . Highest education level: Not on file  Occupational History  . 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 Smoking status: Never Smoker  . Smokeless tobacco: Never Used  Vaping Use  . Vaping Use: Never used  Substance and Sexual Activity  . Alcohol use: No  . Drug use: No  . Sexual activity: Not on file  Other Topics Concern  . Not on file  Social History Narrative   Born and raised in [place], [place]. Currently live in a private residence with his mother. Fun: Play with his daughter.   Denies religious beliefs that would effect health care.    Social Determinants of Health   Financial Resource Strain:   . Difficulty of Paying Living Expenses: Not on file  Food Insecurity:   . Worried About Programme researcher, broadcasting/film/video in the Last Year: Not on file  . Ran Out of Food in the Last Year: Not on file  Transportation Needs:   . Lack of  Transportation (Medical): Not on file  . Lack of Transportation (Non-Medical): Not on file  Physical Activity:   . Days of Exercise per Week: Not on file  . Minutes of Exercise per Session: Not on file  Stress:   . Feeling of Stress : Not on file  Social Connections:   . Frequency of Communication with Friends and Family: Not on file  . Frequency of Social Gatherings with Friends and Family: Not on file  . Attends Religious Services: Not on file  . Active Member of Clubs or Organizations: Not on file  .  Attends Banker Meetings: Not on file  . Marital Status: Not on file  Intimate Partner Violence:   . Fear of Current or Ex-Partner: Not on file  . Emotionally Abused: Not on file  . Physically Abused: Not on file  . Sexually Abused: Not on file      Family History  Problem Relation Age of Onset  . Hypertension Mother   . High Cholesterol Mother   . Depression Mother   . Anxiety disorder Mother   . Sleep apnea Mother   . Obesity Mother   . Heart disease Maternal Grandfather   . Hypertension Maternal Grandfather   . Hypertension Sister   . High Cholesterol Father   . High blood pressure Father   . Obesity Father     Vitals:   09/12/20 0904  BP: 108/80  Pulse: 78  SpO2: 92%  Weight: 124.5 kg (274 lb 6.4 oz)   Wt Readings from Last 3 Encounters:  09/12/20 124.5 kg (274 lb 6.4 oz)  09/10/20 123.4 kg (272 lb)  09/06/20 125.6 kg (277 lb)    PHYSICAL EXAM: General:  Young, moderately obese AAM. No respiratory difficulty HEENT: normal Neck: supple. Thick neck, JVD assessment difficult. Carotids 2+ bilat; no bruits. No lymphadenopathy or thyromegaly appreciated. Cor: PMI nondisplaced. Regular rate & rhythm. No rubs, gallops or murmurs. Lungs: clear Abdomen: obese and distended, nontender. No hepatosplenomegaly. No bruits or masses. Good bowel sounds. Extremities: no cyanosis, clubbing, rash, no edema Neuro: alert & oriented x 3, cranial nerves grossly  intact. moves all 4 extremities w/o difficulty. Flat affect    ASSESSMENT & PLAN:  1. Chronic Combined Systolic and Diastolic Heart Failure (NICM>> Likely Hypertensive).   - EF previosuly 15-20% - Echo 11/10/19 EF improved to 45% - Echo today w/ normalized EF, 60-66%, RV mild-mod HK. Severe LVH (likey hypertensive CM).  - Chronically NYHA II-III. Recent issues w/ ongoing fluid overload, recent ED evaluation and need for IV diuretics. Volume assessment challenging due to body habitus. Unable to get quality reading on ReDs Clip today. Wt still above previous dry wt but ? How much of this is CHF. Recent BNPs have been normal, but may also be falsely low in the setting of obesity.  - will refer for RHC for further assessment of fluid status to help guide diuretics  - for now, continue torsemide 60 mg daily  - continue Coreg 25 mg daily  - continue Entresto 25-26 mg bid - continue Spiro 12.5 mg daily  - continue hydralazine 100 mg bid - check BMP today - continue w/ paramedicine. Appreciate their assistance - we previously recommended cardiomems but pt declined   2.  EHU3J - check BMP today    3. HTN - well controlled - continue current regimen - check BMP today   4. OSA - continue CPAP   5. Severe hypertriglyceridemia - TGs 1,100-> 1,300>347  - Continue Crestor - Waiting on insurance approval for Vascepa. Unable to afford fenofibrate (per pt and paramedicine) - If develops signs of hyperviscosity (AMS, SOB, visual changes) can consider plasmapheresis   6. Obesity -Body mass index is 44.29 kg/m.  - wt loss advised   F/u in 2 weeks, post RHC   Viviana Trimble [place], PA-C 09/12/20

## 2020-09-12 NOTE — Telephone Encounter (Signed)
CSW informed by [employer] that pt will be unable to pay rent this month as he has to miss two days of work in order to quarantine prior to upcoming procedure.  CSW called pt who confirmed above and states he already spoke to his property management company and they are unable to work with him on payment plan for this month.  Pt will owe $475 on 09/19/20.  We are able to assist pt with rent for this month through patient care fund- CSW called landlord to inform and request invoice.  Once invoice is received it will be forwarded to accounts payable so we can issue a check.  Will continue to follow and assist as needed  Burna Sis, LCSW Clinical Social Worker Advanced Heart Failure Clinic Desk#: [phone] Cell#: [phone]

## 2020-09-12 NOTE — Telephone Encounter (Signed)
Pt aware via voicemail 

## 2020-09-12 NOTE — Progress Notes (Signed)
Paramedicine Encounter   Patient ID: Steven Chase , male,   DOB: September 28, 1986,33 y.o.,  MRN: [id number]  [name] was see at the HF clinic today and reported feeling well. He had an echocardiogram this morning which revealed an increase in his EF. No medication changes were made but he will be rescheduled for a RHC. His medications were verified and his pillbox was refilled. I will follow up next week.   [name], EMT 09/12/2020

## 2020-09-12 NOTE — Progress Notes (Signed)
  Echocardiogram 2D Echocardiogram has been performed.  Steven Chase 09/12/2020, 8:54 AM

## 2020-09-12 NOTE — Telephone Encounter (Signed)
-----   Message from Allayne Butcher, [place] sent at 09/12/2020  4:48 PM EST ----- Scr c/w baseline. K low at 3.3. increase KCl to 60 meq daily. Repeat BMP in 1 week

## 2020-09-12 NOTE — Patient Instructions (Signed)
It was great to see you today! No medication changes are needed at this time.   Your physician recommends that you schedule a follow-up appointment in: 2-3 weeks  in the Advanced Practitioners (PA/NP) Clinic      You are scheduled for a Cardiac Catheterization on Monday, November 15 with Dr. Arvilla Meres.  1. Please arrive at the [facility] (Main Entrance A) at [facility]: [address] [place], [place] [address] at 7:30 AM (This time is two hours before your procedure to ensure your preparation). Free valet parking service is available.   Special note: Every effort is made to have your procedure done on time. Please understand that emergencies sometimes delay scheduled procedures.  2. Diet: Do not eat solid foods after midnight.  The patient may have clear liquids until 5am upon the day of the procedure.  3. Labs: Pre Procedure labs done 09/12/20  You will need a pre procedure COVID test    WHEN: 09/13/20  anytime between 9am-3pm WHERE: COVID Test Site [address] [place], [place] [address]  This is a drive thru testing site, you will remain in your car. Be sure to get in the line FOR PROCEDURES Once you have been swabbed you will need to remain home in quarantine until you return for your procedure. .  4. Medication instructions in preparation for your procedure:   Contrast Allergy: No   Stop taking, Torsemide (Demadex) Monday, November 15,    On the morning of your procedure, take your Aspirin and any morning medicines NOT listed above.  You may use sips of water.  5. Plan for one night stay--bring personal belongings. 6. Bring a current list of your medications and current insurance cards. 7. You MUST have a responsible person to drive you home. 8. Someone MUST be with you the first 24 hours after you arrive home or your discharge will be delayed. 9. Please wear clothes that are easy to get on and off and wear slip-on shoes.  Thank you for allowing  [place] to care for you!   -- [facility]

## 2020-09-13 ENCOUNTER — Encounter (HOSPITAL_COMMUNITY): Payer: Self-pay

## 2020-09-13 ENCOUNTER — Other Ambulatory Visit (HOSPITAL_COMMUNITY)
Admission: RE | Admit: 2020-09-13 | Discharge: 2020-09-13 | Disposition: A | Discharge status: Home / Self Care | Payer: 59 | Source: Ambulatory Visit | Attending: Internal Medicine | Admitting: Internal Medicine

## 2020-09-13 DIAGNOSIS — Z20822 Contact with and (suspected) exposure to covid-19: Secondary | ICD-10-CM | POA: Insufficient documentation

## 2020-09-13 DIAGNOSIS — Z01812 Encounter for preprocedural laboratory examination: Secondary | ICD-10-CM | POA: Diagnosis present

## 2020-09-13 LAB — SARS CORONAVIRUS 2 (TAT 6-24 HRS): SARS Coronavirus 2: NEGATIVE

## 2020-09-16 ENCOUNTER — Ambulatory Visit (HOSPITAL_COMMUNITY)
Admission: RE | Admit: 2020-09-16 | Discharge: 2020-09-16 | Disposition: A | Discharge status: Home / Self Care | Payer: 59 | Source: Ambulatory Visit | Attending: Internal Medicine | Admitting: Internal Medicine

## 2020-09-16 ENCOUNTER — Encounter (HOSPITAL_COMMUNITY): Payer: Self-pay | Admitting: Internal Medicine

## 2020-09-16 ENCOUNTER — Encounter (HOSPITAL_COMMUNITY)
Admission: RE | Disposition: A | Discharge status: Home / Self Care | Payer: Self-pay | Source: Ambulatory Visit | Attending: Internal Medicine

## 2020-09-16 DIAGNOSIS — Z79899 Other long term (current) drug therapy: Secondary | ICD-10-CM | POA: Insufficient documentation

## 2020-09-16 DIAGNOSIS — I130 Hypertensive heart and chronic kidney disease with heart failure and stage 1 through stage 4 chronic kidney disease, or unspecified chronic kidney disease: Secondary | ICD-10-CM | POA: Diagnosis present

## 2020-09-16 DIAGNOSIS — G4733 Obstructive sleep apnea (adult) (pediatric): Secondary | ICD-10-CM | POA: Diagnosis not present

## 2020-09-16 DIAGNOSIS — I428 Other cardiomyopathies: Secondary | ICD-10-CM | POA: Insufficient documentation

## 2020-09-16 DIAGNOSIS — E781 Pure hyperglyceridemia: Secondary | ICD-10-CM | POA: Insufficient documentation

## 2020-09-16 DIAGNOSIS — I5082 Biventricular heart failure: Secondary | ICD-10-CM | POA: Diagnosis not present

## 2020-09-16 DIAGNOSIS — N1831 Chronic kidney disease, stage 3a: Secondary | ICD-10-CM | POA: Diagnosis not present

## 2020-09-16 DIAGNOSIS — I5022 Chronic systolic (congestive) heart failure: Secondary | ICD-10-CM

## 2020-09-16 DIAGNOSIS — I50812 Chronic right heart failure: Secondary | ICD-10-CM

## 2020-09-16 DIAGNOSIS — I5042 Chronic combined systolic (congestive) and diastolic (congestive) heart failure: Secondary | ICD-10-CM | POA: Insufficient documentation

## 2020-09-16 DIAGNOSIS — E669 Obesity, unspecified: Secondary | ICD-10-CM | POA: Insufficient documentation

## 2020-09-16 DIAGNOSIS — Z6841 Body Mass Index (BMI) 40.0 and over, adult: Secondary | ICD-10-CM | POA: Insufficient documentation

## 2020-09-16 HISTORY — PX: RIGHT HEART CATH: CATH118263

## 2020-09-16 LAB — POCT I-STAT EG7
Acid-Base Excess: 8 mmol/L — ABNORMAL HIGH (ref 0.0–2.0)
Acid-Base Excess: 9 mmol/L — ABNORMAL HIGH (ref 0.0–2.0)
Bicarbonate: 37 mmol/L — ABNORMAL HIGH (ref 20.0–28.0)
Bicarbonate: 37.5 mmol/L — ABNORMAL HIGH (ref 20.0–28.0)
Calcium, Ion: 1.22 mmol/L (ref 1.15–1.40)
Calcium, Ion: 1.24 mmol/L (ref 1.15–1.40)
HCT: 47 % (ref 39.0–52.0)
HCT: 47 % (ref 39.0–52.0)
Hemoglobin: 16 g/dL (ref 13.0–17.0)
Hemoglobin: 16 g/dL (ref 13.0–17.0)
O2 Saturation: 63 %
O2 Saturation: 65 %
Potassium: 3.4 mmol/L — ABNORMAL LOW (ref 3.5–5.1)
Potassium: 3.5 mmol/L (ref 3.5–5.1)
Sodium: 145 mmol/L (ref 135–145)
Sodium: 145 mmol/L (ref 135–145)
TCO2: 39 mmol/L — ABNORMAL HIGH (ref 22–32)
TCO2: 40 mmol/L — ABNORMAL HIGH (ref 22–32)
pCO2, Ven: 68.1 mmHg — ABNORMAL HIGH (ref 44.0–60.0)
pCO2, Ven: 69 mmHg — ABNORMAL HIGH (ref 44.0–60.0)
pH, Ven: 7.342 (ref 7.250–7.430)
pH, Ven: 7.344 (ref 7.250–7.430)
pO2, Ven: 36 mmHg (ref 32.0–45.0)
pO2, Ven: 37 mmHg (ref 32.0–45.0)

## 2020-09-16 LAB — BASIC METABOLIC PANEL
Anion gap: 10 (ref 5–15)
BUN: 17 mg/dL (ref 6–20)
CO2: 32 mmol/L (ref 22–32)
Calcium: 9.2 mg/dL (ref 8.9–10.3)
Chloride: 100 mmol/L (ref 98–111)
Creatinine, Ser: 1.61 mg/dL — ABNORMAL HIGH (ref 0.61–1.24)
GFR, Estimated: 57 mL/min — ABNORMAL LOW (ref >60)
Glucose, Bld: 106 mg/dL — ABNORMAL HIGH (ref 70–99)
Potassium: 3.5 mmol/L (ref 3.5–5.1)
Sodium: 142 mmol/L (ref 135–145)

## 2020-09-16 LAB — POCT I-STAT 7, (LYTES, BLD GAS, ICA,H+H)
Acid-Base Excess: 6 mmol/L — ABNORMAL HIGH (ref 0.0–2.0)
Bicarbonate: 34 mmol/L — ABNORMAL HIGH (ref 20.0–28.0)
Calcium, Ion: 1.23 mmol/L (ref 1.15–1.40)
HCT: 46 % (ref 39.0–52.0)
Hemoglobin: 15.6 g/dL (ref 13.0–17.0)
O2 Saturation: 93 %
Potassium: 3.4 mmol/L — ABNORMAL LOW (ref 3.5–5.1)
Sodium: 145 mmol/L (ref 135–145)
TCO2: 36 mmol/L — ABNORMAL HIGH (ref 22–32)
pCO2 arterial: 60.9 mmHg — ABNORMAL HIGH (ref 32.0–48.0)
pH, Arterial: 7.355 (ref 7.350–7.450)
pO2, Arterial: 73 mmHg — ABNORMAL LOW (ref 83.0–108.0)

## 2020-09-16 SURGERY — RIGHT HEART CATH
Anesthesia: LOCAL

## 2020-09-16 MED ORDER — HEPARIN (PORCINE) IN NACL 1000-0.9 UT/500ML-% IV SOLN
INTRAVENOUS | Status: AC
  Filled 2020-09-16: qty 500

## 2020-09-16 MED ORDER — LIDOCAINE HCL (PF) 1 % IJ SOLN
INTRAMUSCULAR | Status: AC
  Filled 2020-09-16: qty 30

## 2020-09-16 MED ORDER — SODIUM CHLORIDE 0.9 % IV SOLN: 250.0000 mL | INTRAVENOUS | Status: DC | PRN

## 2020-09-16 MED ORDER — SODIUM CHLORIDE 0.9% FLUSH: 3.0000 mL | INTRAVENOUS | Status: DC | PRN

## 2020-09-16 MED ORDER — LABETALOL HCL 5 MG/ML IV SOLN: 10.0000 mg | INTRAVENOUS | Status: DC | PRN

## 2020-09-16 MED ORDER — HEPARIN (PORCINE) IN NACL 1000-0.9 UT/500ML-% IV SOLN
INTRAVENOUS | Status: DC | PRN
  Administered 2020-09-16: 500 mL

## 2020-09-16 MED ORDER — SODIUM CHLORIDE 0.9% FLUSH: 3.0000 mL | Freq: Two times a day (BID) | INTRAVENOUS | Status: DC

## 2020-09-16 MED ORDER — CARVEDILOL 12.5 MG PO TABS
25.0000 mg | ORAL_TABLET | Freq: Once | ORAL | Status: AC
  Administered 2020-09-16: 25 mg via ORAL
  Filled 2020-09-16 (×2): qty 2

## 2020-09-16 MED ORDER — ISOSORBIDE MONONITRATE ER 60 MG PO TB24
90.0000 mg | ORAL_TABLET | Freq: Once | ORAL | Status: AC
  Administered 2020-09-16: 90 mg via ORAL
  Filled 2020-09-16: qty 1

## 2020-09-16 MED ORDER — SODIUM CHLORIDE 0.9 % IV SOLN: INTRAVENOUS | Status: DC

## 2020-09-16 MED ORDER — HYDRALAZINE HCL 50 MG PO TABS
100.0000 mg | ORAL_TABLET | Freq: Once | ORAL | Status: AC
  Administered 2020-09-16: 100 mg via ORAL
  Filled 2020-09-16: qty 2

## 2020-09-16 MED ORDER — ISOSORBIDE MONONITRATE ER 60 MG PO TB24: 60.0000 mg | ORAL_TABLET | Freq: Once | ORAL | Status: DC

## 2020-09-16 MED ORDER — HYDRALAZINE HCL 20 MG/ML IJ SOLN: 10.0000 mg | INTRAMUSCULAR | Status: DC | PRN

## 2020-09-16 MED ORDER — ACETAMINOPHEN 325 MG PO TABS: 650.0000 mg | ORAL_TABLET | ORAL | Status: DC | PRN

## 2020-09-16 MED ORDER — SACUBITRIL-VALSARTAN 24-26 MG PO TABS
1.0000 | ORAL_TABLET | Freq: Once | ORAL | Status: AC
  Administered 2020-09-16: 1 via ORAL
  Filled 2020-09-16: qty 1

## 2020-09-16 MED ORDER — ASPIRIN 81 MG PO CHEW
81.0000 mg | CHEWABLE_TABLET | ORAL | Status: AC
  Administered 2020-09-16: 81 mg via ORAL
  Filled 2020-09-16: qty 1

## 2020-09-16 MED ORDER — ONDANSETRON HCL 4 MG/2ML IJ SOLN: 4.0000 mg | Freq: Four times a day (QID) | INTRAMUSCULAR | Status: DC | PRN

## 2020-09-16 MED ORDER — LIDOCAINE HCL (PF) 1 % IJ SOLN
INTRAMUSCULAR | Status: DC | PRN
  Administered 2020-09-16: 2 mL

## 2020-09-16 SURGICAL SUPPLY — 8 items
CATH BALLN WEDGE 5F 110CM (CATHETERS) ×1 IMPLANT
PACK CARDIAC CATHETERIZATION (CUSTOM PROCEDURE TRAY) ×2 IMPLANT
PROTECTION STATION PRESSURIZED (MISCELLANEOUS) ×2
SHEATH GLIDE SLENDER 4/5FR (SHEATH) ×1 IMPLANT
STATION PROTECTION PRESSURIZED (MISCELLANEOUS) IMPLANT
TRANSDUCER W/STOPCOCK (MISCELLANEOUS) ×2 IMPLANT
TUBING ART PRESS 72  MALE/FEM (TUBING) ×2
TUBING ART PRESS 72 MALE/FEM (TUBING) IMPLANT

## 2020-09-16 NOTE — Interval H&P Note (Signed)
History and Physical Interval Note:  09/16/2020 9:48 AM  [name]  has presented today for surgery, with the diagnosis of heart failure.  The various methods of treatment have been discussed with the patient and family. After consideration of risks, benefits and other options for treatment, the patient has consented to  Procedure(s): RIGHT HEART CATH (N/A) as a surgical intervention.  The patient's history has been reviewed, patient examined, no change in status, stable for surgery.  I have reviewed the patient's chart and labs.  Questions were answered to the patient's satisfaction.     Steven Chase

## 2020-09-16 NOTE — Research (Signed)
[place] Informed Consent   Subject Name: Steven Chase  Subject met inclusion and exclusion criteria.  The informed consent form, study requirements and expectations were reviewed with the subject and questions and concerns were addressed prior to the signing of the consent form.  The subject verbalized understanding of the trail requirements.  The subject agreed to participate in the [facility] trial and signed the informed consent.  The informed consent was obtained prior to performance of any protocol-specific procedures for the subject.  A copy of the signed informed consent was given to the subject and a copy was placed in the subject's medical record.  [name]. 09/16/2020, 07:45 am

## 2020-09-16 NOTE — Discharge Instructions (Signed)
Brachial Site Care  This sheet gives you information about how to care for yourself after your procedure. Your health care provider may also give you more specific instructions. If you have problems or questions, contact your health care provider. What can I expect after the procedure? After the procedure, it is common to have:  Bruising and tenderness at the catheter insertion area. Follow these instructions at home: Medicines  Take over-the-counter and prescription medicines only as told by your health care provider. Insertion site care  Follow instructions from your health care provider about how to take care of your insertion site. Make sure you: ? Wash your hands with soap and water before you change your bandage (dressing). If soap and water are not available, use hand sanitizer. ? Change your dressing as told by your health care provider. ? Leave stitches (sutures), skin glue, or adhesive strips in place. These skin closures may need to stay in place for 2 weeks or longer. If adhesive strip edges start to loosen and curl up, you may trim the loose edges. Do not remove adhesive strips completely unless your health care provider tells you to do that.  Check your insertion site every day for signs of infection. Check for: ? Redness, swelling, or pain. ? Fluid or blood. ? Pus or a bad smell. ? Warmth.  Do not take baths, swim, or use a hot tub until your health care provider approves.  You may shower 24-48 hours after the procedure, or as directed by your health care provider. ? Remove the dressing and gently wash the site with plain soap and water. ? Pat the area dry with a clean towel. ? Do not rub the site. That could cause bleeding.  Do not apply powder or lotion to the site. Activity   For 24 hours after the procedure, or as directed by your health care provider: ? Do not flex or bend the affected arm. ? Do not push or pull heavy objects with the affected arm. ? Do not  drive yourself home from the hospital or clinic. You may drive 24 hours after the procedure unless your health care provider tells you not to. ? Do not operate machinery or power tools.  Do not lift anything that is heavier than 10 lb (4.5 kg), or the limit that you are told, until your health care provider says that it is safe.  Ask your health care provider when it is okay to: ? Return to work or school. ? Resume usual physical activities or sports. ? Resume sexual activity. General instructions  If the catheter site starts to bleed, raise your arm and put firm pressure on the site. If the bleeding does not stop, get help right away. This is a medical emergency.  If you went home on the same day as your procedure, a responsible adult should be with you for the first 24 hours after you arrive home.  Keep all follow-up visits as told by your health care provider. This is important. Contact a health care provider if:  You have a fever.  You have redness, swelling, or yellow drainage around your insertion site. Get help right away if:  You have unusual pain at the radial site.  The catheter insertion area swells very fast.  The insertion area is bleeding, and the bleeding does not stop when you hold steady pressure on the area.  Your arm or hand becomes pale, cool, tingly, or numb. These symptoms may represent a serious problem   that is an emergency. Do not wait to see if the symptoms will go away. Get medical help right away. Call your local emergency services (911 in the [place] Do not drive yourself to the hospital. Summary  After the procedure, it is common to have bruising and tenderness at the site.  Follow instructions from your health care provider about how to take care of your radial site wound. Check the wound every day for signs of infection.  Do not lift anything that is heavier than 10 lb (4.5 kg), or the limit that you are told, until your health care provider says  that it is safe. This information is not intended to replace advice given to you by your health care provider. Make sure you discuss any questions you have with your health care provider. Document Revised: 11/24/2017 Document Reviewed: 11/24/2017 Elsevier Patient Education  2020 Elsevier Inc[employer]   

## 2020-09-17 ENCOUNTER — Telehealth (HOSPITAL_COMMUNITY): Payer: Self-pay | Admitting: Cardiology

## 2020-09-17 DIAGNOSIS — I5022 Chronic systolic (congestive) heart failure: Secondary | ICD-10-CM

## 2020-09-17 MED ORDER — TORSEMIDE 20 MG PO TABS: 80.0000 mg | ORAL_TABLET | Freq: Every day | ORAL | 3 refills | Status: AC

## 2020-09-17 MED ORDER — POTASSIUM CHLORIDE CRYS ER 20 MEQ PO TBCR
80.0000 meq | EXTENDED_RELEASE_TABLET | Freq: Every day | ORAL | 3 refills | Status: DC
Start: 2020-09-17 — End: 2020-09-25

## 2020-09-17 NOTE — Telephone Encounter (Signed)
Pt aware and voiced understanding 

## 2020-09-17 NOTE — Telephone Encounter (Signed)
-----   Message from Allayne Butcher, [place] sent at 09/17/2020  3:55 PM EST ----- Per [facility] findings, can increase diuretics slightly. Increase torsemide to 80 mg daily and increase KCl to 80 mEq daily. Check BMP in 1 week.

## 2020-09-18 ENCOUNTER — Other Ambulatory Visit (HOSPITAL_COMMUNITY): Payer: Self-pay

## 2020-09-18 NOTE — Progress Notes (Signed)
` Paramedicine Encounter    Patient ID: Steven Chase, male    DOB: June 15, 1986, 34 y.o.   MRN: [id number]   Patient Care Team: Corwin Levins, MD as PCP - General (Internal Medicine) Nahser, Deloris Ping, MD as PCP - Cardiology (Cardiology) Burna Sis, LCSW as Social Worker (Licensed Clinical Social Worker)  Patient Active Problem List   Diagnosis Date Noted   Prediabetes 08/05/2020   Elevated liver enzymes 02/06/2020   CKD (chronic kidney disease) stage 3, GFR 30-59 ml/min (HCC) 02/06/2020   Vitamin D deficiency 02/06/2020   Dyslipidemia (high LDL; low HDL) 02/06/2020   Hypertriglyceridemia 02/06/2020   Morbid obesity (HCC) 02/05/2020   Acute gout of left ankle 01/04/2020   AKI (acute kidney injury) (HCC) 11/10/2019   Acute exacerbation of CHF (congestive heart failure) (HCC) 07/15/2019   Lipodystrophy 10/21/2018   Chronic systolic heart failure (HCC) 02/17/2018   Fatigue 02/17/2018   Cardiomyopathy (HCC) 02/11/2018   Hypertensive crisis 02/04/2018   Chest pain 02/04/2018   Allergic rhinitis 01/21/2018   Patellar tendonitis of left knee 12/02/2017   Preventative health care 12/10/2016   Obesity 05/25/2016   Lower leg edema 12/12/2014   Essential hypertension 11/05/2014   OSA (obstructive sleep apnea) 11/05/2014    Current Outpatient Medications:    acetaminophen (TYLENOL) 500 MG tablet, Take 500 mg by mouth every 6 (six) hours as needed for moderate pain or headache., Disp: , Rfl:    allopurinol (ZYLOPRIM) 100 MG tablet, Take 1 tablet by mouth twice daily (Patient taking differently: Take 100 mg by mouth 2 (two) times daily. ), Disp: 60 tablet, Rfl: 0   carvedilol (COREG) 25 MG tablet, TAKE 1 TABLET BY MOUTH TWICE DAILY WITH A MEAL (Patient taking differently: Take 25 mg by mouth 2 (two) times daily with a meal. ), Disp: 60 tablet, Rfl: 3   colchicine 0.6 MG tablet, Take 0.6 mg by mouth daily as needed (gout flare). , Disp: , Rfl:    fenofibrate  160 MG tablet, Take 1 tablet by mouth once daily (Patient taking differently: Take 160 mg by mouth daily. ), Disp: 90 tablet, Rfl: 0   hydrALAZINE (APRESOLINE) 100 MG tablet, Take 1 tablet (100 mg total) by mouth every 8 (eight) hours., Disp: 90 tablet, Rfl: 11   isosorbide mononitrate (IMDUR) 60 MG 24 hr tablet, Take 1.5 tablets (90 mg total) by mouth daily., Disp: 45 tablet, Rfl: 3   omega-3 acid ethyl esters (LOVAZA) 1 g capsule, Take 2 capsules (2 g total) by mouth 2 (two) times daily., Disp: 120 capsule, Rfl: 11   potassium chloride SA (KLOR-CON) 20 MEQ tablet, Take 4 tablets (80 mEq total) by mouth daily., Disp: 120 tablet, Rfl: 3   rosuvastatin (CRESTOR) 40 MG tablet, Take 1 tablet (40 mg total) by mouth daily at 6 PM., Disp: 90 tablet, Rfl: 3   sacubitril-valsartan (ENTRESTO) 24-26 MG, Take 1 tablet by mouth 2 (two) times daily., Disp: 180 tablet, Rfl: 3   spironolactone (ALDACTONE) 25 MG tablet, Take 1/2 (one-half) tablet by mouth once daily (Patient taking differently: Take 12.5 mg by mouth daily. ), Disp: 45 tablet, Rfl: 0   torsemide (DEMADEX) 20 MG tablet, Take 4 tablets (80 mg total) by mouth daily., Disp: 120 tablet, Rfl: 3 No Known Allergies    Social History   Socioeconomic History   Marital status: Single    Spouse name: Not on file   Number of children: 1   Years of education: 42  Highest education level: Not on file  Occupational History   Occupation: Unloader    Employer: [employer]  Tobacco Use   Smoking status: Never Smoker   Smokeless tobacco: Never Used  Vaping Use   Vaping Use: Never used  Substance and Sexual Activity   Alcohol use: No   Drug use: No   Sexual activity: Not on file  Other Topics Concern   Not on file  Social History Narrative   Born and raised in [place], [place]. Currently live in a private residence with his mother. Fun: Play with his daughter.   Denies religious beliefs that would effect health care.     Social Determinants of Health   Financial Resource Strain:    Difficulty of Paying Living Expenses: Not on file  Food Insecurity:    Worried About Programme researcher, broadcasting/film/video in the Last Year: Not on file   [employer] of Food in the Last Year: Not on file  Transportation Needs:    Lack of Transportation (Medical): Not on file   Lack of Transportation (Non-Medical): Not on file  Physical Activity:    Days of Exercise per Week: Not on file   Minutes of Exercise per Session: Not on file  Stress:    Feeling of Stress : Not on file  Social Connections:    Frequency of Communication with Friends and Family: Not on file   Frequency of Social Gatherings with Friends and Family: Not on file   Attends Religious Services: Not on file   Active Member of Clubs or Organizations: Not on file   Attends Banker Meetings: Not on file   Marital Status: Not on file  Intimate Partner Violence:    Fear of Current or Ex-Partner: Not on file   Emotionally Abused: Not on file   Physically Abused: Not on file   Sexually Abused: Not on file    Physical Exam Cardiovascular:     Rate and Rhythm: Normal rate and regular rhythm.     Pulses: Normal pulses.  Pulmonary:     Effort: Pulmonary effort is normal.     Breath sounds: Normal breath sounds.  Musculoskeletal:        General: Normal range of motion.     Right lower leg: Edema present.     Left lower leg: Edema present.  Skin:    General: Skin is warm and dry.     Capillary Refill: Capillary refill takes less than 2 seconds.  Neurological:     Mental Status: He is alert and oriented to person, place, and time.  Psychiatric:        Mood and Affect: Mood normal.         Future Appointments  Date Time Provider Department Center  09/25/2020 12:00 PM [facility] None  10/21/2020 12:20 PM Bensimhon, Bevelyn Buckles, MD [facility] None  02/05/2021 11:00 AM Corwin Levins, MD [facility] None    BP (!) 131/91 (BP Location:  Left Arm, Patient Position: Sitting, Cuff Size: Normal)    Pulse 85    Resp 18    Wt 272 lb 9.6 oz (123.7 kg)    SpO2 94%    BMI 44.00 kg/m   Weight yesterday- 274 lb Last visit weight- 274.4 lb  Mr Ausburn was seen at home today and reported feeling well. He denied chest pain, SOB, headache, dizziness, orthopnea, fever or cough. He stated he has been compliant with his medications and his weight has been stable.  He has increased torsemide and potassium as directed by the HF clinic however his insurance company is unwilling to pay for the new dose of torsemide because it is too early. I contacted the pharmacy who is attempting to reach the insurance company and get an override but if they are unable I will have the clinic pay for the out of pocket cost of $32. I was also able to deliver a mattress and new bed frame which he has asked for last week. He will need more supports under the bed so I will bring those tomorrow when he gets home from work His medications were verified and his pillbox was refilled. I will follow up tomorrow for the support boards and next week for a full visit.   [name], EMT 09/18/20  ACTION: Home visit completed Next visit planned for 1 week

## 2020-09-24 ENCOUNTER — Telehealth (HOSPITAL_COMMUNITY): Payer: Self-pay

## 2020-09-24 NOTE — Telephone Encounter (Signed)
I called Mr Theisen to schedule an appointment. He did not answer so I left a message requesting he call me back.   [name], EMT 09/24/20

## 2020-09-25 ENCOUNTER — Telehealth (HOSPITAL_COMMUNITY): Payer: Self-pay | Admitting: *Deleted

## 2020-09-25 ENCOUNTER — Other Ambulatory Visit: Payer: Self-pay

## 2020-09-25 ENCOUNTER — Ambulatory Visit (HOSPITAL_COMMUNITY)
Admission: RE | Admit: 2020-09-25 | Discharge: 2020-09-25 | Disposition: A | Discharge status: Home / Self Care | Payer: 59 | Source: Ambulatory Visit | Attending: Internal Medicine | Admitting: Internal Medicine

## 2020-09-25 ENCOUNTER — Other Ambulatory Visit (HOSPITAL_COMMUNITY): Payer: Self-pay

## 2020-09-25 ENCOUNTER — Telehealth (HOSPITAL_COMMUNITY): Payer: Self-pay

## 2020-09-25 DIAGNOSIS — I5022 Chronic systolic (congestive) heart failure: Secondary | ICD-10-CM | POA: Insufficient documentation

## 2020-09-25 LAB — BASIC METABOLIC PANEL
Anion gap: 14 (ref 5–15)
BUN: 20 mg/dL (ref 6–20)
CO2: 32 mmol/L (ref 22–32)
Calcium: 9.4 mg/dL (ref 8.9–10.3)
Chloride: 98 mmol/L (ref 98–111)
Creatinine, Ser: 2.08 mg/dL — ABNORMAL HIGH (ref 0.61–1.24)
GFR, Estimated: 42 mL/min — ABNORMAL LOW (ref >60)
Glucose, Bld: 106 mg/dL — ABNORMAL HIGH (ref 70–99)
Potassium: 3.4 mmol/L — ABNORMAL LOW (ref 3.5–5.1)
Sodium: 144 mmol/L (ref 135–145)

## 2020-09-25 MED ORDER — POTASSIUM CHLORIDE CRYS ER 20 MEQ PO TBCR: EXTENDED_RELEASE_TABLET | ORAL | 3 refills | Status: AC

## 2020-09-25 NOTE — Progress Notes (Signed)
Paramedicine Encounter    Patient ID: Steven Chase, male    DOB: 1986/01/25, 34 y.o.   MRN: [id number]   Patient Care Team: Corwin Levins, MD as PCP - General (Internal Medicine) Nahser, Deloris Ping, MD as PCP - Cardiology (Cardiology) Burna Sis, LCSW as Social Worker (Licensed Clinical Social Worker)  Patient Active Problem List   Diagnosis Date Noted  . Prediabetes 08/05/2020  . Elevated liver enzymes 02/06/2020  . CKD (chronic kidney disease) stage 3, GFR 30-59 ml/min (HCC) 02/06/2020  . Vitamin D deficiency 02/06/2020  . Dyslipidemia (high LDL; low HDL) 02/06/2020  . Hypertriglyceridemia 02/06/2020  . Morbid obesity (HCC) 02/05/2020  . Acute gout of left ankle 01/04/2020  . AKI (acute kidney injury) (HCC) 11/10/2019  . Acute exacerbation of CHF (congestive heart failure) (HCC) 07/15/2019  . Lipodystrophy 10/21/2018  . Chronic systolic heart failure (HCC) 02/17/2018  . Fatigue 02/17/2018  . Cardiomyopathy (HCC) 02/11/2018  . Hypertensive crisis 02/04/2018  . Chest pain 02/04/2018  . Allergic rhinitis 01/21/2018  . Patellar tendonitis of left knee 12/02/2017  . Preventative health care 12/10/2016  . Obesity 05/25/2016  . Lower leg edema 12/12/2014  . Essential hypertension 11/05/2014  . OSA (obstructive sleep apnea) 11/05/2014    Current Outpatient Medications:  .  acetaminophen (TYLENOL) 500 MG tablet, Take 500 mg by mouth every 6 (six) hours as needed for moderate pain or headache., Disp: , Rfl:  .  allopurinol (ZYLOPRIM) 100 MG tablet, Take 1 tablet by mouth twice daily (Patient taking differently: Take 100 mg by mouth 2 (two) times daily. ), Disp: 60 tablet, Rfl: 0 .  carvedilol (COREG) 25 MG tablet, TAKE 1 TABLET BY MOUTH TWICE DAILY WITH A MEAL (Patient taking differently: Take 25 mg by mouth 2 (two) times daily with a meal. ), Disp: 60 tablet, Rfl: 3 .  colchicine 0.6 MG tablet, Take 0.6 mg by mouth daily as needed (gout flare).  (Patient not taking: Reported on  09/18/2020), Disp: , Rfl:  .  fenofibrate 160 MG tablet, Take 1 tablet by mouth once daily (Patient taking differently: Take 160 mg by mouth daily. ), Disp: 90 tablet, Rfl: 0 .  hydrALAZINE (APRESOLINE) 100 MG tablet, Take 1 tablet (100 mg total) by mouth every 8 (eight) hours., Disp: 90 tablet, Rfl: 11 .  isosorbide mononitrate (IMDUR) 60 MG 24 hr tablet, Take 1.5 tablets (90 mg total) by mouth daily., Disp: 45 tablet, Rfl: 3 .  omega-3 acid ethyl esters (LOVAZA) 1 g capsule, Take 2 capsules (2 g total) by mouth 2 (two) times daily., Disp: 120 capsule, Rfl: 11 .  potassium chloride SA (KLOR-CON) 20 MEQ tablet, Take 4 tablets (80 mEq total) by mouth daily., Disp: 120 tablet, Rfl: 3 .  rosuvastatin (CRESTOR) 40 MG tablet, Take 1 tablet (40 mg total) by mouth daily at 6 PM., Disp: 90 tablet, Rfl: 3 .  sacubitril-valsartan (ENTRESTO) 24-26 MG, Take 1 tablet by mouth 2 (two) times daily., Disp: 180 tablet, Rfl: 3 .  spironolactone (ALDACTONE) 25 MG tablet, Take 1/2 (one-half) tablet by mouth once daily (Patient taking differently: Take 12.5 mg by mouth daily. ), Disp: 45 tablet, Rfl: 0 .  torsemide (DEMADEX) 20 MG tablet, Take 4 tablets (80 mg total) by mouth daily., Disp: 120 tablet, Rfl: 3 No Known Allergies    Social History   Socioeconomic History  . Marital status: Single    Spouse name: Not on file  . Number of children: 1  . Years  of education: 60  . Highest education level: Not on file  Occupational History  . 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 Smoking status: Never Smoker  . Smokeless tobacco: Never Used  Vaping Use  . Vaping Use: Never used  Substance and Sexual Activity  . Alcohol use: No  . Drug use: No  . Sexual activity: Not on file  Other Topics Concern  . Not on file  Social History Narrative   Born and raised in [place], [place]. Currently live in a private residence with his mother. Fun: Play with his daughter.   Denies religious  beliefs that would effect health care.    Social Determinants of Health   Financial Resource Strain:   . Difficulty of Paying Living Expenses: Not on file  Food Insecurity:   . Worried About Programme researcher, broadcasting/film/video in the Last Year: Not on file  . Ran Out of Food in the Last Year: Not on file  Transportation Needs:   . Lack of Transportation (Medical): Not on file  . Lack of Transportation (Non-Medical): Not on file  Physical Activity:   . Days of Exercise per Week: Not on file  . Minutes of Exercise per Session: Not on file  Stress:   . Feeling of Stress : Not on file  Social Connections:   . Frequency of Communication with Friends and Family: Not on file  . Frequency of Social Gatherings with Friends and Family: Not on file  . Attends Religious Services: Not on file  . Active Member of Clubs or Organizations: Not on file  . Attends Banker Meetings: Not on file  . Marital Status: Not on file  Intimate Partner Violence:   . Fear of Current or Ex-Partner: Not on file  . Emotionally Abused: Not on file  . Physically Abused: Not on file  . Sexually Abused: Not on file    Physical Exam Cardiovascular:     Rate and Rhythm: Normal rate and regular rhythm.     Pulses: Normal pulses.  Pulmonary:     Effort: Pulmonary effort is normal.     Breath sounds: Normal breath sounds.  Musculoskeletal:        General: Normal range of motion.     Right lower leg: Edema present.     Left lower leg: Edema present.  Skin:    General: Skin is warm and dry.     Capillary Refill: Capillary refill takes less than 2 seconds.  Neurological:     Mental Status: He is alert and oriented to person, place, and time.  Psychiatric:        Mood and Affect: Mood normal.         Future Appointments  Date Time Provider Department Center  10/21/2020 12:20 PM Bensimhon, Bevelyn Buckles, MD [facility] None  02/05/2021 11:00 AM Corwin Levins, MD [facility] None    BP 100/60 (BP Location: Left Arm,  Patient Position: Sitting, Cuff Size: Large)   Pulse 90   Resp 18   Wt 276 lb 6.4 oz (125.4 kg)   SpO2 93%   BMI 44.61 kg/m   Weight yesterday- did not weigh  Last visit weight- 272 lb  Mr Hinze was seen at home today and rpeorted feeling well. He denied chest pain, SOB, dizziness, orthopnea, headache, fever or cough over the past week. He reported being compliant with his medication but his weight is beginning to trend up. He reported to  still be eating low sodium via "Mom[employer] Meals" but I noted some soda bottles in his trash. I stressed the importance of fluid and sodium restrictions and he expressed understanding. His medications were verified and his pillbox was refilled. Blood for a BMP was obtained and delivered to the clinic for processing. I will follow up next week.  [name], EMT 09/25/20  ACTION: Home visit completed Next visit planned for 1 week

## 2020-09-25 NOTE — Telephone Encounter (Signed)
I called Mr Steven Chase upon speaking with Steven Lis, PA-C, regarding his labs today. His potassium remained low at 3.4 mmol/L so she advised to have him increased to 100 mEq daily. I made Mr Steven Chase aware and told him to ad it to the afternoon slot of his pillbox so avoid taking too much at one time. Mr Steven Chase was understanding and agreeable. He will have a repeat BMP on Tuesday at 11. I made him aware and he stated he would not need transportation. I will follow up next week.   [name], EMT 09/25/20

## 2020-09-25 NOTE — Telephone Encounter (Signed)
Zack aware and will notify pt, lab sch for 11/30

## 2020-09-25 NOTE — Telephone Encounter (Signed)
-----   Message from [place], [place] sent at 09/25/2020  4:34 PM EST ----- K remains low at 3.4 Increase KCl to 100 mEq daily. Repeat BMP on Tuesday.

## 2020-10-01 ENCOUNTER — Other Ambulatory Visit (HOSPITAL_COMMUNITY): Payer: 59

## 2020-10-01 LAB — BASIC METABOLIC PANEL
Anion gap: 12 (ref 5–15)
BUN: 24 mg/dL — ABNORMAL HIGH (ref 6–20)
CO2: 34 mmol/L — ABNORMAL HIGH (ref 22–32)
Calcium: 9.1 mg/dL (ref 8.9–10.3)
Chloride: 96 mmol/L — ABNORMAL LOW (ref 98–111)
Creatinine, Ser: 2.04 mg/dL — ABNORMAL HIGH (ref 0.61–1.24)
GFR, Estimated: 43 mL/min — ABNORMAL LOW (ref >60)
Glucose, Bld: 89 mg/dL (ref 70–99)
Potassium: 3.6 mmol/L (ref 3.5–5.1)
Sodium: 142 mmol/L (ref 135–145)

## 2020-10-02 ENCOUNTER — Telehealth (HOSPITAL_COMMUNITY): Payer: Self-pay | Admitting: Licensed Clinical Social Worker

## 2020-10-02 NOTE — Telephone Encounter (Signed)
Pt unable to pay for phone bill this month as he had less work shifts with the holidays.  Phone was to be turned off today- CSW able to pay for this months bill- no further needs at this time.  Will continue to follow and assist as needed  Burna Sis, LCSW Clinical Social Worker [facility] Desk#: [phone] Cell#: [phone]

## 2020-10-03 ENCOUNTER — Other Ambulatory Visit: Payer: Self-pay | Admitting: Orthopedic Surgery

## 2020-10-03 ENCOUNTER — Other Ambulatory Visit (HOSPITAL_COMMUNITY): Payer: Self-pay

## 2020-10-03 NOTE — Progress Notes (Signed)
Paramedicine Encounter    Patient ID: Steven Chase, male    DOB: 25-May-1986, 34 y.o.   MRN: [id number]   Patient Care Team: Corwin Levins, MD as PCP - General (Internal Medicine) Nahser, Deloris Ping, MD as PCP - Cardiology (Cardiology) Burna Sis, LCSW as Social Worker (Licensed Clinical Social Worker)  Patient Active Problem List   Diagnosis Date Noted  . Prediabetes 08/05/2020  . Elevated liver enzymes 02/06/2020  . CKD (chronic kidney disease) stage 3, GFR 30-59 ml/min (HCC) 02/06/2020  . Vitamin D deficiency 02/06/2020  . Dyslipidemia (high LDL; low HDL) 02/06/2020  . Hypertriglyceridemia 02/06/2020  . Morbid obesity (HCC) 02/05/2020  . Acute gout of left ankle 01/04/2020  . AKI (acute kidney injury) (HCC) 11/10/2019  . Acute exacerbation of CHF (congestive heart failure) (HCC) 07/15/2019  . Lipodystrophy 10/21/2018  . Chronic systolic heart failure (HCC) 02/17/2018  . Fatigue 02/17/2018  . Cardiomyopathy (HCC) 02/11/2018  . Hypertensive crisis 02/04/2018  . Chest pain 02/04/2018  . Allergic rhinitis 01/21/2018  . Patellar tendonitis of left knee 12/02/2017  . Preventative health care 12/10/2016  . Obesity 05/25/2016  . Lower leg edema 12/12/2014  . Essential hypertension 11/05/2014  . OSA (obstructive sleep apnea) 11/05/2014    Current Outpatient Medications:  .  acetaminophen (TYLENOL) 500 MG tablet, Take 500 mg by mouth every 6 (six) hours as needed for moderate pain or headache., Disp: , Rfl:  .  allopurinol (ZYLOPRIM) 100 MG tablet, Take 1 tablet by mouth twice daily (Patient taking differently: Take 100 mg by mouth 2 (two) times daily. ), Disp: 60 tablet, Rfl: 0 .  carvedilol (COREG) 25 MG tablet, TAKE 1 TABLET BY MOUTH TWICE DAILY WITH A MEAL (Patient taking differently: Take 25 mg by mouth 2 (two) times daily with a meal. ), Disp: 60 tablet, Rfl: 3 .  colchicine 0.6 MG tablet, Take 0.6 mg by mouth daily as needed (gout flare).  (Patient not taking: Reported on  09/18/2020), Disp: , Rfl:  .  fenofibrate 160 MG tablet, Take 1 tablet by mouth once daily (Patient taking differently: Take 160 mg by mouth daily. ), Disp: 90 tablet, Rfl: 0 .  hydrALAZINE (APRESOLINE) 100 MG tablet, Take 1 tablet (100 mg total) by mouth every 8 (eight) hours., Disp: 90 tablet, Rfl: 11 .  isosorbide mononitrate (IMDUR) 60 MG 24 hr tablet, Take 1.5 tablets (90 mg total) by mouth daily., Disp: 45 tablet, Rfl: 3 .  omega-3 acid ethyl esters (LOVAZA) 1 g capsule, Take 2 capsules (2 g total) by mouth 2 (two) times daily., Disp: 120 capsule, Rfl: 11 .  potassium chloride SA (KLOR-CON) 20 MEQ tablet, Take 2 tablets (40 mEq total) by mouth in the morning AND 1 tablet (20 mEq total) daily with lunch AND 2 tablets (40 mEq total) every evening., Disp: 150 tablet, Rfl: 3 .  rosuvastatin (CRESTOR) 40 MG tablet, Take 1 tablet (40 mg total) by mouth daily at 6 PM., Disp: 90 tablet, Rfl: 3 .  sacubitril-valsartan (ENTRESTO) 24-26 MG, Take 1 tablet by mouth 2 (two) times daily., Disp: 180 tablet, Rfl: 3 .  spironolactone (ALDACTONE) 25 MG tablet, Take 1/2 (one-half) tablet by mouth once daily (Patient taking differently: Take 12.5 mg by mouth daily. ), Disp: 45 tablet, Rfl: 0 .  torsemide (DEMADEX) 20 MG tablet, Take 4 tablets (80 mg total) by mouth daily., Disp: 120 tablet, Rfl: 3 No Known Allergies    Social History   Socioeconomic History  . Marital  status: Single    Spouse name: Not on file  . Number of children: 1  . Years of education: 38  . Highest education level: Not on file  Occupational History  . 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 Smoking status: Never Smoker  . Smokeless tobacco: Never Used  Vaping Use  . Vaping Use: Never used  Substance and Sexual Activity  . Alcohol use: No  . Drug use: No  . Sexual activity: Not on file  Other Topics Concern  . Not on file  Social History Narrative   Born and raised in [place], [place]. Currently  live in a private residence with his mother. Fun: Play with his daughter.   Denies religious beliefs that would effect health care.    Social Determinants of Health   Financial Resource Strain:   . Difficulty of Paying Living Expenses: Not on file  Food Insecurity:   . Worried About Programme researcher, broadcasting/film/video in the Last Year: Not on file  . Ran Out of Food in the Last Year: Not on file  Transportation Needs:   . Lack of Transportation (Medical): Not on file  . Lack of Transportation (Non-Medical): Not on file  Physical Activity:   . Days of Exercise per Week: Not on file  . Minutes of Exercise per Session: Not on file  Stress:   . Feeling of Stress : Not on file  Social Connections:   . Frequency of Communication with Friends and Family: Not on file  . Frequency of Social Gatherings with Friends and Family: Not on file  . Attends Religious Services: Not on file  . Active Member of Clubs or Organizations: Not on file  . Attends Banker Meetings: Not on file  . Marital Status: Not on file  Intimate Partner Violence:   . Fear of Current or Ex-Partner: Not on file  . Emotionally Abused: Not on file  . Physically Abused: Not on file  . Sexually Abused: Not on file    Physical Exam Cardiovascular:     Rate and Rhythm: Normal rate and regular rhythm.     Pulses: Normal pulses.  Pulmonary:     Effort: Pulmonary effort is normal.     Breath sounds: Normal breath sounds.  Musculoskeletal:        General: Normal range of motion.     Right lower leg: Edema present.     Left lower leg: Edema present.  Skin:    General: Skin is warm and dry.     Capillary Refill: Capillary refill takes less than 2 seconds.  Neurological:     Mental Status: He is alert and oriented to person, place, and time.  Psychiatric:        Mood and Affect: Mood normal.         Future Appointments  Date Time Provider Department Center  10/21/2020 12:20 PM Bensimhon, Bevelyn Buckles, MD [facility] None   02/05/2021 11:00 AM Corwin Levins, MD [facility] None    BP (!) 108/56 (BP Location: Left Arm, Patient Position: Sitting, Cuff Size: Normal)   Pulse 94   Resp 18   Wt 276 lb 12.8 oz (125.6 kg)   SpO2 90%   BMI 44.68 kg/m   Weight yesterday- did not weigh Last visit weight- 276.4 lb  Steven Chase was seen at home today and reported feeling generally well. He denied chest pain, SOB, headache, dizziness, orthopnea, fever or cough over the  past week. He stated he has been compliant with his medications over the past week and his weight has been stable. He maintained BLEE despite wearing compression stockings daily. His medications were verified and he had refilled his own pillbox. I checked his box for accuracy and noted the only mistake was failing to add a second dose of allopurinol. I brought this to his attention and he was understanding. I will follow up next week.   [name], EMT 10/03/20  ACTION: Home visit completed Next visit planned for 1 week

## 2020-10-09 ENCOUNTER — Telehealth (HOSPITAL_COMMUNITY): Payer: Self-pay

## 2020-10-09 NOTE — Telephone Encounter (Signed)
I called Mr Steven Chase to schedule an appointment. He stated he would be available Friday so we agreed to meet around lunch on that day.   [name], EMT 10/09/20

## 2020-10-11 ENCOUNTER — Telehealth (HOSPITAL_COMMUNITY): Payer: Self-pay

## 2020-10-11 NOTE — Telephone Encounter (Signed)
Mr Dettore called me to say he would not be able to keep our appointment today because he had a meeting at the same time. I asked if he could meet after the meeting and he said he would call me and let me know. I will reach out next week if I do not hear back from him.   [name], EMT 10/11/20

## 2020-10-17 ENCOUNTER — Other Ambulatory Visit: Payer: Self-pay | Admitting: Orthopedic Surgery

## 2020-10-17 ENCOUNTER — Other Ambulatory Visit (HOSPITAL_COMMUNITY): Payer: Self-pay

## 2020-10-17 ENCOUNTER — Telehealth (HOSPITAL_COMMUNITY): Payer: Self-pay | Admitting: *Deleted

## 2020-10-17 NOTE — Progress Notes (Signed)
Paramedicine Encounter    Patient ID: Steven Chase, male    DOB: [birth year], 34 y.o.   MRN: [id number]   Patient Care Team: Steven Levins, MD as PCP - General (Internal Medicine) Chase, Steven Ping, MD as PCP - Cardiology (Cardiology) Steven Sis, LCSW as Social Worker (Licensed Clinical Social Worker)  Patient Active Problem List   Diagnosis Date Noted  . Prediabetes 08/05/2020  . Elevated liver enzymes 02/06/2020  . CKD (chronic kidney disease) stage 3, GFR 30-59 ml/min (HCC) 02/06/2020  . Vitamin D deficiency 02/06/2020  . Dyslipidemia (high LDL; low HDL) 02/06/2020  . Hypertriglyceridemia 02/06/2020  . Morbid obesity (HCC) 02/05/2020  . Acute gout of left ankle 01/04/2020  . AKI (acute kidney injury) (HCC) 11/10/2019  . Acute exacerbation of CHF (congestive heart failure) (HCC) 07/15/2019  . Lipodystrophy 10/21/2018  . Chronic systolic heart failure (HCC) 02/17/2018  . Fatigue 02/17/2018  . Cardiomyopathy (HCC) 02/11/2018  . Hypertensive crisis 02/04/2018  . Chest pain 02/04/2018  . Allergic rhinitis 01/21/2018  . Patellar tendonitis of left knee 12/02/2017  . Preventative health care 12/10/2016  . Obesity 05/25/2016  . Lower leg edema 12/12/2014  . Essential hypertension 11/05/2014  . OSA (obstructive sleep apnea) 11/05/2014    Current Outpatient Medications:  .  acetaminophen (TYLENOL) 500 MG tablet, Take 500 mg by mouth every 6 (six) hours as needed for moderate pain or headache., Disp: , Rfl:  .  allopurinol (ZYLOPRIM) 100 MG tablet, Take 1 tablet by mouth twice daily (Patient taking differently: Take 100 mg by mouth 2 (two) times daily.), Disp: 60 tablet, Rfl: 0 .  carvedilol (COREG) 25 MG tablet, TAKE 1 TABLET BY MOUTH TWICE DAILY WITH A MEAL (Patient taking differently: Take 25 mg by mouth 2 (two) times daily with a meal.), Disp: 60 tablet, Rfl: 3 .  fenofibrate 160 MG tablet, Take 1 tablet by mouth once daily (Patient taking differently: Take 160 mg by mouth  daily.), Disp: 90 tablet, Rfl: 0 .  hydrALAZINE (APRESOLINE) 100 MG tablet, Take 1 tablet (100 mg total) by mouth every 8 (eight) hours., Disp: 90 tablet, Rfl: 11 .  isosorbide mononitrate (IMDUR) 60 MG 24 hr tablet, Take 1.5 tablets (90 mg total) by mouth daily., Disp: 45 tablet, Rfl: 3 .  omega-3 acid ethyl esters (LOVAZA) 1 g capsule, Take 2 capsules (2 g total) by mouth 2 (two) times daily., Disp: 120 capsule, Rfl: 11 .  potassium chloride SA (KLOR-CON) 20 MEQ tablet, Take 2 tablets (40 mEq total) by mouth in the morning AND 1 tablet (20 mEq total) daily with lunch AND 2 tablets (40 mEq total) every evening., Disp: 150 tablet, Rfl: 3 .  rosuvastatin (CRESTOR) 40 MG tablet, Take 1 tablet (40 mg total) by mouth daily at 6 PM., Disp: 90 tablet, Rfl: 3 .  sacubitril-valsartan (ENTRESTO) 24-26 MG, Take 1 tablet by mouth 2 (two) times daily., Disp: 180 tablet, Rfl: 3 .  spironolactone (ALDACTONE) 25 MG tablet, Take 1/2 (one-half) tablet by mouth once daily (Patient taking differently: Take 12.5 mg by mouth daily.), Disp: 45 tablet, Rfl: 0 .  torsemide (DEMADEX) 20 MG tablet, Take 4 tablets (80 mg total) by mouth daily., Disp: 120 tablet, Rfl: 3 .  colchicine 0.6 MG tablet, Take 0.6 mg by mouth daily as needed (gout flare).  (Patient not taking: No sig reported), Disp: , Rfl:  No Known Allergies    Social History   Socioeconomic History  . Marital status: Single  Spouse name: Not on file  . Number of children: 1  . Years of education: 94  . Highest education level: Not on file  Occupational History  . 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 Smoking status: Never Smoker  . Smokeless tobacco: Never Used  Vaping Use  . Vaping Use: Never used  Substance and Sexual Activity  . Alcohol use: No  . Drug use: No  . Sexual activity: Not on file  Other Topics Concern  . Not on file  Social History Narrative   Born and raised in [place], [place]. Currently live in a  private residence with his mother. Fun: Play with his daughter.   Denies religious beliefs that would effect health care.    Social Determinants of Health   Financial Resource Strain: Not on file  Food Insecurity: Not on file  Transportation Needs: Not on file  Physical Activity: Not on file  Stress: Not on file  Social Connections: Not on file  Intimate Partner Violence: Not on file    Physical Exam Cardiovascular:     Rate and Rhythm: Normal rate and regular rhythm.     Pulses: Normal pulses.  Pulmonary:     Effort: Pulmonary effort is normal.     Breath sounds: Normal breath sounds.  Abdominal:     General: There is distension.  Musculoskeletal:        General: Normal range of motion.     Right lower leg: Edema present.     Left lower leg: Edema present.  Skin:    General: Skin is warm and dry.     Capillary Refill: Capillary refill takes less than 2 seconds.  Neurological:     Mental Status: He is alert and oriented to person, place, and time.  Psychiatric:        Mood and Affect: Mood normal.         Future Appointments  Date Time Provider Department Center  10/21/2020 12:20 PM Bensimhon, Bevelyn Buckles, MD [facility] None  02/05/2021 11:00 AM Steven Levins, MD [facility] None    BP (!) 175/101 (BP Location: Left Arm, Patient Position: Sitting, Cuff Size: Large)   Pulse 92   Resp 16   Wt 273 lb 9.6 oz (124.1 kg)   SpO2 92%   BMI 44.16 kg/m   Weight yesterday- did not weigh  Last visit weight- 276.8 lb  Steven Chase was seen at home today and reported feeling well. He denied chest pain, SOB, headache, dizziness, cough or fever or cough over the past week. He stated he has been compliant with his medications over the past week and his weight has been stable. His blood pressure was elevated however he had just taken his medications about 10 minutes prior to my arrival. His medications were verified and his pillbox was refilled. I will follow up next week.   Steven Chase, EMT 10/17/20  ACTION: Home visit completed Next visit planned for 1 week

## 2020-10-17 NOTE — Telephone Encounter (Signed)
error 

## 2020-10-21 ENCOUNTER — Encounter (HOSPITAL_COMMUNITY): Payer: 59 | Admitting: Internal Medicine

## 2020-10-22 ENCOUNTER — Telehealth (HOSPITAL_COMMUNITY): Payer: Self-pay | Admitting: Licensed Clinical Social Worker

## 2020-10-22 NOTE — Telephone Encounter (Signed)
CSW informed by [employer] that pt is unable to pay rent again this month.  CSW called pt to discuss.  Pt states that with reduced holiday hours he is past due on rent and utilities at this time.  CSW provided information for [employer], [employer], and [employer] assistance- pt to follow up on community assistance option and keep CSW updated regarding progress.  CSW encouraged pt to be in contact with landlord and attempt to pay what he is able to for rent to avoid eviction proceedings being initiated.  Will continue to follow and assist as needed  Burna Sis, LCSW Clinical Social Worker [facility] Desk#: [phone] Cell#: [phone]

## 2020-10-25 ENCOUNTER — Other Ambulatory Visit (HOSPITAL_COMMUNITY): Payer: Self-pay

## 2020-10-25 NOTE — Progress Notes (Signed)
Paramedicine Encounter    Patient ID: Steven Chase, male    DOB: 1986-06-25, 34 y.o.   MRN: [id number]   Patient Care Team: Corwin Levins, MD as PCP - General (Internal Medicine) Nahser, Deloris Ping, MD as PCP - Cardiology (Cardiology) Burna Sis, LCSW as Social Worker (Licensed Clinical Social Worker)  Patient Active Problem List   Diagnosis Date Noted  . Prediabetes 08/05/2020  . Elevated liver enzymes 02/06/2020  . CKD (chronic kidney disease) stage 3, GFR 30-59 ml/min (HCC) 02/06/2020  . Vitamin D deficiency 02/06/2020  . Dyslipidemia (high LDL; low HDL) 02/06/2020  . Hypertriglyceridemia 02/06/2020  . Morbid obesity (HCC) 02/05/2020  . Acute gout of left ankle 01/04/2020  . AKI (acute kidney injury) (HCC) 11/10/2019  . Acute exacerbation of CHF (congestive heart failure) (HCC) 07/15/2019  . Lipodystrophy 10/21/2018  . Chronic systolic heart failure (HCC) 02/17/2018  . Fatigue 02/17/2018  . Cardiomyopathy (HCC) 02/11/2018  . Hypertensive crisis 02/04/2018  . Chest pain 02/04/2018  . Allergic rhinitis 01/21/2018  . Patellar tendonitis of left knee 12/02/2017  . Preventative health care 12/10/2016  . Obesity 05/25/2016  . Lower leg edema 12/12/2014  . Essential hypertension 11/05/2014  . OSA (obstructive sleep apnea) 11/05/2014    Current Outpatient Medications:  .  acetaminophen (TYLENOL) 500 MG tablet, Take 500 mg by mouth every 6 (six) hours as needed for moderate pain or headache., Disp: , Rfl:  .  allopurinol (ZYLOPRIM) 100 MG tablet, Take 1 tablet by mouth twice daily (Patient taking differently: Take 100 mg by mouth 2 (two) times daily.), Disp: 60 tablet, Rfl: 0 .  carvedilol (COREG) 25 MG tablet, TAKE 1 TABLET BY MOUTH TWICE DAILY WITH A MEAL (Patient taking differently: Take 25 mg by mouth 2 (two) times daily with a meal.), Disp: 60 tablet, Rfl: 3 .  colchicine 0.6 MG tablet, Take 0.6 mg by mouth daily as needed (gout flare).  (Patient not taking: No sig  reported), Disp: , Rfl:  .  fenofibrate 160 MG tablet, Take 1 tablet by mouth once daily (Patient taking differently: Take 160 mg by mouth daily.), Disp: 90 tablet, Rfl: 0 .  hydrALAZINE (APRESOLINE) 100 MG tablet, Take 1 tablet (100 mg total) by mouth every 8 (eight) hours., Disp: 90 tablet, Rfl: 11 .  isosorbide mononitrate (IMDUR) 60 MG 24 hr tablet, Take 1.5 tablets (90 mg total) by mouth daily., Disp: 45 tablet, Rfl: 3 .  omega-3 acid ethyl esters (LOVAZA) 1 g capsule, Take 2 capsules (2 g total) by mouth 2 (two) times daily., Disp: 120 capsule, Rfl: 11 .  potassium chloride SA (KLOR-CON) 20 MEQ tablet, Take 2 tablets (40 mEq total) by mouth in the morning AND 1 tablet (20 mEq total) daily with lunch AND 2 tablets (40 mEq total) every evening., Disp: 150 tablet, Rfl: 3 .  rosuvastatin (CRESTOR) 40 MG tablet, Take 1 tablet (40 mg total) by mouth daily at 6 PM., Disp: 90 tablet, Rfl: 3 .  sacubitril-valsartan (ENTRESTO) 24-26 MG, Take 1 tablet by mouth 2 (two) times daily., Disp: 180 tablet, Rfl: 3 .  spironolactone (ALDACTONE) 25 MG tablet, Take 1/2 (one-half) tablet by mouth once daily (Patient taking differently: Take 12.5 mg by mouth daily.), Disp: 45 tablet, Rfl: 0 .  torsemide (DEMADEX) 20 MG tablet, Take 4 tablets (80 mg total) by mouth daily., Disp: 120 tablet, Rfl: 3 No Known Allergies    Social History   Socioeconomic History  . Marital status: Single  Spouse name: Not on file  . Number of children: 1  . Years of education: 39  . Highest education level: Not on file  Occupational History  . 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 Smoking status: Never Smoker  . Smokeless tobacco: Never Used  Vaping Use  . Vaping Use: Never used  Substance and Sexual Activity  . Alcohol use: No  . Drug use: No  . Sexual activity: Not on file  Other Topics Concern  . Not on file  Social History Narrative   Born and raised in [place], [place]. Currently live  in a private residence with his mother. Fun: Play with his daughter.   Denies religious beliefs that would effect health care.    Social Determinants of Health   Financial Resource Strain: Not on file  Food Insecurity: Not on file  Transportation Needs: Not on file  Physical Activity: Not on file  Stress: Not on file  Social Connections: Not on file  Intimate Partner Violence: Not on file    Physical Exam Cardiovascular:     Rate and Rhythm: Normal rate and regular rhythm.     Pulses: Normal pulses.  Pulmonary:     Effort: Pulmonary effort is normal.     Breath sounds: Normal breath sounds.  Musculoskeletal:        General: Normal range of motion.     Right lower leg: Edema present.     Left lower leg: Edema present.  Skin:    General: Skin is warm and dry.     Capillary Refill: Capillary refill takes less than 2 seconds.  Neurological:     Mental Status: He is alert and oriented to person, place, and time.  Psychiatric:        Mood and Affect: Mood normal.         Future Appointments  Date Time Provider Department Center  02/05/2021 11:00 AM Corwin Levins, MD [facility] None    BP (!) 100/50 (BP Location: Left Arm, Patient Position: Sitting, Cuff Size: Large)   Pulse 90   Resp 18   Wt 280 lb 12.8 oz (127.4 kg)   SpO2 94%   BMI 45.32 kg/m   Weight yesterday- 275 lb Last visit weight- 273.6 lb  Mr Eppinger was seen at home today and reported feeling generally well. He continues to have exertional dyspnea, specifically he becomes SOB when walking up any sort of incline. He denied chest pain, orthopnea, dizziness, headache, fever or cough. He reported being compliant with his medications but his weight has increased 7 lb since our visit last week. I gave him an additional 80 gm of torsemide this evening and will reassess next week. When asked about his diet he advised he has been eating several frozen dinners. We went over the sodium content of each and I should him how  to portion into serving sizes to avoid eating too much salt in one sitting. He was understanding and agreeable. I will follow up next week.   [name], EMT 10/25/20  ACTION: Home visit completed Next visit planned for 1 week

## 2020-10-27 ENCOUNTER — Other Ambulatory Visit: Payer: Self-pay | Admitting: Orthopedic Surgery

## 2020-10-28 ENCOUNTER — Telehealth (HOSPITAL_COMMUNITY): Payer: Self-pay | Admitting: Licensed Clinical Social Worker

## 2020-10-28 NOTE — Telephone Encounter (Signed)
CSW received call from pt to discuss assistance options for utilities and rent.  Went to [employer] today and was told they don't have funding currently and to come back in 2-3 weeks to attempt again.  Is planning to apply to [facility] but has not done so yet.  Is wanting options for more immediate assistance to help with his past due December rent.  CSW sent him lists for crisis assistance including several local churches who have provided assistance in the past.  Pt will reach out to different resources to inquire about potential assistance.  Will continue to follow and assist as needed  Burna Sis, LCSW Clinical Social Worker [facility] Desk#: [phone] Cell#: [phone]

## 2020-10-28 NOTE — Telephone Encounter (Signed)
Please advise 

## 2020-10-30 ENCOUNTER — Telehealth (HOSPITAL_COMMUNITY): Payer: Self-pay | Admitting: Licensed Clinical Social Worker

## 2020-10-30 ENCOUNTER — Encounter (HOSPITAL_COMMUNITY): Payer: Self-pay

## 2020-10-30 ENCOUNTER — Ambulatory Visit (HOSPITAL_COMMUNITY)
Admission: EM | Admit: 2020-10-30 | Discharge: 2020-10-30 | Disposition: A | Discharge status: Home / Self Care | Payer: 59 | Attending: Urgent Care | Admitting: Urgent Care

## 2020-10-30 DIAGNOSIS — G4733 Obstructive sleep apnea (adult) (pediatric): Secondary | ICD-10-CM | POA: Diagnosis not present

## 2020-10-30 DIAGNOSIS — Z79899 Other long term (current) drug therapy: Secondary | ICD-10-CM | POA: Insufficient documentation

## 2020-10-30 DIAGNOSIS — R059 Cough, unspecified: Secondary | ICD-10-CM | POA: Diagnosis present

## 2020-10-30 DIAGNOSIS — J069 Acute upper respiratory infection, unspecified: Secondary | ICD-10-CM | POA: Insufficient documentation

## 2020-10-30 DIAGNOSIS — I130 Hypertensive heart and chronic kidney disease with heart failure and stage 1 through stage 4 chronic kidney disease, or unspecified chronic kidney disease: Secondary | ICD-10-CM | POA: Insufficient documentation

## 2020-10-30 DIAGNOSIS — R0989 Other specified symptoms and signs involving the circulatory and respiratory systems: Secondary | ICD-10-CM | POA: Insufficient documentation

## 2020-10-30 DIAGNOSIS — N183 Chronic kidney disease, stage 3 unspecified: Secondary | ICD-10-CM | POA: Diagnosis not present

## 2020-10-30 DIAGNOSIS — U071 COVID-19: Secondary | ICD-10-CM | POA: Diagnosis not present

## 2020-10-30 DIAGNOSIS — I429 Cardiomyopathy, unspecified: Secondary | ICD-10-CM | POA: Insufficient documentation

## 2020-10-30 DIAGNOSIS — I5022 Chronic systolic (congestive) heart failure: Secondary | ICD-10-CM | POA: Insufficient documentation

## 2020-10-30 DIAGNOSIS — Z7901 Long term (current) use of anticoagulants: Secondary | ICD-10-CM | POA: Insufficient documentation

## 2020-10-30 LAB — SARS CORONAVIRUS 2 (TAT 6-24 HRS): SARS Coronavirus 2: POSITIVE — AB

## 2020-10-30 MED ORDER — CETIRIZINE HCL 10 MG PO TABS: 10.0000 mg | ORAL_TABLET | Freq: Every day | ORAL | 0 refills | Status: AC

## 2020-10-30 MED ORDER — BENZONATATE 100 MG PO CAPS
100.0000 mg | ORAL_CAPSULE | Freq: Three times a day (TID) | ORAL | 0 refills | Status: AC | PRN
Start: 2020-10-30 — End: ?

## 2020-10-30 MED ORDER — PROMETHAZINE-DM 6.25-15 MG/5ML PO SYRP: 5.0000 mL | ORAL_SOLUTION | Freq: Every evening | ORAL | 0 refills | Status: AC | PRN

## 2020-10-30 NOTE — Discharge Instructions (Signed)

## 2020-10-30 NOTE — ED Triage Notes (Signed)
Pt in with c/o dry cough and runny nose that started Sunday after he received his 2nd dose of covid vaccine  Denies N/V, diarrhea

## 2020-10-30 NOTE — Telephone Encounter (Signed)
Community Paramedic brought in [place] application for patient.  CSW submitted application and required documents to DSS for review.  Will continue to follow and assist as needed  Burna Sis, LCSW Clinical Social Worker [facility] Desk#: [phone] Cell#: [phone]

## 2020-10-30 NOTE — ED Provider Notes (Signed)
[facility]   MRN: [id number] DOB: 05-27-1986  Subjective:   Steven Chase is a 34 y.o. male presenting for 3-day history of acute onset dry cough, runny nose and malaise and fatigue.  Patient received his second Covid vaccine dose on Sunday and symptoms started the following day.  He does have a history of OSA, chronic systolic heart failure, cardiomyopathy and CKD stage III.  Has not tried medications for relief.  Denies chest pain, shortness of breath, dyspnea.  Of note, patient did spend time with the family members over the holidays prior to getting ill.  No current facility-administered medications for this encounter.  Current Outpatient Medications:  .  acetaminophen (TYLENOL) 500 MG tablet, Take 500 mg by mouth every 6 (six) hours as needed for moderate pain or headache., Disp: , Rfl:  .  allopurinol (ZYLOPRIM) 100 MG tablet, Take 1 tablet by mouth twice daily, Disp: 60 tablet, Rfl: 0 .  carvedilol (COREG) 25 MG tablet, TAKE 1 TABLET BY MOUTH TWICE DAILY WITH A MEAL (Patient taking differently: Take 25 mg by mouth 2 (two) times daily with a meal.), Disp: 60 tablet, Rfl: 3 .  colchicine 0.6 MG tablet, Take 0.6 mg by mouth daily as needed (gout flare).  (Patient not taking: No sig reported), Disp: , Rfl:  .  fenofibrate 160 MG tablet, Take 1 tablet by mouth once daily (Patient taking differently: Take 160 mg by mouth daily.), Disp: 90 tablet, Rfl: 0 .  hydrALAZINE (APRESOLINE) 100 MG tablet, Take 1 tablet (100 mg total) by mouth every 8 (eight) hours., Disp: 90 tablet, Rfl: 11 .  isosorbide mononitrate (IMDUR) 60 MG 24 hr tablet, Take 1.5 tablets (90 mg total) by mouth daily., Disp: 45 tablet, Rfl: 3 .  omega-3 acid ethyl esters (LOVAZA) 1 g capsule, Take 2 capsules (2 g total) by mouth 2 (two) times daily., Disp: 120 capsule, Rfl: 11 .  potassium chloride SA (KLOR-CON) 20 MEQ tablet, Take 2 tablets (40 mEq total) by mouth in the morning AND 1 tablet (20 mEq total)  daily with lunch AND 2 tablets (40 mEq total) every evening., Disp: 150 tablet, Rfl: 3 .  rosuvastatin (CRESTOR) 40 MG tablet, Take 1 tablet (40 mg total) by mouth daily at 6 PM., Disp: 90 tablet, Rfl: 3 .  sacubitril-valsartan (ENTRESTO) 24-26 MG, Take 1 tablet by mouth 2 (two) times daily., Disp: 180 tablet, Rfl: 3 .  spironolactone (ALDACTONE) 25 MG tablet, Take 1/2 (one-half) tablet by mouth once daily (Patient taking differently: Take 12.5 mg by mouth daily.), Disp: 45 tablet, Rfl: 0 .  torsemide (DEMADEX) 20 MG tablet, Take 4 tablets (80 mg total) by mouth daily., Disp: 120 tablet, Rfl: 3   No Known Allergies  Past Medical History:  Diagnosis Date  . CHF (congestive heart failure) (HCC)   . Edema of both lower extremities   . Gout   . High cholesterol   . Hypertension   . Obesity   . OSA (obstructive sleep apnea) 11/05/2014   02/2015 -severe -AHI 106/h      Past Surgical History:  Procedure Laterality Date  . HERNIA REPAIR    . RIGHT HEART CATH N/A 09/16/2020   Procedure: RIGHT HEART CATH;  Surgeon: Dolores Patty, MD;  Location: [facility] INVASIVE CV LAB;  Service: Cardiovascular;  Laterality: N/A;  . RIGHT/LEFT HEART CATH AND CORONARY ANGIOGRAPHY N/A 02/07/2018   Procedure: RIGHT/LEFT HEART CATH AND CORONARY ANGIOGRAPHY;  Surgeon: Marykay Lex, MD;  Location: [facility] INVASIVE CV LAB;  Service: Cardiovascular;  Laterality: N/A;    Family History  Problem Relation Age of Onset  . Hypertension Mother   . High Cholesterol Mother   . Depression Mother   . Anxiety disorder Mother   . Sleep apnea Mother   . Obesity Mother   . Heart disease Maternal Grandfather   . Hypertension Maternal Grandfather   . Hypertension Sister   . High Cholesterol Father   . High blood pressure Father   . Obesity Father     Social History   Tobacco Use  . Smoking status: Never Smoker  . Smokeless tobacco: Never Used  Vaping Use  . Vaping Use: Never used  Substance Use Topics  . Alcohol use: No   . Drug use: No    ROS   Objective:   Vitals: BP (!) 160/93 (BP Location: Left Arm)   Pulse 97   Temp 99 F (37.2 C) (Oral)   Resp 20   SpO2 94%   Physical Exam Constitutional:      General: He is not in acute distress.    Appearance: Normal appearance. He is well-developed. He is obese. He is not ill-appearing, toxic-appearing or diaphoretic.  HENT:     Head: Normocephalic and atraumatic.     Right Ear: External ear normal.     Left Ear: External ear normal.     Nose: Nose normal.     Mouth/Throat:     Mouth: Mucous membranes are moist.     Pharynx: Oropharynx is clear.  Eyes:     General: No scleral icterus.    Extraocular Movements: Extraocular movements intact.     Pupils: Pupils are equal, round, and reactive to light.  Cardiovascular:     Rate and Rhythm: Normal rate and regular rhythm.     Heart sounds: Normal heart sounds. No murmur heard. No friction rub. No gallop.   Pulmonary:     Effort: Pulmonary effort is normal. No respiratory distress.     Breath sounds: Normal breath sounds. No stridor. No wheezing, rhonchi or rales.  Neurological:     Mental Status: He is alert and oriented to person, place, and time.  Psychiatric:        Mood and Affect: Mood normal.        Behavior: Behavior normal.        Thought Content: Thought content normal.     Assessment and Plan :   PDMP not reviewed this encounter.  1. Viral URI with cough   2. Runny nose   3. Chronic systolic heart failure (HCC)     I do not suspect the patient symptoms are related to his cardiomyopathy, chronic systolic heart failure.  Will manage for viral illness such as viral URI, viral syndrome, viral rhinitis, COVID-19. Counseled patient on nature of COVID-19 including modes of transmission, diagnostic testing, management and supportive care.  Offered scripts for symptomatic relief. COVID 19 testing is pending. Counseled patient on potential for adverse effects with medications  prescribed/recommended today, ER and return-to-clinic precautions discussed, patient verbalized understanding.     Steven Bamberg, PA-C 10/30/20 1132

## 2020-10-31 ENCOUNTER — Telehealth (HOSPITAL_COMMUNITY): Payer: Self-pay

## 2020-10-31 NOTE — Telephone Encounter (Signed)
Steven Chase called me today to advise that he tested positive for COVID. He stated he was seen yesterday at urgent care and told to isolate for 10 days and so he wanted me to see him in two weeks. He stated he would be able to manage his medications and would call me if he needed anything. I will follow up in two weeks.   [name], EMT 10/31/20

## 2020-11-06 ENCOUNTER — Telehealth (HOSPITAL_COMMUNITY): Payer: Self-pay | Admitting: *Deleted

## 2020-11-06 NOTE — Telephone Encounter (Signed)
Steven Chase, paramedic with [facility] called to report pt was found deceased at home today

## 2020-11-12 ENCOUNTER — Telehealth: Payer: Self-pay | Admitting: Emergency Medicine

## 2020-11-12 NOTE — Telephone Encounter (Signed)
[facility] dropped off death certificate. Placed in Md's box to be filled out and signed. Call [phone] when ready for pick up.

## 2020-11-13 NOTE — Telephone Encounter (Signed)
Notified [facility] death certificate ready for pick up. Gave to front desk staff...Raechel Chute

## 2020-12-03 DEATH — deceased

## 2021-02-05 ENCOUNTER — Ambulatory Visit: Payer: 59 | Admitting: Internal Medicine
